# Patient Record
Sex: Male | Born: 1940 | Race: White | Hispanic: No | Marital: Married | Smoking: Former smoker
Health system: Southern US, Community
[De-identification: ages and names within clinical notes are randomized; demographics above are authoritative.]

## PROBLEM LIST (undated history)

## (undated) DIAGNOSIS — I10 Essential (primary) hypertension: Secondary | ICD-10-CM

## (undated) DIAGNOSIS — R112 Nausea with vomiting, unspecified: Secondary | ICD-10-CM

## (undated) DIAGNOSIS — Z9889 Other specified postprocedural states: Secondary | ICD-10-CM

## (undated) DIAGNOSIS — K828 Other specified diseases of gallbladder: Secondary | ICD-10-CM

## (undated) DIAGNOSIS — K219 Gastro-esophageal reflux disease without esophagitis: Secondary | ICD-10-CM

## (undated) DIAGNOSIS — Z87442 Personal history of urinary calculi: Secondary | ICD-10-CM

## (undated) DIAGNOSIS — E785 Hyperlipidemia, unspecified: Secondary | ICD-10-CM

## (undated) DIAGNOSIS — E860 Dehydration: Secondary | ICD-10-CM

## (undated) DIAGNOSIS — R0600 Dyspnea, unspecified: Secondary | ICD-10-CM

## (undated) DIAGNOSIS — R109 Unspecified abdominal pain: Secondary | ICD-10-CM

## (undated) DIAGNOSIS — E0500 Thyrotoxicosis with diffuse goiter without thyrotoxic crisis or storm: Secondary | ICD-10-CM

## (undated) DIAGNOSIS — K635 Polyp of colon: Secondary | ICD-10-CM

## (undated) DIAGNOSIS — J449 Chronic obstructive pulmonary disease, unspecified: Secondary | ICD-10-CM

## (undated) DIAGNOSIS — K5900 Constipation, unspecified: Secondary | ICD-10-CM

## (undated) DIAGNOSIS — D696 Thrombocytopenia, unspecified: Secondary | ICD-10-CM

## (undated) DIAGNOSIS — R42 Dizziness and giddiness: Secondary | ICD-10-CM

## (undated) DIAGNOSIS — M1990 Unspecified osteoarthritis, unspecified site: Secondary | ICD-10-CM

## (undated) DIAGNOSIS — A159 Respiratory tuberculosis unspecified: Secondary | ICD-10-CM

## (undated) DIAGNOSIS — M549 Dorsalgia, unspecified: Secondary | ICD-10-CM

## (undated) DIAGNOSIS — N529 Male erectile dysfunction, unspecified: Secondary | ICD-10-CM

## (undated) HISTORY — DX: Thrombocytopenia, unspecified: D69.6

## (undated) HISTORY — DX: Hyperlipidemia, unspecified: E78.5

## (undated) HISTORY — DX: Respiratory tuberculosis unspecified: A15.9

## (undated) HISTORY — PX: UPPER GASTROINTESTINAL ENDOSCOPY: SHX188

## (undated) HISTORY — DX: Male erectile dysfunction, unspecified: N52.9

## (undated) HISTORY — PX: EYE SURGERY: SHX253

## (undated) HISTORY — PX: CATARACT EXTRACTION: SUR2

## (undated) HISTORY — DX: Polyp of colon: K63.5

## (undated) HISTORY — DX: Essential (primary) hypertension: I10

## (undated) HISTORY — DX: Dorsalgia, unspecified: M54.9

---

## 1997-12-10 ENCOUNTER — Encounter: Admission: RE | Admit: 1997-12-10 | Discharge: 1998-03-10 | Payer: Self-pay | Admitting: Neurosurgery

## 1999-06-07 HISTORY — PX: SPINAL FUSION: SHX223

## 2001-07-10 ENCOUNTER — Ambulatory Visit (HOSPITAL_COMMUNITY): Admission: RE | Admit: 2001-07-10 | Discharge: 2001-07-10 | Payer: Self-pay | Admitting: Neurosurgery

## 2001-08-06 ENCOUNTER — Observation Stay (HOSPITAL_COMMUNITY): Admission: RE | Admit: 2001-08-06 | Discharge: 2001-08-07 | Payer: Self-pay | Admitting: Neurosurgery

## 2009-06-06 DIAGNOSIS — K635 Polyp of colon: Secondary | ICD-10-CM

## 2009-06-06 HISTORY — DX: Polyp of colon: K63.5

## 2009-11-11 ENCOUNTER — Encounter (INDEPENDENT_AMBULATORY_CARE_PROVIDER_SITE_OTHER): Payer: Self-pay | Admitting: *Deleted

## 2010-02-11 ENCOUNTER — Ambulatory Visit: Payer: Self-pay | Admitting: Gastroenterology

## 2010-02-11 DIAGNOSIS — I10 Essential (primary) hypertension: Secondary | ICD-10-CM | POA: Insufficient documentation

## 2010-03-06 HISTORY — PX: COLONOSCOPY: SHX174

## 2010-03-09 ENCOUNTER — Ambulatory Visit: Payer: Self-pay | Admitting: Gastroenterology

## 2010-03-15 ENCOUNTER — Encounter: Payer: Self-pay | Admitting: Gastroenterology

## 2010-07-06 NOTE — Assessment & Plan Note (Signed)
Summary: SCREEN FOR RECALL COLON/YF   History of Present Illness Visit Type: Initial Consult Primary GI MD: Melvia Heaps MD [facility] Primary Provider: Rudi Heap, MD Requesting Provider: Rudi Heap, MD Chief Complaint: Colon screening, family hx of colon cancer History of Present Illness:   [name] is a pleasant 70 year old white male referred at the request of Dr. Christell Constant for colonoscopy.  Family history is pertinent for his sister who developed colon cancer at 65.  Screening colonoscopy in 2004 demonstrated few right colon diverticula.  He has no GI complaints including abdominal pain, change of bowel habits, melena or hematochezia.   GI Review of Systems      Denies abdominal pain, acid reflux, belching, bloating, chest pain, dysphagia with liquids, dysphagia with solids, heartburn, loss of appetite, nausea, vomiting, vomiting blood, weight loss, and  weight gain.        Denies anal fissure, black tarry stools, change in bowel habit, constipation, diarrhea, diverticulosis, fecal incontinence, heme positive stool, hemorrhoids, irritable bowel syndrome, jaundice, light color stool, liver problems, rectal bleeding, and  rectal pain. Preventive Screening-Counseling & Management  Alcohol-Tobacco     Smoking Status: quit      Drug Use:  no.      Current Medications (verified): 1)  Diovan 80 Mg Tabs (Valsartan) .... Once Daily 2)  Crestor 10 Mg Tabs (Rosuvastatin Calcium) .... Once Daily 3)  Aspirin 81 Mg Tbec (Aspirin) .... Once Daily 4)  Vitamin D3 2000 Unit Caps (Cholecalciferol) .... Take 2500 International Units Daily 5)  Coq10 100 Mg Caps (Coenzyme Q10) .... Once Daily 6)  Fish Oil 1000 Mg Caps (Omega-3 Fatty Acids) .... Once Daily  Allergies (verified): 1)  ! Hydrocodone 2)  ! Codeine  Past History:  Past Medical History: Hyperlipidemia Hypertension Kidney Stones Pneumonia  Past Surgical History: Neck fusion x2  Family History: Family History of Colon  Cancer:Sister Family History of Prostate Cancer:Father, Brother  Social History: Occupation: [employer] Patient is a former smoker.  Alcohol Use - no Daily Caffeine Use Illicit Drug Use - no Smoking Status:  quit Drug Use:  no  Review of Systems       The patient complains of hearing problems.  The patient denies allergy/sinus, anemia, anxiety-new, arthritis/joint pain, back pain, blood in urine, breast changes/lumps, change in vision, confusion, cough, coughing up blood, depression-new, fainting, fatigue, fever, headaches-new, heart murmur, heart rhythm changes, itching, menstrual pain, muscle pains/cramps, night sweats, nosebleeds, pregnancy symptoms, shortness of breath, skin rash, sleeping problems, sore throat, swelling of feet/legs, swollen lymph glands, thirst - excessive , urination - excessive , urination changes/pain, urine leakage, vision changes, and voice change.         All other systems were reviewed and were negative   Vital Signs:  Patient profile:   70 year old male Height:      58 inches Weight:      171.50 pounds BMI:     35.97 Pulse rate:   68 / minute Pulse rhythm:   regular BP sitting:   124 / 80  (left arm) Cuff size:   regular  Vitals Entered By: June McMurray CMA Duncan Dull) (February 11, 2010 1:44 PM)  Physical Exam  Additional Exam:  On physical exam he is a well-developed well-nourished male  skin: anicteric HEENT: normocephalic; PEERLA; no nasal or pharyngeal abnormalities neck: supple nodes: no cervical lymphadenopathy chest: clear to ausculatation and percussion heart: no murmurs, gallops, or rubs abd: soft, nontender; BS normoactive; no abdominal masses, tenderness, organomegaly rectal:  deferred ext: no cynanosis, clubbing, edema skeletal: no deformities neuro: oriented x 3; no focal abnormalities    Impression & Recommendations:  Problem # 1:  FM HX MALIGNANT NEOPLASM GASTROINTESTINAL TRACT (ICD-V16.0)  Plan colonoscopy  Risks,  alternatives, and complications of the procedure, including bleeding, perforation, and possible need for surgery, were explained to the patient.  Patient's questions were answered.  Orders: Colonoscopy (Colon)  Problem # 2:  ESSENTIAL HYPERTENSION, BENIGN (ICD-401.1) Assessment: Comment Only  Orders: Colonoscopy (Colon)  Patient Instructions: 1)  Copy sent to : Rudi Heap, MD 2)  Your Colonoscopy is scheduled for 03/09/2010 at 3:30pm 3)  You can pick up your MoviPrep at your pharmacy 4)  Colonoscopy and Flexible Sigmoidoscopy brochure given.  5)  Conscious Sedation brochure given.  6)  The medication list was reviewed and reconciled.  All changed / newly prescribed medications were explained.  A complete medication list was provided to the patient / caregiver. Prescriptions: MOVIPREP 100 GM  SOLR (PEG-KCL-NACL-NASULF-NA ASC-C) As per prep instructions.  #1 x 0   Entered by:   Merri Ray CMA (AAMA)   Authorized by:   Louis Meckel MD   Signed by:   Merri Ray CMA (AAMA) on 02/11/2010   Method used:   Electronically to        [phone]* (retail)       [address]       [place], [place]  [address]       Ph: [phone] or [phone]       Fax: [phone]   RxID:   [id number]

## 2010-07-06 NOTE — Letter (Signed)
Summary: New Patient letter  [facility]  [address] [place], [place] [address]   Phone: [phone]  Fax: [phone]       11/11/2009 MRN: [id number]  Mario Smith [address] [place], [place]  [address]  Dear Mr. KIMES,  Welcome to the Gastroenterology Division at [facility].    You are scheduled to see Dr.  Arlyce Dice on 12-28-09 at 2pm on the 3rd floor at [facility], [address]  We ask that you try to arrive at our office 15 minutes prior to your appointment time to allow for check-in.  We would like you to complete the enclosed self-administered evaluation form prior to your visit and bring it with you on the day of your appointment.  We will review it with you.  Also, please bring a complete list of all your medications or, if you prefer, bring the medication bottles and we will list them.  Please bring your insurance card so that we may make a copy of it.  If your insurance requires a referral to see a specialist, please bring your referral form from your primary care physician.  Co-payments are due at the time of your visit and may be paid by cash, check or credit card.     Your office visit will consist of a consult with your physician (includes a physical exam), any laboratory testing he/she may order, scheduling of any necessary diagnostic testing (e.g. x-ray, ultrasound, CT-scan), and scheduling of a procedure (e.g. Endoscopy, Colonoscopy) if required.  Please allow enough time on your schedule to allow for any/all of these possibilities.    If you cannot keep your appointment, please call [phone] to cancel or reschedule prior to your appointment date.  This allows [place] the opportunity to schedule an appointment for another patient in need of care.  If you do not cancel or reschedule by 5 p.m. the business day prior to your appointment date, you will be charged a $50.00 late cancellation/no-show fee.    Thank you for choosing [facility] for your medical needs.  We appreciate the opportunity to care for you.  Please visit [place] at our website  to learn more about our practice.                     Sincerely,                                                             The Gastroenterology Division

## 2010-07-06 NOTE — Procedures (Signed)
Summary: Colonoscopy  Patient: Abas Menken Note: All result statuses are Final unless otherwise noted.  Tests: (1) Colonoscopy (COL)   COL Colonoscopy           DONE     [facility]     [address]     [place], [place]  [address]           COLONOSCOPY PROCEDURE REPORT           PATIENT:  Mario Smith, Mario Smith  MR#:  [id number]     BIRTHDATE:  12/25/1940, 69 yrs. old  GENDER:  male           ENDOSCOPIST:  Barbette Hair. Arlyce Dice, MD     Referred by:           PROCEDURE DATE:  03/09/2010     PROCEDURE:  Colonoscopy with snare polypectomy     ASA CLASS:  Class I     INDICATIONS:  1) Elevated Risk Screening  2) family history of     colon cancer Sister           MEDICATIONS:   Fentanyl 50 mcg IV, Versed 5 mg IV           DESCRIPTION OF PROCEDURE:   After the risks benefits and     alternatives of the procedure were thoroughly explained, informed     consent was obtained.  Digital rectal exam was performed and     revealed no abnormalities.   The LB CF-[id number] endoscope     was introduced through the anus and advanced to the cecum, which     was identified by the ileocecal valve, without limitations.  The     quality of the prep was excellent, using MiraLax.  The instrument     was then slowly withdrawn as the colon was fully examined.     <<PROCEDUREIMAGES>>           FINDINGS:  A sessile polyp was found in the descending colon. It     was 5 mm in size. Polyp was snared without cautery. Retrieval was     successful (see image11). snare polyp  This was otherwise a normal     examination of the colon (see image1, image2, image3, image4,     image5, image8, image10, image14, and image16).   Retroflexed     views in the rectum revealed no abnormalities.    The time to     cecum =  1.25  minutes. The scope was then withdrawn (time =  7.75     min) from the patient and the procedure completed.           COMPLICATIONS:  None           ENDOSCOPIC IMPRESSION:     1) 5 mm sessile  polyp in the descending colon     2) Otherwise normal examination     RECOMMENDATIONS:     1) Given your significant family history of colon cancer, you     should have a repeat colonoscopy in 5 years           REPEAT EXAM:  In 5 year(s) for Colonoscopy.           ______________________________     Barbette Hair. Arlyce Dice, MD           CC: Rudi Heap, MD           n.     Rosalie Doctor:   Barbette Hair.  Kaplan at 03/09/2010 04:02 PM           Kassem, Alba, [id number]  Note: An exclamation mark (!) indicates a result that was not dispersed into the flowsheet. Document Creation Date: 03/09/2010 4:04 PM _______________________________________________________________________  (1) Order result status: Final Collection or observation date-time: 03/09/2010 15:56 Requested date-time:  Receipt date-time:  Reported date-time:  Referring Physician:   Ordering Physician: Melvia Heaps [phone]) Specimen Source:  Source: Launa Grill Order Number: [id number] Lab site:   Appended Document: Colonoscopy     Procedures Next Due Date:    Colonoscopy: 03/2015

## 2010-07-06 NOTE — Letter (Signed)
Summary: Patient Notice- Polyp Results  [facility]  [address] [place], [place] [address]   Phone: [phone]  Fax: [phone]        March 15, 2010 MRN: [id number]    Mario Smith [address] [place], [place]  [address]    Dear Mr. SOLER,  I am pleased to inform you that the colon polyp(s) removed during your recent colonoscopy was (were) found to be benign (no cancer detected) upon pathologic examination.  I recommend you have a repeat colonoscopy examination in _5 years to look for recurrent polyps, as having colon polyps increases your risk for having recurrent polyps or even colon cancer in the future.  Should you develop new or worsening symptoms of abdominal pain, bowel habit changes or bleeding from the rectum or bowels, please schedule an evaluation with either your primary care physician or with me.  Additional information/recommendations:  __ No further action with gastroenterology is needed at this time. Please      follow-up with your primary care physician for your other healthcare      needs.  __ Please call [phone] to schedule a return visit to review your      situation.  __ Please keep your follow-up visit as already scheduled.  _x_ Continue treatment plan as outlined the day of your exam.  Please call us if you are having persistent problems or have questions about your condition that have not been fully answered at this time.  Sincerely,  Louis Meckel MD  This letter has been electronically signed by your physician.  Appended Document: Patient Notice- Polyp Results letter mailed

## 2010-07-06 NOTE — Letter (Signed)
Summary: [facility] Instructions  [facility]  [address] [place], [place] [address]   Phone: [phone]  Fax: [phone]       Mario Smith    1940-12-16    MRN: [id number]        Procedure Day /Date:TUESDAY 03/09/2010     Arrival Time:2:30PM     Procedure Time:3:30PM     Location of Procedure:                    X   [facility] (4th Floor)                       PREPARATION FOR COLONOSCOPY WITH MOVIPREP   Starting 5 days prior to your procedure 9/30/2011do not eat nuts, seeds, popcorn, corn, beans, peas,  salads, or any raw vegetables.  Do not take any fiber supplements (e.g. Metamucil, Citrucel, and Benefiber).  THE DAY BEFORE YOUR PROCEDURE         DATE:03/08/2010 DAY: MONDAY  1.  Drink clear liquids the entire day-NO SOLID FOOD  2.  Do not drink anything colored red or purple.  Avoid juices with pulp.  No orange juice.  3.  Drink at least 64 oz. (8 glasses) of fluid/clear liquids during the day to prevent dehydration and help the prep work efficiently.  CLEAR LIQUIDS INCLUDE: Water Jello Ice Popsicles Tea (sugar ok, no milk/cream) Powdered fruit flavored drinks Coffee (sugar ok, no milk/cream) Gatorade Juice: apple, white grape, white cranberry  Lemonade Clear bullion, consomm, broth Carbonated beverages (any kind) Strained chicken noodle soup Hard Candy                             4.  In the morning, mix first dose of MoviPrep solution:    Empty 1 Pouch A and 1 Pouch B into the disposable container    Add lukewarm drinking water to the top line of the container. Mix to dissolve    Refrigerate (mixed solution should be used within 24 hrs)  5.  Begin drinking the prep at 5:00 p.m. The MoviPrep container is divided by 4 marks.   Every 15 minutes drink the solution down to the next mark (approximately 8 oz) until the full liter is complete.   6.  Follow completed prep with 16 oz of clear liquid of your choice (Nothing red or  purple).  Continue to drink clear liquids until bedtime.  7.  Before going to bed, mix second dose of MoviPrep solution:    Empty 1 Pouch A and 1 Pouch B into the disposable container    Add lukewarm drinking water to the top line of the container. Mix to dissolve    Refrigerate  THE DAY OF YOUR PROCEDURE      DATE: 03/09/2010 [id number]  Beginning at10:30a.m. (5 hours before procedure):         1. Every 15 minutes, drink the solution down to the next mark (approx 8 oz) until the full liter is complete.  2. Follow completed prep with 16 oz. of clear liquid of your choice.    3. You may drink clear liquids until 1:30PM (2 HOURS BEFORE PROCEDURE).   MEDICATION INSTRUCTIONS  Unless otherwise instructed, you should take regular prescription medications with a small sip of water   as early as possible the morning of your procedure.         OTHER INSTRUCTIONS  You will need a responsible adult at least 71 years of age to accompany you and drive you home.   This person must remain in the waiting room during your procedure.  Wear loose fitting clothing that is easily removed.  Leave jewelry and other valuables at home.  However, you may wish to bring a book to read or  an iPod/MP3 player to listen to music as you wait for your procedure to start.  Remove all body piercing jewelry and leave at home.  Total time from sign-in until discharge is approximately 2-3 hours.  You should go home directly after your procedure and rest.  You can resume normal activities the  day after your procedure.  The day of your procedure you should not:   Drive   Make legal decisions   Operate machinery   Drink alcohol   Return to work  You will receive specific instructions about eating, activities and medications before you leave.    The above instructions have been reviewed and explained to me by   _______________________    I fully understand and can verbalize these  instructions _____________________________ Date _________

## 2010-09-09 ENCOUNTER — Encounter: Payer: Self-pay | Admitting: Family Medicine

## 2010-09-29 ENCOUNTER — Other Ambulatory Visit: Payer: Self-pay | Admitting: Family Medicine

## 2010-09-29 DIAGNOSIS — R591 Generalized enlarged lymph nodes: Secondary | ICD-10-CM

## 2010-09-29 DIAGNOSIS — R599 Enlarged lymph nodes, unspecified: Secondary | ICD-10-CM

## 2010-10-01 ENCOUNTER — Ambulatory Visit
Admission: RE | Admit: 2010-10-01 | Discharge: 2010-10-01 | Disposition: A | Discharge status: Home / Self Care | Payer: Medicare Other | Source: Ambulatory Visit | Attending: Family Medicine | Admitting: Family Medicine

## 2010-10-01 DIAGNOSIS — R599 Enlarged lymph nodes, unspecified: Secondary | ICD-10-CM

## 2010-10-01 DIAGNOSIS — R591 Generalized enlarged lymph nodes: Secondary | ICD-10-CM

## 2010-10-01 MED ORDER — IOHEXOL 300 MG/ML  SOLN
75.0000 mL | Freq: Once | INTRAMUSCULAR | Status: AC | PRN
  Administered 2010-10-01: 75 mL via INTRAVENOUS

## 2012-10-01 ENCOUNTER — Other Ambulatory Visit (INDEPENDENT_AMBULATORY_CARE_PROVIDER_SITE_OTHER): Payer: Medicare Other

## 2012-10-01 DIAGNOSIS — R972 Elevated prostate specific antigen [PSA]: Secondary | ICD-10-CM

## 2012-10-01 LAB — PSA: PSA: 1.96 ng/mL (ref <=4.00)

## 2012-10-01 NOTE — Progress Notes (Signed)
Patient came in for blood work for Dr.Wrenn

## 2012-10-09 ENCOUNTER — Telehealth: Payer: Self-pay | Admitting: Family Medicine

## 2012-10-09 MED ORDER — ROSUVASTATIN CALCIUM 20 MG PO TABS: 10.0000 mg | ORAL_TABLET | Freq: Every day | ORAL | Status: DC

## 2012-10-09 NOTE — Telephone Encounter (Signed)
Samples up front and patient aware 

## 2012-11-30 ENCOUNTER — Telehealth: Payer: Self-pay | Admitting: Family Medicine

## 2012-11-30 MED ORDER — ROSUVASTATIN CALCIUM 20 MG PO TABS: 10.0000 mg | ORAL_TABLET | Freq: Every day | ORAL | Status: DC

## 2012-11-30 NOTE — Telephone Encounter (Signed)
Pt notified samples crestor @front desk 

## 2012-12-12 ENCOUNTER — Other Ambulatory Visit (INDEPENDENT_AMBULATORY_CARE_PROVIDER_SITE_OTHER): Payer: Medicare Other

## 2012-12-12 DIAGNOSIS — Z79899 Other long term (current) drug therapy: Secondary | ICD-10-CM

## 2012-12-12 DIAGNOSIS — R5381 Other malaise: Secondary | ICD-10-CM

## 2012-12-12 DIAGNOSIS — E559 Vitamin D deficiency, unspecified: Secondary | ICD-10-CM

## 2012-12-12 DIAGNOSIS — E785 Hyperlipidemia, unspecified: Secondary | ICD-10-CM

## 2012-12-12 DIAGNOSIS — R5383 Other fatigue: Secondary | ICD-10-CM

## 2012-12-12 LAB — BASIC METABOLIC PANEL WITH GFR
Chloride: 106 mEq/L (ref 96–112)
GFR, Est African American: 81 mL/min
GFR, Est Non African American: 70 mL/min
Potassium: 4.8 mEq/L (ref 3.5–5.3)
Sodium: 139 mEq/L (ref 135–145)

## 2012-12-12 LAB — POCT CBC
Granulocyte percent: 58.5 %G (ref 37–80)
POC Granulocyte: 3.4 (ref 2–6.9)
POC LYMPH PERCENT: 32.1 %L (ref 10–50)
Platelet Count, POC: 274 10*3/uL (ref 142–424)
RBC: 4.7 M/uL (ref 4.69–6.13)
RDW, POC: 13.6 %

## 2012-12-12 LAB — HEPATIC FUNCTION PANEL
Albumin: 4.2 g/dL (ref 3.5–5.2)
Alkaline Phosphatase: 56 U/L (ref 39–117)
Total Protein: 6.4 g/dL (ref 6.0–8.3)

## 2012-12-13 LAB — NMR LIPOPROFILE WITH LIPIDS
HDL Size: 9.1 nm — ABNORMAL LOW (ref >=9.2)
HDL-C: 34 mg/dL — ABNORMAL LOW (ref >=40)
LDL (calc): 68 mg/dL (ref <100)
LDL Particle Number: 903 nmol/L (ref <1000)
LDL Size: 19.9 nm — ABNORMAL LOW (ref >20.5)
LP-IR Score: 52 — ABNORMAL HIGH (ref <=45)
Triglycerides: 73 mg/dL (ref <150)
VLDL Size: 49.2 nm — ABNORMAL HIGH (ref <=46.6)

## 2012-12-13 LAB — VITAMIN D 25 HYDROXY (VIT D DEFICIENCY, FRACTURES): Vit D, 25-Hydroxy: 62 ng/mL (ref 30–89)

## 2012-12-19 ENCOUNTER — Ambulatory Visit (INDEPENDENT_AMBULATORY_CARE_PROVIDER_SITE_OTHER): Payer: Medicare Other | Admitting: Family Medicine

## 2012-12-19 ENCOUNTER — Encounter: Payer: Self-pay | Admitting: Family Medicine

## 2012-12-19 VITALS — BP 152/77 | HR 59 | Temp 98.0°F | Ht 68.0 in | Wt 173.0 lb

## 2012-12-19 DIAGNOSIS — E559 Vitamin D deficiency, unspecified: Secondary | ICD-10-CM

## 2012-12-19 DIAGNOSIS — E785 Hyperlipidemia, unspecified: Secondary | ICD-10-CM

## 2012-12-19 DIAGNOSIS — I10 Essential (primary) hypertension: Secondary | ICD-10-CM

## 2012-12-19 DIAGNOSIS — R5381 Other malaise: Secondary | ICD-10-CM

## 2012-12-19 DIAGNOSIS — R5383 Other fatigue: Secondary | ICD-10-CM

## 2012-12-19 NOTE — Patient Instructions (Addendum)
Fall precautions discussed Continue current meds and therapeutic lifestyle changes 

## 2012-12-19 NOTE — Progress Notes (Signed)
  Subjective:    Patient ID: Mario Smith, male    DOB: 04/29/1941, 72 y.o.   MRN: [id number]  HPI Patient returns to clinic today for followup of chronic medical problems which include hypertension, hyperlipidemia. As of note the patient has had some elevated PSAs which have subsequently returned back to a more normal range. He saw Dr. Annabell Howells, and he has released him. His home blood pressure readings both morning and evening are all good and I will be scanned into the chart. Patient does complain of some edema day he has had for years which is always worse in the summertime. He also complains of an occasional shaky spell in the morning which seems to be relieved by eating.   Review of Systems  Constitutional: Positive for fatigue (some).  HENT: Positive for postnasal drip (occasional due to allergies).   Eyes: Positive for itching (due to allergies).  Respiratory: Positive for cough (slight) and shortness of breath (occasional with exertion).   Cardiovascular: Positive for leg swelling. Negative for chest pain.  Gastrointestinal: Negative.   Endocrine: Negative.   Musculoskeletal: Positive for back pain (LBP) and arthralgias (L shoulder, R hip).  Skin: Negative.   Allergic/Immunologic: Positive for environmental allergies (seasonal).  Neurological: Positive for headaches (occasional). Negative for dizziness.  Psychiatric/Behavioral: Negative.        Objective:   Physical Exam BP 152/77  Pulse 59  Temp(Src) 98 F (36.7 C) (Oral)  Ht 5\' 8"  (1.727 m)  Wt 173 lb (78.472 kg)  BMI 26.31 kg/m2  The patient appeared well nourished and normally developed, alert and oriented to time and place. Speech, behavior and judgement appear normal. Vital signs as documented.  Head exam is unremarkable. No scleral icterus or pallor noted. There is nasal congestion bilaterally. Mouth, throat and ears were within normal limit. Neck is without jugular venous distension, thyromegally, or carotid bruits.  Carotid upstrokes are brisk bilaterally. No cervical adenopathy. Lungs are clear anteriorly and posteriorly to auscultation. Normal respiratory effort. Cardiac exam reveals regular rate and rhythm at 72 per minute. First and second heart sounds normal.  No murmurs, rubs or gallops.  Abdominal exam reveals normal bowl sounds, no masses, no organomegaly and no aortic enlargement. No inguinal adenopathy. Extremities are nonedematous, except slightly above the sock line, and both femoral and pedal pulses are normal. Skin without pallor or jaundice.  Warm and dry, without rash. Neurologic exam reveals normal deep tendon reflexes and normal sensation.          Assessment & Plan:  1. Hyperlipemia  2. Hypertension  3. Fatigue  4. Vitamin D deficiency  We will continue to monitor the edema. We will also continue to watch these shaky spells that you have in the morning  Check weights regularly  Patient Instructions  Fall precautions discussed Continue current meds and therapeutic lifestyle changes   Nyra Capes MD

## 2013-02-18 ENCOUNTER — Telehealth: Payer: Self-pay | Admitting: Family Medicine

## 2013-02-19 NOTE — Telephone Encounter (Signed)
Patient is aware samples up front

## 2013-03-25 ENCOUNTER — Other Ambulatory Visit: Payer: Self-pay | Admitting: Family Medicine

## 2013-03-27 ENCOUNTER — Ambulatory Visit (INDEPENDENT_AMBULATORY_CARE_PROVIDER_SITE_OTHER): Payer: Medicare Other | Admitting: *Deleted

## 2013-03-27 DIAGNOSIS — Z23 Encounter for immunization: Secondary | ICD-10-CM

## 2013-04-19 ENCOUNTER — Telehealth: Payer: Self-pay | Admitting: Family Medicine

## 2013-04-22 MED ORDER — ROSUVASTATIN CALCIUM 20 MG PO TABS: 10.0000 mg | ORAL_TABLET | Freq: Every day | ORAL | Status: DC

## 2013-04-22 NOTE — Telephone Encounter (Signed)
Left message that samples are available up front.

## 2013-05-20 ENCOUNTER — Other Ambulatory Visit (INDEPENDENT_AMBULATORY_CARE_PROVIDER_SITE_OTHER): Payer: Medicare Other

## 2013-05-20 DIAGNOSIS — E559 Vitamin D deficiency, unspecified: Secondary | ICD-10-CM

## 2013-05-20 DIAGNOSIS — R5381 Other malaise: Secondary | ICD-10-CM

## 2013-05-20 DIAGNOSIS — E785 Hyperlipidemia, unspecified: Secondary | ICD-10-CM

## 2013-05-20 DIAGNOSIS — I10 Essential (primary) hypertension: Secondary | ICD-10-CM

## 2013-05-20 LAB — POCT CBC
Hemoglobin: 15 g/dL (ref 14.1–18.1)
Lymph, poc: 2.1 (ref 0.6–3.4)
MCH, POC: 30.5 pg (ref 27–31.2)
MCHC: 32.5 g/dL (ref 31.8–35.4)
MPV: 9.4 fL (ref 0–99.8)
POC LYMPH PERCENT: 40.1 %L (ref 10–50)
RDW, POC: 13.8 %
WBC: 5.3 10*3/uL (ref 4.6–10.2)

## 2013-05-20 NOTE — Progress Notes (Signed)
Patient came in for labs only.

## 2013-05-22 LAB — BMP8+EGFR
Calcium: 9.3 mg/dL (ref 8.6–10.2)
GFR calc Af Amer: 77 mL/min/{1.73_m2} (ref >59)
GFR calc non Af Amer: 67 mL/min/{1.73_m2} (ref >59)
Potassium: 5.2 mmol/L (ref 3.5–5.2)
Sodium: 145 mmol/L — ABNORMAL HIGH (ref 134–144)

## 2013-05-22 LAB — NMR, LIPOPROFILE
HDL Cholesterol by NMR: 43 mg/dL (ref >=40)
LDL Particle Number: 1136 nmol/L — ABNORMAL HIGH (ref <1000)
Triglycerides by NMR: 66 mg/dL (ref <150)

## 2013-05-22 LAB — HEPATITIS PANEL, ACUTE: Hep C Virus Ab: <0.1 s/co ratio (ref 0.0–0.9)

## 2013-05-23 LAB — HFP7+3AC
Albumin: 4.2 g/dL (ref 3.5–4.8)
Alkaline Phosphatase: 77 IU/L (ref 39–117)
Bilirubin, Direct: 0.11 mg/dL (ref 0.00–0.40)
Total Protein: 6.2 g/dL (ref 6.0–8.5)

## 2013-05-27 ENCOUNTER — Encounter: Payer: Self-pay | Admitting: Family Medicine

## 2013-05-27 ENCOUNTER — Ambulatory Visit (INDEPENDENT_AMBULATORY_CARE_PROVIDER_SITE_OTHER): Payer: Medicare Other

## 2013-05-27 ENCOUNTER — Ambulatory Visit (INDEPENDENT_AMBULATORY_CARE_PROVIDER_SITE_OTHER): Payer: Medicare Other | Admitting: Family Medicine

## 2013-05-27 VITALS — BP 147/74 | HR 76 | Temp 98.1°F | Ht 68.0 in | Wt 172.0 lb

## 2013-05-27 DIAGNOSIS — Z23 Encounter for immunization: Secondary | ICD-10-CM

## 2013-05-27 DIAGNOSIS — I10 Essential (primary) hypertension: Secondary | ICD-10-CM

## 2013-05-27 DIAGNOSIS — R972 Elevated prostate specific antigen [PSA]: Secondary | ICD-10-CM

## 2013-05-27 DIAGNOSIS — E559 Vitamin D deficiency, unspecified: Secondary | ICD-10-CM

## 2013-05-27 DIAGNOSIS — E785 Hyperlipidemia, unspecified: Secondary | ICD-10-CM

## 2013-05-27 NOTE — Progress Notes (Signed)
Subjective:    Patient ID: Mario Smith, male    DOB: 10-Jan-1941, 72 y.o.   MRN: [id number]  HPI Pt here for follow up and management of chronic medical problems. His health maintenance parameters are up to date except for getting a Prevnar vaccine. His labs will be reviewed with him today. He will be given FOBT to return in January. He'll also get a chest x-ray today.        Patient Active Problem List   Diagnosis Date Noted  . Hypertension 02/11/2010   Outpatient Encounter Prescriptions as of 05/27/2013  Medication Sig  . aspirin 81 MG EC tablet Take 81 mg by mouth daily.    . Cholecalciferol (VITAMIN D3) 2000 UNITS TABS Take 1 tablet by mouth daily.    . Coenzyme Q10 (CO Q 10 PO) Take 1 capsule by mouth daily.    Marland Kitchen losartan (COZAAR) 100 MG tablet TAKE 1 TABLET BY MOUTH EVERY DAY AS DIRECTED  . Omega-3 Fatty Acids (FISH OIL) 1000 MG CAPS Take 1 capsule by mouth 2 (two) times daily.    . rosuvastatin (CRESTOR) 20 MG tablet Take 0.5 tablets (10 mg total) by mouth daily.    Review of Systems  Constitutional: Negative.   HENT: Negative.   Eyes: Negative.   Respiratory: Negative.   Cardiovascular: Negative.   Gastrointestinal: Negative.   Endocrine: Negative.   Genitourinary: Negative.   Musculoskeletal: Negative.   Skin: Negative.   Allergic/Immunologic: Negative.   Neurological: Negative.   Hematological: Negative.   Psychiatric/Behavioral: Negative.        Objective:   Physical Exam  Nursing note and vitals reviewed. Constitutional: He is oriented to person, place, and time. He appears well-developed and well-nourished. No distress.  HENT:  Head: Normocephalic and atraumatic.  Right Ear: External ear normal.  Left Ear: External ear normal.  Nose: Nose normal.  Mouth/Throat: Oropharynx is clear and moist. No oropharyngeal exudate.  Some nasal congestion bilaterally  Eyes: Conjunctivae and EOM are normal. Pupils are equal, round, and reactive to light. Right eye  exhibits no discharge. Left eye exhibits no discharge. No scleral icterus.  Neck: Normal range of motion. Neck supple. No tracheal deviation present. No thyromegaly present.  Cardiovascular: Normal rate, regular rhythm, normal heart sounds and intact distal pulses.  Exam reveals no gallop and no friction rub.   No murmur heard. At 72 per minute  Pulmonary/Chest: Effort normal and breath sounds normal. No respiratory distress. He has no wheezes. He has no rales. He exhibits no tenderness.  Abdominal: Soft. Bowel sounds are normal. He exhibits no mass. There is no tenderness. There is no rebound and no guarding.  Musculoskeletal: Normal range of motion. He exhibits no edema and no tenderness.  Lymphadenopathy:    He has no cervical adenopathy.  Neurological: He is alert and oriented to person, place, and time. He has normal reflexes. No cranial nerve deficit.  Skin: Skin is warm and dry. No rash noted. No erythema. No pallor.  Psychiatric: He has a normal mood and affect. His behavior is normal. Judgment and thought content normal.   BP 147/74  Pulse 76  Temp(Src) 98.1 F (36.7 C) (Oral)  Ht 5\' 8"  (1.727 m)  Wt 172 lb (78.019 kg)  BMI 26.16 kg/m2  [facility] reading (PRIMARY) by  Dr. Christell Constant; chest x-ray--no active disease  Assessment & Plan:  1. Hypertension - DG Chest 2 View; Future  2. Hyperlipidemia  3. Vitamin D deficiency  4. Elevated PSA - PSA, total and free  Orders Placed This Encounter  Procedures  . DG Chest 2 View    Standing Status: Future     Number of Occurrences: 1     Standing Expiration Date: 07/27/2014    Order Specific Question:  Reason for Exam (SYMPTOM  OR DIAGNOSIS REQUIRED)    Answer:  htn    Order Specific Question:  Preferred imaging location?    Answer:  Internal  . PSA, total and free   No orders of the defined types were placed in this encounter.   Patient Instructions  Continue current medications. Continue  good therapeutic lifestyle changes which include good diet and exercise. Fall precautions discussed with patient. Schedule your flu vaccine if you haven't had it yet If you are over 78 years old - you may need Prevnar 13 or the adult Pneumonia vaccine. We will call you with the results of the PSA once that lab work is available. We will give you the Prevnar vaccine today and that may make her arms sore.   Nyra Capes MD

## 2013-05-27 NOTE — Patient Instructions (Addendum)
Continue current medications. Continue good therapeutic lifestyle changes which include good diet and exercise. Fall precautions discussed with patient. Schedule your flu vaccine if you haven't had it yet If you are over 73 years old - you may need Prevnar 13 or the adult Pneumonia vaccine. We will call you with the results of the PSA once that lab work is available. We will give you the Prevnar vaccine today and that may make her arms sore.

## 2013-05-27 NOTE — Addendum Note (Signed)
Addended by: Bearl Mulberry on: 05/27/2013 05:26 PM   Modules accepted: Orders

## 2013-05-28 LAB — PSA, TOTAL AND FREE
PSA, Free Pct: 18.2 %
PSA, Free: 0.4 ng/mL
PSA: 2.2 ng/mL (ref 0.0–4.0)

## 2013-06-03 ENCOUNTER — Other Ambulatory Visit: Payer: Self-pay | Admitting: Family Medicine

## 2013-06-03 ENCOUNTER — Other Ambulatory Visit: Payer: Medicare Other

## 2013-06-03 ENCOUNTER — Ambulatory Visit (INDEPENDENT_AMBULATORY_CARE_PROVIDER_SITE_OTHER): Payer: Medicare Other

## 2013-06-03 DIAGNOSIS — Z09 Encounter for follow-up examination after completed treatment for conditions other than malignant neoplasm: Secondary | ICD-10-CM

## 2013-06-03 DIAGNOSIS — I10 Essential (primary) hypertension: Secondary | ICD-10-CM

## 2013-06-12 ENCOUNTER — Telehealth: Payer: Self-pay | Admitting: Family Medicine

## 2013-06-12 NOTE — Telephone Encounter (Signed)
No samples 

## 2013-06-19 ENCOUNTER — Telehealth: Payer: Self-pay | Admitting: Family Medicine

## 2013-06-19 NOTE — Telephone Encounter (Signed)
Pt notified samples at front to pickup of crestor.

## 2013-07-02 ENCOUNTER — Other Ambulatory Visit: Payer: Self-pay | Admitting: Family Medicine

## 2013-08-14 ENCOUNTER — Telehealth: Payer: Self-pay | Admitting: Family Medicine

## 2013-08-14 MED ORDER — ROSUVASTATIN CALCIUM 20 MG PO TABS: 10.0000 mg | ORAL_TABLET | Freq: Every day | ORAL | Status: DC

## 2013-08-14 NOTE — Telephone Encounter (Signed)
Patient aware that samples are up front.  

## 2013-09-02 ENCOUNTER — Ambulatory Visit (INDEPENDENT_AMBULATORY_CARE_PROVIDER_SITE_OTHER): Payer: Medicare Other

## 2013-09-02 ENCOUNTER — Encounter: Payer: Self-pay | Admitting: General Practice

## 2013-09-02 ENCOUNTER — Ambulatory Visit (INDEPENDENT_AMBULATORY_CARE_PROVIDER_SITE_OTHER): Payer: Medicare Other | Admitting: General Practice

## 2013-09-02 VITALS — BP 163/80 | HR 80 | Temp 97.3°F | Ht 68.0 in | Wt 174.6 lb

## 2013-09-02 DIAGNOSIS — M545 Low back pain, unspecified: Secondary | ICD-10-CM

## 2013-09-02 DIAGNOSIS — R0781 Pleurodynia: Secondary | ICD-10-CM

## 2013-09-02 DIAGNOSIS — R079 Chest pain, unspecified: Secondary | ICD-10-CM

## 2013-09-02 DIAGNOSIS — IMO0002 Reserved for concepts with insufficient information to code with codable children: Secondary | ICD-10-CM

## 2013-09-02 MED ORDER — METHYLPREDNISOLONE ACETATE 80 MG/ML IJ SUSP
80.0000 mg | Freq: Once | INTRAMUSCULAR | Status: AC
  Administered 2013-09-02: 80 mg via INTRAMUSCULAR

## 2013-09-02 MED ORDER — PREDNISONE (PAK) 10 MG PO TABS: ORAL_TABLET | ORAL | Status: DC

## 2013-09-02 NOTE — Progress Notes (Signed)
   Subjective:    Patient ID: Mario Smith, male    DOB: 08-17-1940, 73 y.o.   MRN: [id number]  Back Pain This is a new problem. Episode onset: onset 1 month ago. The problem has been gradually worsening since onset. The pain is present in the lumbar spine. The quality of the pain is described as aching. The pain does not radiate. The pain is at a severity of 3/10. The symptoms are aggravated by bending. Pertinent negatives include no bladder incontinence, bowel incontinence, leg pain, numbness, tingling or weakness. He has tried NSAIDs for the symptoms.      Review of Systems  Gastrointestinal: Negative for bowel incontinence.  Genitourinary: Negative for bladder incontinence.  Musculoskeletal: Positive for back pain.  Neurological: Negative for tingling, weakness and numbness.       Objective:   Physical Exam  Constitutional: He is oriented to person, place, and time. He appears well-developed and well-nourished.  Cardiovascular: Normal rate, regular rhythm and normal heart sounds.   Pulmonary/Chest: Effort normal and breath sounds normal. No respiratory distress. He exhibits no tenderness.  Abdominal: Soft. Bowel sounds are normal. He exhibits no distension. There is no tenderness.  Musculoskeletal:  Limited range of motion, without discomfort when flexion at waist  Neurological: He is alert and oriented to person, place, and time.  Skin: Skin is warm and dry.  Psychiatric: He has a normal mood and affect.     [facility] reading (PRIMARY) by Erby Pian, FNP-C, degenerative disc changes, lumbar xray. No acute changes noted in chest xray.      Assessment & Plan:  1. Low back pain  - DG Lumbar Spine Complete; Future - methylPREDNISolone acetate (DEPO-MEDROL) injection 80 mg; Inject 1 mL (80 mg total) into the muscle once. - predniSONE (STERAPRED UNI-PAK) 10 MG tablet; Start on 09/03/13  Dispense: 21 tablet; Refill: 0  2. Rib pain on right side  - DG Chest 2 View;  Future  3. Degenerative disc disease -discussed and provided information on back pain -RTO if symptoms worsen or no improvement -Will refer to ortho -Patient verbalized understanding Erby Pian, FNP-C

## 2013-09-02 NOTE — Patient Instructions (Signed)
Back Pain, Adult Low back pain is very common. About 1 in 5 people have back pain.The cause of low back pain is rarely dangerous. The pain often gets better over time.About half of people with a sudden onset of back pain feel better in just 2 weeks. About 8 in 10 people feel better by 6 weeks.  CAUSES Some common causes of back pain include:  Strain of the muscles or ligaments supporting the spine.  Wear and tear (degeneration) of the spinal discs.  Arthritis.  Direct injury to the back. DIAGNOSIS Most of the time, the direct cause of low back pain is not known.However, back pain can be treated effectively even when the exact cause of the pain is unknown.Answering your caregiver's questions about your overall health and symptoms is one of the most accurate ways to make sure the cause of your pain is not dangerous. If your caregiver needs more information, he or she may order lab work or imaging tests (X-rays or MRIs).However, even if imaging tests show changes in your back, this usually does not require surgery. HOME CARE INSTRUCTIONS For many people, back pain returns.Since low back pain is rarely dangerous, it is often a condition that people can learn to manageon their own.   Remain active. It is stressful on the back to sit or stand in one place. Do not sit, drive, or stand in one place for more than 30 minutes at a time. Take short walks on level surfaces as soon as pain allows.Try to increase the length of time you walk each day.  Do not stay in bed.Resting more than 1 or 2 days can delay your recovery.  Do not avoid exercise or work.Your body is made to move.It is not dangerous to be active, even though your back may hurt.Your back will likely heal faster if you return to being active before your pain is gone.  Pay attention to your body when you bend and lift. Many people have less discomfortwhen lifting if they bend their knees, keep the load close to their bodies,and  avoid twisting. Often, the most comfortable positions are those that put less stress on your recovering back.  Find a comfortable position to sleep. Use a firm mattress and lie on your side with your knees slightly bent. If you lie on your back, put a pillow under your knees.  Only take over-the-counter or prescription medicines as directed by your caregiver. Over-the-counter medicines to reduce pain and inflammation are often the most helpful.Your caregiver may prescribe muscle relaxant drugs.These medicines help dull your pain so you can more quickly return to your normal activities and healthy exercise.  Put ice on the injured area.  Put ice in a plastic bag.  Place a towel between your skin and the bag.  Leave the ice on for 15-20 minutes, 03-04 times a day for the first 2 to 3 days. After that, ice and heat may be alternated to reduce pain and spasms.  Ask your caregiver about trying back exercises and gentle massage. This may be of some benefit.  Avoid feeling anxious or stressed.Stress increases muscle tension and can worsen back pain.It is important to recognize when you are anxious or stressed and learn ways to manage it.Exercise is a great option. SEEK MEDICAL CARE IF:  You have pain that is not relieved with rest or medicine.  You have pain that does not improve in 1 week.  You have new symptoms.  You are generally not feeling well. SEEK   IMMEDIATE MEDICAL CARE IF:   You have pain that radiates from your back into your legs.  You develop new bowel or bladder control problems.  You have unusual weakness or numbness in your arms or legs.  You develop nausea or vomiting.  You develop abdominal pain.  You feel faint. Document Released: 05/23/2005 Document Revised: 11/22/2011 Document Reviewed: 10/11/2010 [employer] Patient Information 2014 [employer].  

## 2013-09-18 ENCOUNTER — Other Ambulatory Visit (INDEPENDENT_AMBULATORY_CARE_PROVIDER_SITE_OTHER): Payer: Medicare Other

## 2013-09-18 DIAGNOSIS — E785 Hyperlipidemia, unspecified: Secondary | ICD-10-CM

## 2013-09-18 DIAGNOSIS — I10 Essential (primary) hypertension: Secondary | ICD-10-CM

## 2013-09-18 DIAGNOSIS — E559 Vitamin D deficiency, unspecified: Secondary | ICD-10-CM

## 2013-09-18 LAB — POCT CBC
GRANULOCYTE PERCENT: 65.6 % (ref 37–80)
HEMATOCRIT: 48 % (ref 43.5–53.7)
HEMOGLOBIN: 15.6 g/dL (ref 14.1–18.1)
Lymph, poc: 1.8 (ref 0.6–3.4)
MCH, POC: 30.2 pg (ref 27–31.2)
MCHC: 32.5 g/dL (ref 31.8–35.4)
MCV: 93 fL (ref 80–97)
MPV: 10 fL (ref 0–99.8)
POC GRANULOCYTE: 4.2 (ref 2–6.9)
POC LYMPH %: 27.8 % (ref 10–50)
Platelet Count, POC: 123 10*3/uL — AB (ref 142–424)
RBC: 5.2 M/uL (ref 4.69–6.13)
RDW, POC: 13.4 %
WBC: 6.4 10*3/uL (ref 4.6–10.2)

## 2013-09-18 NOTE — Progress Notes (Signed)
Pt came in for lab

## 2013-09-19 LAB — NMR, LIPOPROFILE
CHOLESTEROL: 136 mg/dL (ref <200)
HDL Cholesterol by NMR: 46 mg/dL (ref >=40)
HDL PARTICLE NUMBER: 30.2 umol/L — AB (ref >=30.5)
LDL PARTICLE NUMBER: 930 nmol/L (ref <1000)
LDL Size: 20.8 nm (ref >20.5)
LDLC SERPL CALC-MCNC: 75 mg/dL (ref <100)
LP-IR Score: 31 (ref <=45)
Small LDL Particle Number: 385 nmol/L (ref <=527)
TRIGLYCERIDES BY NMR: 77 mg/dL (ref <150)

## 2013-09-19 LAB — VITAMIN D 25 HYDROXY (VIT D DEFICIENCY, FRACTURES): Vit D, 25-Hydroxy: 43.1 ng/mL (ref 30.0–100.0)

## 2013-09-19 LAB — BMP8+EGFR
BUN/Creatinine Ratio: 12 (ref 10–22)
BUN: 15 mg/dL (ref 8–27)
CALCIUM: 9.8 mg/dL (ref 8.6–10.2)
CO2: 26 mmol/L (ref 18–29)
Chloride: 101 mmol/L (ref 97–108)
Creatinine, Ser: 1.22 mg/dL (ref 0.76–1.27)
GFR, EST AFRICAN AMERICAN: 68 mL/min/{1.73_m2} (ref >59)
GFR, EST NON AFRICAN AMERICAN: 58 mL/min/{1.73_m2} — AB (ref >59)
GLUCOSE: 96 mg/dL (ref 65–99)
POTASSIUM: 4.2 mmol/L (ref 3.5–5.2)
Sodium: 140 mmol/L (ref 134–144)

## 2013-09-19 LAB — HEPATIC FUNCTION PANEL
ALBUMIN: 4.2 g/dL (ref 3.5–4.8)
ALK PHOS: 64 IU/L (ref 39–117)
ALT: 12 IU/L (ref 0–44)
AST: 15 IU/L (ref 0–40)
BILIRUBIN DIRECT: 0.22 mg/dL (ref 0.00–0.40)
TOTAL PROTEIN: 6.7 g/dL (ref 6.0–8.5)
Total Bilirubin: 1 mg/dL (ref 0.0–1.2)

## 2013-09-23 ENCOUNTER — Telehealth: Payer: Self-pay | Admitting: *Deleted

## 2013-09-23 NOTE — Telephone Encounter (Signed)
Message copied by Shelbie Ammons on Mon Sep 23, 2013  4:10 PM ------      Message from: Chipper Herb      Created: Thu Sep 19, 2013  7:20 AM       Blood sugar renal and electrolytes all within normal limits except the GFR is slightly decreased      His liver function tests are within normal limit      Cholesterol numbers by advanced lipid testing are improved and excellent, continue current treatment      The vitamin D level is 43.1--- increase D3 intake 2000 Monday through Friday and 4000 on Saturday and Sunday       ------

## 2013-09-25 ENCOUNTER — Other Ambulatory Visit: Payer: Self-pay | Admitting: Family Medicine

## 2013-09-25 ENCOUNTER — Encounter: Payer: Self-pay | Admitting: Family Medicine

## 2013-09-25 ENCOUNTER — Ambulatory Visit (INDEPENDENT_AMBULATORY_CARE_PROVIDER_SITE_OTHER): Payer: Medicare Other | Admitting: Family Medicine

## 2013-09-25 VITALS — BP 155/80 | HR 69 | Temp 97.8°F | Ht 68.0 in | Wt 170.0 lb

## 2013-09-25 DIAGNOSIS — I10 Essential (primary) hypertension: Secondary | ICD-10-CM

## 2013-09-25 DIAGNOSIS — J309 Allergic rhinitis, unspecified: Secondary | ICD-10-CM

## 2013-09-25 DIAGNOSIS — E785 Hyperlipidemia, unspecified: Secondary | ICD-10-CM

## 2013-09-25 DIAGNOSIS — E559 Vitamin D deficiency, unspecified: Secondary | ICD-10-CM

## 2013-09-25 MED ORDER — ROSUVASTATIN CALCIUM 20 MG PO TABS: 20.0000 mg | ORAL_TABLET | Freq: Every day | ORAL | Status: DC

## 2013-09-25 MED ORDER — LOSARTAN POTASSIUM 100 MG PO TABS: ORAL_TABLET | ORAL | Status: DC

## 2013-09-25 NOTE — Progress Notes (Signed)
Subjective:    Patient ID: Mario Smith, male    DOB: February 05, 1941, 73 y.o.   MRN: [id number]  HPI Pt here for follow up and management of chronic medical problems. Pt's home BP were reviewed and were normal. Pt c/o some rhinitis. Pt's recent labs reviewed with pt. No questions or concerns from pt.       Patient Active Problem List   Diagnosis Date Noted  . Hyperlipidemia 05/27/2013  . Vitamin D deficiency 05/27/2013  . Hypertension 02/11/2010   Outpatient Encounter Prescriptions as of 09/25/2013  Medication Sig  . aspirin 81 MG EC tablet Take 81 mg by mouth daily.    . Cholecalciferol (VITAMIN D3) 2000 UNITS TABS Take 1 tablet by mouth daily.    . Coenzyme Q10 (CO Q 10 PO) Take 1 capsule by mouth daily.    Marland Kitchen losartan (COZAAR) 100 MG tablet TAKE 1 TABLET BY MOUTH EVERY DAY  . Omega-3 Fatty Acids (FISH OIL) 1000 MG CAPS Take 1 capsule by mouth 2 (two) times daily.    . rosuvastatin (CRESTOR) 20 MG tablet Take 1 tablet (20 mg total) by mouth daily.  . [DISCONTINUED] losartan (COZAAR) 100 MG tablet TAKE 1 TABLET BY MOUTH EVERY DAY AS DIRECTED  . [DISCONTINUED] losartan (COZAAR) 100 MG tablet TAKE 1 TABLET BY MOUTH EVERY DAY  . [DISCONTINUED] rosuvastatin (CRESTOR) 20 MG tablet Take 0.5 tablets (10 mg total) by mouth daily.  . [DISCONTINUED] rosuvastatin (CRESTOR) 20 MG tablet Take 20 mg by mouth daily.  . [DISCONTINUED] predniSONE (STERAPRED UNI-PAK) 10 MG tablet Start on 09/03/13    Review of Systems  Constitutional: Negative.   HENT: Negative.   Eyes: Negative.   Respiratory: Negative.   Cardiovascular: Negative.   Gastrointestinal: Negative.   Endocrine: Negative.   Genitourinary: Negative.   Musculoskeletal: Negative.   Skin: Negative.   Allergic/Immunologic: Negative.   Neurological: Negative.   Hematological: Negative.   Psychiatric/Behavioral: Negative.        Objective:   Physical Exam  Nursing note and vitals reviewed. Constitutional: He is oriented to  person, place, and time. He appears well-developed and well-nourished. No distress.  HENT:  Head: Normocephalic and atraumatic.  Right Ear: External ear normal.  Left Ear: External ear normal.  Mouth/Throat: Oropharynx is clear and moist. No oropharyngeal exudate.  Nasal congestion, with left being more swollen  Eyes: Conjunctivae and EOM are normal. Pupils are equal, round, and reactive to light. Right eye exhibits no discharge. Left eye exhibits no discharge. No scleral icterus.  Neck: Normal range of motion. Neck supple. No tracheal deviation present. No thyromegaly present.  Cardiovascular: Normal rate, regular rhythm, normal heart sounds and intact distal pulses.  Exam reveals no gallop and no friction rub.   No murmur heard. Pulmonary/Chest: Effort normal and breath sounds normal. No respiratory distress. He has no wheezes. He has no rales. He exhibits no tenderness.  Abdominal: Soft. Bowel sounds are normal. He exhibits no mass. There is no tenderness. There is no rebound and no guarding.  Musculoskeletal: Normal range of motion. He exhibits no edema and no tenderness.  Lymphadenopathy:    He has no cervical adenopathy.  Neurological: He is alert and oriented to person, place, and time. He has normal reflexes. No cranial nerve deficit.  Skin: Skin is warm and dry. No rash noted. No erythema. No pallor.  Psychiatric: He has a normal mood and affect. His behavior is normal. Judgment and thought content normal.   BP 155/80  Pulse  69  Temp(Src) 97.8 F (36.6 C) (Oral)  Ht 5\' 8"  (1.727 m)  Wt 170 lb (77.111 kg)  BMI 25.85 kg/m2        Assessment & Plan:  1. Hyperlipidemia   2. Hypertension   3. Vitamin D deficiency   4. Allergic rhinitis  Meds ordered this encounter  Medications  . DISCONTD: losartan (COZAAR) 100 MG tablet    Sig: TAKE 1 TABLET BY MOUTH EVERY DAY  . DISCONTD: rosuvastatin (CRESTOR) 20 MG tablet    Sig: Take 20 mg by mouth daily.  Marland Kitchen losartan  (COZAAR) 100 MG tablet    Sig: TAKE 1 TABLET BY MOUTH EVERY DAY    Dispense:  30 tablet    Refill:  6  . rosuvastatin (CRESTOR) 20 MG tablet    Sig: Take 1 tablet (20 mg total) by mouth daily.    Dispense:  30 tablet    Refill:  6   Patient Instructions                       Medicare Annual Wellness Visit  [facility] and the medical providers at [facility] strive to bring you the best medical care.  In doing so we not only want to address your current medical conditions and concerns but also to detect new conditions early and prevent illness, disease and health-related problems.    Medicare offers a yearly Wellness Visit which allows our clinical staff to assess your need for preventative services including immunizations, lifestyle education, counseling to decrease risk of preventable diseases and screening for fall risk and other medical concerns.    This visit is provided free of charge (no copay) for all Medicare recipients. The clinical pharmacists at [facility] have begun to conduct these Wellness Visits which will also include a thorough review of all your medications.    As you primary medical provider recommend that you make an appointment for your Annual Wellness Visit if you have not done so already this year.  You may set up this appointment before you leave today or you may call back [phone]) and schedule an appointment.  Please make sure when you call that you mention that you are scheduling your Annual Wellness Visit with the clinical pharmacist so that the appointment may be made for the proper length of time.      Continue current medications. Continue good therapeutic lifestyle changes which include good diet and exercise. Fall precautions discussed with patient. If an FOBT was given today- please return it to our front desk. If you are over 70 years old - you may need Prevnar 16 or the adult Pneumonia vaccine.  Flonase  OTC for allergic rhinitis 2 sprays at bed time. NS spray prn   Arrie Senate MD

## 2013-09-25 NOTE — Patient Instructions (Addendum)
Medicare Annual Wellness Visit  [facility] and the medical providers at [facility] strive to bring you the best medical care.  In doing so we not only want to address your current medical conditions and concerns but also to detect new conditions early and prevent illness, disease and health-related problems.    Medicare offers a yearly Wellness Visit which allows our clinical staff to assess your need for preventative services including immunizations, lifestyle education, counseling to decrease risk of preventable diseases and screening for fall risk and other medical concerns.    This visit is provided free of charge (no copay) for all Medicare recipients. The clinical pharmacists at [facility] have begun to conduct these Wellness Visits which will also include a thorough review of all your medications.    As you primary medical provider recommend that you make an appointment for your Annual Wellness Visit if you have not done so already this year.  You may set up this appointment before you leave today or you may call back ([phone]) and schedule an appointment.  Please make sure when you call that you mention that you are scheduling your Annual Wellness Visit with the clinical pharmacist so that the appointment may be made for the proper length of time.      Continue current medications. Continue good therapeutic lifestyle changes which include good diet and exercise. Fall precautions discussed with patient. If an FOBT was given today- please return it to our front desk. If you are over 22 years old - you may need Prevnar 67 or the adult Pneumonia vaccine.  Flonase OTC for allergic rhinitis 2 sprays at bed time. NS spray prn

## 2013-09-26 ENCOUNTER — Telehealth: Payer: Self-pay | Admitting: Family Medicine

## 2013-09-27 NOTE — Telephone Encounter (Signed)
Up front patient aware 

## 2013-10-10 ENCOUNTER — Other Ambulatory Visit: Payer: Medicare Other

## 2013-10-10 DIAGNOSIS — Z1212 Encounter for screening for malignant neoplasm of rectum: Secondary | ICD-10-CM

## 2013-10-12 LAB — FECAL OCCULT BLOOD, IMMUNOCHEMICAL: Fecal Occult Bld: NEGATIVE

## 2013-12-16 ENCOUNTER — Telehealth: Payer: Self-pay | Admitting: Family Medicine

## 2013-12-16 MED ORDER — ROSUVASTATIN CALCIUM 20 MG PO TABS: 20.0000 mg | ORAL_TABLET | Freq: Every day | ORAL | Status: DC

## 2013-12-16 NOTE — Telephone Encounter (Signed)
Pt notified Rx at front to pickup.

## 2014-01-15 ENCOUNTER — Telehealth: Payer: Self-pay | Admitting: Family Medicine

## 2014-01-15 NOTE — Telephone Encounter (Signed)
No samples of crestor available. They will check back at the end of the week.

## 2014-01-23 ENCOUNTER — Other Ambulatory Visit (INDEPENDENT_AMBULATORY_CARE_PROVIDER_SITE_OTHER): Payer: Medicare Other

## 2014-01-23 DIAGNOSIS — I10 Essential (primary) hypertension: Secondary | ICD-10-CM

## 2014-01-23 DIAGNOSIS — E559 Vitamin D deficiency, unspecified: Secondary | ICD-10-CM

## 2014-01-23 DIAGNOSIS — E785 Hyperlipidemia, unspecified: Secondary | ICD-10-CM

## 2014-01-24 LAB — BMP8+EGFR
BUN / CREAT RATIO: 10 (ref 10–22)
BUN: 11 mg/dL (ref 8–27)
CO2: 26 mmol/L (ref 18–29)
Calcium: 9.5 mg/dL (ref 8.6–10.2)
Chloride: 104 mmol/L (ref 97–108)
Creatinine, Ser: 1.09 mg/dL (ref 0.76–1.27)
GFR, EST AFRICAN AMERICAN: 77 mL/min/{1.73_m2} (ref >59)
GFR, EST NON AFRICAN AMERICAN: 67 mL/min/{1.73_m2} (ref >59)
GLUCOSE: 93 mg/dL (ref 65–99)
Potassium: 4.6 mmol/L (ref 3.5–5.2)
Sodium: 142 mmol/L (ref 134–144)

## 2014-01-24 LAB — NMR, LIPOPROFILE
CHOLESTEROL: 116 mg/dL (ref 100–199)
HDL Cholesterol by NMR: 42 mg/dL (ref >39)
HDL PARTICLE NUMBER: 29.3 umol/L — AB (ref >=30.5)
LDL Particle Number: 750 nmol/L (ref <1000)
LDL SIZE: 20.5 nm (ref >20.5)
LDLC SERPL CALC-MCNC: 62 mg/dL (ref 0–99)
LP-IR SCORE: 32 (ref <=45)
SMALL LDL PARTICLE NUMBER: 323 nmol/L (ref <=527)
Triglycerides by NMR: 60 mg/dL (ref 0–149)

## 2014-01-24 LAB — HEPATIC FUNCTION PANEL
ALBUMIN: 4.2 g/dL (ref 3.5–4.8)
ALK PHOS: 64 IU/L (ref 39–117)
ALT: 14 IU/L (ref 0–44)
AST: 24 IU/L (ref 0–40)
BILIRUBIN DIRECT: 0.2 mg/dL (ref 0.00–0.40)
BILIRUBIN TOTAL: 0.8 mg/dL (ref 0.0–1.2)
TOTAL PROTEIN: 6.3 g/dL (ref 6.0–8.5)

## 2014-01-24 LAB — VITAMIN D 25 HYDROXY (VIT D DEFICIENCY, FRACTURES): Vit D, 25-Hydroxy: 52.4 ng/mL (ref 30.0–100.0)

## 2014-01-27 ENCOUNTER — Encounter: Payer: Self-pay | Admitting: Family Medicine

## 2014-01-27 ENCOUNTER — Ambulatory Visit (INDEPENDENT_AMBULATORY_CARE_PROVIDER_SITE_OTHER): Payer: Medicare Other | Admitting: Family Medicine

## 2014-01-27 VITALS — BP 142/85 | HR 88 | Temp 98.2°F | Ht 68.0 in | Wt 172.0 lb

## 2014-01-27 DIAGNOSIS — E559 Vitamin D deficiency, unspecified: Secondary | ICD-10-CM

## 2014-01-27 DIAGNOSIS — I10 Essential (primary) hypertension: Secondary | ICD-10-CM

## 2014-01-27 DIAGNOSIS — D696 Thrombocytopenia, unspecified: Secondary | ICD-10-CM

## 2014-01-27 DIAGNOSIS — E785 Hyperlipidemia, unspecified: Secondary | ICD-10-CM

## 2014-01-27 LAB — POCT CBC
Granulocyte percent: 62.3 %G (ref 37–80)
HEMATOCRIT: 42.8 % — AB (ref 43.5–53.7)
HEMOGLOBIN: 14.1 g/dL (ref 14.1–18.1)
Lymph, poc: 1.7 (ref 0.6–3.4)
MCH: 30.7 pg (ref 27–31.2)
MCHC: 32.9 g/dL (ref 31.8–35.4)
MCV: 93.1 fL (ref 80–97)
MPV: 8.8 fL (ref 0–99.8)
POC Granulocyte: 3.6 (ref 2–6.9)
POC LYMPH %: 29.1 % (ref 10–50)
Platelet Count, POC: 126 10*3/uL — AB (ref 142–424)
RBC: 4.6 M/uL — AB (ref 4.69–6.13)
RDW, POC: 12.9 %
WBC: 5.7 10*3/uL (ref 4.6–10.2)

## 2014-01-27 NOTE — Patient Instructions (Addendum)
Medicare Annual Wellness Visit  [facility] and the medical providers at [facility] strive to bring you the best medical care.  In doing so we not only want to address your current medical conditions and concerns but also to detect new conditions early and prevent illness, disease and health-related problems.    Medicare offers a yearly Wellness Visit which allows our clinical staff to assess your need for preventative services including immunizations, lifestyle education, counseling to decrease risk of preventable diseases and screening for fall risk and other medical concerns.    This visit is provided free of charge (no copay) for all Medicare recipients. The clinical pharmacists at [facility] have begun to conduct these Wellness Visits which will also include a thorough review of all your medications.    As you primary medical provider recommend that you make an appointment for your Annual Wellness Visit if you have not done so already this year.  You may set up this appointment before you leave today or you may call back [phone]) and schedule an appointment.  Please make sure when you call that you mention that you are scheduling your Annual Wellness Visit with the clinical pharmacist so that the appointment may be made for the proper length of time.     Continue current medications. Continue good therapeutic lifestyle changes which include good diet and exercise. Fall precautions discussed with patient. If an FOBT was given today- please return it to our front desk. If you are over 27 years old - you may need Prevnar 72 or the adult Pneumonia vaccine.  Flu Shots will be available at our office starting mid- September. Please call and schedule a FLU CLINIC APPOINTMENT.   Continue current treatment, try to get more exercise

## 2014-01-27 NOTE — Progress Notes (Signed)
Subjective:    Patient ID: Mario Smith, male    DOB: 09/18/1940, 73 y.o.   MRN: [id number]  HPI Pt here for follow up and management of chronic medical problems. The patient's most recent lab work was reviewed with him at the visit today. Everything look good except for the HDL particle number being low. For some reason a CBC was not done and he was concerned about his platelets. We will do this as he is leaving the office. The patient brings with him home blood pressure readings and all of these were much better than the office. He has brought his monitor in to the office for comparison.        Patient Active Problem List   Diagnosis Date Noted  . Hyperlipidemia 05/27/2013  . Vitamin D deficiency 05/27/2013  . Hypertension 02/11/2010   Outpatient Encounter Prescriptions as of 01/27/2014  Medication Sig  . aspirin 81 MG EC tablet Take 81 mg by mouth daily.    . Cholecalciferol (VITAMIN D3) 2000 UNITS TABS Take 1 tablet by mouth daily.    . Coenzyme Q10 (CO Q 10 PO) Take 2 capsules by mouth daily.   Marland Kitchen losartan (COZAAR) 100 MG tablet TAKE 1 TABLET BY MOUTH EVERY DAY  . Omega-3 Fatty Acids (FISH OIL) 1000 MG CAPS Take 1 capsule by mouth 2 (two) times daily.    . rosuvastatin (CRESTOR) 20 MG tablet Take 1 tablet (20 mg total) by mouth daily.    Review of Systems  Constitutional: Negative.   HENT: Negative.   Eyes: Negative.   Respiratory: Negative.   Cardiovascular: Negative.   Gastrointestinal: Negative.   Endocrine: Negative.   Genitourinary: Negative.   Musculoskeletal: Negative.   Skin: Negative.   Allergic/Immunologic: Negative.   Neurological: Negative.   Hematological: Negative.   Psychiatric/Behavioral: Negative.        Objective:   Physical Exam  Nursing note and vitals reviewed. Constitutional: He is oriented to person, place, and time. He appears well-developed and well-nourished. No distress.  The patient is healthy and younger appearing than his stated  age of 17 years  HENT:  Head: Normocephalic and atraumatic.  Right Ear: External ear normal.  Left Ear: External ear normal.  Mouth/Throat: Oropharynx is clear and moist. No oropharyngeal exudate.  There is some nasal congestion bilaterally.  Eyes: Conjunctivae and EOM are normal. Pupils are equal, round, and reactive to light. Right eye exhibits no discharge. Left eye exhibits no discharge. No scleral icterus.  Neck: Normal range of motion. Neck supple. No tracheal deviation present. No thyromegaly present.  Cardiovascular: Normal rate, regular rhythm, normal heart sounds and intact distal pulses.  Exam reveals no gallop and no friction rub.   No murmur heard. At 72 per minute  Pulmonary/Chest: Effort normal and breath sounds normal. No respiratory distress. He has no wheezes. He has no rales. He exhibits no tenderness.  Abdominal: Soft. Bowel sounds are normal. He exhibits no mass. There is no tenderness. There is no rebound and no guarding.  Musculoskeletal: Normal range of motion. He exhibits no edema and no tenderness.  Lymphadenopathy:    He has no cervical adenopathy.  Neurological: He is alert and oriented to person, place, and time. He has normal reflexes. No cranial nerve deficit.  Skin: Skin is warm and dry. No rash noted. No erythema. No pallor.  Psychiatric: He has a normal mood and affect. His behavior is normal. Judgment and thought content normal.   BP 142/85  Pulse  88  Temp(Src) 98.2 F (36.8 C) (Oral)  Ht 5\' 8"  (1.727 m)  Wt 172 lb (78.019 kg)  BMI 26.16 kg/m2  See home blood pressure readings      Assessment & Plan:  1. Hyperlipidemia  2. Hypertension  3. Vitamin D deficiency  4. Decreased platelet count - POCT CBC  Patient Instructions                       Medicare Annual Wellness Visit  [facility] and the medical providers at [facility] strive to bring you the best medical care.  In doing so we not only want to address your  current medical conditions and concerns but also to detect new conditions early and prevent illness, disease and health-related problems.    Medicare offers a yearly Wellness Visit which allows our clinical staff to assess your need for preventative services including immunizations, lifestyle education, counseling to decrease risk of preventable diseases and screening for fall risk and other medical concerns.    This visit is provided free of charge (no copay) for all Medicare recipients. The clinical pharmacists at [facility] have begun to conduct these Wellness Visits which will also include a thorough review of all your medications.    As you primary medical provider recommend that you make an appointment for your Annual Wellness Visit if you have not done so already this year.  You may set up this appointment before you leave today or you may call back [phone]) and schedule an appointment.  Please make sure when you call that you mention that you are scheduling your Annual Wellness Visit with the clinical pharmacist so that the appointment may be made for the proper length of time.     Continue current medications. Continue good therapeutic lifestyle changes which include good diet and exercise. Fall precautions discussed with patient. If an FOBT was given today- please return it to our front desk. If you are over 1 years old - you may need Prevnar 67 or the adult Pneumonia vaccine.  Flu Shots will be available at our office starting mid- September. Please call and schedule a FLU CLINIC APPOINTMENT.   Continue current treatment, try to get more exercise   Arrie Senate MD

## 2014-01-28 ENCOUNTER — Telehealth: Payer: Self-pay | Admitting: *Deleted

## 2014-01-28 NOTE — Telephone Encounter (Signed)
Message copied by Priscille Heidelberg on Tue Jan 28, 2014 11:30 AM ------      Message from: Chipper Herb      Created: Mon Jan 27, 2014  4:07 PM       The white blood cell count was not elevated. The hemoglobin was good at 14.1. Platelet count was 126,000. This is slightly improved from last time we checked it.      I did leave a message on the patient's local phone that he may work on back with and talk with him ------

## 2014-02-04 ENCOUNTER — Telehealth: Payer: Self-pay | Admitting: Family Medicine

## 2014-02-04 ENCOUNTER — Other Ambulatory Visit: Payer: Self-pay | Admitting: *Deleted

## 2014-02-04 MED ORDER — LOSARTAN POTASSIUM 100 MG PO TABS: ORAL_TABLET | ORAL | Status: DC

## 2014-02-04 NOTE — Telephone Encounter (Signed)
Message copied by Cline Crock on Tue Feb 04, 2014  3:18 PM ------      Message from: Chipper Herb      Created: Mon Jan 27, 2014  4:07 PM       The white blood cell count was not elevated. The hemoglobin was good at 14.1. Platelet count was 126,000. This is slightly improved from last time we checked it.      I did leave a message on the patient's local phone that he may work on back with and talk with him ------

## 2014-02-05 ENCOUNTER — Encounter: Payer: Self-pay | Admitting: Family Medicine

## 2014-03-25 ENCOUNTER — Encounter: Payer: Self-pay | Admitting: Family Medicine

## 2014-03-25 ENCOUNTER — Ambulatory Visit (INDEPENDENT_AMBULATORY_CARE_PROVIDER_SITE_OTHER): Payer: Medicare Other | Admitting: Family Medicine

## 2014-03-25 VITALS — BP 135/79 | HR 90 | Temp 99.0°F | Ht 68.0 in | Wt 175.0 lb

## 2014-03-25 DIAGNOSIS — N410 Acute prostatitis: Secondary | ICD-10-CM

## 2014-03-25 DIAGNOSIS — R300 Dysuria: Secondary | ICD-10-CM

## 2014-03-25 DIAGNOSIS — R509 Fever, unspecified: Secondary | ICD-10-CM

## 2014-03-25 LAB — POCT URINALYSIS DIPSTICK
BILIRUBIN UA: NEGATIVE
Glucose, UA: NEGATIVE
Nitrite, UA: NEGATIVE
Spec Grav, UA: 1.015
Urobilinogen, UA: NEGATIVE
pH, UA: 5

## 2014-03-25 LAB — POCT UA - MICROSCOPIC ONLY
Casts, Ur, LPF, POC: NEGATIVE
Crystals, Ur, HPF, POC: NEGATIVE
Mucus, UA: NEGATIVE
Yeast, UA: NEGATIVE

## 2014-03-25 MED ORDER — SULFAMETHOXAZOLE-TMP DS 800-160 MG PO TABS: 1.0000 | ORAL_TABLET | Freq: Two times a day (BID) | ORAL | Status: DC

## 2014-03-25 NOTE — Patient Instructions (Signed)
Drink plenty of fluids Take Tylenol for aches pains and fever Take antibiotic as directed Return to clinic in 4 weeks to recheck another urine specimen

## 2014-03-25 NOTE — Progress Notes (Signed)
Subjective:    Patient ID: Mario Smith, male    DOB: 1940-10-10, 73 y.o.   MRN: [id number]  HPI Patient here for fever, chills, body aches, and feels as if he may have prostate infection. He indicates that it burns sustained when he goes to the bathroom and that his urine has slowed considerably        Patient Active Problem List   Diagnosis Date Noted  . Hyperlipidemia 05/27/2013  . Vitamin D deficiency 05/27/2013  . Hypertension 02/11/2010   Outpatient Encounter Prescriptions as of 03/25/2014  Medication Sig  . aspirin 81 MG EC tablet Take 81 mg by mouth daily.    . Cholecalciferol (VITAMIN D3) 2000 UNITS TABS Take 1 tablet by mouth daily.    . Coenzyme Q10 (CO Q 10 PO) Take 2 capsules by mouth daily.   Marland Kitchen losartan (COZAAR) 100 MG tablet TAKE 1 TABLET BY MOUTH EVERY DAY  . Omega-3 Fatty Acids (FISH OIL) 1000 MG CAPS Take 1 capsule by mouth 2 (two) times daily.    . rosuvastatin (CRESTOR) 20 MG tablet Take 1 tablet (20 mg total) by mouth daily.    Review of Systems  Constitutional: Positive for fever and chills.  Eyes: Negative.   Respiratory: Negative.   Cardiovascular: Negative.   Gastrointestinal: Negative.   Endocrine: Negative.   Genitourinary: Positive for dysuria and frequency.  Musculoskeletal: Negative.   Skin: Negative.   Allergic/Immunologic: Negative.   Neurological: Positive for headaches.  Hematological: Negative.   Psychiatric/Behavioral: Negative.        Objective:   Physical Exam  Nursing note and vitals reviewed. Constitutional: He is oriented to person, place, and time. He appears well-developed and well-nourished. He appears distressed.  HENT:  Head: Normocephalic and atraumatic.  Right Ear: External ear normal.  Left Ear: External ear normal.  Nose: Nose normal.  Mouth/Throat: Oropharynx is clear and moist. No oropharyngeal exudate.  Eyes: Conjunctivae and EOM are normal. Pupils are equal, round, and reactive to light. Right eye  exhibits no discharge. Left eye exhibits no discharge. No scleral icterus.  Neck: Normal range of motion. Neck supple. No tracheal deviation present. No thyromegaly present.  Cardiovascular: Normal rate, regular rhythm and normal heart sounds.   No murmur heard. At 72 per minute  Pulmonary/Chest: Effort normal and breath sounds normal. No respiratory distress. He has no wheezes. He has no rales. He exhibits no tenderness.  Abdominal: Soft. Bowel sounds are normal. He exhibits no mass. There is no tenderness. There is no rebound and no guarding.  No suprapubic tenderness  Genitourinary: Rectum normal.  The prostate was firm and tender and congested. A wet prep was taken and had a yellow discharge.  Musculoskeletal: Normal range of motion. He exhibits no edema.  Lymphadenopathy:    He has no cervical adenopathy.  Neurological: He is alert and oriented to person, place, and time.  Skin: Skin is warm and dry. No rash noted. No erythema. No pallor.  Psychiatric: He has a normal mood and affect. His behavior is normal. Judgment and thought content normal.   BP 135/79  Pulse 90  Temp(Src) 99 F (37.2 C) (Oral)  Ht 5\' 8"  (1.727 m)  Results for orders placed in visit on 03/25/14  POCT URINALYSIS DIPSTICK      Result Value Ref Range   Color, UA gold     Clarity, UA clear     Glucose, UA neg     Bilirubin, UA neg  Ketones, UA large     Spec Grav, UA 1.015     Blood, UA large     pH, UA 5.0     Protein, UA 4+     Urobilinogen, UA negative     Nitrite, UA neg     Leukocytes, UA large (3+)    POCT UA - MICROSCOPIC ONLY      Result Value Ref Range   WBC, Ur, HPF, POC 20-30     RBC, urine, microscopic 10-20     Bacteria, U Microscopic few     Mucus, UA neg     Epithelial cells, urine per micros occ     Crystals, Ur, HPF, POC neg     Casts, Ur, LPF, POC neg     Yeast, UA neg     The wet prep after the prostate exam had too numerous to count WBC      Assessment & Plan:  1.  Dysuria - POCT urinalysis dipstick - POCT UA - Microscopic Only - Urine culture - sulfamethoxazole-trimethoprim (BACTRIM DS) 800-160 MG per tablet; Take 1 tablet by mouth 2 (two) times daily.  Dispense: 60 tablet; Refill: 1  2. Fever, unspecified fever cause - POCT urinalysis dipstick - POCT UA - Microscopic Only - Urine culture - sulfamethoxazole-trimethoprim (BACTRIM DS) 800-160 MG per tablet; Take 1 tablet by mouth 2 (two) times daily.  Dispense: 60 tablet; Refill: 1  3. Acute prostatitis - sulfamethoxazole-trimethoprim (BACTRIM DS) 800-160 MG per tablet; Take 1 tablet by mouth 2 (two) times daily.  Dispense: 60 tablet; Refill: 1  Patient Instructions  Drink plenty of fluids Take Tylenol for aches pains and fever Take antibiotic as directed Return to clinic in 4 weeks to recheck another urine specimen   Arrie Senate MD

## 2014-03-27 LAB — URINE CULTURE

## 2014-04-01 ENCOUNTER — Other Ambulatory Visit (INDEPENDENT_AMBULATORY_CARE_PROVIDER_SITE_OTHER): Payer: Medicare Other

## 2014-04-01 DIAGNOSIS — B3749 Other urogenital candidiasis: Secondary | ICD-10-CM

## 2014-04-01 LAB — POCT URINALYSIS DIPSTICK
BILIRUBIN UA: NEGATIVE
Blood, UA: NEGATIVE
Glucose, UA: NEGATIVE
KETONES UA: NEGATIVE
Leukocytes, UA: NEGATIVE
Nitrite, UA: NEGATIVE
PH UA: 5
PROTEIN UA: NEGATIVE
SPEC GRAV UA: 1.025
Urobilinogen, UA: NEGATIVE

## 2014-04-01 LAB — POCT UA - MICROSCOPIC ONLY
BACTERIA, U MICROSCOPIC: NEGATIVE
CRYSTALS, UR, HPF, POC: NEGATIVE
Casts, Ur, LPF, POC: NEGATIVE
Mucus, UA: NEGATIVE
RBC, URINE, MICROSCOPIC: NEGATIVE
WBC, UR, HPF, POC: NEGATIVE
Yeast, UA: NEGATIVE

## 2014-04-01 NOTE — Progress Notes (Signed)
Lab only 

## 2014-04-10 ENCOUNTER — Telehealth: Payer: Self-pay | Admitting: Family Medicine

## 2014-04-10 DIAGNOSIS — N390 Urinary tract infection, site not specified: Secondary | ICD-10-CM

## 2014-04-10 DIAGNOSIS — B9689 Other specified bacterial agents as the cause of diseases classified elsewhere: Secondary | ICD-10-CM

## 2014-04-10 NOTE — Telephone Encounter (Signed)
Please have patient discontinue the sulfa medication. Have him come by and give [place] a repeat urinalysis at his convenience

## 2014-04-10 NOTE — Telephone Encounter (Signed)
STP advised to dc sulfa med per dr Laurance Flatten and to come by to give [place] a urine sample to do a repeat UA, pt voiced understanding

## 2014-04-10 NOTE — Telephone Encounter (Signed)
Pt states he has been having nausea since he started the Bactrim and increased fatigue.No other symptoms noted. He has been taking since 10/20, would you like him to continue taking the Bactrim or switch him over to something else? Please advise

## 2014-04-11 ENCOUNTER — Other Ambulatory Visit (INDEPENDENT_AMBULATORY_CARE_PROVIDER_SITE_OTHER): Payer: Medicare Other

## 2014-04-11 DIAGNOSIS — N390 Urinary tract infection, site not specified: Secondary | ICD-10-CM

## 2014-04-11 DIAGNOSIS — B9689 Other specified bacterial agents as the cause of diseases classified elsewhere: Secondary | ICD-10-CM

## 2014-04-11 LAB — POCT URINALYSIS DIPSTICK
Bilirubin, UA: NEGATIVE
GLUCOSE UA: NEGATIVE
KETONES UA: NEGATIVE
Nitrite, UA: NEGATIVE
PH UA: 6
Protein, UA: NEGATIVE
RBC UA: NEGATIVE
Spec Grav, UA: 1.01
Urobilinogen, UA: NEGATIVE

## 2014-04-11 LAB — POCT UA - MICROSCOPIC ONLY
Bacteria, U Microscopic: NEGATIVE
CASTS, UR, LPF, POC: NEGATIVE
CRYSTALS, UR, HPF, POC: NEGATIVE
Mucus, UA: NEGATIVE
RBC, urine, microscopic: NEGATIVE
Yeast, UA: NEGATIVE

## 2014-04-11 NOTE — Progress Notes (Signed)
Lab only 

## 2014-04-13 LAB — URINE CULTURE: Organism ID, Bacteria: NO GROWTH

## 2014-04-16 ENCOUNTER — Ambulatory Visit (INDEPENDENT_AMBULATORY_CARE_PROVIDER_SITE_OTHER): Payer: Medicare Other

## 2014-04-16 DIAGNOSIS — Z23 Encounter for immunization: Secondary | ICD-10-CM

## 2014-04-22 ENCOUNTER — Ambulatory Visit (INDEPENDENT_AMBULATORY_CARE_PROVIDER_SITE_OTHER): Payer: Medicare Other

## 2014-04-22 ENCOUNTER — Other Ambulatory Visit: Payer: Self-pay | Admitting: Family Medicine

## 2014-04-22 ENCOUNTER — Encounter (INDEPENDENT_AMBULATORY_CARE_PROVIDER_SITE_OTHER): Payer: Self-pay

## 2014-04-22 ENCOUNTER — Encounter: Payer: Self-pay | Admitting: Family Medicine

## 2014-04-22 ENCOUNTER — Ambulatory Visit (INDEPENDENT_AMBULATORY_CARE_PROVIDER_SITE_OTHER): Payer: Medicare Other | Admitting: Family Medicine

## 2014-04-22 VITALS — BP 154/75 | HR 72 | Temp 98.5°F | Ht 68.0 in | Wt 172.0 lb

## 2014-04-22 DIAGNOSIS — N419 Inflammatory disease of prostate, unspecified: Secondary | ICD-10-CM

## 2014-04-22 DIAGNOSIS — M79641 Pain in right hand: Secondary | ICD-10-CM

## 2014-04-22 DIAGNOSIS — N410 Acute prostatitis: Secondary | ICD-10-CM

## 2014-04-22 DIAGNOSIS — S6991XA Unspecified injury of right wrist, hand and finger(s), initial encounter: Secondary | ICD-10-CM

## 2014-04-22 LAB — POCT URINALYSIS DIPSTICK
Bilirubin, UA: NEGATIVE
Blood, UA: NEGATIVE
Glucose, UA: NEGATIVE
Ketones, UA: NEGATIVE
NITRITE UA: NEGATIVE
PROTEIN UA: NEGATIVE
SPEC GRAV UA: 1.02
UROBILINOGEN UA: NEGATIVE
pH, UA: 5

## 2014-04-22 LAB — POCT UA - MICROSCOPIC ONLY
CASTS, UR, LPF, POC: NEGATIVE
CRYSTALS, UR, HPF, POC: NEGATIVE
RBC, urine, microscopic: NEGATIVE
YEAST UA: NEGATIVE

## 2014-04-22 NOTE — Patient Instructions (Signed)
We will call you with the result of the urine culture and most likely continue treating with a different antibiotic We will call you with the results of the x-rays of the right hand once those results are available Continue to drink plenty of fluids

## 2014-04-22 NOTE — Telephone Encounter (Signed)
No samples 

## 2014-04-22 NOTE — Progress Notes (Signed)
Subjective:    Patient ID: Mario Smith, male    DOB: 1940/09/23, 73 y.o.   MRN: [id number]  HPI Patient here today for 4 week follow up from acute prostatitis, dysuria, and fever. He states he is feeling much better.the urine today had 10-15 pus cells. He had to stop the sulfa medication early because it was bothering his stomach. He only has slight burning nail which she first noticed this morning. We will get another urine culture and sensitivity and then treat the patient based on this reading. He also indicates that he had a fall and injured the ring finger of the right hand and is not able to fully flex the DIP joint.        Patient Active Problem List   Diagnosis Date Noted  . Hyperlipidemia 05/27/2013  . Vitamin D deficiency 05/27/2013  . Hypertension 02/11/2010   Outpatient Encounter Prescriptions as of 04/22/2014  Medication Sig  . aspirin 81 MG EC tablet Take 81 mg by mouth daily.    . Cholecalciferol (VITAMIN D3) 2000 UNITS TABS Take 1 tablet by mouth daily.    . Coenzyme Q10 (CO Q 10 PO) Take 2 capsules by mouth daily.   Marland Kitchen losartan (COZAAR) 100 MG tablet TAKE 1 TABLET BY MOUTH EVERY DAY  . Omega-3 Fatty Acids (FISH OIL) 1000 MG CAPS Take 1 capsule by mouth 2 (two) times daily.    . rosuvastatin (CRESTOR) 20 MG tablet Take 1 tablet (20 mg total) by mouth daily.  . [DISCONTINUED] sulfamethoxazole-trimethoprim (BACTRIM DS) 800-160 MG per tablet Take 1 tablet by mouth 2 (two) times daily.    Review of Systems  Constitutional: Negative.   HENT: Negative.   Eyes: Negative.   Respiratory: Negative.   Cardiovascular: Negative.   Gastrointestinal: Negative.   Endocrine: Negative.   Genitourinary: Negative.   Musculoskeletal: Negative.   Skin: Negative.   Allergic/Immunologic: Negative.   Neurological: Negative.   Hematological: Negative.   Psychiatric/Behavioral: Negative.        Objective:   Physical Exam  Constitutional: He is oriented to person, place, and  time. He appears well-developed and well-nourished. No distress.  HENT:  Head: Normocephalic.  Eyes: Conjunctivae and EOM are normal. Pupils are equal, round, and reactive to light. Right eye exhibits no discharge. Left eye exhibits no discharge.  Neck: Normal range of motion.  Abdominal: Soft. Bowel sounds are normal. He exhibits no distension and no mass. There is no tenderness. There is no rebound.  Musculoskeletal: Normal range of motion. He exhibits tenderness. He exhibits no edema.  The patient has tenderness in the palmar aspect of the ring finger of the right hand and is not able to fully flex the DIP joint-----this is only been this way since a fall that he had about 4 weeks ago.  Neurological: He is alert and oriented to person, place, and time.  Skin: Skin is warm and dry. No rash noted.  Psychiatric: His behavior is normal. Judgment and thought content normal.  Nursing note and vitals reviewed.  BP 154/75 mmHg  Pulse 72  Temp(Src) 98.5 F (36.9 C) (Oral)  Ht 5\' 8"  (1.727 m)  Wt 172 lb (78.019 kg)  BMI 26.16 kg/m2  Results for orders placed or performed in visit on 04/22/14  POCT UA - Microscopic Only  Result Value Ref Range   WBC, Ur, HPF, POC 10-15    RBC, urine, microscopic NEG    Bacteria, U Microscopic OCC    Mucus, UA MOD  Epithelial cells, urine per micros RARE    Crystals, Ur, HPF, POC NEG    Casts, Ur, LPF, POC NEG    Yeast, UA NEG   POCT urinalysis dipstick  Result Value Ref Range   Color, UA YELLOW    Clarity, UA CLEAR    Glucose, UA NEG    Bilirubin, UA NEG    Ketones, UA NEG    Spec Grav, UA 1.020    Blood, UA NEG    pH, UA 5.0    Protein, UA NEG    Urobilinogen, UA negative    Nitrite, UA NEG    Leukocytes, UA Trace     [facility] reading (PRIMARY) by  DMoore-right hand fourth finger  -abnormal at DIP joint                                   Assessment & Plan:  1. Acute prostatitis - POCT UA - Microscopic Only - POCT urinalysis dipstick -  Urine culture  2. Right hand pain - DG Hand Complete Right; Future  3. Injury of fourth finger of right hand, initial encounter  4. Prostatitis, unspecified prostatitis type  Patient Instructions  We will call you with the result of the urine culture and most likely continue treating with a different antibiotic We will call you with the results of the x-rays of the right hand once those results are available Continue to drink plenty of fluids   Arrie Senate MD

## 2014-04-24 ENCOUNTER — Telehealth: Payer: Self-pay

## 2014-04-24 LAB — URINE CULTURE

## 2014-04-24 NOTE — Telephone Encounter (Signed)
Wife aware of finger x-ray results, as per DPR of 01/27/2014, and informed that we would be sending him to see an orthopedic doctor

## 2014-04-24 NOTE — Telephone Encounter (Signed)
-----   Message from Chipper Herb, MD sent at 04/23/2014 11:08 AM EST ----- As per radiology report----please have the patient schedule for an appointment with Dr. Doran Durand to further evaluate the inability to fully flex his finger following this injury----Dr. Hewitt---also please note to the patient the presence of the foreign body

## 2014-04-25 ENCOUNTER — Telehealth: Payer: Self-pay | Admitting: Family Medicine

## 2014-04-25 MED ORDER — DOXYCYCLINE HYCLATE 100 MG PO TABS: 100.0000 mg | ORAL_TABLET | Freq: Two times a day (BID) | ORAL | Status: DC

## 2014-04-25 NOTE — Addendum Note (Signed)
Addended by: Thana Ates on: 04/25/2014 02:25 PM   Modules accepted: Orders

## 2014-04-25 NOTE — Telephone Encounter (Signed)
-----   Message from Chipper Herb, MD sent at 04/24/2014  7:28 AM EST ----- The urine culture had minimal growth of bacteria.------ because of the recent urinalysis and recent infection and a history of some slight burning, take doxycycline 100 mg #28 one twice daily with food for 2 weeks. If the burning continues he should let us know that.

## 2014-04-25 NOTE — Telephone Encounter (Signed)
Pt aware of results and Rx called to pharmacy

## 2014-05-09 ENCOUNTER — Other Ambulatory Visit: Payer: Self-pay

## 2014-05-09 DIAGNOSIS — M79641 Pain in right hand: Secondary | ICD-10-CM

## 2014-05-15 ENCOUNTER — Encounter (HOSPITAL_BASED_OUTPATIENT_CLINIC_OR_DEPARTMENT_OTHER): Payer: Self-pay | Admitting: *Deleted

## 2014-05-15 ENCOUNTER — Other Ambulatory Visit: Payer: Self-pay

## 2014-05-15 ENCOUNTER — Other Ambulatory Visit: Payer: Self-pay | Admitting: Orthopedic Surgery

## 2014-05-15 ENCOUNTER — Encounter (HOSPITAL_COMMUNITY)
Admission: RE | Admit: 2014-05-15 | Discharge: 2014-05-15 | Disposition: A | Discharge status: Home / Self Care | Payer: Medicare Other | Source: Ambulatory Visit | Attending: Orthopedic Surgery | Admitting: Orthopedic Surgery

## 2014-05-15 DIAGNOSIS — M66341 Spontaneous rupture of flexor tendons, right hand: Secondary | ICD-10-CM | POA: Diagnosis not present

## 2014-05-15 DIAGNOSIS — E785 Hyperlipidemia, unspecified: Secondary | ICD-10-CM | POA: Diagnosis not present

## 2014-05-15 DIAGNOSIS — S56115A Strain of flexor muscle, fascia and tendon of right ring finger at forearm level, initial encounter: Secondary | ICD-10-CM | POA: Diagnosis not present

## 2014-05-15 DIAGNOSIS — X58XXXA Exposure to other specified factors, initial encounter: Secondary | ICD-10-CM | POA: Diagnosis not present

## 2014-05-15 DIAGNOSIS — Y929 Unspecified place or not applicable: Secondary | ICD-10-CM | POA: Diagnosis not present

## 2014-05-15 DIAGNOSIS — Z7982 Long term (current) use of aspirin: Secondary | ICD-10-CM | POA: Diagnosis not present

## 2014-05-15 DIAGNOSIS — Z87891 Personal history of nicotine dependence: Secondary | ICD-10-CM | POA: Diagnosis not present

## 2014-05-15 DIAGNOSIS — I10 Essential (primary) hypertension: Secondary | ICD-10-CM | POA: Diagnosis not present

## 2014-05-15 LAB — BASIC METABOLIC PANEL
ANION GAP: 12 (ref 5–15)
BUN: 13 mg/dL (ref 6–23)
CO2: 28 mEq/L (ref 19–32)
Calcium: 9.8 mg/dL (ref 8.4–10.5)
Chloride: 103 mEq/L (ref 96–112)
Creatinine, Ser: 1.2 mg/dL (ref 0.50–1.35)
GFR calc Af Amer: 67 mL/min — ABNORMAL LOW (ref >90)
GFR, EST NON AFRICAN AMERICAN: 58 mL/min — AB (ref >90)
GLUCOSE: 89 mg/dL (ref 70–99)
POTASSIUM: 4.8 meq/L (ref 3.7–5.3)
Sodium: 143 mEq/L (ref 137–147)

## 2014-05-15 NOTE — Progress Notes (Signed)
Pt will fo ro [facility] for ekg-bmet

## 2014-05-16 ENCOUNTER — Ambulatory Visit (HOSPITAL_BASED_OUTPATIENT_CLINIC_OR_DEPARTMENT_OTHER): Payer: Medicare Other | Admitting: Anesthesiology

## 2014-05-16 ENCOUNTER — Encounter (HOSPITAL_BASED_OUTPATIENT_CLINIC_OR_DEPARTMENT_OTHER): Payer: Self-pay | Admitting: *Deleted

## 2014-05-16 ENCOUNTER — Encounter (HOSPITAL_BASED_OUTPATIENT_CLINIC_OR_DEPARTMENT_OTHER)
Admission: RE | Disposition: A | Discharge status: Home / Self Care | Payer: Self-pay | Source: Ambulatory Visit | Attending: Orthopedic Surgery

## 2014-05-16 ENCOUNTER — Ambulatory Visit (HOSPITAL_BASED_OUTPATIENT_CLINIC_OR_DEPARTMENT_OTHER)
Admission: RE | Admit: 2014-05-16 | Discharge: 2014-05-16 | Disposition: A | Discharge status: Home / Self Care | Payer: Medicare Other | Source: Ambulatory Visit | Attending: Orthopedic Surgery | Admitting: Orthopedic Surgery

## 2014-05-16 DIAGNOSIS — E785 Hyperlipidemia, unspecified: Secondary | ICD-10-CM | POA: Insufficient documentation

## 2014-05-16 DIAGNOSIS — Z7982 Long term (current) use of aspirin: Secondary | ICD-10-CM | POA: Insufficient documentation

## 2014-05-16 DIAGNOSIS — X58XXXA Exposure to other specified factors, initial encounter: Secondary | ICD-10-CM | POA: Insufficient documentation

## 2014-05-16 DIAGNOSIS — M66341 Spontaneous rupture of flexor tendons, right hand: Secondary | ICD-10-CM | POA: Diagnosis not present

## 2014-05-16 DIAGNOSIS — I10 Essential (primary) hypertension: Secondary | ICD-10-CM | POA: Diagnosis not present

## 2014-05-16 DIAGNOSIS — Y929 Unspecified place or not applicable: Secondary | ICD-10-CM | POA: Insufficient documentation

## 2014-05-16 DIAGNOSIS — S56115A Strain of flexor muscle, fascia and tendon of right ring finger at forearm level, initial encounter: Secondary | ICD-10-CM | POA: Diagnosis not present

## 2014-05-16 DIAGNOSIS — Z87891 Personal history of nicotine dependence: Secondary | ICD-10-CM | POA: Insufficient documentation

## 2014-05-16 HISTORY — PX: TENDON REPAIR: SHX5111

## 2014-05-16 HISTORY — DX: Other specified postprocedural states: Z98.890

## 2014-05-16 HISTORY — DX: Nausea with vomiting, unspecified: R11.2

## 2014-05-16 LAB — POCT HEMOGLOBIN-HEMACUE: HEMOGLOBIN: 15.5 g/dL (ref 13.0–17.0)

## 2014-05-16 SURGERY — TENDON REPAIR
Anesthesia: General | Site: Hand | Laterality: Right

## 2014-05-16 MED ORDER — ACETAMINOPHEN 500 MG PO TABS
1000.0000 mg | ORAL_TABLET | Freq: Once | ORAL | Status: AC
  Administered 2014-05-16: 1000 mg via ORAL

## 2014-05-16 MED ORDER — CEPHALEXIN 500 MG PO CAPS: 500.0000 mg | ORAL_CAPSULE | Freq: Four times a day (QID) | ORAL | Status: DC

## 2014-05-16 MED ORDER — MIDAZOLAM HCL 2 MG/2ML IJ SOLN
INTRAMUSCULAR | Status: AC
  Filled 2014-05-16: qty 2

## 2014-05-16 MED ORDER — PROPOFOL 10 MG/ML IV BOLUS
INTRAVENOUS | Status: AC
  Filled 2014-05-16: qty 20

## 2014-05-16 MED ORDER — LACTATED RINGERS IV SOLN
INTRAVENOUS | Status: DC
  Administered 2014-05-16 (×2): via INTRAVENOUS

## 2014-05-16 MED ORDER — SCOPOLAMINE 1 MG/3DAYS TD PT72: 1.0000 | MEDICATED_PATCH | TRANSDERMAL | Status: DC

## 2014-05-16 MED ORDER — DEXAMETHASONE SODIUM PHOSPHATE 4 MG/ML IJ SOLN
INTRAMUSCULAR | Status: DC | PRN
  Administered 2014-05-16: 10 mg via INTRAVENOUS

## 2014-05-16 MED ORDER — TAPENTADOL HCL 50 MG PO TABS: 100.0000 mg | ORAL_TABLET | ORAL | Status: DC | PRN

## 2014-05-16 MED ORDER — CEFAZOLIN SODIUM-DEXTROSE 2-3 GM-% IV SOLR
INTRAVENOUS | Status: AC
  Filled 2014-05-16: qty 50

## 2014-05-16 MED ORDER — CHLORHEXIDINE GLUCONATE 4 % EX LIQD: 60.0000 mL | Freq: Once | CUTANEOUS | Status: DC

## 2014-05-16 MED ORDER — PROMETHAZINE HCL 25 MG/ML IJ SOLN: 6.2500 mg | INTRAMUSCULAR | Status: DC | PRN

## 2014-05-16 MED ORDER — ACETAMINOPHEN 500 MG PO TABS
ORAL_TABLET | ORAL | Status: AC
  Filled 2014-05-16: qty 2

## 2014-05-16 MED ORDER — HYDROMORPHONE HCL 1 MG/ML IJ SOLN
INTRAMUSCULAR | Status: AC
  Filled 2014-05-16: qty 1

## 2014-05-16 MED ORDER — FENTANYL CITRATE 0.05 MG/ML IJ SOLN
INTRAMUSCULAR | Status: AC
  Filled 2014-05-16: qty 2

## 2014-05-16 MED ORDER — FENTANYL CITRATE 0.05 MG/ML IJ SOLN
50.0000 ug | INTRAMUSCULAR | Status: DC | PRN
  Administered 2014-05-16: 100 ug via INTRAVENOUS

## 2014-05-16 MED ORDER — HYDROMORPHONE HCL 1 MG/ML IJ SOLN
0.2500 mg | INTRAMUSCULAR | Status: DC | PRN
  Administered 2014-05-16: 0.25 mg via INTRAVENOUS

## 2014-05-16 MED ORDER — CEFAZOLIN SODIUM-DEXTROSE 2-3 GM-% IV SOLR
2.0000 g | INTRAVENOUS | Status: AC
  Administered 2014-05-16: 2 g via INTRAVENOUS

## 2014-05-16 MED ORDER — FENTANYL CITRATE 0.05 MG/ML IJ SOLN
INTRAMUSCULAR | Status: DC | PRN
  Administered 2014-05-16 (×2): 25 ug via INTRAVENOUS

## 2014-05-16 MED ORDER — MIDAZOLAM HCL 2 MG/2ML IJ SOLN
1.0000 mg | INTRAMUSCULAR | Status: DC | PRN
  Administered 2014-05-16: 1 mg via INTRAVENOUS

## 2014-05-16 MED ORDER — BUPIVACAINE-EPINEPHRINE (PF) 0.5% -1:200000 IJ SOLN
INTRAMUSCULAR | Status: DC | PRN
  Administered 2014-05-16: 30 mL via PERINEURAL

## 2014-05-16 MED ORDER — FENTANYL CITRATE 0.05 MG/ML IJ SOLN
INTRAMUSCULAR | Status: AC
  Filled 2014-05-16: qty 4

## 2014-05-16 MED ORDER — PROPOFOL 10 MG/ML IV BOLUS
INTRAVENOUS | Status: DC | PRN
  Administered 2014-05-16: 50 mg via INTRAVENOUS
  Administered 2014-05-16: 200 mg via INTRAVENOUS

## 2014-05-16 SURGICAL SUPPLY — 74 items
BANDAGE COBAN STERILE 2 (GAUZE/BANDAGES/DRESSINGS) ×1 IMPLANT
BANDAGE ELASTIC 3 VELCRO ST LF (GAUZE/BANDAGES/DRESSINGS) ×6 IMPLANT
BLADE CLIPPER SURG (BLADE) IMPLANT
BLADE OSC/SAG .038X5.5 CUT EDG (BLADE) IMPLANT
BLADE SURG 15 STRL LF DISP TIS (BLADE) ×5 IMPLANT
BLADE SURG 15 STRL SS (BLADE) ×6
BNDG CONFORM 2 STRL LF (GAUZE/BANDAGES/DRESSINGS) IMPLANT
BNDG CONFORM 3 STRL LF (GAUZE/BANDAGES/DRESSINGS) ×5 IMPLANT
BNDG GAUZE ELAST 4 BULKY (GAUZE/BANDAGES/DRESSINGS) ×3 IMPLANT
BRUSH SCRUB EZ PLAIN DRY (MISCELLANEOUS) ×3 IMPLANT
CANISTER SUCT 1200ML W/VALVE (MISCELLANEOUS) ×1 IMPLANT
CORDS BIPOLAR (ELECTRODE) ×3 IMPLANT
COVER BACK TABLE 60X90IN (DRAPES) ×3 IMPLANT
COVER MAYO STAND STRL (DRAPES) ×3 IMPLANT
CUFF TOURNIQUET SINGLE 18IN (TOURNIQUET CUFF) ×2 IMPLANT
DECANTER SPIKE VIAL GLASS SM (MISCELLANEOUS) IMPLANT
DRAIN JACKSON RD 7FR 3/32 (WOUND CARE) IMPLANT
DRAPE EXTREMITY T 121X128X90 (DRAPE) ×3 IMPLANT
DRAPE OEC MINIVIEW 54X84 (DRAPES) ×2 IMPLANT
DRAPE SURG 17X23 STRL (DRAPES) ×3 IMPLANT
DRSG EMULSION OIL 3X3 NADH (GAUZE/BANDAGES/DRESSINGS) ×3 IMPLANT
GAUZE SPONGE 4X4 16PLY XRAY LF (GAUZE/BANDAGES/DRESSINGS) IMPLANT
GAUZE XEROFORM 1X8 LF (GAUZE/BANDAGES/DRESSINGS) ×3 IMPLANT
GLOVE BIOGEL M STRL SZ7.5 (GLOVE) IMPLANT
GLOVE BIOGEL PI IND STRL 7.0 (GLOVE) ×3 IMPLANT
GLOVE BIOGEL PI INDICATOR 7.0 (GLOVE) ×3
GLOVE ECLIPSE 6.5 STRL STRAW (GLOVE) ×4 IMPLANT
GLOVE SS BIOGEL STRL SZ 8 (GLOVE) ×2 IMPLANT
GLOVE SUPERSENSE BIOGEL SZ 8 (GLOVE) ×1
GOWN STRL REUS W/ TWL LRG LVL3 (GOWN DISPOSABLE) ×3 IMPLANT
GOWN STRL REUS W/ TWL XL LVL3 (GOWN DISPOSABLE) ×2 IMPLANT
GOWN STRL REUS W/TWL LRG LVL3 (GOWN DISPOSABLE) ×6
GOWN STRL REUS W/TWL XL LVL3 (GOWN DISPOSABLE) ×3
NDL HYPO 25X1 1.5 SAFETY (NEEDLE) ×1 IMPLANT
NDL KEITH (NEEDLE) IMPLANT
NEEDLE HYPO 22GX1.5 SAFETY (NEEDLE) IMPLANT
NEEDLE HYPO 25X1 1.5 SAFETY (NEEDLE) ×3 IMPLANT
NEEDLE KEITH (NEEDLE) ×3 IMPLANT
NS IRRIG 1000ML POUR BTL (IV SOLUTION) ×3 IMPLANT
PACK BASIN DAY SURGERY FS (CUSTOM PROCEDURE TRAY) ×3 IMPLANT
PAD CAST 3X4 CTTN HI CHSV (CAST SUPPLIES) ×4 IMPLANT
PADDING CAST ABS 3INX4YD NS (CAST SUPPLIES)
PADDING CAST ABS 4INX4YD NS (CAST SUPPLIES)
PADDING CAST ABS COTTON 3X4 (CAST SUPPLIES) ×1 IMPLANT
PADDING CAST ABS COTTON 4X4 ST (CAST SUPPLIES) ×1 IMPLANT
PADDING CAST COTTON 3X4 STRL (CAST SUPPLIES) ×6
PASSER SUT SWANSON 36MM LOOP (INSTRUMENTS) IMPLANT
SHEET MEDIUM DRAPE 40X70 STRL (DRAPES) IMPLANT
SLING ARM FOAM STRAP LRG (SOFTGOODS) ×2 IMPLANT
SPLINT FIBERGLASS 3X35 (CAST SUPPLIES) ×2 IMPLANT
SPLINT PLASTER CAST XFAST 3X15 (CAST SUPPLIES) IMPLANT
SPLINT PLASTER XTRA FASTSET 3X (CAST SUPPLIES)
STOCKINETTE 4X48 STRL (DRAPES) ×3 IMPLANT
STOCKINETTE SYNTHETIC 3 UNSTER (CAST SUPPLIES) ×3 IMPLANT
SUCTION FRAZIER TIP 10 FR DISP (SUCTIONS) ×1 IMPLANT
SUT BONE WAX W31G (SUTURE) IMPLANT
SUT FIBERWIRE 3-0 18 TAPR NDL (SUTURE)
SUT FIBERWIRE 4-0 18 TAPR NDL (SUTURE)
SUT PROLENE 2 0 SH DA (SUTURE) ×2 IMPLANT
SUT PROLENE 4 0 PS 2 18 (SUTURE) ×2 IMPLANT
SUT PROLENE 5 0 P 3 (SUTURE) IMPLANT
SUT VIC AB 4-0 P-3 18XBRD (SUTURE) IMPLANT
SUT VIC AB 4-0 P3 18 (SUTURE)
SUTURE FIBERWR 3-0 18 TAPR NDL (SUTURE) IMPLANT
SUTURE FIBERWR 4-0 18 TAPR NDL (SUTURE) IMPLANT
SYR BULB 3OZ (MISCELLANEOUS) ×3 IMPLANT
SYR CONTROL 10ML LL (SYRINGE) ×4 IMPLANT
TAPE SURG TRANSPORE 1 IN (GAUZE/BANDAGES/DRESSINGS) ×2 IMPLANT
TAPE SURGICAL TRANSPORE 1 IN (GAUZE/BANDAGES/DRESSINGS) ×1
TOWEL OR 17X24 6PK STRL BLUE (TOWEL DISPOSABLE) ×6 IMPLANT
TOWEL OR NON WOVEN STRL DISP B (DISPOSABLE) ×3 IMPLANT
TRAY DSU PREP LF (CUSTOM PROCEDURE TRAY) ×3 IMPLANT
TUBE CONNECTING 20X1/4 (TUBING) ×1 IMPLANT
UNDERPAD 30X30 INCONTINENT (UNDERPADS AND DIAPERS) ×3 IMPLANT

## 2014-05-16 NOTE — Transfer of Care (Signed)
Immediate Anesthesia Transfer of Care Note  Patient: Mario Smith  Procedure(s) Performed: Procedure(s): RIGHT RING FINGER FLEXOR TENDON REPAIR WITH PULL OUT BUTTON (Right)  Patient Location: PACU  Anesthesia Type:GA combined with regional for post-op pain  Level of Consciousness: awake, sedated and patient cooperative  Airway & Oxygen Therapy: Patient Spontanous Breathing and Patient connected to face mask oxygen  Post-op Assessment: Report given to PACU RN and Post -op Vital signs reviewed and stable  Post vital signs: Reviewed and stable  Complications: No apparent anesthesia complications

## 2014-05-16 NOTE — Anesthesia Postprocedure Evaluation (Signed)
Anesthesia Post Note  Patient: Mario Smith  Procedure(s) Performed: Procedure(s) (LRB): RIGHT RING FINGER FLEXOR TENDON REPAIR WITH PULL OUT BUTTON (Right)  Anesthesia type: general  Patient location: PACU  Post pain: Pain level controlled  Post assessment: Patient's Cardiovascular Status Stable  Last Vitals:  Filed Vitals:   05/16/14 1336  BP:   Pulse: 93  Temp:   Resp: 13    Post vital signs: Reviewed and stable  Level of consciousness: sedated  Complications: No apparent anesthesia complications

## 2014-05-16 NOTE — Op Note (Signed)
See Dictation #[id number] Amedeo Plenty MD

## 2014-05-16 NOTE — Op Note (Deleted)
NAMEBlanton, Kardell Smith                 ACCOUNT NO.:  [id number]  MEDICAL RECORD NO.:  [id number]  LOCATION:  DOIB                          FACILITY:  [facility]  PHYSICIAN:  Satira Anis. Dearis Danis, M.D.DATE OF BIRTH:  1940/07/17  DATE OF PROCEDURE: DATE OF DISCHARGE:  05/15/2014                              OPERATIVE REPORT   PREOPERATIVE DIAGNOSIS:  Right ring finger [place] finger with bony avulsion entrapped at the PIP level.  POSTOPERATIVE DIAGNOSIS:  Right ring finger [place] finger with bony avulsion entrapped at the PIP level.  PROCEDURE: 1. Zone 1 flexor digitorum profundus repair with pullout button     technique, right ring finger. 2. AP lateral and oblique x-rays performed, examined, and interpreted     by myself, right ring finger. 3. Extensive flexor tenolysis, tenosynovectomy, right ring finger     secondary to delayed [place] finger presentation and entrapment of     the tendon.  SURGEON:  Satira Anis. Amedeo Plenty, M.D.  ASSISTANT:  None.  COMPLICATION:  None.  ANESTHESIA:  General, preoperative block.  TOURNIQUET TIME:  An hour.  INDICATIONS:  Mario Smith 73 year old male presents with the above- mentioned diagnosis, is a late presentation of a [place] finger; however, he does have a bony fleck which I feel has likely entrapped the tendon at the PIP level rendering repair, possible perhaps.  We discussed the relevant pathophysiology merits of surgery and he desires to proceed with attempted reconstruction.  He understands the necessary pullout button technique, etc. in the future.  OPERATION IN DETAIL:  The patient was seen by myself and Anesthesia, taken to the operative arena and underwent smooth induction of general anesthetic.  Preoperatively, a block was placed.  He was prepped and draped in a sterile fashion with Betadine scrub and paint.  Following this, outline marks were made.  Modified Brunner incision was made under 250 mmHg tourniquet control.  At this time, I  performed a skin flap elevation followed by an extensive tenolysis tenosynovectomy of the FDP tendon.  The bony fleck was entrapped around A4, and it completely destroyed A4 pulley for the most part rendering it incompetent.  We released the A4 pulley or what was left of it mobilized the tendon and placed a modified 2-0 Prolene in a crisscross fashion for pull out technique.  I then prepared the distal phalanx, checked this under x-ray to place 2 Keith needles through the distal phalanx and threaded the 2-0 Prolene through the [place] needles.  These were then advanced and following this, a proper tension was placed and a pullout button was tied over the top of the nail.  X-rays, AP, lateral, and oblique were taken and it showed excellent position of the bony fleck which was in excellent position.  I was quite pleased this in the findings.  Hopefully, this will go on to heal without significant difficulty or problems and the patient will have an excellent result.  I would give him a guarded prognosis given the delay in presentation, his age and other mitigating factors; however, I was quite pleased with competency of the repair.  The A4 pulley was of poor quality, thus I would expect a little bit of fullness  over the line in the A4 pulley.  We will plan for passive range of motion and a Duran program for the most part for this gentleman.  I will remove the button at 6 weeks, and make sure that he understands cleansing techniques, etc.  He will be discharged home on Keflex and Nucynta.  He will notify me should any problems occur.  It was a pleasure to see him today and participate in his care.  All questions have been encouraged and answered.     Satira Anis. Amedeo Plenty, M.D.     [facility]  D:  05/16/2014  T:  05/16/2014  Job:  [id number]

## 2014-05-16 NOTE — Op Note (Signed)
[name], Amber                 ACCOUNT NO.:  [id number]  MEDICAL RECORD NO.:  [id number]  LOCATION:                                FACILITY:  [facility]  PHYSICIAN:  Mario Smith, M.D.DATE OF BIRTH:  [birth year]  DATE OF PROCEDURE: DATE OF DISCHARGE:  05/16/2014                              OPERATIVE REPORT   PREOPERATIVE DIAGNOSIS:  Right ring finger Mario Smith finger with bony avulsion entrapped at the PIP level.  POSTOPERATIVE DIAGNOSIS:  Right ring finger Mario Smith finger with bony avulsion entrapped at the PIP level.  PROCEDURE: 1. Zone 1 flexor digitorum profundus repair with pullout button     technique, right ring finger. 2. AP lateral and oblique x-rays performed, examined, and interpreted     by myself, right ring finger. 3. Extensive flexor tenolysis, tenosynovectomy, right ring finger     secondary to delayed Mario Smith finger presentation and entrapment of     the tendon.  SURGEON:  Mario Anis. Amedeo Plenty, M.D.  ASSISTANT:  None.  COMPLICATION:  None.  ANESTHESIA:  General, preoperative block.  TOURNIQUET TIME:  An hour.  INDICATIONS:  Mario Smith 73 year old male presents with the above- mentioned diagnosis, is a late presentation of a Mario Smith finger; however, he does have a bony fleck which I feel has likely entrapped the tendon at the PIP level rendering repair, possible perhaps.  We discussed the relevant pathophysiology merits of surgery and he desires to proceed with attempted reconstruction.  He understands the necessary pullout button technique, etc. in the future.  OPERATION IN DETAIL:  The patient was seen by myself and Anesthesia, taken to the operative arena and underwent smooth induction of general anesthetic.  Preoperatively, a block was placed.  He was prepped and draped in a sterile fashion with Betadine scrub and paint.  Following this, outline marks were made.  Modified Brunner incision was made under 250 mmHg tourniquet control.  At this time, I  performed a skin flap elevation followed by an extensive tenolysis tenosynovectomy of the FDP tendon.  The bony fleck was entrapped around A4, and it completely destroyed A4 pulley for the most part rendering it incompetent.  We released the A4 pulley or what was left of it mobilized the tendon and placed a modified 2-0 Prolene in a crisscross fashion for pull out technique.  I then prepared the distal phalanx, checked this under x-ray to place 2 Keith needles through the distal phalanx and threaded the 2-0 Prolene through the [place] needles.  These were then advanced and following this, a proper tension was placed and a pullout button was tied over the top of the nail.  X-rays, AP, lateral, and oblique were taken and it showed excellent position of the bony fleck which was in excellent position.  I was quite pleased this in the findings.  Hopefully, this will go on to heal without significant difficulty or problems and the patient will have an excellent result.  I would give him a guarded prognosis given the delay in presentation, his age and other mitigating factors; however, I was quite pleased with competency of the repair.  The A4 pulley was of poor quality, thus I would expect a  little bit of fullness over the line in the A4 pulley.  We will plan for passive range of motion and a Duran program for the most part for this gentleman.  I will remove the button at 6 weeks, and make sure that he understands cleansing techniques, etc.  He will be discharged home on Keflex and Nucynta.  He will notify me should any problems occur.  It was a pleasure to see him today and participate in his care.  All questions have been encouraged and answered.     Mario Anis. Amedeo Plenty, M.D.     [facility]  D:  05/16/2014  T:  05/16/2014  Job:  [id number]

## 2014-05-16 NOTE — Anesthesia Procedure Notes (Addendum)
Anesthesia Regional Block:  Supraclavicular block  Pre-Anesthetic Checklist: ,, timeout performed, Correct Patient, Correct Site, Correct Laterality, Correct Procedure, Correct Position, site marked, Risks and benefits discussed,  Surgical consent,  Pre-op evaluation,  At surgeon's request and post-op pain management  Laterality: Right  Prep: chloraprep       Needles:  Injection technique: Single-shot  Needle Type: Echogenic Stimulator Needle     Needle Length: 5cm 5 cm Needle Gauge: 22 and 22 G    Additional Needles:  Procedures: ultrasound guided (picture in chart) and nerve stimulator Supraclavicular block  Nerve Stimulator or Paresthesia:  Response: bicep contraction, 0.45 mA,   Additional Responses:   Narrative:  Start time: 05/16/2014 11:01 AM End time: 05/16/2014 11:11 AM Injection made incrementally with aspirations every 5 mL.  Performed by: Personally  Anesthesiologist: Duane Boston  Additional Notes: Functioning IV was confirmed and monitors applied.  A 26mm 22ga echogenic arrow stimulator was used. Sterile prep and drape,hand hygiene and sterile gloves were used.Ultrasound guidance: relevant anatomy identified, needle position confirmed, local anesthetic spread visualized around nerve(s)., vascular puncture avoided.  Image printed for medical record.  Negative aspiration and negative test dose prior to incremental administration of local anesthetic. The patient tolerated the procedure well.   Procedure Name: LMA Insertion Date/Time: 05/16/2014 11:54 AM Performed by: Lyndee Leo Pre-anesthesia Checklist: Patient identified, Emergency Drugs available, Suction available and Patient being monitored Patient Re-evaluated:Patient Re-evaluated prior to inductionOxygen Delivery Method: Circle System Utilized Preoxygenation: Pre-oxygenation with 100% oxygen Intubation Type: IV induction Ventilation: Mask ventilation without difficulty LMA: LMA inserted LMA  Size: 4.0 Number of attempts: 1 Airway Equipment and Method: bite block Placement Confirmation: positive ETCO2 Tube secured with: Tape Dental Injury: Teeth and Oropharynx as per pre-operative assessment

## 2014-05-16 NOTE — Anesthesia Preprocedure Evaluation (Signed)
Anesthesia Evaluation  Patient identified by MRN, date of birth, ID band Patient awake    Reviewed: Allergy & Precautions, NPO status , Patient's Chart, lab work & pertinent test results  History of Anesthesia Complications (+) PONV and history of anesthetic complications  Airway Mallampati: II  TM Distance: >3 FB Neck ROM: Full    Dental  (+) Teeth Intact, Dental Advisory Given   Pulmonary former smoker,    Pulmonary exam normal       Cardiovascular hypertension, Pt. on medications     Neuro/Psych negative neurological ROS  negative psych ROS   GI/Hepatic negative GI ROS, Neg liver ROS,   Endo/Other  negative endocrine ROS  Renal/GU negative Renal ROS     Musculoskeletal   Abdominal   Peds  Hematology   Anesthesia Other Findings   Reproductive/Obstetrics                             Anesthesia Physical Anesthesia Plan  ASA: III  Anesthesia Plan: General   Post-op Pain Management:    Induction: Intravenous  Airway Management Planned: LMA  Additional Equipment:   Intra-op Plan:   Post-operative Plan: Extubation in OR  Informed Consent: I have reviewed the patients History and Physical, chart, labs and discussed the procedure including the risks, benefits and alternatives for the proposed anesthesia with the patient or authorized representative who has indicated his/her understanding and acceptance.   Dental advisory given  Plan Discussed with: CRNA, Anesthesiologist and Surgeon  Anesthesia Plan Comments:         Anesthesia Quick Evaluation

## 2014-05-16 NOTE — Discharge Instructions (Signed)
We recommend that you to take vitamin C 1000 mg a day to promote healing we also recommend that if you require her pain medicine that he take a stool softener to prevent constipation as most pain medicines will have constipation side effects. We recommend either Peri-Colace or Senokot and recommend that you also consider adding MiraLAX to prevent the constipation affects from pain medicine if you are required to use them. These medicines are over the counter and maybe purchased at a local pharmacy. Keep bandage clean and dry.  Call for any problems.  No smoking.  Criteria for driving a car: you should be off your pain medicine for 7-8 hours, able to drive one handed(confident), thinking clearly and feeling able in your judgement to drive. Continue elevation as it will decrease swelling.  If instructed by MD move your fingers within the confines of the bandage/splint.  Use ice if instructed by your MD. Call immediately for any sudden loss of feeling in your hand/arm or change in functional abilities of the extremity.  Regional Anesthesia Blocks  1. Numbness or the inability to move the "blocked" extremity may last from 3-48 hours after placement. The length of time depends on the medication injected and your individual response to the medication. If the numbness is not going away after 48 hours, call your surgeon.  2. The extremity that is blocked will need to be protected until the numbness is gone and the  Strength has returned. Because you cannot feel it, you will need to take extra care to avoid injury. Because it may be weak, you may have difficulty moving it or using it. You may not know what position it is in without looking at it while the block is in effect.  3. For blocks in the legs and feet, returning to weight bearing and walking needs to be done carefully. You will need to wait until the numbness is entirely gone and the strength has returned. You should be able to move your leg and foot  normally before you try and bear weight or walk. You will need someone to be with you when you first try to ensure you do not fall and possibly risk injury.  4. Bruising and tenderness at the needle site are common side effects and will resolve in a few days.  5. Persistent numbness or new problems with movement should be communicated to the surgeon or the [facility] [phone] [place] [phone]).   Post Anesthesia Home Care Instructions  Activity: Get plenty of rest for the remainder of the day. A responsible adult should stay with you for 24 hours following the procedure.  For the next 24 hours, DO NOT: -Drive a car -Paediatric nurse -Drink alcoholic beverages -Take any medication unless instructed by your physician -Make any legal decisions or sign important papers.  Meals: Start with liquid foods such as gelatin or soup. Progress to regular foods as tolerated. Avoid greasy, spicy, heavy foods. If nausea and/or vomiting occur, drink only clear liquids until the nausea and/or vomiting subsides. Call your physician if vomiting continues.  Special Instructions/Symptoms: Your throat may feel dry or sore from the anesthesia or the breathing tube placed in your throat during surgery. If this causes discomfort, gargle with warm salt water. The discomfort should disappear within 24 hours.

## 2014-05-16 NOTE — H&P (Signed)
Sayeed R Abid is an 73 y.o. male.   Chief Complaint: Patient presents for right ring finger reconstruction HPI: Plan for right ring finger surgical evaluation and possible flexor tendon reconstruction as necessary secondary to avulsion  Past Medical History  Diagnosis Date  . Essential hypertension, benign   . Other and unspecified hyperlipidemia   . ED (erectile dysfunction)   . Colon polyp   . Thrombocytopenia   . Back pain   . PONV (postoperative nausea and vomiting)     Past Surgical History  Procedure Laterality Date  . Cataract extraction    . Spinal fusion  2001    cervical  . Colonoscopy      Family History  Problem Relation Age of Onset  . Heart disease Mother   . COPD Mother   . Heart disease Father   . COPD Father   . Cancer Sister     colon cancer  . Emphysema Sister   . Cancer Brother     prostate  . Emphysema Sister    Social History:  reports that he quit smoking about 21 years ago. His smoking use included Cigarettes. He smoked 0.00 packs per day. He has never used smokeless tobacco. He reports that he does not drink alcohol or use illicit drugs.  Allergies:  Allergies  Allergen Reactions  . Codeine   . Hydrocodone   . Niaspan [Niacin Er] Swelling  . Simvastatin Other (See Comments)    Leg cramps  . Sulfa Antibiotics     Medications Prior to Admission  Medication Sig Dispense Refill  . aspirin 81 MG EC tablet Take 81 mg by mouth daily.      . Cholecalciferol (VITAMIN D3) 2000 UNITS TABS Take 1 tablet by mouth daily.      . Coenzyme Q10 (CO Q 10 PO) Take 2 capsules by mouth daily.     Marland Kitchen losartan (COZAAR) 100 MG tablet TAKE 1 TABLET BY MOUTH EVERY DAY 90 tablet 0  . Omega-3 Fatty Acids (FISH OIL) 1000 MG CAPS Take 1 capsule by mouth 2 (two) times daily.      . rosuvastatin (CRESTOR) 20 MG tablet Take 1 tablet (20 mg total) by mouth daily. 28 tablet 0    Results for orders placed or performed during the hospital encounter of 05/16/14 (from the  past 48 hour(s))  Hemoglobin-hemacue, POC     Status: None   Collection Time: 05/16/14  9:34 AM  Result Value Ref Range   Hemoglobin 15.5 13.0 - 17.0 g/dL   No results found.  Review of Systems  HENT: Negative.   Eyes: Negative.   Gastrointestinal: Negative.   Genitourinary: Negative.   Skin: Negative.   Neurological: Negative.     Blood pressure 139/71, pulse 97, temperature 97.6 F (36.4 C), temperature source Oral, resp. rate 13, height 5\' 8"  (1.727 m), weight 76.885 kg (169 lb 8 oz), SpO2 99 %. Physical Exam  Right ring finger flexor rupture Neurovascularly intact right hand The patient is alert and oriented in no acute distress the patient complains of pain in the affected upper extremity.  The patient is noted to have a normal HEENT exam.  Lung fields show equal chest expansion and no shortness of breath  abdomen exam is nontender without distention.  Lower extremity examination does not show any fracture dislocation or blood clot symptoms.  Pelvis is stable neck and back are stable and nontender Assessment/Plan We are planning surgery for your upper extremity. The risk and benefits of  surgery include risk of bleeding infection anesthesia damage to normal structures and failure of the surgery to accomplish its intended goals of relieving symptoms and restoring function with this in mind we'll going to proceed. I have specifically discussed with the patient the pre-and postoperative regime and the does and don'ts and risk and benefits in great detail. Risk and benefits of surgery also include risk of dystrophy chronic nerve pain failure of the healing process to go onto completion and other inherent risks of surgery The relavent the pathophysiology of the disease/injury process, as well as the alternatives for treatment and postoperative course of action has been discussed in great detail with the patient who desires to proceed.  We will do everything in our power to help you  (the patient) restore function to the upper extremity. Is a pleasure to see this patient today.   Martavious Hartel III,Lorann Tani M 05/16/2014, 11:40 AM

## 2014-05-16 NOTE — Progress Notes (Signed)
Assisted Dr. Singer with right, ultrasound guided, supraclavicular block. Side rails up, monitors on throughout procedure. See vital signs in flow sheet. Tolerated Procedure well. 

## 2014-05-19 ENCOUNTER — Encounter (HOSPITAL_BASED_OUTPATIENT_CLINIC_OR_DEPARTMENT_OTHER): Payer: Self-pay | Admitting: Orthopedic Surgery

## 2014-05-22 ENCOUNTER — Telehealth: Payer: Self-pay | Admitting: Family Medicine

## 2014-05-22 NOTE — Telephone Encounter (Signed)
Samples of Crestor 20, qty 21 ready.

## 2014-06-09 ENCOUNTER — Other Ambulatory Visit (INDEPENDENT_AMBULATORY_CARE_PROVIDER_SITE_OTHER): Payer: Medicare Other

## 2014-06-09 DIAGNOSIS — R972 Elevated prostate specific antigen [PSA]: Secondary | ICD-10-CM

## 2014-06-09 DIAGNOSIS — I10 Essential (primary) hypertension: Secondary | ICD-10-CM

## 2014-06-09 DIAGNOSIS — E785 Hyperlipidemia, unspecified: Secondary | ICD-10-CM

## 2014-06-09 DIAGNOSIS — E559 Vitamin D deficiency, unspecified: Secondary | ICD-10-CM

## 2014-06-09 LAB — POCT CBC
Granulocyte percent: 69.9 %G (ref 37–80)
HEMATOCRIT: 47.4 % (ref 43.5–53.7)
HEMOGLOBIN: 14.8 g/dL (ref 14.1–18.1)
Lymph, poc: 1.8 (ref 0.6–3.4)
MCH: 29.9 pg (ref 27–31.2)
MCHC: 31.2 g/dL — AB (ref 31.8–35.4)
MCV: 96 fL (ref 80–97)
MPV: 9.7 fL (ref 0–99.8)
PLATELET COUNT, POC: 147 10*3/uL (ref 142–424)
POC Granulocyte: 4.9 (ref 2–6.9)
POC LYMPH %: 25.3 % (ref 10–50)
RBC: 4.9 M/uL (ref 4.69–6.13)
RDW, POC: 15 %
WBC: 7 10*3/uL (ref 4.6–10.2)

## 2014-06-10 ENCOUNTER — Encounter: Payer: Self-pay | Admitting: Family Medicine

## 2014-06-10 ENCOUNTER — Ambulatory Visit (INDEPENDENT_AMBULATORY_CARE_PROVIDER_SITE_OTHER): Payer: Medicare Other | Admitting: Family Medicine

## 2014-06-10 VITALS — BP 142/72 | HR 68 | Temp 97.0°F | Ht 68.0 in | Wt 174.0 lb

## 2014-06-10 DIAGNOSIS — Z9889 Other specified postprocedural states: Secondary | ICD-10-CM

## 2014-06-10 DIAGNOSIS — I10 Essential (primary) hypertension: Secondary | ICD-10-CM

## 2014-06-10 DIAGNOSIS — R972 Elevated prostate specific antigen [PSA]: Secondary | ICD-10-CM

## 2014-06-10 DIAGNOSIS — E785 Hyperlipidemia, unspecified: Secondary | ICD-10-CM

## 2014-06-10 DIAGNOSIS — E559 Vitamin D deficiency, unspecified: Secondary | ICD-10-CM

## 2014-06-10 LAB — BMP8+EGFR
BUN/Creatinine Ratio: 11 (ref 10–22)
BUN: 12 mg/dL (ref 8–27)
CALCIUM: 9.4 mg/dL (ref 8.6–10.2)
CO2: 28 mmol/L (ref 18–29)
CREATININE: 1.05 mg/dL (ref 0.76–1.27)
Chloride: 103 mmol/L (ref 97–108)
GFR calc Af Amer: 80 mL/min/{1.73_m2} (ref >59)
GFR calc non Af Amer: 70 mL/min/{1.73_m2} (ref >59)
GLUCOSE: 97 mg/dL (ref 65–99)
POTASSIUM: 4.6 mmol/L (ref 3.5–5.2)
Sodium: 144 mmol/L (ref 134–144)

## 2014-06-10 LAB — HEPATIC FUNCTION PANEL
ALT: 9 IU/L (ref 0–44)
AST: 16 IU/L (ref 0–40)
Albumin: 4.3 g/dL (ref 3.5–4.8)
Alkaline Phosphatase: 70 IU/L (ref 39–117)
Bilirubin, Direct: 0.19 mg/dL (ref 0.00–0.40)
Total Bilirubin: 0.6 mg/dL (ref 0.0–1.2)
Total Protein: 6.5 g/dL (ref 6.0–8.5)

## 2014-06-10 LAB — PSA, TOTAL AND FREE
PSA FREE PCT: 12 %
PSA FREE: 0.24 ng/mL
PSA: 2 ng/mL (ref 0.0–4.0)

## 2014-06-10 LAB — NMR, LIPOPROFILE
CHOLESTEROL: 130 mg/dL (ref 100–199)
HDL CHOLESTEROL BY NMR: 47 mg/dL (ref >39)
HDL Particle Number: 28.7 umol/L — ABNORMAL LOW (ref >=30.5)
LDL Particle Number: 631 nmol/L (ref <1000)
LDL Size: 20.4 nm (ref >20.5)
LDL-C: 69 mg/dL (ref 0–99)
LP-IR Score: 37 (ref <=45)
Small LDL Particle Number: 280 nmol/L (ref <=527)
TRIGLYCERIDES BY NMR: 68 mg/dL (ref 0–149)

## 2014-06-10 LAB — VITAMIN D 25 HYDROXY (VIT D DEFICIENCY, FRACTURES): Vit D, 25-Hydroxy: 48.8 ng/mL (ref 30.0–100.0)

## 2014-06-10 NOTE — Patient Instructions (Addendum)
Medicare Annual Wellness Visit  [facility] and the medical providers at [facility] strive to bring you the best medical care.  In doing so we not only want to address your current medical conditions and concerns but also to detect new conditions early and prevent illness, disease and health-related problems.    Medicare offers a yearly Wellness Visit which allows our clinical staff to assess your need for preventative services including immunizations, lifestyle education, counseling to decrease risk of preventable diseases and screening for fall risk and other medical concerns.    This visit is provided free of charge (no copay) for all Medicare recipients. The clinical pharmacists at [facility] have begun to conduct these Wellness Visits which will also include a thorough review of all your medications.    As you primary medical provider recommend that you make an appointment for your Annual Wellness Visit if you have not done so already this year.  You may set up this appointment before you leave today or you may call back ([phone]) and schedule an appointment.  Please make sure when you call that you mention that you are scheduling your Annual Wellness Visit with the clinical pharmacist so that the appointment may be made for the proper length of time.     Continue current medications. Continue good therapeutic lifestyle changes which include good diet and exercise. Fall precautions discussed with patient. If an FOBT was given today- please return it to our front desk. If you are over 85 years old - you may need Prevnar 58 or the adult Pneumonia vaccine.  Flu Shots will be available at our office starting mid- September. Please call and schedule a FLU CLINIC APPOINTMENT.   Continue to follow up with hand surgeon Get as much exercise as possible, i.e. walking etc. Drink plenty of fluids Continue to monitor blood pressures  periodically at home

## 2014-06-10 NOTE — Progress Notes (Signed)
Subjective:    Patient ID: Mario Smith, male    DOB: 1940-07-19, 74 y.o.   MRN: [id number]  HPI Pt here for follow up and management of chronic medical problems. The patient recently had flexor tendon repair of the right ring finger which was injured from a tree stand fall. He has had lab work recently and this will be reviewed with him today he will get a prostate exam today also. The patient brings in home blood pressures for review and all of these blood pressures are in the 102-133 range over the 60-70 range. The patient has a brace on his right hand from the recent surgical repair of the flexor tendon of the ring finger.        Patient Active Problem List   Diagnosis Date Noted  . Hyperlipidemia 05/27/2013  . Vitamin D deficiency 05/27/2013  . Hypertension 02/11/2010   Outpatient Encounter Prescriptions as of 06/10/2014  Medication Sig  . aspirin 81 MG EC tablet Take 81 mg by mouth daily.    . Cholecalciferol (VITAMIN D3) 2000 UNITS TABS Take 1 tablet by mouth daily.    . Coenzyme Q10 (CO Q 10 PO) Take 2 capsules by mouth daily.   Marland Kitchen losartan (COZAAR) 100 MG tablet TAKE 1 TABLET BY MOUTH EVERY DAY  . Omega-3 Fatty Acids (FISH OIL) 1000 MG CAPS Take 1 capsule by mouth 2 (two) times daily.    . rosuvastatin (CRESTOR) 20 MG tablet Take 1 tablet (20 mg total) by mouth daily.  . [DISCONTINUED] cephALEXin (KEFLEX) 500 MG capsule Take 1 capsule (500 mg total) by mouth 4 (four) times daily.  . [DISCONTINUED] tapentadol (NUCYNTA) 50 MG TABS tablet Take 2 tablets (100 mg total) by mouth every 4 (four) hours as needed.    Review of Systems  Constitutional: Negative.   HENT: Negative.   Eyes: Negative.   Respiratory: Negative.   Cardiovascular: Negative.   Gastrointestinal: Negative.   Endocrine: Negative.   Genitourinary: Negative.   Musculoskeletal: Positive for arthralgias (recent right hand injury and surgery).  Skin: Negative.   Allergic/Immunologic: Negative.     Neurological: Negative.   Hematological: Negative.   Psychiatric/Behavioral: Negative.        Objective:   Physical Exam  Constitutional: He is oriented to person, place, and time. He appears well-developed and well-nourished.  HENT:  Head: Normocephalic and atraumatic.  Right Ear: External ear normal.  Left Ear: External ear normal.  Nose: Nose normal.  Mouth/Throat: Oropharynx is clear and moist. No oropharyngeal exudate.  Minimal nasal congestion bilaterally  Eyes: Conjunctivae and EOM are normal. Pupils are equal, round, and reactive to light. Right eye exhibits no discharge. Left eye exhibits no discharge. No scleral icterus.  Neck: Normal range of motion. Neck supple. No thyromegaly present.  No carotid bruits or anterior cervical adenopathy  Cardiovascular: Normal rate, regular rhythm, normal heart sounds and intact distal pulses.  Exam reveals no gallop and no friction rub.   No murmur heard. The heart has a regular rate and rhythm at 60-72/m there are no murmurs.  Pulmonary/Chest: Effort normal and breath sounds normal. No respiratory distress. He has no wheezes. He has no rales. He exhibits no tenderness.  Abdominal: Soft. Bowel sounds are normal. He exhibits no mass. There is no tenderness. There is no rebound and no guarding.  Genitourinary: Rectum normal, prostate normal and penis normal.  The prostate is minimally enlarged. There are no lumps or masses. There are no rectal masses. There are  no inguinal hernias. External genitalia were within normal limits. There were no inguinal nodes.  Musculoskeletal: Normal range of motion. He exhibits no edema or tenderness.  The patient has a splint on the right hand forearm and fingers.  Lymphadenopathy:    He has no cervical adenopathy.  Neurological: He is alert and oriented to person, place, and time. He has normal reflexes. No cranial nerve deficit.  Skin: Skin is warm and dry. No rash noted. No erythema. No pallor.   Psychiatric: He has a normal mood and affect. His behavior is normal. Judgment and thought content normal.  Nursing note and vitals reviewed.  BP 142/72 mmHg  Pulse 68  Temp(Src) 97 F (36.1 C) (Oral)  Ht 5\' 8"  (1.727 m)  Wt 174 lb (78.926 kg)  BMI 26.46 kg/m2        Assessment & Plan:  1. Hyperlipidemia  2. Hypertension  3. Vitamin D deficiency  4. Elevated PSA -The PSA is now normal on the most recent report  5. H/O hand surgery -Follow-up with orthopedist as planned  Patient Instructions                       Medicare Annual Wellness Visit  [facility] and the medical providers at [facility] strive to bring you the best medical care.  In doing so we not only want to address your current medical conditions and concerns but also to detect new conditions early and prevent illness, disease and health-related problems.    Medicare offers a yearly Wellness Visit which allows our clinical staff to assess your need for preventative services including immunizations, lifestyle education, counseling to decrease risk of preventable diseases and screening for fall risk and other medical concerns.    This visit is provided free of charge (no copay) for all Medicare recipients. The clinical pharmacists at [facility] have begun to conduct these Wellness Visits which will also include a thorough review of all your medications.    As you primary medical provider recommend that you make an appointment for your Annual Wellness Visit if you have not done so already this year.  You may set up this appointment before you leave today or you may call back [phone]) and schedule an appointment.  Please make sure when you call that you mention that you are scheduling your Annual Wellness Visit with the clinical pharmacist so that the appointment may be made for the proper length of time.     Continue current medications. Continue good therapeutic  lifestyle changes which include good diet and exercise. Fall precautions discussed with patient. If an FOBT was given today- please return it to our front desk. If you are over 13 years old - you may need Prevnar 10 or the adult Pneumonia vaccine.  Flu Shots will be available at our office starting mid- September. Please call and schedule a FLU CLINIC APPOINTMENT.   Continue to follow up with hand surgeon Get as much exercise as possible, i.e. walking etc. Drink plenty of fluids Continue to monitor blood pressures periodically at home   Arrie Senate MD

## 2014-06-30 ENCOUNTER — Telehealth: Payer: Self-pay | Admitting: Family Medicine

## 2014-06-30 MED ORDER — ROSUVASTATIN CALCIUM 20 MG PO TABS: 20.0000 mg | ORAL_TABLET | Freq: Every day | ORAL | Status: DC

## 2014-06-30 NOTE — Telephone Encounter (Signed)
Patient aware that we do not have any samples of crestor.

## 2014-06-30 NOTE — Addendum Note (Signed)
Addended by: Thana Ates on: 06/30/2014 03:57 PM   Modules accepted: Orders

## 2014-08-08 ENCOUNTER — Telehealth: Payer: Self-pay | Admitting: Family Medicine

## 2014-08-08 NOTE — Telephone Encounter (Signed)
Patient aware we have no crestor samples

## 2014-08-13 ENCOUNTER — Other Ambulatory Visit: Payer: Self-pay | Admitting: Family Medicine

## 2014-08-13 NOTE — Telephone Encounter (Signed)
Pt notified no samples available 

## 2014-08-14 ENCOUNTER — Telehealth: Payer: Self-pay | Admitting: Family Medicine

## 2014-08-14 NOTE — Telephone Encounter (Signed)
Appointment scheduled for tomorrow with Mario Smith.

## 2014-08-15 ENCOUNTER — Encounter: Payer: Self-pay | Admitting: Family

## 2014-08-15 ENCOUNTER — Ambulatory Visit (INDEPENDENT_AMBULATORY_CARE_PROVIDER_SITE_OTHER): Payer: Medicare Other | Admitting: Family

## 2014-08-15 VITALS — BP 138/66 | HR 86 | Temp 97.2°F | Ht 68.0 in | Wt 175.2 lb

## 2014-08-15 DIAGNOSIS — R05 Cough: Secondary | ICD-10-CM

## 2014-08-15 DIAGNOSIS — J069 Acute upper respiratory infection, unspecified: Secondary | ICD-10-CM | POA: Diagnosis not present

## 2014-08-15 DIAGNOSIS — R059 Cough, unspecified: Secondary | ICD-10-CM

## 2014-08-15 MED ORDER — AZITHROMYCIN 250 MG PO TABS: ORAL_TABLET | ORAL | Status: DC

## 2014-08-15 MED ORDER — ALBUTEROL SULFATE HFA 108 (90 BASE) MCG/ACT IN AERS: 2.0000 | INHALATION_SPRAY | Freq: Four times a day (QID) | RESPIRATORY_TRACT | Status: DC | PRN

## 2014-08-15 MED ORDER — BENZONATATE 200 MG PO CAPS: 200.0000 mg | ORAL_CAPSULE | Freq: Three times a day (TID) | ORAL | Status: DC | PRN

## 2014-08-15 NOTE — Progress Notes (Signed)
Subjective:    Patient ID: KAYHAN BOARDLEY, male    DOB: 09-05-1940, 74 y.o.   MRN: [id number]  Cough This is a new problem. The current episode started in the past 7 days (Sunday). The problem has been waxing and waning. The problem occurs every few minutes. The cough is productive of purulent sputum and productive of brown sputum. Associated symptoms include headaches, myalgias, nasal congestion, a sore throat, shortness of breath and wheezing. Pertinent negatives include no chills, ear congestion, ear pain, fever, postnasal drip or rhinorrhea. The symptoms are aggravated by lying down. He has tried rest and OTC cough suppressant for the symptoms. The treatment provided moderate relief. There is no history of asthma or COPD.      Review of Systems  Constitutional: Negative.  Negative for fever and chills.  HENT: Positive for sore throat. Negative for ear pain, postnasal drip and rhinorrhea.   Respiratory: Positive for cough, shortness of breath and wheezing.   Cardiovascular: Negative.   Gastrointestinal: Negative.   Endocrine: Negative.   Genitourinary: Negative.   Musculoskeletal: Positive for myalgias.  Neurological: Positive for headaches.  Hematological: Negative.   Psychiatric/Behavioral: Negative.   All other systems reviewed and are negative.      Objective:   Physical Exam  Constitutional: He is oriented to person, place, and time. He appears well-developed and well-nourished. No distress.  HENT:  Head: Normocephalic.  Right Ear: External ear normal.  Left Ear: External ear normal.  Nasal passage erythemas with mild swelling  Oropharynx erythemas   Eyes: Pupils are equal, round, and reactive to light. Right eye exhibits no discharge. Left eye exhibits no discharge.  Neck: Normal range of motion. Neck supple. No thyromegaly present.  Cardiovascular: Normal rate, regular rhythm, normal heart sounds and intact distal pulses.   No murmur heard. Pulmonary/Chest: Effort  normal and breath sounds normal. No respiratory distress. He has no wheezes.  Abdominal: Soft. Bowel sounds are normal. He exhibits no distension. There is no tenderness.  Musculoskeletal: Normal range of motion. He exhibits no edema or tenderness.  Neurological: He is alert and oriented to person, place, and time. He has normal reflexes. No cranial nerve deficit.  Skin: Skin is warm and dry. No rash noted. No erythema.  Psychiatric: He has a normal mood and affect. His behavior is normal. Judgment and thought content normal.  Vitals reviewed.   BP 138/66 mmHg  Pulse 86  Temp(Src) 97.2 F (36.2 C) (Oral)  Ht 5\' 8"  (1.727 m)  Wt 175 lb 3.2 oz (79.47 kg)  BMI 26.65 kg/m2       Assessment & Plan:  1. Acute upper respiratory infection -- Take meds as prescribed - Use a cool mist humidifier  -Use saline nose sprays frequently -Saline irrigations of the nose can be very helpful if done frequently.  * 4X daily for 1 week*  * Use of a nettie pot can be helpful with this. Follow directions with this* -Force fluids -For any cough or congestion  Use plain Mucinex- regular strength or max strength is fine   * Children- consult with Pharmacist for dosing -For fever or aces or pains- take tylenol or ibuprofen appropriate for age and weight.  * for fevers greater than 101 orally you may alternate ibuprofen and tylenol every  3 hours. -Throat lozenges if help - azithromycin (ZITHROMAX) 250 MG tablet; Take 500 mg once, then 250 mg for four days  Dispense: 6 tablet; Refill: 0 - benzonatate (TESSALON) 200 MG capsule;  Take 1 capsule (200 mg total) by mouth 3 (three) times daily as needed.  Dispense: 30 capsule; Refill: 1 - albuterol (PROVENTIL HFA;VENTOLIN HFA) 108 (90 BASE) MCG/ACT inhaler; Inhale 2 puffs into the lungs every 6 (six) hours as needed for wheezing or shortness of breath.  Dispense: 1 Inhaler; Refill: 2  2. Cough - azithromycin (ZITHROMAX) 250 MG tablet; Take 500 mg once, then 250  mg for four days  Dispense: 6 tablet; Refill: 0 - benzonatate (TESSALON) 200 MG capsule; Take 1 capsule (200 mg total) by mouth 3 (three) times daily as needed.  Dispense: 30 capsule; Refill: [place], FNP

## 2014-08-15 NOTE — Patient Instructions (Signed)
Upper Respiratory Infection, Adult An upper respiratory infection (URI) is also sometimes known as the common cold. The upper respiratory tract includes the nose, sinuses, throat, trachea, and bronchi. Bronchi are the airways leading to the lungs. Most people improve within 1 week, but symptoms can last up to 2 weeks. A residual cough may last even longer.  CAUSES Many different viruses can infect the tissues lining the upper respiratory tract. The tissues become irritated and inflamed and often become very moist. Mucus production is also common. A cold is contagious. You can easily spread the virus to others by oral contact. This includes kissing, sharing a glass, coughing, or sneezing. Touching your mouth or nose and then touching a surface, which is then touched by another person, can also spread the virus. SYMPTOMS  Symptoms typically develop 1 to 3 days after you come in contact with a cold virus. Symptoms vary from person to person. They may include:  Runny nose.  Sneezing.  Nasal congestion.  Sinus irritation.  Sore throat.  Loss of voice (laryngitis).  Cough.  Fatigue.  Muscle aches.  Loss of appetite.  Headache.  Low-grade fever. DIAGNOSIS  You might diagnose your own cold based on familiar symptoms, since most people get a cold 2 to 3 times a year. Your caregiver can confirm this based on your exam. Most importantly, your caregiver can check that your symptoms are not due to another disease such as strep throat, sinusitis, pneumonia, asthma, or epiglottitis. Blood tests, throat tests, and X-rays are not necessary to diagnose a common cold, but they may sometimes be helpful in excluding other more serious diseases. Your caregiver will decide if any further tests are required. RISKS AND COMPLICATIONS  You may be at risk for a more severe case of the common cold if you smoke cigarettes, have chronic heart disease (such as heart failure) or lung disease (such as asthma), or if  you have a weakened immune system. The very young and very old are also at risk for more serious infections. Bacterial sinusitis, middle ear infections, and bacterial pneumonia can complicate the common cold. The common cold can worsen asthma and chronic obstructive pulmonary disease (COPD). Sometimes, these complications can require emergency medical care and may be life-threatening. PREVENTION  The best way to protect against getting a cold is to practice good hygiene. Avoid oral or hand contact with people with cold symptoms. Wash your hands often if contact occurs. There is no clear evidence that vitamin C, vitamin E, echinacea, or exercise reduces the chance of developing a cold. However, it is always recommended to get plenty of rest and practice good nutrition. TREATMENT  Treatment is directed at relieving symptoms. There is no cure. Antibiotics are not effective, because the infection is caused by a virus, not by bacteria. Treatment may include:  Increased fluid intake. Sports drinks offer valuable electrolytes, sugars, and fluids.  Breathing heated mist or steam (vaporizer or shower).  Eating chicken soup or other clear broths, and maintaining good nutrition.  Getting plenty of rest.  Using gargles or lozenges for comfort.  Controlling fevers with ibuprofen or acetaminophen as directed by your caregiver.  Increasing usage of your inhaler if you have asthma. Zinc gel and zinc lozenges, taken in the first 24 hours of the common cold, can shorten the duration and lessen the severity of symptoms. Pain medicines may help with fever, muscle aches, and throat pain. A variety of non-prescription medicines are available to treat congestion and runny nose. Your caregiver   can make recommendations and may suggest nasal or lung inhalers for other symptoms.  HOME CARE INSTRUCTIONS   Only take over-the-counter or prescription medicines for pain, discomfort, or fever as directed by your  caregiver.  Use a warm mist humidifier or inhale steam from a shower to increase air moisture. This may keep secretions moist and make it easier to breathe.  Drink enough water and fluids to keep your urine clear or pale yellow.  Rest as needed.  Return to work when your temperature has returned to normal or as your caregiver advises. You may need to stay home longer to avoid infecting others. You can also use a face mask and careful hand washing to prevent spread of the virus. SEEK MEDICAL CARE IF:   After the first few days, you feel you are getting worse rather than better.  You need your caregiver's advice about medicines to control symptoms.  You develop chills, worsening shortness of breath, or brown or red sputum. These may be signs of pneumonia.  You develop yellow or brown nasal discharge or pain in the face, especially when you bend forward. These may be signs of sinusitis.  You develop a fever, swollen neck glands, pain with swallowing, or white areas in the back of your throat. These may be signs of strep throat. SEEK IMMEDIATE MEDICAL CARE IF:   You have a fever.  You develop severe or persistent headache, ear pain, sinus pain, or chest pain.  You develop wheezing, a prolonged cough, cough up blood, or have a change in your usual mucus (if you have chronic lung disease).  You develop sore muscles or a stiff neck. Document Released: 11/16/2000 Document Revised: 08/15/2011 Document Reviewed: 08/28/2013 ExitCare Patient Information 2015 [employer]. This information is not intended to replace advice given to you by your health care provider. Make sure you discuss any questions you have with your health care provider.  - Take meds as prescribed - Use a cool mist humidifier  -Use saline nose sprays frequently -Saline irrigations of the nose can be very helpful if done frequently.  * 4X daily for 1 week*  * Use of a nettie pot can be helpful with this. Follow  directions with this* -Force fluids -For any cough or congestion  Use plain Mucinex- regular strength or max strength is fine   * Children- consult with Pharmacist for dosing -For fever or aces or pains- take tylenol or ibuprofen appropriate for age and weight.  * for fevers greater than 101 orally you may alternate ibuprofen and tylenol every  3 hours. -Throat lozenges if help   Margart Zemanek, FNP   

## 2014-08-25 ENCOUNTER — Other Ambulatory Visit: Payer: Self-pay | Admitting: Family Medicine

## 2014-09-19 ENCOUNTER — Telehealth: Payer: Self-pay | Admitting: Family Medicine

## 2014-09-19 NOTE — Telephone Encounter (Signed)
Crestor coupons at front for patient.

## 2014-10-08 ENCOUNTER — Encounter (INDEPENDENT_AMBULATORY_CARE_PROVIDER_SITE_OTHER): Payer: Medicare Other | Admitting: Family Medicine

## 2014-10-08 DIAGNOSIS — R5382 Chronic fatigue, unspecified: Secondary | ICD-10-CM | POA: Diagnosis not present

## 2014-10-08 DIAGNOSIS — I10 Essential (primary) hypertension: Secondary | ICD-10-CM | POA: Diagnosis not present

## 2014-10-08 DIAGNOSIS — E785 Hyperlipidemia, unspecified: Secondary | ICD-10-CM | POA: Diagnosis not present

## 2014-10-08 DIAGNOSIS — E559 Vitamin D deficiency, unspecified: Secondary | ICD-10-CM

## 2014-10-08 LAB — POCT CBC
GRANULOCYTE PERCENT: 55.7 % (ref 37–80)
HEMATOCRIT: 46.7 % (ref 43.5–53.7)
Hemoglobin: 14.7 g/dL (ref 14.1–18.1)
Lymph, poc: 2.1 (ref 0.6–3.4)
MCH, POC: 30.7 pg (ref 27–31.2)
MCHC: 31.4 g/dL — AB (ref 31.8–35.4)
MCV: 97.5 fL — AB (ref 80–97)
MPV: 9.3 fL (ref 0–99.8)
POC Granulocyte: 3.1 (ref 2–6.9)
POC LYMPH PERCENT: 37.5 %L (ref 10–50)
Platelet Count, POC: 140 10*3/uL — AB (ref 142–424)
RBC: 4.78 M/uL (ref 4.69–6.13)
RDW, POC: 14.8 %
WBC: 5.5 10*3/uL (ref 4.6–10.2)

## 2014-10-09 LAB — BMP8+EGFR
BUN/Creatinine Ratio: 12 (ref 10–22)
BUN: 14 mg/dL (ref 8–27)
CHLORIDE: 104 mmol/L (ref 97–108)
CO2: 22 mmol/L (ref 18–29)
Calcium: 9.2 mg/dL (ref 8.6–10.2)
Creatinine, Ser: 1.17 mg/dL (ref 0.76–1.27)
GFR calc Af Amer: 71 mL/min/{1.73_m2} (ref >59)
GFR, EST NON AFRICAN AMERICAN: 61 mL/min/{1.73_m2} (ref >59)
GLUCOSE: 102 mg/dL — AB (ref 65–99)
POTASSIUM: 4.6 mmol/L (ref 3.5–5.2)
Sodium: 143 mmol/L (ref 134–144)

## 2014-10-09 LAB — HEPATIC FUNCTION PANEL
ALBUMIN: 4.6 g/dL (ref 3.5–4.8)
ALT: 11 IU/L (ref 0–44)
AST: 14 IU/L (ref 0–40)
Alkaline Phosphatase: 64 IU/L (ref 39–117)
Bilirubin Total: 0.6 mg/dL (ref 0.0–1.2)
Bilirubin, Direct: 0.15 mg/dL (ref 0.00–0.40)
TOTAL PROTEIN: 6.7 g/dL (ref 6.0–8.5)

## 2014-10-09 LAB — VITAMIN D 25 HYDROXY (VIT D DEFICIENCY, FRACTURES): VIT D 25 HYDROXY: 44.1 ng/mL (ref 30.0–100.0)

## 2014-10-09 LAB — PSA, TOTAL AND FREE
PROSTATE SPECIFIC AG, SERUM: 2 ng/mL (ref 0.0–4.0)
PSA FREE PCT: 16 %
PSA, Free: 0.32 ng/mL

## 2014-10-09 LAB — NMR, LIPOPROFILE
CHOLESTEROL: 129 mg/dL (ref 100–199)
HDL CHOLESTEROL BY NMR: 44 mg/dL (ref >39)
HDL PARTICLE NUMBER: 28.9 umol/L — AB (ref >=30.5)
LDL Particle Number: 757 nmol/L (ref <1000)
LDL Size: 20.4 nm (ref >20.5)
LDL-C: 70 mg/dL (ref 0–99)
LP-IR Score: 45 (ref <=45)
Small LDL Particle Number: 329 nmol/L (ref <=527)
Triglycerides by NMR: 77 mg/dL (ref 0–149)

## 2014-10-14 ENCOUNTER — Encounter: Payer: Self-pay | Admitting: Family Medicine

## 2014-10-14 ENCOUNTER — Ambulatory Visit (INDEPENDENT_AMBULATORY_CARE_PROVIDER_SITE_OTHER): Payer: Medicare Other | Admitting: Family Medicine

## 2014-10-14 VITALS — BP 134/83 | HR 84 | Temp 97.0°F | Ht 68.0 in | Wt 173.0 lb

## 2014-10-14 DIAGNOSIS — E559 Vitamin D deficiency, unspecified: Secondary | ICD-10-CM | POA: Diagnosis not present

## 2014-10-14 DIAGNOSIS — E785 Hyperlipidemia, unspecified: Secondary | ICD-10-CM | POA: Diagnosis not present

## 2014-10-14 DIAGNOSIS — I10 Essential (primary) hypertension: Secondary | ICD-10-CM

## 2014-10-14 DIAGNOSIS — R972 Elevated prostate specific antigen [PSA]: Secondary | ICD-10-CM

## 2014-10-14 NOTE — Patient Instructions (Addendum)
Medicare Annual Wellness Visit  [facility] and the medical providers at [facility] strive to bring you the best medical care.  In doing so we not only want to address your current medical conditions and concerns but also to detect new conditions early and prevent illness, disease and health-related problems.    Medicare offers a yearly Wellness Visit which allows our clinical staff to assess your need for preventative services including immunizations, lifestyle education, counseling to decrease risk of preventable diseases and screening for fall risk and other medical concerns.    This visit is provided free of charge (no copay) for all Medicare recipients. The clinical pharmacists at [facility] have begun to conduct these Wellness Visits which will also include a thorough review of all your medications.    As you primary medical provider recommend that you make an appointment for your Annual Wellness Visit if you have not done so already this year.  You may set up this appointment before you leave today or you may call back ([phone]) and schedule an appointment.  Please make sure when you call that you mention that you are scheduling your Annual Wellness Visit with the clinical pharmacist so that the appointment may be made for the proper length of time.     Continue current medications. Continue good therapeutic lifestyle changes which include good diet and exercise. Fall precautions discussed with patient. If an FOBT was given today- please return it to our front desk. If you are over 110 years old - you may need Prevnar 24 or the adult Pneumonia vaccine.  Flu Shots are still available at our office. If you still haven't had one please call to set up a nurse visit to get one.   After your visit with [place] today you will receive a survey in the mail or online from [employer] regarding your care with [place]. Please take a moment to  fill this out. Your feedback is very important to [place] as you can help [place] better understand your patient needs as well as improve your experience and satisfaction. WE CARE ABOUT YOU!!!   Continue self physical therapy for your hand Continue to walk and exercise as much as possible and watch her diet as closely as possible Drink plenty of water and fluids the summer

## 2014-10-14 NOTE — Progress Notes (Signed)
Subjective:    Patient ID: Mario Smith, male    DOB: 1940-10-03, 74 y.o.   MRN: [id number]  HPI Pt here for follow up and management of chronic medical problems which includes hypertension and hyperlipidemia. He is taking medication regularly. The patient is doing well overall he is still recovering from his right hand surgery back in December. He denies chest pain shortness of breath or GI symptoms. 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      Patient Active Problem List   Diagnosis Date Noted  . Hyperlipidemia 05/27/2013  . Vitamin D deficiency 05/27/2013  . Hypertension 02/11/2010   Outpatient Encounter Prescriptions as of 10/14/2014  Medication Sig  . aspirin 81 MG EC tablet Take 81 mg by mouth daily.    . Cholecalciferol (VITAMIN D3) 2000 UNITS TABS Take 1 tablet by mouth daily.    . Coenzyme Q10 (CO Q 10 PO) Take 2 capsules by mouth daily.   Marland Kitchen losartan (COZAAR) 100 MG tablet TAKE 1 TABLET BY MOUTH EVERY DAY  . Omega-3 Fatty Acids (FISH OIL) 1000 MG CAPS Take 1 capsule by mouth 2 (two) times daily.    . rosuvastatin (CRESTOR) 20 MG tablet Take 1 tablet (20 mg total) by mouth daily.  . [DISCONTINUED] albuterol (PROVENTIL HFA;VENTOLIN HFA) 108 (90 BASE) MCG/ACT inhaler Inhale 2 puffs into the lungs every 6 (six) hours as needed for wheezing or shortness of breath.  . [DISCONTINUED] azithromycin (ZITHROMAX) 250 MG tablet Take 500 mg once, then 250 mg for four days  . [DISCONTINUED] benzonatate (TESSALON) 200 MG capsule Take 1 capsule (200 mg total) by mouth 3 (three) times daily as needed.   No facility-administered encounter medications on file as of 10/14/2014.     Review of Systems  Constitutional: Negative.   HENT: Negative.   Eyes: Negative.   Respiratory: Negative.   Cardiovascular: Negative.   Gastrointestinal: Negative.   Endocrine: Negative.   Genitourinary: Negative.   Musculoskeletal: Negative.   Skin: Negative.   Allergic/Immunologic:  Negative.   Neurological: Negative.   Hematological: Negative.   Psychiatric/Behavioral: Negative.        Objective:   Physical Exam  Constitutional: He is oriented to person, place, and time. He appears well-developed and well-nourished. No distress.  HENT:  Head: Normocephalic and atraumatic.  Right Ear: External ear normal.  Left Ear: External ear normal.  Nose: Nose normal.  Mouth/Throat: Oropharynx is clear and moist. No oropharyngeal exudate.  Eyes: Conjunctivae and EOM are normal. Pupils are equal, round, and reactive to light. Right eye exhibits no discharge. Left eye exhibits no discharge. No scleral icterus.  Neck: Normal range of motion. Neck supple. No thyromegaly present.  No carotid bruits or anterior cervical adenopathy  Cardiovascular: Normal rate, regular rhythm, normal heart sounds and intact distal pulses.   No murmur heard. At 72/m  Pulmonary/Chest: Effort normal and breath sounds normal. No respiratory distress. He has no wheezes. He has no rales. He exhibits no tenderness.  Clear anteriorly and posteriorly  Abdominal: Soft. Bowel sounds are normal. He exhibits no mass. There is no tenderness. There is no rebound and no guarding.  No abdominal tenderness or masses  Musculoskeletal: Normal range of motion. He exhibits no edema or tenderness.  Lymphadenopathy:    He has no cervical adenopathy.  Neurological: He is alert and oriented to person, place, and time. He has normal reflexes. No cranial nerve deficit.  Skin: Skin is warm and dry. No rash noted.  No erythema. No pallor.  Psychiatric: He has a normal mood and affect. His behavior is normal. Judgment and thought content normal.  Nursing note and vitals reviewed.  BP 134/83 mmHg  Pulse 84  Temp(Src) 97 F (36.1 C) (Oral)  Ht 5\' 8"  (1.727 m)  Wt 173 lb (78.472 kg)  BMI 26.31 kg/m2        Assessment & Plan:  1. Hyperlipidemia -Cholesterol numbers were excellent and patient should continue with  current treatment  2. Hypertension -Blood pressure was under good control with normal renal function and patient should continue with current treatment  3. Vitamin D deficiency -The vitamin D level was good and the patient should continue with current treatment  4. Elevated PSA -The PSA is no longer elevated and back within normal limits  Patient Instructions                       Medicare Annual Wellness Visit  [facility] and the medical providers at [facility] strive to bring you the best medical care.  In doing so we not only want to address your current medical conditions and concerns but also to detect new conditions early and prevent illness, disease and health-related problems.    Medicare offers a yearly Wellness Visit which allows our clinical staff to assess your need for preventative services including immunizations, lifestyle education, counseling to decrease risk of preventable diseases and screening for fall risk and other medical concerns.    This visit is provided free of charge (no copay) for all Medicare recipients. The clinical pharmacists at [facility] have begun to conduct these Wellness Visits which will also include a thorough review of all your medications.    As you primary medical provider recommend that you make an appointment for your Annual Wellness Visit if you have not done so already this year.  You may set up this appointment before you leave today or you may call back [phone]) and schedule an appointment.  Please make sure when you call that you mention that you are scheduling your Annual Wellness Visit with the clinical pharmacist so that the appointment may be made for the proper length of time.     Continue current medications. Continue good therapeutic lifestyle changes which include good diet and exercise. Fall precautions discussed with patient. If an FOBT was given today- please return it to our  front desk. If you are over 81 years old - you may need Prevnar 44 or the adult Pneumonia vaccine.  Flu Shots are still available at our office. If you still haven't had one please call to set up a nurse visit to get one.   After your visit with [place] today you will receive a survey in the mail or online from [employer] regarding your care with [place]. Please take a moment to fill this out. Your feedback is very important to [place] as you can help [place] better understand your patient needs as well as improve your experience and satisfaction. WE CARE ABOUT YOU!!!   Continue self physical therapy for your hand Continue to walk and exercise as much as possible and watch her diet as closely as possible Drink plenty of water and fluids the summer   Arrie Senate MD

## 2014-10-16 ENCOUNTER — Other Ambulatory Visit: Payer: Medicare Other | Admitting: *Deleted

## 2014-10-16 DIAGNOSIS — Z1212 Encounter for screening for malignant neoplasm of rectum: Secondary | ICD-10-CM

## 2014-10-16 NOTE — Progress Notes (Signed)
Lab only 

## 2014-10-17 ENCOUNTER — Other Ambulatory Visit: Payer: Self-pay | Admitting: Family Medicine

## 2014-10-18 LAB — FECAL OCCULT BLOOD, IMMUNOCHEMICAL: Fecal Occult Bld: NEGATIVE

## 2014-11-07 NOTE — Progress Notes (Signed)
   Subjective:    Patient ID: Mario Smith, male    DOB: 08-Mar-1941, 74 y.o.   MRN: [id number]  HPI    Review of Systems     Objective:   Physical Exam        Assessment & Plan:

## 2014-12-10 ENCOUNTER — Encounter: Payer: Self-pay | Admitting: Family Medicine

## 2014-12-10 ENCOUNTER — Ambulatory Visit (INDEPENDENT_AMBULATORY_CARE_PROVIDER_SITE_OTHER): Payer: Medicare Other | Admitting: Family Medicine

## 2014-12-10 VITALS — BP 145/87 | HR 91 | Temp 97.2°F | Ht 68.0 in | Wt 169.0 lb

## 2014-12-10 DIAGNOSIS — R3989 Other symptoms and signs involving the genitourinary system: Secondary | ICD-10-CM

## 2014-12-10 LAB — POCT URINALYSIS DIPSTICK
BILIRUBIN UA: NEGATIVE
Glucose, UA: NEGATIVE
KETONES UA: NEGATIVE
Nitrite, UA: NEGATIVE
PH UA: 6
Spec Grav, UA: 1.025
Urobilinogen, UA: NEGATIVE

## 2014-12-10 LAB — POCT WET PREP (WET MOUNT)

## 2014-12-10 LAB — POCT UA - MICROSCOPIC ONLY
Casts, Ur, LPF, POC: NEGATIVE
Crystals, Ur, HPF, POC: NEGATIVE
Yeast, UA: NEGATIVE

## 2014-12-10 MED ORDER — DOXYCYCLINE HYCLATE 100 MG PO TABS: 100.0000 mg | ORAL_TABLET | Freq: Two times a day (BID) | ORAL | Status: DC

## 2014-12-10 NOTE — Patient Instructions (Signed)
Drink plenty of fluids Take antibiotic twice daily with food Recheck a urinalysis and 10-14 days and return to clinic for a revisit in 4 weeks If you get worse come back sooner

## 2014-12-10 NOTE — Addendum Note (Signed)
Addended by: Pollyann Kennedy F on: 12/10/2014 12:27 PM   Modules accepted: Orders

## 2014-12-10 NOTE — Progress Notes (Signed)
Subjective:    Patient ID: Mario Smith, male    DOB: [birth year], 74 y.o.   MRN: [id number]  HPI Patient here today for several reasons. He has recently noticed a knot behind his right ear, he is having dysuria, and fever. The patient has a recent history of about with prostatitis. The patient went fishing 5 days ago and developed this knot behind his right ear and what he thought might of been a bite on his right for head. 3 days ago he felt feverish and started having some problems with passing his water. The knot behind the ear and on the for head seem to be resolving. He denies any chest pain shortness of breath coughing GI symptoms or blood in the stool.     Patient Active Problem List   Diagnosis Date Noted  . Hyperlipidemia 05/27/2013  . Vitamin D deficiency 05/27/2013  . Hypertension 02/11/2010   Outpatient Encounter Prescriptions as of 12/10/2014  Medication Sig  . aspirin 81 MG EC tablet Take 81 mg by mouth daily.    . Cholecalciferol (VITAMIN D3) 2000 UNITS TABS Take 1 tablet by mouth daily.    . Coenzyme Q10 (CO Q 10 PO) Take 2 capsules by mouth daily.   Marland Kitchen losartan (COZAAR) 100 MG tablet TAKE 1 TABLET BY MOUTH EVERY DAY  . Omega-3 Fatty Acids (FISH OIL) 1000 MG CAPS Take 1 capsule by mouth 2 (two) times daily.    . rosuvastatin (CRESTOR) 20 MG tablet Take 1 tablet (20 mg total) by mouth daily.  . [DISCONTINUED] CRESTOR 20 MG tablet TAKE 1 TABLET (20 MG TOTAL) BY MOUTH DAILY.   No facility-administered encounter medications on file as of 12/10/2014.      Review of Systems  Constitutional: Positive for fever and chills.  HENT: Negative.   Eyes: Negative.   Respiratory: Negative.   Cardiovascular: Negative.   Gastrointestinal: Negative.   Endocrine: Negative.   Genitourinary: Positive for dysuria.  Musculoskeletal: Positive for myalgias and neck pain (knot behind right ear).  Skin: Negative.   Allergic/Immunologic: Negative.   Neurological: Negative.   Hematological:  Negative.   Psychiatric/Behavioral: Negative.        Objective:   Physical Exam  Constitutional: He is oriented to person, place, and time. He appears well-developed and well-nourished. No distress.  HENT:  Head: Normocephalic and atraumatic.  Eyes: Conjunctivae and EOM are normal. Pupils are equal, round, and reactive to light. Right eye exhibits no discharge. Left eye exhibits no discharge. No scleral icterus.  Neck: Normal range of motion. Neck supple. No thyromegaly present.  Cardiovascular: Normal rate, regular rhythm and normal heart sounds.   No murmur heard. Pulmonary/Chest: Effort normal and breath sounds normal. He has no wheezes. He has no rales.  Abdominal: Soft. Bowel sounds are normal. He exhibits no mass. There is no tenderness. There is no rebound and no guarding.  Genitourinary: Rectum normal and prostate normal.  The prostate is enlarged and swollen left greater than right and tender to palpation.  Musculoskeletal: Normal range of motion. He exhibits no edema.  Lymphadenopathy:    He has no cervical adenopathy.  Neurological: He is alert and oriented to person, place, and time.  Skin: Skin is warm and dry. No rash noted.  The lump behind the right ear and the lump on the for head appeared to have resolved and there is no residual problem with this.  Psychiatric: He has a normal mood and affect. His behavior is normal. Judgment and  thought content normal.  Nursing note and vitals reviewed.  BP 145/87 mmHg  Pulse 91  Temp(Src) 97.2 F (36.2 C) (Oral)  Ht 5\' 8"  (1.727 m)  Wt 169 lb (76.658 kg)  BMI 25.70 kg/m2  The wet prep following the prostate exam was loaded with WBCs We will send out a urine after the prostate exam for culture and sensitivity  Results for orders placed or performed in visit on 12/10/14  POCT Wet Prep [facility])  Result Value Ref Range   Source Wet Prep POC     WBC, Wet Prep HPF POC tntc    Bacteria Wet Prep HPF POC None (A) Few   Clue  Cells Wet Prep HPF POC None None   Yeast Wet Prep HPF POC None    KOH Wet Prep POC     Trichomonas Wet Prep HPF POC none          Assessment & Plan:  1. Abnormal prostate exam -Wet prep was loaded with WBC and the patient appears to have another prostate infection. - POCT Wet Prep [facility]) - Urine culture  Patient Instructions  Drink plenty of fluids Take antibiotic twice daily with food Recheck a urinalysis and 10-14 days and return to clinic for a revisit in 4 weeks If you get worse come back sooner   Arrie Senate MD

## 2014-12-12 LAB — URINE CULTURE

## 2014-12-24 ENCOUNTER — Other Ambulatory Visit (INDEPENDENT_AMBULATORY_CARE_PROVIDER_SITE_OTHER): Payer: Medicare Other

## 2014-12-24 DIAGNOSIS — R8290 Unspecified abnormal findings in urine: Secondary | ICD-10-CM

## 2014-12-24 LAB — POCT UA - MICROSCOPIC ONLY
BACTERIA, U MICROSCOPIC: NEGATIVE
Casts, Ur, LPF, POC: NEGATIVE
Crystals, Ur, HPF, POC: NEGATIVE
Mucus, UA: NEGATIVE
RBC, URINE, MICROSCOPIC: NEGATIVE
YEAST UA: NEGATIVE

## 2014-12-24 LAB — POCT URINALYSIS DIPSTICK
Bilirubin, UA: NEGATIVE
Glucose, UA: NEGATIVE
Ketones, UA: NEGATIVE
LEUKOCYTES UA: NEGATIVE
NITRITE UA: NEGATIVE
PH UA: 7.5
Protein, UA: NEGATIVE
Spec Grav, UA: 1.015
Urobilinogen, UA: NEGATIVE

## 2014-12-24 NOTE — Progress Notes (Signed)
Lab only 

## 2014-12-24 NOTE — Progress Notes (Signed)
[email or web address]

## 2014-12-25 NOTE — Progress Notes (Signed)
Patient aware.

## 2014-12-29 ENCOUNTER — Encounter: Payer: Self-pay | Admitting: Family Medicine

## 2014-12-29 ENCOUNTER — Encounter: Payer: Medicare Other | Admitting: Family Medicine

## 2014-12-29 DIAGNOSIS — N419 Inflammatory disease of prostate, unspecified: Secondary | ICD-10-CM

## 2014-12-29 LAB — POCT UA - MICROSCOPIC ONLY
Bacteria, U Microscopic: NEGATIVE
CRYSTALS, UR, HPF, POC: NEGATIVE
Casts, Ur, LPF, POC: NEGATIVE
EPITHELIAL CELLS, URINE PER MICROSCOPY: NEGATIVE
RBC, urine, microscopic: NEGATIVE
Yeast, UA: NEGATIVE

## 2014-12-29 LAB — POCT URINALYSIS DIPSTICK
Bilirubin, UA: NEGATIVE
Blood, UA: NEGATIVE
GLUCOSE UA: NEGATIVE
KETONES UA: NEGATIVE
Leukocytes, UA: NEGATIVE
NITRITE UA: NEGATIVE
PH UA: 5
Protein, UA: NEGATIVE
Spec Grav, UA: 1.02
UROBILINOGEN UA: NEGATIVE

## 2014-12-29 NOTE — Progress Notes (Signed)
This encounter was created in error - please disregard.

## 2014-12-29 NOTE — Progress Notes (Deleted)
   Subjective:    Patient ID: Mario Smith, male    DOB: [birth year], 74 y.o.   MRN: [id number]  HPI  Patient here today for medication reactions.  Patient complains of nausea, vomiting, and fatigue. The patient recently had a urine specimen that had only 2-3 WBC and he was asked to continue the antibiotic for the full 4 week course. He did finish the last pill today.    Patient Active Problem List   Diagnosis Date Noted  . Hyperlipidemia 05/27/2013  . Vitamin D deficiency 05/27/2013  . Hypertension 02/11/2010   Outpatient Encounter Prescriptions as of 12/29/2014  Medication Sig  . aspirin 81 MG EC tablet Take 81 mg by mouth daily.    . Cholecalciferol (VITAMIN D3) 2000 UNITS TABS Take 1 tablet by mouth daily.    . Coenzyme Q10 (CO Q 10 PO) Take 2 capsules by mouth daily.   Marland Kitchen losartan (COZAAR) 100 MG tablet TAKE 1 TABLET BY MOUTH EVERY DAY  . Omega-3 Fatty Acids (FISH OIL) 1000 MG CAPS Take 1 capsule by mouth 2 (two) times daily.    . rosuvastatin (CRESTOR) 20 MG tablet Take 1 tablet (20 mg total) by mouth daily.  . [DISCONTINUED] doxycycline (VIBRA-TABS) 100 MG tablet Take 1 tablet (100 mg total) by mouth 2 (two) times daily. (Patient not taking: Reported on 12/29/2014)   No facility-administered encounter medications on file as of 12/29/2014.        Review of Systems  Constitutional: Positive for fatigue.  HENT: Negative.   Eyes: Negative.   Respiratory: Negative.   Cardiovascular: Negative.   Gastrointestinal: Positive for nausea and vomiting.  Endocrine: Negative.   Genitourinary: Negative.   Musculoskeletal: Negative.   Skin: Negative.   Allergic/Immunologic: Negative.   Neurological: Negative.   Hematological: Negative.   Psychiatric/Behavioral: Negative.        Objective:   Physical Exam  BP 119/61 mmHg  Pulse 82  Temp(Src) 98.1 F (36.7 C) (Oral)  Ht 5\' 8"  (1.727 m)  Wt 170 lb (77.111 kg)  BMI 25.85 kg/m2         Assessment & Plan:

## 2014-12-31 LAB — URINE CULTURE: Organism ID, Bacteria: NO GROWTH

## 2015-01-09 ENCOUNTER — Ambulatory Visit: Payer: Medicare Other | Admitting: Family Medicine

## 2015-02-16 ENCOUNTER — Other Ambulatory Visit: Payer: Self-pay | Admitting: Family Medicine

## 2015-02-19 ENCOUNTER — Other Ambulatory Visit (INDEPENDENT_AMBULATORY_CARE_PROVIDER_SITE_OTHER): Payer: Medicare Other

## 2015-02-19 DIAGNOSIS — E785 Hyperlipidemia, unspecified: Secondary | ICD-10-CM

## 2015-02-19 DIAGNOSIS — I10 Essential (primary) hypertension: Secondary | ICD-10-CM | POA: Diagnosis not present

## 2015-02-19 DIAGNOSIS — R972 Elevated prostate specific antigen [PSA]: Secondary | ICD-10-CM

## 2015-02-19 DIAGNOSIS — E559 Vitamin D deficiency, unspecified: Secondary | ICD-10-CM

## 2015-02-19 NOTE — Progress Notes (Signed)
Lab only 

## 2015-02-20 LAB — CBC WITH DIFFERENTIAL/PLATELET
Basophils Absolute: 0 10*3/uL (ref 0.0–0.2)
Basos: 0 %
EOS (ABSOLUTE): 0.1 10*3/uL (ref 0.0–0.4)
EOS: 3 %
HEMOGLOBIN: 14.6 g/dL (ref 12.6–17.7)
Hematocrit: 42.8 % (ref 37.5–51.0)
Immature Grans (Abs): 0 10*3/uL (ref 0.0–0.1)
Immature Granulocytes: 0 %
Lymphocytes Absolute: 1.6 10*3/uL (ref 0.7–3.1)
Lymphs: 29 %
MCH: 33.5 pg — AB (ref 26.6–33.0)
MCHC: 34.1 g/dL (ref 31.5–35.7)
MCV: 98 fL — ABNORMAL HIGH (ref 79–97)
Monocytes Absolute: 0.6 10*3/uL (ref 0.1–0.9)
Monocytes: 11 %
NEUTROS ABS: 3.1 10*3/uL (ref 1.4–7.0)
Neutrophils: 57 %
Platelets: 163 10*3/uL (ref 150–379)
RBC: 4.36 x10E6/uL (ref 4.14–5.80)
RDW: 15.6 % — AB (ref 12.3–15.4)
WBC: 5.5 10*3/uL (ref 3.4–10.8)

## 2015-02-20 LAB — BMP8+EGFR
BUN / CREAT RATIO: 11 (ref 10–22)
BUN: 12 mg/dL (ref 8–27)
CO2: 21 mmol/L (ref 18–29)
CREATININE: 1.07 mg/dL (ref 0.76–1.27)
Calcium: 9.3 mg/dL (ref 8.6–10.2)
Chloride: 105 mmol/L (ref 97–108)
GFR calc non Af Amer: 68 mL/min/{1.73_m2} (ref >59)
GFR, EST AFRICAN AMERICAN: 78 mL/min/{1.73_m2} (ref >59)
Glucose: 99 mg/dL (ref 65–99)
Potassium: 4.3 mmol/L (ref 3.5–5.2)
Sodium: 142 mmol/L (ref 134–144)

## 2015-02-20 LAB — HEPATIC FUNCTION PANEL
ALK PHOS: 68 IU/L (ref 39–117)
ALT: 9 IU/L (ref 0–44)
AST: 14 IU/L (ref 0–40)
Albumin: 4 g/dL (ref 3.5–4.8)
Bilirubin Total: 0.4 mg/dL (ref 0.0–1.2)
Bilirubin, Direct: 0.15 mg/dL (ref 0.00–0.40)
Total Protein: 6.5 g/dL (ref 6.0–8.5)

## 2015-02-20 LAB — LIPID PANEL
CHOL/HDL RATIO: 3.1 ratio (ref 0.0–5.0)
Cholesterol, Total: 102 mg/dL (ref 100–199)
HDL: 33 mg/dL — ABNORMAL LOW (ref >39)
LDL CALC: 51 mg/dL (ref 0–99)
TRIGLYCERIDES: 89 mg/dL (ref 0–149)
VLDL Cholesterol Cal: 18 mg/dL (ref 5–40)

## 2015-02-20 LAB — VITAMIN D 25 HYDROXY (VIT D DEFICIENCY, FRACTURES): Vit D, 25-Hydroxy: 49.2 ng/mL (ref 30.0–100.0)

## 2015-02-20 LAB — PSA, TOTAL AND FREE
PSA, Free Pct: 14 %
PSA, Free: 0.28 ng/mL
Prostate Specific Ag, Serum: 2 ng/mL (ref 0.0–4.0)

## 2015-02-23 ENCOUNTER — Encounter: Payer: Self-pay | Admitting: Family Medicine

## 2015-02-23 ENCOUNTER — Encounter (INDEPENDENT_AMBULATORY_CARE_PROVIDER_SITE_OTHER): Payer: Self-pay

## 2015-02-23 ENCOUNTER — Ambulatory Visit (INDEPENDENT_AMBULATORY_CARE_PROVIDER_SITE_OTHER): Payer: Medicare Other | Admitting: Family Medicine

## 2015-02-23 VITALS — BP 136/72 | HR 79 | Temp 98.1°F | Ht 68.0 in | Wt 169.0 lb

## 2015-02-23 DIAGNOSIS — J301 Allergic rhinitis due to pollen: Secondary | ICD-10-CM

## 2015-02-23 DIAGNOSIS — I10 Essential (primary) hypertension: Secondary | ICD-10-CM | POA: Diagnosis not present

## 2015-02-23 DIAGNOSIS — R972 Elevated prostate specific antigen [PSA]: Secondary | ICD-10-CM

## 2015-02-23 DIAGNOSIS — E559 Vitamin D deficiency, unspecified: Secondary | ICD-10-CM

## 2015-02-23 DIAGNOSIS — E785 Hyperlipidemia, unspecified: Secondary | ICD-10-CM | POA: Diagnosis not present

## 2015-02-23 MED ORDER — FLUTICASONE PROPIONATE 50 MCG/ACT NA SUSP: 2.0000 | Freq: Every day | NASAL | Status: DC

## 2015-02-23 NOTE — Patient Instructions (Addendum)
Medicare Annual Wellness Visit  Mario [facility] and the medical providers at [facility] strive to bring you the best medical care.  In doing so we not only want to address your current medical conditions and concerns but also to detect new conditions early and prevent illness, disease and health-related problems.    Medicare offers a yearly Wellness Visit which allows our clinical staff to assess your need for preventative services including immunizations, lifestyle education, counseling to decrease risk of preventable diseases and screening for fall risk and other medical concerns.    This visit is provided free of charge (no copay) for all Medicare recipients. The clinical pharmacists at [facility] have begun to conduct these Wellness Visits which will also include a thorough review of all your medications.    As you primary medical provider recommend that you make an appointment for your Annual Wellness Visit if you have not done so already this year.  You may set up this appointment before you leave today or you may call back ([phone]) and schedule an appointment.  Please make sure when you call that you mention that you are scheduling your Annual Wellness Visit with the clinical pharmacist so that the appointment may be made for the proper length of time.     Continue current medications. Continue good therapeutic lifestyle changes which include good diet and exercise. Fall precautions discussed with patient. If an FOBT was given today- please return it to our front desk. If you are over 1 years old - you may need Prevnar 22 or the adult Pneumonia vaccine.  **Flu shots will be available soon--- please call and schedule a FLU-CLINIC appointment**  After your visit with [place] today you will receive a survey in the mail or online from [employer] regarding your care with [place]. Please take a moment to fill this out. Your feedback is  very important to [place] as you can help [place] better understand your patient needs as well as improve your experience and satisfaction. WE CARE ABOUT YOU!!!   **Please join [place] SEPT.22, 2016 from 5:00 to 7:00pm for our OPEN HOUSE! Come out and meet our NEW providers** The patient should continue to drink plenty of fluids He should use nasal saline frequently through the day to help the sinus congestion and use the Flonase at nighttime. He should use Mucinex as needed for cough and congestion We'll check the PSA again in one year.

## 2015-02-23 NOTE — Progress Notes (Signed)
Subjective:    Patient ID: Mario Smith, male    DOB: [birth year], 74 y.o.   MRN: [id number]  HPI Pt here for follow up and management of chronic medical problems which includes hyperlipidemia and hypertension. He is taking medications regularly. The patient today comes in complaining of some ears and chest congestion. He also brings in blood pressures for review and all of these are excellent in the 120s over the 70s on the average. He has already had his lab work done and this will be reviewed with him during the visit today.The CBC has a normal white blood cell count. The hemoglobin is good at 14.6 and the platelet count is adequate. The blood sugar is good at 99. The creatinine, the most important kidney function test is within normal limits. The electrolytes including potassium are good. All liver function tests are within normal limits The PSA is stable at 2.0 and consistent with readings over the past year. The vitamin D level is good at 49.2 and the patient should continue with current treatment A traditional lipid panel was done and the cholesterol triglycerides and LDL C were all excellent. Good cholesterol however was low and this can be improved by more exercise and continuing to watch the diet.----The patient should continue with his Crestor and omega-3 fatty acids and aggressive therapeutic lifestyle changes. The patient recently had an intestinal virus and he is feeling better from this. He is also still recovering somewhat from having taken the antibiotic that he reacted to the past. Today he denies chest pain shortness of breath trouble swallowing and heartburn indigestion nausea vomiting diarrhea or blood in the stool. He's passing his water without problems. He says he feels like his ears are stopped up and he has some congestion in his throat at times. He is not running a fever and the color of this is clear.       Patient Active Problem List   Diagnosis Date Noted  .  Hyperlipidemia 05/27/2013  . Vitamin D deficiency 05/27/2013  . Hypertension 02/11/2010   Outpatient Encounter Prescriptions as of 02/23/2015  Medication Sig  . aspirin 81 MG EC tablet Take 81 mg by mouth daily.    . Cholecalciferol (VITAMIN D3) 2000 UNITS TABS Take 1 tablet by mouth daily.    . Coenzyme Q10 (CO Q 10 PO) Take 2 capsules by mouth daily.   Marland Kitchen losartan (COZAAR) 100 MG tablet TAKE 1 TABLET BY MOUTH EVERY DAY  . Omega-3 Fatty Acids (FISH OIL) 1000 MG CAPS Take 1 capsule by mouth 2 (two) times daily.    . rosuvastatin (CRESTOR) 20 MG tablet Take 1 tablet (20 mg total) by mouth daily.  . [DISCONTINUED] CRESTOR 20 MG tablet TAKE 1 TABLET (20 MG TOTAL) BY MOUTH DAILY.   No facility-administered encounter medications on file as of 02/23/2015.     Review of Systems  Constitutional: Negative.   HENT: Sinus pressure: ear and chest congestion.   Eyes: Negative.   Respiratory: Negative.   Cardiovascular: Negative.   Gastrointestinal: Negative.   Endocrine: Negative.   Genitourinary: Negative.   Musculoskeletal: Negative.   Skin: Negative.   Allergic/Immunologic: Negative.   Neurological: Negative.   Hematological: Negative.   Psychiatric/Behavioral: Negative.        Objective:   Physical Exam  Constitutional: He is oriented to person, place, and time. He appears well-developed and well-nourished. No distress.  HENT:  Head: Normocephalic and atraumatic.  Right Ear: External ear normal.  Left Ear:  External ear normal.  Mouth/Throat: Oropharynx is clear and moist. No oropharyngeal exudate.  Nasal congestion and pallor  Eyes: Conjunctivae and EOM are normal. Pupils are equal, round, and reactive to light. Right eye exhibits no discharge. Left eye exhibits no discharge. No scleral icterus.  Neck: Normal range of motion. Neck supple. No thyromegaly present.  Without bruits or thyromegaly  Cardiovascular: Normal rate, regular rhythm, normal heart sounds and intact distal  pulses.   No murmur heard. The heart is regular at 72/m  Pulmonary/Chest: Effort normal and breath sounds normal. No respiratory distress. He has no wheezes. He has no rales. He exhibits no tenderness.  Clear anteriorly and posteriorly  Abdominal: Soft. Bowel sounds are normal. He exhibits no mass. There is no tenderness. There is no rebound and no guarding.  No abdominal tenderness bruits or organ enlargement  Genitourinary: Prostate normal.  Musculoskeletal: Normal range of motion. He exhibits no edema or tenderness.  Lymphadenopathy:    He has no cervical adenopathy.  Neurological: He is alert and oriented to person, place, and time.  Skin: Skin is warm and dry. No rash noted.  Psychiatric: He has a normal mood and affect. His behavior is normal. Judgment and thought content normal.  Nursing note and vitals reviewed.   BP 136/72 mmHg  Pulse 79  Temp(Src) 98.1 F (36.7 C) (Oral)  Ht 5\' 8"  (1.727 m)  Wt 169 lb (76.658 kg)  BMI 25.70 kg/m2       Assessment & Plan:  1. Hyperlipidemia -Cholesterol numbers were excellent and the patient will continue with current treatment  2. Hypertension -The blood pressure is good and his renal function is great and he will continue with current treatment  3. Vitamin D deficiency -He should continue with current treatment  4. Elevated PSA -The PSA is no longer elevated and hopefully this was only due to an infection he had at the time.  5. Allergic rhinitis due to pollen -Use the Flonase regularly and nasal saline through the day - fluticasone (FLONASE) 50 MCG/ACT nasal spray; Place 2 sprays into both nostrils daily.  Dispense: 16 g; Refill: 6  Meds ordered this encounter  Medications  . fluticasone (FLONASE) 50 MCG/ACT nasal spray    Sig: Place 2 sprays into both nostrils daily.    Dispense:  16 g    Refill:  6   Patient Instructions                       Medicare Annual Wellness Visit  [facility] and the medical providers at  [facility] strive to bring you the best medical care.  In doing so we not only want to address your current medical conditions and concerns but also to detect new conditions early and prevent illness, disease and health-related problems.    Medicare offers a yearly Wellness Visit which allows our clinical staff to assess your need for preventative services including immunizations, lifestyle education, counseling to decrease risk of preventable diseases and screening for fall risk and other medical concerns.    This visit is provided free of charge (no copay) for all Medicare recipients. The clinical pharmacists at [facility] have begun to conduct these Wellness Visits which will also include a thorough review of all your medications.    As you primary medical Shazia Mitchener recommend that you make an appointment for your Annual Wellness Visit if you have not done so already this year.  You may set  up this appointment before you leave today or you may call back [phone]) and schedule an appointment.  Please make sure when you call that you mention that you are scheduling your Annual Wellness Visit with the clinical pharmacist so that the appointment may be made for the proper length of time.     Continue current medications. Continue good therapeutic lifestyle changes which include good diet and exercise. Fall precautions discussed with patient. If an FOBT was given today- please return it to our front desk. If you are over 6 years old - you may need Prevnar 41 or the adult Pneumonia vaccine.  **Flu shots will be available soon--- please call and schedule a FLU-CLINIC appointment**  After your visit with [place] today you will receive a survey in the mail or online from [employer] regarding your care with [place]. Please take a moment to fill this out. Your feedback is very important to [place] as you can help [place] better understand your patient needs as well as improve  your experience and satisfaction. WE CARE ABOUT YOU!!!   **Please join [place] SEPT.22, 2016 from 5:00 to 7:00pm for our OPEN HOUSE! Come out and meet our NEW providers** The patient should continue to drink plenty of fluids He should use nasal saline frequently through the day to help the sinus congestion and use the Flonase at nighttime. He should use Mucinex as needed for cough and congestion We'll check the PSA again in one year.   Arrie Senate MD

## 2015-02-25 ENCOUNTER — Encounter: Payer: Self-pay | Admitting: Gastroenterology

## 2015-03-10 ENCOUNTER — Other Ambulatory Visit: Payer: Self-pay | Admitting: Family Medicine

## 2015-04-01 ENCOUNTER — Telehealth: Payer: Self-pay | Admitting: Family Medicine

## 2015-04-01 MED ORDER — ROSUVASTATIN CALCIUM 20 MG PO TABS: 20.0000 mg | ORAL_TABLET | Freq: Every day | ORAL | Status: DC

## 2015-04-01 NOTE — Telephone Encounter (Signed)
done

## 2015-04-02 ENCOUNTER — Ambulatory Visit (INDEPENDENT_AMBULATORY_CARE_PROVIDER_SITE_OTHER): Payer: Medicare Other

## 2015-04-02 DIAGNOSIS — Z23 Encounter for immunization: Secondary | ICD-10-CM | POA: Diagnosis not present

## 2015-04-10 ENCOUNTER — Encounter: Payer: Self-pay | Admitting: Family Medicine

## 2015-04-10 ENCOUNTER — Ambulatory Visit (INDEPENDENT_AMBULATORY_CARE_PROVIDER_SITE_OTHER): Payer: Medicare Other | Admitting: Family Medicine

## 2015-04-10 VITALS — BP 163/68 | HR 69 | Temp 97.3°F | Ht 68.0 in | Wt 166.0 lb

## 2015-04-10 DIAGNOSIS — W57XXXA Bitten or stung by nonvenomous insect and other nonvenomous arthropods, initial encounter: Secondary | ICD-10-CM

## 2015-04-10 DIAGNOSIS — M791 Myalgia, unspecified site: Secondary | ICD-10-CM

## 2015-04-10 DIAGNOSIS — S3991XA Unspecified injury of abdomen, initial encounter: Secondary | ICD-10-CM

## 2015-04-10 DIAGNOSIS — G629 Polyneuropathy, unspecified: Secondary | ICD-10-CM

## 2015-04-10 NOTE — Patient Instructions (Signed)
Continue to take ibuprofen until lab work is returned Drink plenty of fluids try alternating ibuprofen with Tylenol if possible

## 2015-04-10 NOTE — Progress Notes (Signed)
Subjective:    Patient ID: Mario Smith, male    DOB: 08/19/1940, 74 y.o.   MRN: [id number]  HPI Patient here today for aches and pains and possible tick bites. He is having some general joint pains. He is also having some burning in his hands and feet. The last tick that he removed was 1 month ago. He's had symptoms for 3 days. The patient does not give a history of a target lesion or any kind of rash. The tick that was removed was from his abdomen and this was about 4 weeks ago. The burning symptoms that he has had in his hands and feet has been going on for about 3 days. The allergy that he had to doxycycline was nausea and vomiting.     Patient Active Problem List   Diagnosis Date Noted  . Hyperlipidemia 05/27/2013  . Vitamin D deficiency 05/27/2013  . Hypertension 02/11/2010   Outpatient Encounter Prescriptions as of 04/10/2015  Medication Sig  . aspirin 81 MG EC tablet Take 81 mg by mouth daily.    . Cholecalciferol (VITAMIN D3) 2000 UNITS TABS Take 1 tablet by mouth daily.    . Coenzyme Q10 (CO Q 10 PO) Take 2 capsules by mouth daily.   . fluticasone (FLONASE) 50 MCG/ACT nasal spray Place 2 sprays into both nostrils daily.  Marland Kitchen losartan (COZAAR) 100 MG tablet TAKE 1 TABLET BY MOUTH EVERY DAY  . Omega-3 Fatty Acids (FISH OIL) 1000 MG CAPS Take 1 capsule by mouth 2 (two) times daily.    . rosuvastatin (CRESTOR) 20 MG tablet Take 1 tablet (20 mg total) by mouth daily.   No facility-administered encounter medications on file as of 04/10/2015.      Review of Systems  Constitutional: Negative.   HENT: Negative.   Eyes: Negative.   Respiratory: Negative.   Cardiovascular: Negative.   Gastrointestinal: Negative.   Endocrine: Negative.   Genitourinary: Negative.   Musculoskeletal: Positive for myalgias and arthralgias.  Skin: Negative.   Allergic/Immunologic: Negative.   Neurological: Negative.   Hematological: Negative.   Psychiatric/Behavioral: Negative.          Objective:   Physical Exam  Constitutional: He is oriented to person, place, and time. He appears well-developed and well-nourished. No distress.  HENT:  Head: Normocephalic and atraumatic.  Eyes: Conjunctivae and EOM are normal. Pupils are equal, round, and reactive to light. Right eye exhibits no discharge. Left eye exhibits no discharge. No scleral icterus.  Neck: Normal range of motion. Neck supple. No thyromegaly present.  Cardiovascular: Normal rate, regular rhythm and normal heart sounds.   No murmur heard. Pulmonary/Chest: Effort normal and breath sounds normal. No respiratory distress. He has no wheezes. He has no rales. He exhibits no tenderness.  Clear anteriorly and posteriorly  Abdominal: Soft. Bowel sounds are normal. There is no tenderness. There is no rebound.  Musculoskeletal: Normal range of motion. He exhibits no edema or tenderness.  No joint swelling or tenderness no muscle tenderness  Lymphadenopathy:    He has no cervical adenopathy.  Neurological: He is alert and oriented to person, place, and time. He has normal reflexes.  Skin: Skin is warm and dry. No rash noted. No erythema.  Psychiatric: He has a normal mood and affect. His behavior is normal. Judgment and thought content normal.  Nursing note and vitals reviewed.  BP 163/68 mmHg  Pulse 69  Temp(Src) 97.3 F (36.3 C) (Oral)  Ht 5\' 8"  (1.727 m)  Wt 166 lb (  75.297 kg)  BMI 25.25 kg/m2        Assessment & Plan:  1. 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 Myalgia -Take ibuprofen as doing an alternate with Tylenol  Patient Instructions  Continue to take ibuprofen until lab work is returned Drink plenty of fluids try alternating ibuprofen  with Tylenol if possible   Arrie Senate MD

## 2015-04-13 ENCOUNTER — Telehealth: Payer: Self-pay | Admitting: Family Medicine

## 2015-04-13 NOTE — Telephone Encounter (Signed)
Patient aware that results are not back in. Patient states that he is tolerating the doxycycline.

## 2015-04-14 LAB — CBC WITH DIFFERENTIAL/PLATELET
Basophils Absolute: 0 10*3/uL (ref 0.0–0.2)
Basos: 0 %
EOS (ABSOLUTE): 0.1 10*3/uL (ref 0.0–0.4)
Eos: 2 %
Hematocrit: 43.3 % (ref 37.5–51.0)
Hemoglobin: 14.1 g/dL (ref 12.6–17.7)
Immature Grans (Abs): 0 10*3/uL (ref 0.0–0.1)
Immature Granulocytes: 0 %
Lymphocytes Absolute: 2 10*3/uL (ref 0.7–3.1)
Lymphs: 44 %
MCH: 32.6 pg (ref 26.6–33.0)
MCHC: 32.6 g/dL (ref 31.5–35.7)
MCV: 100 fL — ABNORMAL HIGH (ref 79–97)
Monocytes Absolute: 0.4 10*3/uL (ref 0.1–0.9)
Monocytes: 9 %
Neutrophils Absolute: 2.1 10*3/uL (ref 1.4–7.0)
Neutrophils: 45 %
Platelets: 154 10*3/uL (ref 150–379)
RBC: 4.33 x10E6/uL (ref 4.14–5.80)
RDW: 14.2 % (ref 12.3–15.4)
WBC: 4.6 10*3/uL (ref 3.4–10.8)

## 2015-04-14 LAB — RMSF, IGG, IFA

## 2015-04-14 LAB — SEDIMENTATION RATE

## 2015-04-14 LAB — LYME AB/WESTERN BLOT REFLEX
LYME DISEASE AB, QUANT, IGM: <0.8 index (ref 0.00–0.79)
Lyme IgG/IgM Ab: <0.91 {ISR} (ref 0.00–0.90)

## 2015-04-14 LAB — ROCKY MTN SPOTTED FVR ABS PNL(IGG+IGM)
RMSF IgG: POSITIVE — AB
RMSF IgM: 0.27 index (ref 0.00–0.89)

## 2015-04-15 ENCOUNTER — Other Ambulatory Visit: Payer: Medicare Other

## 2015-04-15 ENCOUNTER — Other Ambulatory Visit: Payer: Self-pay

## 2015-04-15 DIAGNOSIS — T07XXXA Unspecified multiple injuries, initial encounter: Secondary | ICD-10-CM

## 2015-04-15 MED ORDER — DOXYCYCLINE HYCLATE 100 MG PO TABS: 100.0000 mg | ORAL_TABLET | Freq: Two times a day (BID) | ORAL | Status: DC

## 2015-04-15 NOTE — Progress Notes (Signed)
Lab only 

## 2015-04-16 LAB — SEDIMENTATION RATE: SED RATE: 2 mm/h (ref 0–30)

## 2015-05-25 ENCOUNTER — Encounter: Payer: Self-pay | Admitting: Family Medicine

## 2015-05-25 ENCOUNTER — Ambulatory Visit (INDEPENDENT_AMBULATORY_CARE_PROVIDER_SITE_OTHER): Payer: Medicare Other | Admitting: Family Medicine

## 2015-05-25 VITALS — BP 155/72 | HR 70 | Temp 97.0°F | Ht 68.0 in | Wt 169.6 lb

## 2015-05-25 DIAGNOSIS — J209 Acute bronchitis, unspecified: Secondary | ICD-10-CM

## 2015-05-25 MED ORDER — AZITHROMYCIN 250 MG PO TABS: ORAL_TABLET | ORAL | Status: DC

## 2015-05-25 MED ORDER — BENZONATATE 100 MG PO CAPS: 100.0000 mg | ORAL_CAPSULE | Freq: Three times a day (TID) | ORAL | Status: DC | PRN

## 2015-05-25 NOTE — Patient Instructions (Signed)
Great to meet you!  Take all of the antibiotics Use tessalon for cough  Increase flonase to twice daily for only 1 week.   Come back if you get worse or do not get better.    Acute Bronchitis Bronchitis is when the airways that extend from the windpipe into the lungs get red, puffy, and painful (inflamed). Bronchitis often causes thick spit (mucus) to develop. This leads to a cough. A cough is the most common symptom of bronchitis. In acute bronchitis, the condition usually begins suddenly and goes away over time (usually in 2 weeks). Smoking, allergies, and asthma can make bronchitis worse. Repeated episodes of bronchitis may cause more lung problems. HOME CARE  Rest.  Drink enough fluids to keep your pee (urine) clear or pale yellow (unless you need to limit fluids as told by your doctor).  Only take over-the-counter or prescription medicines as told by your doctor.  Avoid smoking and secondhand smoke. These can make bronchitis worse. If you are a smoker, think about using nicotine gum or skin patches. Quitting smoking will help your lungs heal faster.  Reduce the chance of getting bronchitis again by:  Washing your hands often.  Avoiding people with cold symptoms.  Trying not to touch your hands to your mouth, nose, or eyes.  Follow up with your doctor as told. GET HELP IF: Your symptoms do not improve after 1 week of treatment. Symptoms include:  Cough.  Fever.  Coughing up thick spit.  Body aches.  Chest congestion.  Chills.  Shortness of breath.  Sore throat. GET HELP RIGHT AWAY IF:   You have an increased fever.  You have chills.  You have severe shortness of breath.  You have bloody thick spit (sputum).  You throw up (vomit) often.  You lose too much body fluid (dehydration).  You have a severe headache.  You faint. MAKE SURE YOU:   Understand these instructions.  Will watch your condition.  Will get help right away if you are not doing  well or get worse.   This information is not intended to replace advice given to you by your health care provider. Make sure you discuss any questions you have with your health care provider.   Document Released: 11/09/2007 Document Revised: 01/23/2013 Document Reviewed: 11/13/2012 Elsevier Interactive Patient Education [employer].

## 2015-05-25 NOTE — Progress Notes (Signed)
   HPI  Patient presents today here to discuss cough.  Patient explains that for the last 2 weeks he's had cough, sore throat, congestion, periods described his cough as persistent productive cough of yellowish-green sputum, worse at night. No dyspnea, chest pain, palpitations. Normal oral intake. No fever He does have mild malaise.  He recently took a course of doxycycline for tick exposure  PMH: Smoking status noted ROS: Per HPI  Objective: BP 155/72 mmHg  Pulse 70  Temp(Src) 97 F (36.1 C)  Ht 5\' 8"  (1.727 m)  Wt 169 lb 9.6 oz (76.93 kg)  BMI 25.79 kg/m2 Gen: NAD, alert, cooperative with exam HEENT: NCAT, nares clear, no facial tenderness to palpation, TMs normal bilaterally CV: RRR, good S1/S2, no murmur Resp: CTABL, no wheezes, non-labored Ext: No edema, warm Neuro: Alert and oriented, No gross deficits  Assessment and plan:  # Acute bronchitis Treat with azithromycin Tessalon Perles for cough Increase Flonase to twice a day 1 week Return to clinic if worsening or does not improve as expected Probiotic or yogurt  daily   Meds ordered this encounter  Medications  . benzonatate (TESSALON) 100 MG capsule    Sig: Take 1 capsule (100 mg total) by mouth 3 (three) times daily as needed for cough.    Dispense:  20 capsule    Refill:  0  . azithromycin (ZITHROMAX) 250 MG tablet    Sig: Take 2 tablets on day 1 and 1 tablet daily after that    Dispense:  6 tablet    Refill:  0    Laroy Apple, MD Tristan Schroeder [facility] 05/25/2015, 12:59 PM

## 2015-06-04 ENCOUNTER — Other Ambulatory Visit: Payer: Medicare Other

## 2015-06-04 DIAGNOSIS — G629 Polyneuropathy, unspecified: Secondary | ICD-10-CM

## 2015-06-04 DIAGNOSIS — E559 Vitamin D deficiency, unspecified: Secondary | ICD-10-CM

## 2015-06-04 DIAGNOSIS — E785 Hyperlipidemia, unspecified: Secondary | ICD-10-CM

## 2015-06-04 DIAGNOSIS — R972 Elevated prostate specific antigen [PSA]: Secondary | ICD-10-CM

## 2015-06-04 DIAGNOSIS — I10 Essential (primary) hypertension: Secondary | ICD-10-CM

## 2015-06-05 LAB — CBC WITH DIFFERENTIAL/PLATELET
BASOS: 1 %
Basophils Absolute: 0 10*3/uL (ref 0.0–0.2)
EOS (ABSOLUTE): 0.1 10*3/uL (ref 0.0–0.4)
EOS: 2 %
HEMATOCRIT: 41.4 % (ref 37.5–51.0)
HEMOGLOBIN: 14.7 g/dL (ref 12.6–17.7)
IMMATURE GRANS (ABS): 0 10*3/uL (ref 0.0–0.1)
Immature Granulocytes: 0 %
LYMPHS: 45 %
Lymphocytes Absolute: 2 10*3/uL (ref 0.7–3.1)
MCH: 33.9 pg — AB (ref 26.6–33.0)
MCHC: 35.5 g/dL (ref 31.5–35.7)
MCV: 95 fL (ref 79–97)
Monocytes Absolute: 0.5 10*3/uL (ref 0.1–0.9)
Monocytes: 11 %
NEUTROS ABS: 1.8 10*3/uL (ref 1.4–7.0)
Neutrophils: 41 %
PLATELETS: 157 10*3/uL (ref 150–379)
RBC: 4.34 x10E6/uL (ref 4.14–5.80)
RDW: 13.9 % (ref 12.3–15.4)
WBC: 4.4 10*3/uL (ref 3.4–10.8)

## 2015-06-05 LAB — BMP8+EGFR
BUN/Creatinine Ratio: 13 (ref 10–22)
BUN: 14 mg/dL (ref 8–27)
CALCIUM: 9.7 mg/dL (ref 8.6–10.2)
CO2: 25 mmol/L (ref 18–29)
CREATININE: 1.04 mg/dL (ref 0.76–1.27)
Chloride: 104 mmol/L (ref 96–106)
GFR, EST AFRICAN AMERICAN: 81 mL/min/{1.73_m2} (ref >59)
GFR, EST NON AFRICAN AMERICAN: 70 mL/min/{1.73_m2} (ref >59)
Glucose: 97 mg/dL (ref 65–99)
POTASSIUM: 4.6 mmol/L (ref 3.5–5.2)
SODIUM: 143 mmol/L (ref 134–144)

## 2015-06-05 LAB — HEPATIC FUNCTION PANEL
ALK PHOS: 68 IU/L (ref 39–117)
ALT: 14 IU/L (ref 0–44)
AST: 18 IU/L (ref 0–40)
Albumin: 4.1 g/dL (ref 3.5–4.8)
BILIRUBIN TOTAL: 0.7 mg/dL (ref 0.0–1.2)
BILIRUBIN, DIRECT: 0.16 mg/dL (ref 0.00–0.40)
Total Protein: 6.3 g/dL (ref 6.0–8.5)

## 2015-06-05 LAB — LIPID PANEL
CHOLESTEROL TOTAL: 123 mg/dL (ref 100–199)
Chol/HDL Ratio: 3.3 ratio units (ref 0.0–5.0)
HDL: 37 mg/dL — ABNORMAL LOW (ref >39)
LDL Calculated: 69 mg/dL (ref 0–99)
Triglycerides: 86 mg/dL (ref 0–149)
VLDL Cholesterol Cal: 17 mg/dL (ref 5–40)

## 2015-06-05 LAB — VITAMIN D 25 HYDROXY (VIT D DEFICIENCY, FRACTURES): VIT D 25 HYDROXY: 53.5 ng/mL (ref 30.0–100.0)

## 2015-06-11 ENCOUNTER — Encounter: Payer: Self-pay | Admitting: Family Medicine

## 2015-06-11 ENCOUNTER — Ambulatory Visit (INDEPENDENT_AMBULATORY_CARE_PROVIDER_SITE_OTHER): Payer: Medicare Other | Admitting: Family Medicine

## 2015-06-11 VITALS — BP 117/72 | HR 75 | Temp 96.8°F | Ht 68.0 in | Wt 165.0 lb

## 2015-06-11 DIAGNOSIS — E785 Hyperlipidemia, unspecified: Secondary | ICD-10-CM | POA: Diagnosis not present

## 2015-06-11 DIAGNOSIS — I10 Essential (primary) hypertension: Secondary | ICD-10-CM | POA: Diagnosis not present

## 2015-06-11 DIAGNOSIS — J301 Allergic rhinitis due to pollen: Secondary | ICD-10-CM | POA: Diagnosis not present

## 2015-06-11 DIAGNOSIS — E559 Vitamin D deficiency, unspecified: Secondary | ICD-10-CM

## 2015-06-11 DIAGNOSIS — G629 Polyneuropathy, unspecified: Secondary | ICD-10-CM

## 2015-06-11 MED ORDER — FLUTICASONE PROPIONATE 50 MCG/ACT NA SUSP: 2.0000 | Freq: Every day | NASAL | Status: DC

## 2015-06-11 NOTE — Patient Instructions (Addendum)
Medicare Annual Wellness Visit  [facility] and the medical providers at [facility] strive to bring you the best medical care.  In doing so we not only want to address your current medical conditions and concerns but also to detect new conditions early and prevent illness, disease and health-related problems.    Medicare offers a yearly Wellness Visit which allows our clinical staff to assess your need for preventative services including immunizations, lifestyle education, counseling to decrease risk of preventable diseases and screening for fall risk and other medical concerns.    This visit is provided free of charge (no copay) for all Medicare recipients. The clinical pharmacists at [facility] have begun to conduct these Wellness Visits which will also include a thorough review of all your medications.    As you primary medical provider recommend that you make an appointment for your Annual Wellness Visit if you have not done so already this year.  You may set up this appointment before you leave today or you may call back [phone] and schedule an appointment.  Please make sure when you call that you mention that you are scheduling your Annual Wellness Visit with the clinical pharmacist so that the appointment may be made for the proper length of time.     Continue current medications. Continue good therapeutic lifestyle changes which include good diet and exercise. Fall precautions discussed with patient. If an FOBT was given today- please return it to our front desk. If you are over 31 years old - you may need Prevnar 15 or the adult Pneumonia vaccine.  **Flu shots are available--- please call and schedule a FLU-CLINIC appointment**  After your visit with [place] today you will receive a survey in the mail or online from [employer] regarding your care with [place]. Please take a moment to fill this out. Your feedback is very  important to [place] as you can help [place] better understand your patient needs as well as improve your experience and satisfaction. WE CARE ABOUT YOU!!!   Always be careful and do not put yourself at risk for falling Continue to protect herself from deer tick bites This winter drink plenty of fluids stay well hydrated and use nasal saline and Flonase and keep the house as cool as possible

## 2015-06-11 NOTE — Progress Notes (Signed)
Subjective:    Patient ID: Mario Smith, male    DOB: 11/15/1940, 75 y.o.   MRN: [id number]  HPI Pt here for follow up and management of chronic medical problems which includes hyperlipidemia, and hypertension. He is taking medications regularly. Patient is doing well today with no specific complaints. He has had his lab work done and we will review this with him today he has lost 5 pounds of weight since his last visit in mid December. The patient denies chest pain shortness of breath trouble swallowing heartburn indigestion nausea vomiting diarrhea or blood in the stool. He is passing his water without problems. He says he's been walking a lot and hunting a lot and he attributes his weight loss to the exercise he's been getting over the past 3 weeks. Lab work results were reviewed with the patient. All lab work was excellent including renal function and liver tests cholesterol and CBC.      Patient Active Problem List   Diagnosis Date Noted  . Hyperlipidemia 05/27/2013  . Vitamin D deficiency 05/27/2013  . Hypertension 02/11/2010   Outpatient Encounter Prescriptions as of 06/11/2015  Medication Sig  . aspirin 81 MG EC tablet Take 81 mg by mouth daily.    . Cholecalciferol (VITAMIN D3) 2000 UNITS TABS Take 1 tablet by mouth daily.    . Coenzyme Q10 (CO Q 10 PO) Take 2 capsules by mouth daily.   . fluticasone (FLONASE) 50 MCG/ACT nasal spray Place 2 sprays into both nostrils daily.  Marland Kitchen losartan (COZAAR) 100 MG tablet TAKE 1 TABLET BY MOUTH EVERY DAY  . Omega-3 Fatty Acids (FISH OIL) 1000 MG CAPS Take 1 capsule by mouth 2 (two) times daily.    . rosuvastatin (CRESTOR) 20 MG tablet Take 1 tablet (20 mg total) by mouth daily.  . [DISCONTINUED] azithromycin (ZITHROMAX) 250 MG tablet Take 2 tablets on day 1 and 1 tablet daily after that  . [DISCONTINUED] benzonatate (TESSALON) 100 MG capsule Take 1 capsule (100 mg total) by mouth 3 (three) times daily as needed for cough.   No  facility-administered encounter medications on file as of 06/11/2015.      Review of Systems  Constitutional: Negative.   HENT: Negative.   Eyes: Negative.   Respiratory: Negative.   Cardiovascular: Negative.   Gastrointestinal: Negative.   Endocrine: Negative.   Genitourinary: Negative.   Musculoskeletal: Negative.   Skin: Negative.   Allergic/Immunologic: Negative.   Neurological: Negative.   Hematological: Negative.   Psychiatric/Behavioral: Negative.        Objective:   Physical Exam  Constitutional: He is oriented to person, place, and time. He appears well-developed and well-nourished.  HENT:  Head: Normocephalic and atraumatic.  Right Ear: External ear normal.  Left Ear: External ear normal.  Mouth/Throat: Oropharynx is clear and moist. No oropharyngeal exudate.  There is definite nasal congestion and turbinate swelling bilaterally  Eyes: Conjunctivae and EOM are normal. Pupils are equal, round, and reactive to light. Right eye exhibits no discharge. Left eye exhibits no discharge. No scleral icterus.  Neck: Normal range of motion. Neck supple. No thyromegaly present.  Cardiovascular: Normal rate, regular rhythm, normal heart sounds and intact distal pulses.   No murmur heard. At 72/m  Pulmonary/Chest: Effort normal and breath sounds normal. No respiratory distress. He has no wheezes. He has no rales. He exhibits no tenderness.  Clear anteriorly and posteriorly  Abdominal: Soft. Bowel sounds are normal. He exhibits no mass. There is no tenderness. There is  no rebound and no guarding.  Musculoskeletal: Normal range of motion. He exhibits no edema.  Lymphadenopathy:    He has no cervical adenopathy.  Neurological: He is alert and oriented to person, place, and time. He has normal reflexes. No cranial nerve deficit.  Skin: Skin is warm and dry. No rash noted.  Psychiatric: He has a normal mood and affect. His behavior is normal. Judgment and thought content normal.    Nursing note and vitals reviewed.  BP 117/72 mmHg  Pulse 75  Temp(Src) 96.8 F (36 C) (Oral)  Ht 5\' 8"  (1.727 m)  Wt 165 lb (74.844 kg)  BMI 25.09 kg/m2       Assessment & Plan:  1. Hyperlipidemia -All cholesterol numbers are excellent and he will continue with current treatment and therapeutic lifestyle changes  2. Vitamin D deficiency -Vitamin D level was excellent and he will continue with current vitamin D replacement  3. Hypertension -The blood pressure is good and he will continue with his current treatment  4. Polyneuropathy ([place]) -He has no complaints with this today.  Patient Instructions                       Medicare Annual Wellness Visit  [facility] and the medical providers at [facility] strive to bring you the best medical care.  In doing so we not only want to address your current medical conditions and concerns but also to detect new conditions early and prevent illness, disease and health-related problems.    Medicare offers a yearly Wellness Visit which allows our clinical staff to assess your need for preventative services including immunizations, lifestyle education, counseling to decrease risk of preventable diseases and screening for fall risk and other medical concerns.    This visit is provided free of charge (no copay) for all Medicare recipients. The clinical pharmacists at [facility] have begun to conduct these Wellness Visits which will also include a thorough review of all your medications.    As you primary medical provider recommend that you make an appointment for your Annual Wellness Visit if you have not done so already this year.  You may set up this appointment before you leave today or you may call back [phone] and schedule an appointment.  Please make sure when you call that you mention that you are scheduling your Annual Wellness Visit with the clinical pharmacist so that the appointment  may be made for the proper length of time.     Continue current medications. Continue good therapeutic lifestyle changes which include good diet and exercise. Fall precautions discussed with patient. If an FOBT was given today- please return it to our front desk. If you are over 6 years old - you may need Prevnar 83 or the adult Pneumonia vaccine.  **Flu shots are available--- please call and schedule a FLU-CLINIC appointment**  After your visit with [place] today you will receive a survey in the mail or online from [employer] regarding your care with [place]. Please take a moment to fill this out. Your feedback is very important to [place] as you can help [place] better understand your patient needs as well as improve your experience and satisfaction. WE CARE ABOUT YOU!!!   Always be careful and do not put yourself at risk for falling Continue to protect herself from deer tick bites This winter drink plenty of fluids stay well hydrated and use nasal saline and Flonase and keep the  house as cool as possible   Arrie Senate MD

## 2015-07-09 ENCOUNTER — Ambulatory Visit (INDEPENDENT_AMBULATORY_CARE_PROVIDER_SITE_OTHER): Payer: Medicare Other | Admitting: Family Medicine

## 2015-07-09 ENCOUNTER — Encounter: Payer: Self-pay | Admitting: Family Medicine

## 2015-07-09 VITALS — BP 134/77 | HR 74 | Temp 98.0°F | Ht 68.0 in | Wt 165.6 lb

## 2015-07-09 DIAGNOSIS — J309 Allergic rhinitis, unspecified: Secondary | ICD-10-CM | POA: Diagnosis not present

## 2015-07-09 MED ORDER — PREDNISONE 20 MG PO TABS: ORAL_TABLET | ORAL | Status: DC

## 2015-07-09 NOTE — Progress Notes (Signed)
BP 134/77 mmHg  Pulse 74  Temp(Src) 98 F (36.7 C) (Oral)  Ht 5\' 8"  (1.727 m)  Wt 165 lb 9.6 oz (75.116 kg)  BMI 25.19 kg/m2   Subjective:    Patient ID: Mario Smith, male    DOB: 07/09/1940, 75 y.o.   MRN: [id number]  HPI: Mario Smith is a 75 y.o. male presenting on 07/09/2015 for Cough and Sinusitis   HPI Sinus congestion and postnasal drainage Patient comes in because he has been having about a week course of sinus congestion and postnasal drainage and cough that is nonproductive and dry. He says the cough is a lot worse at night when he is laying flat and he feels like he gets so congested that he can't breathe at all and has to sit up at night. He denies any fevers or chills or shortness of breath or wheezing.  Relevant past medical, surgical, family and social history reviewed and updated as indicated. Interim medical history since our last visit reviewed. Allergies and medications reviewed and updated.  Review of Systems  Constitutional: Negative for fever and chills.  HENT: Positive for postnasal drip, rhinorrhea, sinus pressure and sore throat. Negative for congestion, ear discharge, ear pain, sneezing and voice change.   Eyes: Negative for pain, discharge, redness and visual disturbance.  Respiratory: Positive for cough. Negative for shortness of breath and wheezing.   Cardiovascular: Negative for chest pain and leg swelling.  Gastrointestinal: Negative for abdominal pain, diarrhea and constipation.  Genitourinary: Negative for difficulty urinating.  Musculoskeletal: Negative for back pain and gait problem.  Skin: Negative for rash.  Neurological: Negative for syncope, light-headedness and headaches.  All other systems reviewed and are negative.   Per HPI unless specifically indicated above     Medication List       This list is accurate as of: 07/09/15  4:09 PM.  Always use your most recent med list.               aspirin 81 MG EC tablet  Take 81 mg by  mouth daily.     CO Q 10 PO  Take 2 capsules by mouth daily.     Fish Oil 1000 MG Caps  Take 1 capsule by mouth 2 (two) times daily.     fluticasone 50 MCG/ACT nasal spray  Commonly known as:  FLONASE  Place 2 sprays into both nostrils daily.     losartan 100 MG tablet  Commonly known as:  COZAAR  TAKE 1 TABLET BY MOUTH EVERY DAY     predniSONE 20 MG tablet  Commonly known as:  DELTASONE  2 po at same time daily for 5 days     rosuvastatin 20 MG tablet  Commonly known as:  CRESTOR  Take 1 tablet (20 mg total) by mouth daily.     Vitamin D3 2000 units Tabs  Take 1 tablet by mouth daily.           Objective:    BP 134/77 mmHg  Pulse 74  Temp(Src) 98 F (36.7 C) (Oral)  Ht 5\' 8"  (1.727 m)  Wt 165 lb 9.6 oz (75.116 kg)  BMI 25.19 kg/m2  Wt Readings from Last 3 Encounters:  07/09/15 165 lb 9.6 oz (75.116 kg)  06/11/15 165 lb (74.844 kg)  05/25/15 169 lb 9.6 oz (76.93 kg)    Physical Exam  Constitutional: He is oriented to person, place, and time. He appears well-developed and well-nourished. No distress.  HENT:  Right Ear: Tympanic membrane, external ear and ear canal normal.  Left Ear: Tympanic membrane, external ear and ear canal normal.  Nose: Mucosal edema and rhinorrhea present. No sinus tenderness. No epistaxis. Right sinus exhibits maxillary sinus tenderness. Right sinus exhibits no frontal sinus tenderness. Left sinus exhibits maxillary sinus tenderness. Left sinus exhibits no frontal sinus tenderness.  Mouth/Throat: Uvula is midline and mucous membranes are normal. Posterior oropharyngeal edema and posterior oropharyngeal erythema present. No oropharyngeal exudate or tonsillar abscesses.  Eyes: Conjunctivae and EOM are normal. Pupils are equal, round, and reactive to light. Right eye exhibits no discharge. No scleral icterus.  Neck: Neck supple. No thyromegaly present.  Cardiovascular: Normal rate, regular rhythm, normal heart sounds and intact distal pulses.    No murmur heard. Pulmonary/Chest: Effort normal and breath sounds normal. No respiratory distress. He has no wheezes.  Abdominal: He exhibits no distension.  Musculoskeletal: Normal range of motion. He exhibits no edema.  Lymphadenopathy:    He has no cervical adenopathy.  Neurological: He is alert and oriented to person, place, and time. Coordination normal.  Skin: Skin is warm and dry. No rash noted. He is not diaphoretic.  Psychiatric: He has a normal mood and affect. His behavior is normal.  Vitals reviewed.     Assessment & Plan:       Problem List Items Addressed This Visit    None    Visit Diagnoses    Allergic rhinitis, unspecified allergic rhinitis type    -  Primary    Already did the Z-Pak a month ago and is on Flonase, will do oral steroids and see if we can clear it up    Relevant Medications    predniSONE (DELTASONE) 20 MG tablet       Follow up plan: Return if symptoms worsen or fail to improve.  Counseling provided for all of the vaccine components No orders of the defined types were placed in this encounter.    Caryl Pina, MD [facility] 07/09/2015, 4:09 PM

## 2015-07-15 ENCOUNTER — Encounter: Payer: Self-pay | Admitting: Family Medicine

## 2015-07-15 ENCOUNTER — Ambulatory Visit (INDEPENDENT_AMBULATORY_CARE_PROVIDER_SITE_OTHER): Payer: Medicare Other | Admitting: Family Medicine

## 2015-07-15 VITALS — BP 146/84 | HR 57 | Temp 97.2°F | Ht 68.0 in | Wt 166.8 lb

## 2015-07-15 DIAGNOSIS — J029 Acute pharyngitis, unspecified: Secondary | ICD-10-CM

## 2015-07-15 MED ORDER — BUDESONIDE-FORMOTEROL FUMARATE 80-4.5 MCG/ACT IN AERO: 2.0000 | INHALATION_SPRAY | Freq: Two times a day (BID) | RESPIRATORY_TRACT | Status: DC

## 2015-07-15 NOTE — Progress Notes (Signed)
   Subjective:    Patient ID: Mario Smith, male    DOB: 1940-07-07, 75 y.o.   MRN: [id number]  HPI 75 year old with sore throat. Symptoms go back several weeks ago. He was treated initially for bronchitis by history and then seen again last week and given oral prednisone for persistent cough. Sore throat began 2 days ago. He denies any fever or headache. There is some drainage but when he coughs it up it's only white or clear. He has been using salt water gargles  Patient Active Problem List   Diagnosis Date Noted  . Hyperlipidemia 05/27/2013  . Vitamin D deficiency 05/27/2013  . Hypertension 02/11/2010   Outpatient Encounter Prescriptions as of 07/15/2015  Medication Sig  . aspirin 81 MG EC tablet Take 81 mg by mouth daily.    . Cholecalciferol (VITAMIN D3) 2000 UNITS TABS Take 1 tablet by mouth daily.    . Coenzyme Q10 (CO Q 10 PO) Take 2 capsules by mouth daily.   . fluticasone (FLONASE) 50 MCG/ACT nasal spray Place 2 sprays into both nostrils daily.  Marland Kitchen losartan (COZAAR) 100 MG tablet TAKE 1 TABLET BY MOUTH EVERY DAY  . Omega-3 Fatty Acids (FISH OIL) 1000 MG CAPS Take 1 capsule by mouth 2 (two) times daily.    . rosuvastatin (CRESTOR) 20 MG tablet Take 1 tablet (20 mg total) by mouth daily.  . [DISCONTINUED] predniSONE (DELTASONE) 20 MG tablet 2 po at same time daily for 5 days   No facility-administered encounter medications on file as of 07/15/2015.      Review of Systems  Constitutional: Negative.   HENT: Positive for postnasal drip and sore throat.   Respiratory: Positive for cough.   Cardiovascular: Negative.        Objective:   Physical Exam  Constitutional: He appears well-developed and well-nourished.  HENT:  Mouth/Throat: Oropharynx is clear and moist.  Throat is 3+ red without exudate or significant adenopathy in his neck There is no tenderness over the thyroid to suggest thyroiditis.  Cardiovascular: Normal rate and regular rhythm.   Pulmonary/Chest: Effort  normal.          Assessment & Plan:  1. Acute pharyngitis, unspecified etiology There is inflammation in his throat. It does not appear to be thrush. Bacterial infections are unlikely. He would seem like this is a continuation of the process of began several weeks ago that has been treated with Zithromax and most recently prednisone. He still has a persistent mostly dry cough. I've given him a sample of Symbicort. I'm hoping that the aerosol will help the inflammation in his throat as well as in his bronchi and help both symptoms. He was reminded to rinse his mouth out with water after use of Symbicort and also reminded to continue using salt water gargles for the throat.  Wardell Honour MD

## 2015-07-17 ENCOUNTER — Telehealth: Payer: Self-pay | Admitting: Family Medicine

## 2015-07-17 NOTE — Telephone Encounter (Signed)
Please call a prescription in for a Z-Pak for this patient and have him gargle with warm salty water and take Tylenol for aches pains and fever

## 2015-07-17 NOTE — Telephone Encounter (Signed)
Called to [employer]. Patients wife notified

## 2015-07-20 ENCOUNTER — Ambulatory Visit (INDEPENDENT_AMBULATORY_CARE_PROVIDER_SITE_OTHER): Payer: Medicare Other | Admitting: Family Medicine

## 2015-07-20 ENCOUNTER — Encounter: Payer: Self-pay | Admitting: Family Medicine

## 2015-07-20 VITALS — BP 135/81 | HR 99 | Temp 97.3°F | Ht 68.0 in | Wt 164.8 lb

## 2015-07-20 DIAGNOSIS — K136 Irritative hyperplasia of oral mucosa: Secondary | ICD-10-CM

## 2015-07-20 DIAGNOSIS — K1379 Other lesions of oral mucosa: Secondary | ICD-10-CM | POA: Diagnosis not present

## 2015-07-20 MED ORDER — NYSTATIN 100000 UNIT/ML MT SUSP: 5.0000 mL | Freq: Four times a day (QID) | OROMUCOSAL | Status: DC

## 2015-07-20 NOTE — Progress Notes (Signed)
   HPI   Patient presents today here for oral irritation.  Patient explains that he started Symbicort last week for persistent cough, he rinse his mouth out regularly after taking it but feels that he's developed thrush. Describes large white plaques that have been able to peel off of his mucous membranes over the last few days along with burning type irritation in his oral mucosa.  He is also being treated with azithromycin currently and feels that he is improving a little bit.  Note that he had original symptoms on December 9 and has been treated treated with azithromycin twice during this time.  He's also been treated with prednisone, now with Symbicort and another course of azithromycin.   He denies fever, chills, sweats, or difficulty breathing currently.  PMH: Smoking status noted ROS: Per HPI  Objective: BP 135/81 mmHg  Pulse 99  Temp(Src) 97.3 F (36.3 C) (Oral)  Ht 5\' 8"  (1.727 m)  Wt 164 lb 12.8 oz (74.753 kg)  BMI 25.06 kg/m2 Gen: NAD, alert, cooperative with exam HEENT: NCAT, scattered erythema, one spot with shallow ulceration consistent with recent irritation bleeding, no white plaques, nares erythematous with swollen turbinates CV: RRR, good S1/S2, no murmur Resp: CTABL, no wheezes, non-labored Ext: No edema, warm Neuro: Alert and oriented, No gross deficits  Assessment and plan:  # Oral irritation Treating his thrush, possibly so considering his history of oral white plaques that have been developing and coming off. Nystatin swish and swallow Follow-up 2-3 weeks if no improvement in cough, consider PFTs and/or pre-post albuterol PFTs I think is possible that he has a mild underlying COPD versus asthma type process that gets uncovered with respiratory illness    Laroy Apple, MD [facility] 07/20/2015, 1:14 PM

## 2015-07-20 NOTE — Patient Instructions (Addendum)
Great to see you!  I like the idea of the symbicort, finish the inhaler, do rinse out your mouth after wards  I have prescribed Nystatin swish and swallow, Swish it retaining it in the mouth for several minutes prior to swallowing, 4 times a day.   Thrush, Adult Ritta Slot, also called oral candidiasis, is a fungal infection that develops in the mouth and throat and on the tongue. It causes white patches to form on the mouth and tongue. Ritta Slot is most common in older adults, but it can occur at any age.  Many cases of thrush are mild, but this infection can also be more serious. Ritta Slot can be a recurring problem for people who have chronic illnesses or who take medicines that limit the body's ability to fight infection. Because these people have difficulty fighting infections, the fungus that causes thrush can spread throughout the body. This can cause life-threatening blood or organ infections. CAUSES  Ritta Slot is usually caused by a yeast called Candida albicans. This fungus is normally present in small amounts in the mouth and on other mucous membranes. It usually causes no harm. However, when conditions are present that allow the fungus to grow uncontrolled, it invades surrounding tissues and becomes an infection. Less often, other Candida species can also lead to thrush.  RISK FACTORS Ritta Slot is more likely to develop in the following people:  People with an impaired ability to fight infection (weakened immune system).   Older adults.   People with HIV.   People with diabetes.   People with dry mouth (xerostomia).   Pregnant women.   People with poor dental care, especially those who have false teeth.   People who use antibiotic medicines.  SIGNS AND SYMPTOMS  Ritta Slot can be a mild infection that causes no symptoms. If symptoms develop, they may include:   A burning feeling in the mouth and throat. This can occur at the start of a thrush infection.   White patches that adhere  to the mouth and tongue. The tissue around the patches may be red, raw, and painful. If rubbed (during tooth brushing, for example), the patches and the tissue of the mouth may bleed easily.   A bad taste in the mouth or difficulty tasting foods.   Cottony feeling in the mouth.   Pain during eating and swallowing. DIAGNOSIS  Your health care provider can usually diagnose thrush by looking in your mouth and asking you questions about your health.  TREATMENT  Medicines that help prevent the growth of fungi (antifungals) are the standard treatment for thrush. These medicines are either applied directly to the affected area (topical) or swallowed (oral). The treatment will depend on the severity of the condition.  Mild Thrush Mild cases of thrush may clear up with the use of an antifungal mouth rinse or lozenges. Treatment usually lasts about 14 days.  Moderate to Severe Thrush  More severe thrush infections that have spread to the esophagus are treated with an oral antifungal medicine. A topical antifungal medicine may also be used.   For some severe infections, a treatment period longer than 14 days may be needed.   Oral antifungal medicines are almost never used during pregnancy because the fetus may be harmed. However, if a pregnant woman has a rare, severe thrush infection that has spread to her blood, oral antifungal medicines may be used. In this case, the risk of harm to the mother and fetus from the severe thrush infection may be greater than the  risk posed by the use of antifungal medicines.  Persistent or Recurrent Thrush For cases of thrush that do not go away or keep coming back, treatment may involve the following:   Treatment may be needed twice as long as the symptoms last.   Treatment will include both oral and topical antifungal medicines.   People with weakened immune systems can take an antifungal medicine on a continuous basis to prevent thrush infections.  It is  important to treat conditions that make you more likely to get thrush, such as diabetes or HIV.  HOME CARE INSTRUCTIONS   Only take over-the-counter or prescription medicine as directed by your health care provider. Talk to your health care provider about an over-the-counter medicine called gentian violet, which kills bacteria and fungi.   Eat plain, unflavored yogurt as directed by your health care provider. Check the label to make sure the yogurt contains live cultures. This yogurt can help healthy bacteria grow in the mouth that can stop the growth of the fungus that causes thrush.   Try these measures to help reduce the discomfort of thrush:   Drink cold liquids such as water or iced tea.   Try flavored ice treats or frozen juices.   Eat foods that are easy to swallow, such as gelatin, ice cream, or custard.   If the patches in your mouth are painful, try drinking from a straw.   Rinse your mouth several times a day with a warm saltwater rinse. You can make the saltwater mixture with 1 tsp (6 g) of salt in 8 fl oz (0.2 L) of warm water.   If you wear dentures, remove the dentures before going to bed, brush them vigorously, and soak them in a cleaning solution as directed by your health care provider.   Women who are breastfeeding should clean their nipples with an antifungal medicine as directed by their health care provider. Dry the nipples after breastfeeding. Applying lanolin-containing body lotion may help relieve nipple soreness.  SEEK MEDICAL CARE IF:  Your symptoms are getting worse or are not improving within 7 days of starting treatment.   You have symptoms of spreading infection, such as white patches on the skin outside of the mouth.   You are nursing and you have redness, burning, or pain in the nipples that is not relieved with treatment.  MAKE SURE YOU:  Understand these instructions.  Will watch your condition.  Will get help right away if you are  not doing well or get worse.   This information is not intended to replace advice given to you by your health care provider. Make sure you discuss any questions you have with your health care provider.   Document Released: 02/16/2004 Document Revised: 06/13/2014 Document Reviewed: 12/24/2012 Elsevier Interactive Patient Education [employer].

## 2015-09-15 ENCOUNTER — Other Ambulatory Visit: Payer: Self-pay | Admitting: Family Medicine

## 2015-09-15 MED ORDER — LOSARTAN POTASSIUM 100 MG PO TABS: 100.0000 mg | ORAL_TABLET | Freq: Every day | ORAL | Status: DC

## 2015-09-15 NOTE — Telephone Encounter (Signed)
rx sent to pharmacy

## 2015-10-07 ENCOUNTER — Other Ambulatory Visit: Payer: Medicare Other

## 2015-10-07 DIAGNOSIS — E559 Vitamin D deficiency, unspecified: Secondary | ICD-10-CM

## 2015-10-07 DIAGNOSIS — E785 Hyperlipidemia, unspecified: Secondary | ICD-10-CM

## 2015-10-07 DIAGNOSIS — I10 Essential (primary) hypertension: Secondary | ICD-10-CM

## 2015-10-08 LAB — HEPATIC FUNCTION PANEL
ALBUMIN: 3.9 g/dL (ref 3.5–4.8)
ALT: 8 IU/L (ref 0–44)
AST: 16 IU/L (ref 0–40)
Alkaline Phosphatase: 69 IU/L (ref 39–117)
Bilirubin Total: 0.7 mg/dL (ref 0.0–1.2)
Bilirubin, Direct: 0.23 mg/dL (ref 0.00–0.40)
TOTAL PROTEIN: 6.2 g/dL (ref 6.0–8.5)

## 2015-10-08 LAB — CBC WITH DIFFERENTIAL/PLATELET
BASOS ABS: 0 10*3/uL (ref 0.0–0.2)
BASOS: 0 %
EOS (ABSOLUTE): 0.1 10*3/uL (ref 0.0–0.4)
Eos: 3 %
HEMATOCRIT: 38.5 % (ref 37.5–51.0)
Hemoglobin: 13.3 g/dL (ref 12.6–17.7)
IMMATURE GRANS (ABS): 0 10*3/uL (ref 0.0–0.1)
IMMATURE GRANULOCYTES: 0 %
LYMPHS: 41 %
Lymphocytes Absolute: 1.8 10*3/uL (ref 0.7–3.1)
MCH: 34.2 pg — AB (ref 26.6–33.0)
MCHC: 34.5 g/dL (ref 31.5–35.7)
MCV: 99 fL — ABNORMAL HIGH (ref 79–97)
Monocytes Absolute: 0.6 10*3/uL (ref 0.1–0.9)
Monocytes: 14 %
NEUTROS PCT: 42 %
Neutrophils Absolute: 1.8 10*3/uL (ref 1.4–7.0)
Platelets: 158 10*3/uL (ref 150–379)
RBC: 3.89 x10E6/uL — AB (ref 4.14–5.80)
RDW: 16.3 % — AB (ref 12.3–15.4)
WBC: 4.3 10*3/uL (ref 3.4–10.8)

## 2015-10-08 LAB — BMP8+EGFR
BUN/Creatinine Ratio: 15 (ref 10–24)
BUN: 15 mg/dL (ref 8–27)
CALCIUM: 9.3 mg/dL (ref 8.6–10.2)
CO2: 25 mmol/L (ref 18–29)
Chloride: 103 mmol/L (ref 96–106)
Creatinine, Ser: 1.01 mg/dL (ref 0.76–1.27)
GFR calc Af Amer: 84 mL/min/{1.73_m2} (ref >59)
GFR, EST NON AFRICAN AMERICAN: 72 mL/min/{1.73_m2} (ref >59)
GLUCOSE: 95 mg/dL (ref 65–99)
POTASSIUM: 4.5 mmol/L (ref 3.5–5.2)
Sodium: 143 mmol/L (ref 134–144)

## 2015-10-08 LAB — NMR, LIPOPROFILE
Cholesterol: 100 mg/dL (ref 100–199)
HDL CHOLESTEROL BY NMR: 35 mg/dL — AB (ref >39)
HDL PARTICLE NUMBER: 21.5 umol/L — AB (ref >=30.5)
LDL PARTICLE NUMBER: 700 nmol/L (ref <1000)
LDL Size: 20.4 nm (ref >20.5)
LDL-C: 53 mg/dL (ref 0–99)
LP-IR Score: 39 (ref <=45)
Small LDL Particle Number: 395 nmol/L (ref <=527)
Triglycerides by NMR: 62 mg/dL (ref 0–149)

## 2015-10-08 LAB — VITAMIN D 25 HYDROXY (VIT D DEFICIENCY, FRACTURES): VIT D 25 HYDROXY: 56.5 ng/mL (ref 30.0–100.0)

## 2015-10-12 ENCOUNTER — Encounter: Payer: Self-pay | Admitting: Family Medicine

## 2015-10-12 ENCOUNTER — Ambulatory Visit (INDEPENDENT_AMBULATORY_CARE_PROVIDER_SITE_OTHER): Payer: Medicare Other | Admitting: Family Medicine

## 2015-10-12 VITALS — BP 152/80 | HR 89 | Temp 97.4°F | Ht 68.0 in | Wt 167.0 lb

## 2015-10-12 DIAGNOSIS — I10 Essential (primary) hypertension: Secondary | ICD-10-CM | POA: Diagnosis not present

## 2015-10-12 DIAGNOSIS — E559 Vitamin D deficiency, unspecified: Secondary | ICD-10-CM

## 2015-10-12 DIAGNOSIS — Z1211 Encounter for screening for malignant neoplasm of colon: Secondary | ICD-10-CM | POA: Diagnosis not present

## 2015-10-12 DIAGNOSIS — H532 Diplopia: Secondary | ICD-10-CM

## 2015-10-12 DIAGNOSIS — E785 Hyperlipidemia, unspecified: Secondary | ICD-10-CM

## 2015-10-12 DIAGNOSIS — G629 Polyneuropathy, unspecified: Secondary | ICD-10-CM

## 2015-10-12 NOTE — Progress Notes (Signed)
Subjective:    Patient ID: [facility], male    DOB: Mar 23, 1941, 75 y.o.   MRN: [id number]  HPI Pt here for follow up and management of chronic medical problems which includes hypertension and hyperlipidemia. He is taking medications regularly.The patient is doing well overall with no specific complaints. His blood pressure on initial check today was elevated at 152. The patient brings in blood pressures for review from the outside and the majority of these are well within the normal range. He has had a recent upper respiratory infection with cough and congestion and finally seemed to get better after taking a Z-Pak. He otherwise denies chest pain or shortness of breath. He has no trouble swallowing with heartburn indigestion nausea vomiting diarrhea or blood in the stool. He is passing his water without problems. His recent lab work was reviewed with him today. All cholesterol numbers kidney function test liver function test were good. His blood pressure as mentioned was a little bit high when he came into the building today. He has had some double vision and they're trying to correct this with his glasses.      Patient Active Problem List   Diagnosis Date Noted  . Hyperlipidemia 05/27/2013  . Vitamin D deficiency 05/27/2013  . Hypertension 02/11/2010   Outpatient Encounter Prescriptions as of 10/12/2015  Medication Sig  . aspirin 81 MG EC tablet Take 81 mg by mouth daily.    . Cholecalciferol (VITAMIN D3) 2000 UNITS TABS Take 1 tablet by mouth daily.    . Coenzyme Q10 (CO Q 10 PO) Take 2 capsules by mouth daily.   . fluticasone (FLONASE) 50 MCG/ACT nasal spray Place 2 sprays into both nostrils daily.  Marland Kitchen losartan (COZAAR) 100 MG tablet Take 1 tablet (100 mg total) by mouth daily.  Marland Kitchen nystatin (MYCOSTATIN) 100000 UNIT/ML suspension Take 5 mLs (500,000 Units total) by mouth 4 (four) times daily.  . Omega-3 Fatty Acids (FISH OIL) 1000 MG CAPS Take 1 capsule by mouth 2 (two) times daily.    .  rosuvastatin (CRESTOR) 20 MG tablet Take 1 tablet (20 mg total) by mouth daily.  . [DISCONTINUED] azithromycin (ZITHROMAX) 250 MG tablet   . [DISCONTINUED] budesonide-formoterol (SYMBICORT) 80-4.5 MCG/ACT inhaler Inhale 2 puffs into the lungs 2 (two) times daily.   No facility-administered encounter medications on file as of 10/12/2015.      Review of Systems  Constitutional: Negative.   HENT: Negative.   Eyes: Negative.   Respiratory: Negative.   Cardiovascular: Negative.   Gastrointestinal: Negative.   Endocrine: Negative.   Genitourinary: Negative.   Musculoskeletal: Negative.   Skin: Negative.   Allergic/Immunologic: Negative.   Neurological: Negative.   Hematological: Negative.   Psychiatric/Behavioral: Negative.        Objective:   Physical Exam  Constitutional: He is oriented to person, place, and time. He appears well-developed and well-nourished. No distress.  HENT:  Head: Normocephalic and atraumatic.  Right Ear: External ear normal.  Left Ear: External ear normal.  Nose: Nose normal.  Mouth/Throat: Oropharynx is clear and moist. No oropharyngeal exudate.  Eyes: Conjunctivae and EOM are normal. Pupils are equal, round, and reactive to light. Right eye exhibits no discharge. Left eye exhibits no discharge. No scleral icterus.  The right eye appears to be more open than the left eye.  Neck: Normal range of motion. Neck supple. No thyromegaly present.  Cardiovascular: Normal rate, regular rhythm, normal heart sounds and intact distal pulses.   No murmur heard. Pulmonary/Chest:  Effort normal and breath sounds normal. No respiratory distress. He has no wheezes. He has no rales. He exhibits no tenderness.  Clear anteriorly and posteriorly and no axillary adenopathy and no chest wall masses  Abdominal: Soft. Bowel sounds are normal. He exhibits no mass. There is no tenderness. There is no rebound and no guarding.  Some abdominal gas otherwise no liver or spleen enlargement  no masses and no tenderness  Musculoskeletal: Normal range of motion. He exhibits tenderness. He exhibits no edema.  Lymphadenopathy:    He has no cervical adenopathy.  Neurological: He is alert and oriented to person, place, and time. He has normal reflexes. No cranial nerve deficit.  Skin: Skin is warm and dry. No rash noted.  Psychiatric: He has a normal mood and affect. His behavior is normal. Judgment and thought content normal.  Nursing note and vitals reviewed.  BP 152/80 mmHg  Pulse 89  Temp(Src) 97.4 F (36.3 C) (Oral)  Ht 5\' 8"  (1.727 m)  Wt 167 lb (75.751 kg)  BMI 25.40 kg/m2   Repeat blood pressure 150/80. This was checked in the left arm with the patient sitting.     Assessment & Plan:  1. Hyperlipidemia -Continue current treatment - DG Chest 2 View; Future  2. Vitamin D deficiency -Continue current treatment  3. Hypertension -Blood pressure is elevated today and patient will bring readings by in 4 weeks and compare his monitor to our monitor that time. He will continue to watch his sodium intake and work on keeping his weight down. - DG Chest 2 View; Future  4. Polyneuropathy ([place]) -No complaints with this today.  5. Special screening for malignant neoplasms, colon - Fecal occult blood, imunochemical; Future  6. Double vision with both eyes open -Follow-up with eye doctor as planned  No orders of the defined types were placed in this encounter.   Patient Instructions                       Medicare Annual Wellness Visit  [facility] and the medical providers at [facility] strive to bring you the best medical care.  In doing so we not only want to address your current medical conditions and concerns but also to detect new conditions early and prevent illness, disease and health-related problems.    Medicare offers a yearly Wellness Visit which allows our clinical staff to assess your need for preventative services including  immunizations, lifestyle education, counseling to decrease risk of preventable diseases and screening for fall risk and other medical concerns.    This visit is provided free of charge (no copay) for all Medicare recipients. The clinical pharmacists at [facility] have begun to conduct these Wellness Visits which will also include a thorough review of all your medications.    As you primary medical provider recommend that you make an appointment for your Annual Wellness Visit if you have not done so already this year.  You may set up this appointment before you leave today or you may call back [phone] and schedule an appointment.  Please make sure when you call that you mention that you are scheduling your Annual Wellness Visit with the clinical pharmacist so that the appointment may be made for the proper length of time.     Continue current medications. Continue good therapeutic lifestyle changes which include good diet and exercise. Fall precautions discussed with patient. If an FOBT was given today- please return it  to our front desk. If you are over 45 years old - you may need Prevnar 66 or the adult Pneumonia vaccine.  **Flu shots are available--- please call and schedule a FLU-CLINIC appointment**  After your visit with [place] today you will receive a survey in the mail or online from [employer] regarding your care with [place]. Please take a moment to fill this out. Your feedback is very important to [place] as you can help [place] better understand your patient needs as well as improve your experience and satisfaction. WE CARE ABOUT YOU!!!   The patient should come by in about 4 weeks and bring his blood pressure monitor and his outside blood pressure readings to our office for comparison----if the systolic remains elevated we will have to add more medication to his current meds. If he continues to have double vision and decides that he wants to go see an ophthalmologist he will  call us and we will arrange a visit with the ophthalmologist. He should refrain from using any overhead fans as the overhead fans have a tendency to dry the eyes out more and the respiratory tract out more. He should drink plenty of fluids We will call with the chest x-ray results as soon as those results become available Please return the FOBT   Arrie Senate MD

## 2015-10-12 NOTE — Patient Instructions (Addendum)
Medicare Annual Wellness Visit  [facility] and the medical providers at [facility] strive to bring you the best medical care.  In doing so we not only want to address your current medical conditions and concerns but also to detect new conditions early and prevent illness, disease and health-related problems.    Medicare offers a yearly Wellness Visit which allows our clinical staff to assess your need for preventative services including immunizations, lifestyle education, counseling to decrease risk of preventable diseases and screening for fall risk and other medical concerns.    This visit is provided free of charge (no copay) for all Medicare recipients. The clinical pharmacists at [facility] have begun to conduct these Wellness Visits which will also include a thorough review of all your medications.    As you primary medical provider recommend that you make an appointment for your Annual Wellness Visit if you have not done so already this year.  You may set up this appointment before you leave today or you may call back [phone] and schedule an appointment.  Please make sure when you call that you mention that you are scheduling your Annual Wellness Visit with the clinical pharmacist so that the appointment may be made for the proper length of time.     Continue current medications. Continue good therapeutic lifestyle changes which include good diet and exercise. Fall precautions discussed with patient. If an FOBT was given today- please return it to our front desk. If you are over 54 years old - you may need Prevnar 21 or the adult Pneumonia vaccine.  **Flu shots are available--- please call and schedule a FLU-CLINIC appointment**  After your visit with [place] today you will receive a survey in the mail or online from [employer] regarding your care with [place]. Please take a moment to fill this out. Your feedback is very  important to [place] as you can help [place] better understand your patient needs as well as improve your experience and satisfaction. WE CARE ABOUT YOU!!!   The patient should come by in about 4 weeks and bring his blood pressure monitor and his outside blood pressure readings to our office for comparison----if the systolic remains elevated we will have to add more medication to his current meds. If he continues to have double vision and decides that he wants to go see an ophthalmologist he will call us and we will arrange a visit with the ophthalmologist. He should refrain from using any overhead fans as the overhead fans have a tendency to dry the eyes out more and the respiratory tract out more. He should drink plenty of fluids We will call with the chest x-ray results as soon as those results become available Please return the FOBT

## 2015-11-03 ENCOUNTER — Telehealth: Payer: Self-pay | Admitting: Family Medicine

## 2015-11-03 NOTE — Telephone Encounter (Signed)
Patients wife advised that Dr. Laurance Flatten does not have any opening tomorrow. Appointment was offered with one of our other providers but patient wanted to wait and see Dr. Laurance Flatten on Thursday. Appointment given for Thursday @12 :00pm with Laurance Flatten.

## 2015-11-04 ENCOUNTER — Encounter: Payer: Self-pay | Admitting: Family Medicine

## 2015-11-04 ENCOUNTER — Ambulatory Visit (INDEPENDENT_AMBULATORY_CARE_PROVIDER_SITE_OTHER): Payer: Medicare Other | Admitting: Family Medicine

## 2015-11-04 VITALS — BP 169/71 | HR 81 | Temp 97.0°F | Ht 68.0 in | Wt 169.0 lb

## 2015-11-04 DIAGNOSIS — Z1211 Encounter for screening for malignant neoplasm of colon: Secondary | ICD-10-CM | POA: Diagnosis not present

## 2015-11-04 DIAGNOSIS — H547 Unspecified visual loss: Secondary | ICD-10-CM

## 2015-11-04 DIAGNOSIS — I10 Essential (primary) hypertension: Secondary | ICD-10-CM

## 2015-11-04 DIAGNOSIS — H542 Low vision, both eyes: Secondary | ICD-10-CM | POA: Diagnosis not present

## 2015-11-04 MED ORDER — HYDROCHLOROTHIAZIDE 12.5 MG PO CAPS: 12.5000 mg | ORAL_CAPSULE | Freq: Every day | ORAL | Status: DC

## 2015-11-04 MED ORDER — ROSUVASTATIN CALCIUM 20 MG PO TABS: 20.0000 mg | ORAL_TABLET | Freq: Every day | ORAL | Status: DC

## 2015-11-04 NOTE — Progress Notes (Signed)
Subjective:    Patient ID: Mario Smith, male    DOB: 1941-05-14, 75 y.o.   MRN: [id number]  HPI Pt here for elevated Blood pressure. The patient does complain of some headaches around his eyes. He is currently using some eyedrops per recommendation of his ophthalmologist. He brings in blood pressures from the outside. Most recently these have been more elevated than they were earlier in the month. These readings will be scanned into the record. He is currently seeing the ophthalmologist in [place] but has been referred to an ophthalmologist at [facility]. There is some concern that he may have problems with his thyroid. He does not recall having a thyroid profile recently and we will do one today. A repeat on his blood pressure by me when he first went in the exam room was 168/90. A repeat of the blood pressure with his machine was 182/88 in the same arm sitting.    Patient Active Problem List   Diagnosis Date Noted  . Hyperlipidemia 05/27/2013  . Vitamin D deficiency 05/27/2013  . Hypertension 02/11/2010   Outpatient Encounter Prescriptions as of 11/04/2015  Medication Sig  . aspirin 81 MG EC tablet Take 81 mg by mouth daily.    . Cholecalciferol (VITAMIN D3) 2000 UNITS TABS Take 1 tablet by mouth daily.    . Coenzyme Q10 (CO Q 10 PO) Take 2 capsules by mouth daily.   . fluticasone (FLONASE) 50 MCG/ACT nasal spray Place 2 sprays into both nostrils daily.  Marland Kitchen losartan (COZAAR) 100 MG tablet Take 1 tablet (100 mg total) by mouth daily.  . Omega-3 Fatty Acids (FISH OIL) 1000 MG CAPS Take 1 capsule by mouth 2 (two) times daily.    . rosuvastatin (CRESTOR) 20 MG tablet Take 1 tablet (20 mg total) by mouth daily.  . [DISCONTINUED] rosuvastatin (CRESTOR) 20 MG tablet Take 1 tablet (20 mg total) by mouth daily.  . [DISCONTINUED] nystatin (MYCOSTATIN) 100000 UNIT/ML suspension Take 5 mLs (500,000 Units total) by mouth 4 (four) times daily.   No facility-administered encounter  medications on file as of 11/04/2015.      Review of Systems  Constitutional: Negative.   Eyes: Negative.        Eye swelling - Dr [facility]  Respiratory: Negative.   Cardiovascular: Negative.        Elevated BP  Gastrointestinal: Negative.   Endocrine: Negative.   Genitourinary: Negative.   Musculoskeletal: Negative.   Skin: Negative.   Allergic/Immunologic: Negative.   Neurological: Positive for headaches (behind eyes).  Hematological: Negative.   Psychiatric/Behavioral: Negative.        Objective:   Physical Exam  Constitutional: He is oriented to person, place, and time. He appears well-developed and well-nourished.  HENT:  Head: Normocephalic and atraumatic.  Eyes: Conjunctivae and EOM are normal. Pupils are equal, round, and reactive to light. Left eye exhibits no discharge. No scleral icterus.  The right eye is more dilated than the left.  Neck: Normal range of motion. Neck supple. No thyromegaly present.  Cardiovascular: Normal rate, regular rhythm and normal heart sounds.   No murmur heard. Heart is regular at 72/m  Pulmonary/Chest: Effort normal and breath sounds normal. No respiratory distress. He has no wheezes. He has no rales. He exhibits no tenderness.  Abdominal: Bowel sounds are normal.  Musculoskeletal: Normal range of motion. He exhibits no edema.  Lymphadenopathy:    He has no cervical adenopathy.  Neurological: He is alert and oriented to person, place, and  time.  Skin: Skin is warm and dry. No rash noted.  Psychiatric: He has a normal mood and affect. His behavior is normal. Judgment and thought content normal.  Nursing note and vitals reviewed.  BP 169/71 mmHg  Pulse 81  Temp(Src) 97 F (36.1 C) (Oral)  Ht '5\' 8"'  (1.727 m)  Wt 169 lb (76.658 kg)  BMI 25.70 kg/m2        Assessment & Plan:  1. Special screening for malignant neoplasms, colon - Fecal occult blood, imunochemical - BMP8+EGFR - Thyroid Panel With TSH  2. Hypertension -Continue  current blood pressure medicine and add HCTZ 12.5 mg 1 daily -Recheck blood pressure in 1- 2 weeks and repeat BMP  3. Visual problems -Follow-up with ophthalmology as planned  Meds ordered this encounter  Medications  . rosuvastatin (CRESTOR) 20 MG tablet    Sig: Take 1 tablet (20 mg total) by mouth daily.    Dispense:  90 tablet    Refill:  3  . hydrochlorothiazide (MICROZIDE) 12.5 MG capsule    Sig: Take 1 capsule (12.5 mg total) by mouth daily.    Dispense:  30 capsule    Refill:  2   Patient Instructions  Patient will continue with his low [place] 50 mg twice daily We will add to this HCTZ 12.5 mg once daily in the morning He will follow-up with the ophthalmologist as planned We will call him with the results of the BMP and a thyroid profile as soon as that becomes available and he should take a copy of this with him when he goes to see his ophthalmologist He should continue to watch his sodium intake   Mario Senate MD

## 2015-11-04 NOTE — Patient Instructions (Signed)
Patient will continue with his low Sartin 50 mg twice daily We will add to this HCTZ 12.5 mg once daily in the morning He will follow-up with the ophthalmologist as planned We will call him with the results of the BMP and a thyroid profile as soon as that becomes available and he should take a copy of this with him when he goes to see his ophthalmologist He should continue to watch his sodium intake

## 2015-11-05 ENCOUNTER — Ambulatory Visit: Payer: Medicare Other | Admitting: Family Medicine

## 2015-11-05 LAB — BMP8+EGFR
BUN / CREAT RATIO: 13 (ref 10–24)
BUN: 13 mg/dL (ref 8–27)
CALCIUM: 9.2 mg/dL (ref 8.6–10.2)
CO2: 24 mmol/L (ref 18–29)
Chloride: 105 mmol/L (ref 96–106)
Creatinine, Ser: 1.04 mg/dL (ref 0.76–1.27)
GFR, EST AFRICAN AMERICAN: 81 mL/min/{1.73_m2} (ref >59)
GFR, EST NON AFRICAN AMERICAN: 70 mL/min/{1.73_m2} (ref >59)
Glucose: 87 mg/dL (ref 65–99)
POTASSIUM: 4.4 mmol/L (ref 3.5–5.2)
Sodium: 143 mmol/L (ref 134–144)

## 2015-11-05 LAB — THYROID PANEL WITH TSH
Free Thyroxine Index: 2.8 (ref 1.2–4.9)
T3 UPTAKE RATIO: 29 % (ref 24–39)
T4, Total: 9.7 ug/dL (ref 4.5–12.0)
TSH: 0.017 u[IU]/mL — ABNORMAL LOW (ref 0.450–4.500)

## 2015-11-05 LAB — FECAL OCCULT BLOOD, IMMUNOCHEMICAL: FECAL OCCULT BLD: NEGATIVE

## 2015-11-09 ENCOUNTER — Telehealth: Payer: Self-pay | Admitting: Family Medicine

## 2015-11-09 NOTE — Telephone Encounter (Signed)
Patient aware of results.

## 2015-11-17 DIAGNOSIS — H05242 Constant exophthalmos, left eye: Secondary | ICD-10-CM | POA: Insufficient documentation

## 2015-11-20 ENCOUNTER — Ambulatory Visit: Payer: Medicare Other | Admitting: *Deleted

## 2015-11-20 VITALS — BP 144/68

## 2015-11-20 DIAGNOSIS — Z0130 Encounter for examination of blood pressure without abnormal findings: Secondary | ICD-10-CM

## 2015-11-20 NOTE — Progress Notes (Signed)
Pt came in today for a BP check per DWM. He brought his BP log with him and the log was given to DWM to review.

## 2015-11-25 ENCOUNTER — Encounter: Payer: Self-pay | Admitting: Family Medicine

## 2015-11-25 ENCOUNTER — Encounter (INDEPENDENT_AMBULATORY_CARE_PROVIDER_SITE_OTHER): Payer: Medicare Other | Admitting: Family Medicine

## 2015-11-25 VITALS — BP 132/84 | HR 82 | Temp 97.3°F | Ht 68.0 in | Wt 169.6 lb

## 2015-11-25 DIAGNOSIS — H532 Diplopia: Secondary | ICD-10-CM

## 2015-11-25 DIAGNOSIS — E0500 Thyrotoxicosis with diffuse goiter without thyrotoxic crisis or storm: Secondary | ICD-10-CM

## 2015-11-25 NOTE — Patient Instructions (Signed)
The endocrinologist will be calling the patient to schedule a visit for him to further evaluate his Graves' disease.

## 2015-11-25 NOTE — Progress Notes (Signed)
   Subjective:    Patient ID: TARYLL SZYMBORSKI, male    DOB: [birth year], 75 y.o.   MRN: [id number]  HPI  Patient is here today for a recheck on his thyroid. He currently is not on any thyroid medication.   Review of Systems  Constitutional: Negative.   HENT: Negative.   Eyes: Negative.   Respiratory: Negative.   Cardiovascular: Negative.   Gastrointestinal: Negative.   Endocrine: Negative.   Genitourinary: Negative.   Musculoskeletal: Negative.   Skin: Negative.   Allergic/Immunologic: Negative.   Neurological: Negative.   Hematological: Negative.   Psychiatric/Behavioral: Negative.         Patient Active Problem List   Diagnosis Date Noted  . Hyperlipidemia 05/27/2013  . Vitamin D deficiency 05/27/2013  . Hypertension 02/11/2010   Outpatient Encounter Prescriptions as of 11/25/2015  Medication Sig  . aspirin 81 MG EC tablet Take 81 mg by mouth daily.    . Carboxymethylcellulose Sodium (REFRESH TEARS OP) Apply to eye.  . Cholecalciferol (VITAMIN D3) 2000 UNITS TABS Take 1 tablet by mouth daily.    . Coenzyme Q10 (CO Q 10 PO) Take 2 capsules by mouth daily.   . fluticasone (FLONASE) 50 MCG/ACT nasal spray Place 2 sprays into both nostrils daily.  . hydrochlorothiazide (MICROZIDE) 12.5 MG capsule Take 1 capsule (12.5 mg total) by mouth daily.  Marland Kitchen losartan (COZAAR) 100 MG tablet Take 1 tablet (100 mg total) by mouth daily.  . Omega-3 Fatty Acids (FISH OIL) 1000 MG CAPS Take 1 capsule by mouth 2 (two) times daily.    . rosuvastatin (CRESTOR) 20 MG tablet Take 1 tablet (20 mg total) by mouth daily.  . Selenium 100 MCG TABS Take by mouth.   No facility-administered encounter medications on file as of 11/25/2015.       Objective:   Physical Exam  BP 132/84 mmHg  Pulse 82  Temp(Src) 97.3 F (36.3 C) (Oral)  Ht 5\' 8"  (1.727 m)  Wt 169 lb 9.6 oz (76.93 kg)  BMI 25.79 kg/m2  This was a consult visit. We discussed the findings from the ophthalmologist at [facility] with the patient and that he has Graves' disease which is affecting his vision and causing double vision and a bulging of his eye. We also discussed with the endocrinologist while the patient was here to get a quicker referral to be evaluated by the endocrinologist. I spoke with the endocrinologist regarding the patient's symptoms and findings and he will be calling the patient to set up an appointment with him as soon as possible     Assessment & Plan:  1. Graves disease -Dr. 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 Double vision -Referral made to endocrinology  Patient Instructions  The endocrinologist will be calling the patient to schedule a visit for him to further evaluate his Graves' disease.   Arrie Senate MD

## 2015-11-26 ENCOUNTER — Telehealth: Payer: Self-pay | Admitting: Family Medicine

## 2016-02-07 ENCOUNTER — Other Ambulatory Visit: Payer: Self-pay | Admitting: Family Medicine

## 2016-02-11 ENCOUNTER — Emergency Department (HOSPITAL_COMMUNITY): Payer: Medicare Other

## 2016-02-11 ENCOUNTER — Encounter (HOSPITAL_COMMUNITY): Payer: Self-pay

## 2016-02-11 ENCOUNTER — Ambulatory Visit (INDEPENDENT_AMBULATORY_CARE_PROVIDER_SITE_OTHER): Payer: Medicare Other | Admitting: Family Medicine

## 2016-02-11 ENCOUNTER — Emergency Department (HOSPITAL_COMMUNITY)
Admission: EM | Admit: 2016-02-11 | Discharge: 2016-02-11 | Disposition: A | Discharge status: Home / Self Care | Payer: Medicare Other | Attending: Emergency Medicine | Admitting: Emergency Medicine

## 2016-02-11 VITALS — BP 154/85 | HR 78 | Temp 97.0°F | Ht 68.0 in | Wt 176.4 lb

## 2016-02-11 DIAGNOSIS — Z79899 Other long term (current) drug therapy: Secondary | ICD-10-CM | POA: Insufficient documentation

## 2016-02-11 DIAGNOSIS — Z87891 Personal history of nicotine dependence: Secondary | ICD-10-CM | POA: Diagnosis not present

## 2016-02-11 DIAGNOSIS — R072 Precordial pain: Secondary | ICD-10-CM | POA: Diagnosis not present

## 2016-02-11 DIAGNOSIS — Z7982 Long term (current) use of aspirin: Secondary | ICD-10-CM | POA: Diagnosis not present

## 2016-02-11 DIAGNOSIS — R0789 Other chest pain: Secondary | ICD-10-CM

## 2016-02-11 DIAGNOSIS — I10 Essential (primary) hypertension: Secondary | ICD-10-CM | POA: Diagnosis not present

## 2016-02-11 DIAGNOSIS — R079 Chest pain, unspecified: Secondary | ICD-10-CM

## 2016-02-11 HISTORY — DX: Thyrotoxicosis with diffuse goiter without thyrotoxic crisis or storm: E05.00

## 2016-02-11 LAB — CBC
HEMATOCRIT: 40.1 % (ref 39.0–52.0)
HEMOGLOBIN: 13.4 g/dL (ref 13.0–17.0)
MCH: 35.8 pg — ABNORMAL HIGH (ref 26.0–34.0)
MCHC: 33.4 g/dL (ref 30.0–36.0)
MCV: 107.2 fL — ABNORMAL HIGH (ref 78.0–100.0)
Platelets: 171 10*3/uL (ref 150–400)
RBC: 3.74 MIL/uL — AB (ref 4.22–5.81)
RDW: 15.1 % (ref 11.5–15.5)
WBC: 13 10*3/uL — AB (ref 4.0–10.5)

## 2016-02-11 LAB — BASIC METABOLIC PANEL
ANION GAP: 8 (ref 5–15)
BUN: 19 mg/dL (ref 6–20)
CALCIUM: 9.2 mg/dL (ref 8.9–10.3)
CO2: 28 mmol/L (ref 22–32)
Chloride: 103 mmol/L (ref 101–111)
Creatinine, Ser: 1.23 mg/dL (ref 0.61–1.24)
GFR, EST NON AFRICAN AMERICAN: 55 mL/min — AB (ref >60)
Glucose, Bld: 81 mg/dL (ref 65–99)
POTASSIUM: 3.3 mmol/L — AB (ref 3.5–5.1)
SODIUM: 139 mmol/L (ref 135–145)

## 2016-02-11 LAB — TROPONIN I

## 2016-02-11 NOTE — ED Triage Notes (Signed)
Pt. Came in with wife and reports heaviness in his chest this morning since 10 am. Pt. Reports that pain radiates down back but denies other symptoms. Pt. Reports that he had steroids IV in arm on Tuesday at [facility] for graves disease.

## 2016-02-11 NOTE — Discharge Instructions (Signed)
Test showed no life-threatening condition.  Follow-up with your primary care doctor. °

## 2016-02-11 NOTE — Progress Notes (Signed)
   HPI  Patient presents today with chest pain.  Patient explains chest pain starting around 10 AM this morning, approximately 3 hours ago. He describes sharp shooting pain from his lower central chest that radiates to his back "with each heartbeat". He denies any exertional symptoms. He states the pain has "eased off for now."  He denies any shortness of breath, palpitations, sweating, or other associated symptoms.  He has history of well-controlled hypertension and hyperlipidemia.  He is not a smoker, he stopped 30 years ago.  He received IV steroids a few days ago for Graves' disease.  PMH: Smoking status noted ROS: Per HPI  Objective: BP (!) 154/85 (BP Location: Left Arm, Patient Position: Sitting, Cuff Size: Normal)   Pulse 78   Temp 97 F (36.1 C) (Oral)   Ht 5\' 8"  (1.727 m)   Wt 176 lb 6.4 oz (80 kg)   SpO2 97%   BMI 26.82 kg/m  Gen: NAD, alert, cooperative with exam HEENT: NCAT CV: RRR, good S1/S2, no murmur Resp: CTABL, no wheezes, non-labored Ext: No edema, warm Neuro: Alert and oriented, No gross deficits  EKG: Normal sinus rhythm  Assessment and plan:  # Chest pain Atypical chest pain, unlikely to be ACS. However given age and positive risk factors of hypertension and hyperlipidemia advised him to go to the emergency room for likely cardiac enzymes to rule out ACS completely. Patient understands and has a ride, they will taken by car right away.   Appreciate Ed's evaluation and treatment.  Orders Placed This Encounter  Procedures  . EKG 12-Lead    Meds ordered this encounter  Medications  . methimazole (TAPAZOLE) 10 MG tablet    Sig: Take 10 mg by mouth daily.    Laroy Apple, MD [facility] 02/11/2016, 1:49 PM

## 2016-02-11 NOTE — ED Provider Notes (Signed)
[facility] DEPT Provider Note   CSN: [id number] Arrival date & time: 02/11/16  1428     History   Chief Complaint Chief Complaint  Patient presents with  . Chest Pain    HPI Mario Smith is a 75 y.o. male.  Lower sternal chest pain radiating to lower back earlier today. Symptoms are now gone. No dyspnea, diaphoresis, nausea. No previous heart history. Patient is treated at [facility] for [facility]' disease with high-dose steroids. Chest x-ray was obtained there today. Severity of symptoms mild to moderate. Nothing makes symptoms better or worse.      Past Medical History:  Diagnosis Date  . Back pain   . Colon polyp   . ED (erectile dysfunction)   . Essential hypertension, benign   . Graves disease   . Other and unspecified hyperlipidemia   . PONV (postoperative nausea and vomiting)   . Thrombocytopenia [facility])     Patient Active Problem List   Diagnosis Date Noted  . Hyperlipidemia 05/27/2013  . Vitamin D deficiency 05/27/2013  . Hypertension 02/11/2010    Past Surgical History:  Procedure Laterality Date  . CATARACT EXTRACTION    . COLONOSCOPY    . SPINAL FUSION  2001   cervical  . TENDON REPAIR Right 05/16/2014   Procedure: RIGHT RING FINGER FLEXOR TENDON REPAIR WITH PULL OUT BUTTON;  Surgeon: Roseanne Kaufman, MD;  Location: [facility];  Service: Orthopedics;  Laterality: Right;       Home Medications    Prior to Admission medications   Medication Sig Start Date End Date Taking? Authorizing Provider  aspirin 81 MG EC tablet Take 81 mg by mouth daily.     Yes Historical Provider, MD  Carboxymethylcellulose Sodium (REFRESH TEARS OP) Apply 1 drop to eye daily as needed (dry eyes).    Yes Historical Provider, MD  Cholecalciferol (VITAMIN D3) 2000 UNITS TABS Take 1 tablet by mouth daily.     Yes Historical Provider, MD  Coenzyme Q10 (CO Q 10 PO) Take 2 capsules by mouth daily.    Yes Historical Provider, MD  fluticasone (FLONASE) 50  MCG/ACT nasal spray Place 2 sprays into both nostrils daily. 06/11/15  Yes Chipper Herb, MD  hydrochlorothiazide (MICROZIDE) 12.5 MG capsule TAKE ONE CAPSULE BY MOUTH ONCE DAILY 02/09/16  Yes Chipper Herb, MD  losartan (COZAAR) 100 MG tablet Take 1 tablet (100 mg total) by mouth daily. 09/15/15  Yes Chipper Herb, MD  methimazole (TAPAZOLE) 10 MG tablet Take 10 mg by mouth daily. 12/03/15  Yes Historical Provider, MD  Omega-3 Fatty Acids (FISH OIL) 1000 MG CAPS Take 1 capsule by mouth 2 (two) times daily.     Yes Historical Provider, MD  rosuvastatin (CRESTOR) 20 MG tablet Take 1 tablet (20 mg total) by mouth daily. 11/04/15  Yes Chipper Herb, MD  Selenium 100 MCG TABS Take 1 tablet by mouth daily.    Yes Historical Provider, MD    Family History Family History  Problem Relation Age of Onset  . Heart disease Mother   . COPD Mother   . Heart disease Father   . COPD Father   . Cancer Sister     colon cancer  . Emphysema Sister   . Cancer Brother     prostate  . Emphysema Sister     Social History Social History  Substance Use Topics  . Smoking status: Former Smoker    Types: Cigarettes    Quit date: 12/19/1992  .  Smokeless tobacco: Never Used  . Alcohol use No     Allergies   Codeine; Doxycycline; Hydrocodone; Niaspan [niacin er]; Simvastatin; and Sulfa antibiotics   Review of Systems Review of Systems  All other systems reviewed and are negative.    Physical Exam Updated Vital Signs BP 137/68   Pulse 72   Temp 97.5 F (36.4 C) (Oral)   Resp 18   Ht 5\' 6"  (1.676 m)   Wt 164 lb (74.4 kg)   SpO2 96%   BMI 26.47 kg/m   Physical Exam  Constitutional: He is oriented to person, place, and time. He appears well-developed and well-nourished.  HENT:  Head: Normocephalic and atraumatic.  Eyes: Conjunctivae are normal.  Neck: Neck supple.  Cardiovascular: Normal rate and regular rhythm.   Pulmonary/Chest: Effort normal and breath sounds normal.  Abdominal: Soft.  Bowel sounds are normal.  Musculoskeletal: Normal range of motion.  Neurological: He is alert and oriented to person, place, and time.  Skin: Skin is warm and dry.  Psychiatric: He has a normal mood and affect. His behavior is normal.  Nursing note and vitals reviewed.    ED Treatments / Results  Labs (all labs ordered are listed, but only abnormal results are displayed) Labs Reviewed  BASIC METABOLIC PANEL - Abnormal; Notable for the following:       Result Value   Potassium 3.3 (*)    GFR calc non Af Amer 55 (*)    All other components within normal limits  CBC - Abnormal; Notable for the following:    WBC 13.0 (*)    RBC 3.74 (*)    MCV 107.2 (*)    MCH 35.8 (*)    All other components within normal limits  TROPONIN I    EKG  EKG Interpretation None       Date: 02/11/2016  Rate: 76  Rhythm: normal sinus rhythm  QRS Axis: normal  Intervals: normal  ST/T Wave abnormalities: normal  Conduction Disutrbances: none  Narrative Interpretation: unremarkable    Radiology No results found.  Procedures Procedures (including critical care time)  Medications Ordered in ED Medications - No data to display   Initial Impression / Assessment and Plan / ED Course  I have reviewed the triage vital signs and the nursing notes.  Pertinent labs & imaging results that were available during my care of the patient were reviewed by me and considered in my medical decision making (see chart for details).  Clinical Course    Patient is in no acute distress. His chest pain has disappeared. Screening labs, troponin, EKG all negative. I personally called [facility] for a formal chest x-ray report which was read as negative.  Final Clinical Impressions(s) / ED Diagnoses   Final diagnoses:  Chest pain, unspecified chest pain type    New Prescriptions New Prescriptions   No medications on file     Nat Christen, MD 02/11/16 1750

## 2016-02-11 NOTE — ED Notes (Signed)
EKG completed

## 2016-02-17 ENCOUNTER — Other Ambulatory Visit: Payer: Medicare Other

## 2016-02-17 DIAGNOSIS — E785 Hyperlipidemia, unspecified: Secondary | ICD-10-CM

## 2016-02-17 DIAGNOSIS — I10 Essential (primary) hypertension: Secondary | ICD-10-CM

## 2016-02-17 DIAGNOSIS — E559 Vitamin D deficiency, unspecified: Secondary | ICD-10-CM

## 2016-02-17 DIAGNOSIS — R972 Elevated prostate specific antigen [PSA]: Secondary | ICD-10-CM

## 2016-02-18 LAB — CBC WITH DIFFERENTIAL/PLATELET
BASOS ABS: 0 10*3/uL (ref 0.0–0.2)
BASOS: 1 %
EOS (ABSOLUTE): 0.2 10*3/uL (ref 0.0–0.4)
Eos: 4 %
HEMOGLOBIN: 15.1 g/dL (ref 12.6–17.7)
Hematocrit: 42.6 % (ref 37.5–51.0)
Immature Grans (Abs): 0 10*3/uL (ref 0.0–0.1)
Immature Granulocytes: 0 %
LYMPHS ABS: 1.8 10*3/uL (ref 0.7–3.1)
Lymphs: 35 %
MCH: 36.8 pg — AB (ref 26.6–33.0)
MCHC: 35.4 g/dL (ref 31.5–35.7)
MCV: 104 fL — AB (ref 79–97)
Monocytes Absolute: 0.7 10*3/uL (ref 0.1–0.9)
Monocytes: 13 %
NEUTROS ABS: 2.3 10*3/uL (ref 1.4–7.0)
Neutrophils: 47 %
PLATELETS: 170 10*3/uL (ref 150–379)
RBC: 4.1 x10E6/uL — ABNORMAL LOW (ref 4.14–5.80)
RDW: 14.7 % (ref 12.3–15.4)
WBC: 5 10*3/uL (ref 3.4–10.8)

## 2016-02-18 LAB — PSA, TOTAL AND FREE
PROSTATE SPECIFIC AG, SERUM: 3.3 ng/mL (ref 0.0–4.0)
PSA FREE PCT: 13.3 %
PSA FREE: 0.44 ng/mL

## 2016-02-18 LAB — BMP8+EGFR
BUN / CREAT RATIO: 9 — AB (ref 10–24)
BUN: 11 mg/dL (ref 8–27)
CHLORIDE: 101 mmol/L (ref 96–106)
CO2: 26 mmol/L (ref 18–29)
Calcium: 9.4 mg/dL (ref 8.6–10.2)
Creatinine, Ser: 1.17 mg/dL (ref 0.76–1.27)
GFR calc non Af Amer: 60 mL/min/{1.73_m2} (ref >59)
GFR, EST AFRICAN AMERICAN: 70 mL/min/{1.73_m2} (ref >59)
Glucose: 98 mg/dL (ref 65–99)
POTASSIUM: 4.5 mmol/L (ref 3.5–5.2)
Sodium: 142 mmol/L (ref 134–144)

## 2016-02-18 LAB — HEPATIC FUNCTION PANEL
ALK PHOS: 92 IU/L (ref 39–117)
ALT: 22 IU/L (ref 0–44)
AST: 23 IU/L (ref 0–40)
Albumin: 4.2 g/dL (ref 3.5–4.8)
Bilirubin Total: 0.6 mg/dL (ref 0.0–1.2)
Bilirubin, Direct: 0.18 mg/dL (ref 0.00–0.40)
TOTAL PROTEIN: 6.9 g/dL (ref 6.0–8.5)

## 2016-02-18 LAB — LIPID PANEL
CHOLESTEROL TOTAL: 140 mg/dL (ref 100–199)
Chol/HDL Ratio: 3 ratio units (ref 0.0–5.0)
HDL: 46 mg/dL (ref >39)
LDL Calculated: 77 mg/dL (ref 0–99)
Triglycerides: 83 mg/dL (ref 0–149)
VLDL CHOLESTEROL CAL: 17 mg/dL (ref 5–40)

## 2016-02-18 LAB — VITAMIN D 25 HYDROXY (VIT D DEFICIENCY, FRACTURES): VIT D 25 HYDROXY: 48.8 ng/mL (ref 30.0–100.0)

## 2016-02-19 DIAGNOSIS — Z227 Latent tuberculosis: Secondary | ICD-10-CM | POA: Insufficient documentation

## 2016-02-19 LAB — THYROID PANEL WITH TSH
Free Thyroxine Index: 1.6 (ref 1.2–4.9)
T3 UPTAKE RATIO: 24 % (ref 24–39)
T4 TOTAL: 6.6 ug/dL (ref 4.5–12.0)
TSH: 18.04 u[IU]/mL — AB (ref 0.450–4.500)

## 2016-02-19 LAB — SPECIMEN STATUS REPORT

## 2016-02-24 ENCOUNTER — Ambulatory Visit (INDEPENDENT_AMBULATORY_CARE_PROVIDER_SITE_OTHER): Payer: Medicare Other

## 2016-02-24 ENCOUNTER — Encounter: Payer: Self-pay | Admitting: Family Medicine

## 2016-02-24 ENCOUNTER — Ambulatory Visit (INDEPENDENT_AMBULATORY_CARE_PROVIDER_SITE_OTHER): Payer: Medicare Other | Admitting: Family Medicine

## 2016-02-24 VITALS — BP 134/85 | HR 96 | Temp 97.4°F | Ht 66.0 in | Wt 170.0 lb

## 2016-02-24 DIAGNOSIS — E0590 Thyrotoxicosis, unspecified without thyrotoxic crisis or storm: Secondary | ICD-10-CM | POA: Insufficient documentation

## 2016-02-24 DIAGNOSIS — I10 Essential (primary) hypertension: Secondary | ICD-10-CM

## 2016-02-24 DIAGNOSIS — R300 Dysuria: Secondary | ICD-10-CM | POA: Diagnosis not present

## 2016-02-24 DIAGNOSIS — R972 Elevated prostate specific antigen [PSA]: Secondary | ICD-10-CM

## 2016-02-24 DIAGNOSIS — E0500 Thyrotoxicosis with diffuse goiter without thyrotoxic crisis or storm: Secondary | ICD-10-CM | POA: Diagnosis not present

## 2016-02-24 DIAGNOSIS — E785 Hyperlipidemia, unspecified: Secondary | ICD-10-CM | POA: Diagnosis not present

## 2016-02-24 DIAGNOSIS — E559 Vitamin D deficiency, unspecified: Secondary | ICD-10-CM

## 2016-02-24 LAB — URINALYSIS, COMPLETE
BILIRUBIN UA: NEGATIVE
GLUCOSE, UA: NEGATIVE
KETONES UA: NEGATIVE
Nitrite, UA: NEGATIVE
Protein, UA: NEGATIVE
SPEC GRAV UA: 1.02 (ref 1.005–1.030)
Urobilinogen, Ur: 0.2 mg/dL (ref 0.2–1.0)
pH, UA: 5.5 (ref 5.0–7.5)

## 2016-02-24 LAB — MICROSCOPIC EXAMINATION
Bacteria, UA: NONE SEEN
Epithelial Cells (non renal): NONE SEEN /hpf (ref 0–10)

## 2016-02-24 NOTE — Progress Notes (Signed)
Subjective:    Patient ID: Mario Smith, male    DOB: [birth year], 75 y.o.   MRN: [id number]  HPI Pt here for follow up and management of chronic medical problems which includes hypertension and hyperlipidemia. He is taking medications regularly.The patient brings in blood pressure readings for review and all of these are reasonable and within normal limits. The most significant finding with this patient is that he has been treated by his endocrinologist for hyperthyroidism. He has had recent lab work and his TSH is now up to 18 and may actually need for thyroid replacement now. We will discuss this with his endocrinologist. Also he had his PSA drawn and the rate of rise is 1.3 and this is greater than 0.75 per year. Previously one year ago the PSA was 2.0 and now is 3.3 we will review this with him today. The blood sugar is good at 98 and renal function is good with a good creatinine. Electrolytes are within normal limits. The CBC has a hemoglobin which is good at 15.1 and a normal white blood cell count and an adequate platelet count. All liver function tests are within normal limits. Vitamin D level is good at 48.8 and cholesterol numbers with traditional Testing are good and within normal limits. The patient has Graves' disease and is being followed by the endocrinologist at [facility]. It is also affected his vision and he seen an ophthalmologist there too. 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 He denies any chest pain but recently did have some and was worked up thoroughly for this and everything was good. He denies any shortness of breath trouble with his GI tract nausea vomiting blood in the stool or black tarry bowel movements. He is passing his water okay except he does have some slight burning with voiding. Explain the rise in the PSA and we will repeat the PSA test again in 4 weeks and will get a urine and urine  culture and sensitivity today to follow-up on the slight burning.    Patient Active Problem List   Diagnosis Date Noted  . Hyperlipidemia 05/27/2013  . Vitamin D deficiency 05/27/2013  . Hypertension 02/11/2010   Outpatient Encounter Prescriptions as of 02/24/2016  Medication Sig  . aspirin 81 MG EC tablet Take 81 mg by mouth daily.    . Carboxymethylcellulose Sodium (REFRESH TEARS OP) Apply 1 drop to eye daily as needed (dry eyes).   . Cholecalciferol (VITAMIN D3) 2000 UNITS TABS Take 1 tablet by mouth daily.    . Coenzyme Q10 (CO Q 10 PO) Take 2 capsules by mouth daily.   . fluticasone (FLONASE) 50 MCG/ACT nasal spray Place 2 sprays into both nostrils daily.  . hydrochlorothiazide (MICROZIDE) 12.5 MG capsule TAKE ONE CAPSULE BY MOUTH ONCE DAILY  . isoniazid (NYDRAZID) 300 MG tablet Take 300 mg by mouth daily.  Marland Kitchen losartan (COZAAR) 100 MG tablet Take 1 tablet (100 mg total) by mouth daily.  . methimazole (TAPAZOLE) 10 MG tablet Take 10 mg by mouth daily.  . Omega-3 Fatty Acids (FISH OIL) 1000 MG CAPS Take 1 capsule by mouth 2 (two) times daily.    . rosuvastatin (CRESTOR) 20 MG tablet Take 1 tablet (20 mg total) by mouth daily.  . Selenium 100 MCG TABS Take 1 tablet by mouth daily.   Marland Kitchen UNABLE TO FIND Med Name: VIT B-6 - 25 mg - one a day  No facility-administered encounter medications on file as of 02/24/2016.       Review of Systems  Constitutional: Negative.   HENT: Negative.   Eyes: Negative.   Respiratory: Negative.   Cardiovascular: Negative.   Gastrointestinal: Negative.   Endocrine: Negative.   Genitourinary: Negative.   Musculoskeletal: Negative.   Skin: Negative.   Allergic/Immunologic: Negative.   Neurological: Negative.   Hematological: Negative.   Psychiatric/Behavioral: Negative.        Objective:   Physical Exam  Constitutional: He is oriented to person, place, and time. He appears well-developed and well-nourished. No distress.  The patient is pleasant  and alert and having problems with his left eye because of swelling.  HENT:  Head: Normocephalic and atraumatic.  Right Ear: External ear normal.  Left Ear: External ear normal.  Nose: Nose normal.  Mouth/Throat: Oropharynx is clear and moist. No oropharyngeal exudate.  Eyes: Conjunctivae and EOM are normal. Pupils are equal, round, and reactive to light. Right eye exhibits no discharge. Left eye exhibits no discharge. No scleral icterus.  There is slight periOrbital swelling of the left eye requiring the patient to blink a lot.  Neck: Normal range of motion. Neck supple. No thyromegaly present.  No adenopathy and no bruits  Cardiovascular: Normal rate, regular rhythm, normal heart sounds and intact distal pulses.   No murmur heard. Heart is regular at 84/m  Pulmonary/Chest: Effort normal and breath sounds normal. No respiratory distress. He has no wheezes. He has no rales. He exhibits no tenderness.  Clear anteriorly and posteriorly  Abdominal: Soft. Bowel sounds are normal. He exhibits no mass. There is no tenderness. There is no rebound and no guarding.  No liver or spleen enlargement no abdominal masses no suprapubic tenderness and no inguinal adenopathy  Genitourinary: Rectum normal and penis normal.  Genitourinary Comments: The prostate is slightly enlarged but smooth in nature. There is no rectal masses. There were no inguinal hernias palpable and the external genitalia were within normal limits.  Musculoskeletal: Normal range of motion. He exhibits no edema.  Lymphadenopathy:    He has no cervical adenopathy.  Neurological: He is alert and oriented to person, place, and time. He has normal reflexes. No cranial nerve deficit.  Skin: Skin is warm and dry. No rash noted.  Psychiatric: He has a normal mood and affect. His behavior is normal. Judgment and thought content normal.  Nursing note and vitals reviewed.   BP 134/85 (BP Location: Left Arm)   Pulse 96   Temp 97.4 F (36.3  C) (Oral)   Ht 5\' 6"  (1.676 m)   Wt 170 lb (77.1 kg)   BMI 27.44 kg/m   [facility] reading (PRIMARY) by  Dr. Vida Rigger x-ray with results pending                                       Assessment & Plan:  1. Hypertension -The blood pressure is good today and will continue with current treatment - DG Chest 2 View; Future  2. Hyperlipidemia -All cholesterol numbers were good and he will continue with current treatment and therapeutic lifestyle changes - DG Chest 2 View; Future  3. Vitamin D deficiency -The vitamin D level was good and he will continue with current treatment  4. Elevated PSA -The PSA is actually within normal limits. The rate of rise is greater than 0.75 and the patient does complain of some  burning with voiding. We will check a urinalysis and do a urine culture and sensitivity and we'll plan to repeat the PSA in 4 weeks - Urinalysis, Complete  5. Hyperthyroidism -Continue to follow-up with endocrinology and ophthalmology  6. Graves disease -Continue follow-up with endocrinology  7. Dysuria -Check urinalysis and do urine culture and sensitivity  Patient Instructions                       Medicare Annual Wellness Visit  [facility] and the medical providers at [facility] strive to bring you the best medical care.  In doing so we not only want to address your current medical conditions and concerns but also to detect new conditions early and prevent illness, disease and health-related problems.    Medicare offers a yearly Wellness Visit which allows our clinical staff to assess your need for preventative services including immunizations, lifestyle education, counseling to decrease risk of preventable diseases and screening for fall risk and other medical concerns.    This visit is provided free of charge (no copay) for all Medicare recipients. The clinical pharmacists at [facility] have begun to conduct these Wellness  Visits which will also include a thorough review of all your medications.    As you primary medical provider recommend that you make an appointment for your Annual Wellness Visit if you have not done so already this year.  You may set up this appointment before you leave today or you may call back [id number] and schedule an appointment.  Please make sure when you call that you mention that you are scheduling your Annual Wellness Visit with the clinical pharmacist so that the appointment may be made for the proper length of time.     Continue current medications. Continue good therapeutic lifestyle changes which include good diet and exercise. Fall precautions discussed with patient. If an FOBT was given today- please return it to our front desk. If you are over 50 years old - you may need Prevnar 56 or the adult Pneumonia vaccine.  **Flu shots are available--- please call and schedule a FLU-CLINIC appointment**  After your visit with [place] today you will receive a survey in the mail or online from [employer] regarding your care with [place]. Please take a moment to fill this out. Your feedback is very important to [place] as you can help [place] better understand your patient needs as well as improve your experience and satisfaction. WE CARE ABOUT YOU!!!   Continue to follow-up with endocrinology, ophthalmology and infectious disease Repeat PSA in 4 weeks We will wait for urinalysis and urine culture and sensitivity to be return before we start any antibiotics.    Arrie Senate MD

## 2016-02-24 NOTE — Patient Instructions (Addendum)
Medicare Annual Wellness Visit  [facility] [place] and the medical providers at [facility] strive to bring you the best medical care.  In doing so we not only want to address your current medical conditions and concerns but also to detect new conditions early and prevent illness, disease and health-related problems.    Medicare offers a yearly Wellness Visit which allows our clinical staff to assess your need for preventative services including immunizations, lifestyle education, counseling to decrease risk of preventable diseases and screening for fall risk and other medical concerns.    This visit is provided free of charge (no copay) for all Medicare recipients. The clinical pharmacists at [facility] have begun to conduct these Wellness Visits which will also include a thorough review of all your medications.    As you primary medical provider recommend that you make an appointment for your Annual Wellness Visit if you have not done so already this year.  You may set up this appointment before you leave today or you may call back [phone] and schedule an appointment.  Please make sure when you call that you mention that you are scheduling your Annual Wellness Visit with the clinical pharmacist so that the appointment may be made for the proper length of time.     Continue current medications. Continue good therapeutic lifestyle changes which include good diet and exercise. Fall precautions discussed with patient. If an FOBT was given today- please return it to our front desk. If you are over 64 years old - you may need Prevnar 61 or the adult Pneumonia vaccine.  **Flu shots are available--- please call and schedule a FLU-CLINIC appointment**  After your visit with [place] today you will receive a survey in the mail or online from [employer] regarding your care with [place]. Please take a moment to fill this out. Your feedback is very  important to [place] as you can help [place] better understand your patient needs as well as improve your experience and satisfaction. WE CARE ABOUT YOU!!!   Continue to follow-up with endocrinology, ophthalmology and infectious disease Repeat PSA in 4 weeks We will wait for urinalysis and urine culture and sensitivity to be return before we start any antibiotics.

## 2016-02-24 NOTE — Addendum Note (Signed)
Addended by: Zannie Cove on: 02/24/2016 03:44 PM   Modules accepted: Orders

## 2016-02-25 LAB — URINE CULTURE: Organism ID, Bacteria: NO GROWTH

## 2016-02-26 ENCOUNTER — Telehealth: Payer: Self-pay | Admitting: Family Medicine

## 2016-02-26 ENCOUNTER — Other Ambulatory Visit: Payer: Self-pay | Admitting: *Deleted

## 2016-02-26 ENCOUNTER — Other Ambulatory Visit: Payer: Self-pay | Admitting: Family Medicine

## 2016-02-26 MED ORDER — DOXYCYCLINE HYCLATE 100 MG PO TABS: 100.0000 mg | ORAL_TABLET | Freq: Two times a day (BID) | ORAL | 0 refills | Status: DC

## 2016-02-26 NOTE — Telephone Encounter (Signed)
Xray re- reviewed

## 2016-02-26 NOTE — Telephone Encounter (Signed)
Patient aware of results and medication sent to pharmacy

## 2016-02-26 NOTE — Telephone Encounter (Signed)
Rx sent to pharmacy in another encounter 

## 2016-04-07 ENCOUNTER — Other Ambulatory Visit (INDEPENDENT_AMBULATORY_CARE_PROVIDER_SITE_OTHER): Payer: Medicare Other

## 2016-04-07 ENCOUNTER — Other Ambulatory Visit: Payer: Self-pay | Admitting: Family Medicine

## 2016-04-07 DIAGNOSIS — R972 Elevated prostate specific antigen [PSA]: Secondary | ICD-10-CM

## 2016-04-08 LAB — PSA, TOTAL AND FREE
PROSTATE SPECIFIC AG, SERUM: 2.4 ng/mL (ref 0.0–4.0)
PSA FREE PCT: 11.3 %
PSA, Free: 0.27 ng/mL

## 2016-04-19 ENCOUNTER — Ambulatory Visit (INDEPENDENT_AMBULATORY_CARE_PROVIDER_SITE_OTHER): Payer: Medicare Other | Admitting: *Deleted

## 2016-04-19 VITALS — BP 139/86 | HR 83 | Ht 66.0 in | Wt 175.0 lb

## 2016-04-19 DIAGNOSIS — Z0000 Encounter for general adult medical examination without abnormal findings: Secondary | ICD-10-CM

## 2016-04-19 DIAGNOSIS — Z23 Encounter for immunization: Secondary | ICD-10-CM

## 2016-04-19 DIAGNOSIS — E0590 Thyrotoxicosis, unspecified without thyrotoxic crisis or storm: Secondary | ICD-10-CM

## 2016-04-19 NOTE — Patient Instructions (Signed)
  Mario Smith ,  Thank you for taking time to come for your Medicare Wellness Visit. I appreciate your ongoing commitment to your health goals. Please review the following plan we discussed and let me know if I can assist you in the future.   These are the goals we discussed: Goals    . Exercise 3x per week (30 min per time)          Walk for 30 minutes 3 times a week as tolerated. May need to build up to 30 minutes slowly.       This is a list of the screening recommended for you and due dates:  Health Maintenance  Topic Date Due  . Flu Shot  01/05/2016  . Tetanus Vaccine  03/06/2021  . Shingles Vaccine  Completed  . Pneumonia vaccines  Completed

## 2016-04-19 NOTE — Progress Notes (Signed)
Subjective:   Mario Smith is a 75 y.o. male who presents for an Initial Medicare Annual Wellness Visit. Mario Smith lives at home with his wife and dog. He is retired from [employer] and enjoys fishing and bow hunting He has one daughter, 3 grandchildren, and great grandchildren as well. He is involved in his church.   Review of Systems   Cardiac Risk Factors include: dyslipidemia;male gender;hypertension;advanced age (>9men, >61 women);sedentary lifestyle  Endocrine: Graves Disease-under treatment by Dr Nicoletta Dress at [facility]: double vision-under treatment by Dr Rosendo Gros with [facility]  Other systems negative  Objective:    Today's Vitals   04/19/16 1441  BP: 139/86  Pulse: 83  Weight: 175 lb (79.4 kg)  Height: 5\' 6"  (1.676 m)   Body mass index is 28.25 kg/m.  Current Medications (verified) Outpatient Encounter Prescriptions as of 04/19/2016  Medication Sig  . aspirin 81 MG EC tablet Take 81 mg by mouth daily.    . Carboxymethylcellulose Sodium (REFRESH TEARS OP) Apply 1 drop to eye daily as needed (dry eyes).   . Cholecalciferol (VITAMIN D3) 2000 UNITS TABS Take 1 tablet by mouth daily.    . Coenzyme Q10 (CO Q 10 PO) Take 2 capsules by mouth daily.   Marland Kitchen doxycycline (VIBRA-TABS) 100 MG tablet Take 1 tablet (100 mg total) by mouth 2 (two) times daily.  . fluticasone (FLONASE) 50 MCG/ACT nasal spray Place 2 sprays into both nostrils daily.  . hydrochlorothiazide (MICROZIDE) 12.5 MG capsule TAKE ONE CAPSULE BY MOUTH ONCE DAILY  . isoniazid (NYDRAZID) 300 MG tablet Take 300 mg by mouth daily.  Marland Kitchen losartan (COZAAR) 100 MG tablet TAKE ONE TABLET BY MOUTH ONCE DAILY  . methimazole (TAPAZOLE) 10 MG tablet Take 10 mg by mouth daily.  . Omega-3 Fatty Acids (FISH OIL) 1000 MG CAPS Take 1 capsule by mouth 2 (two) times daily.    . rosuvastatin (CRESTOR) 20 MG tablet Take 1 tablet (20 mg total) by mouth daily.  . Selenium 100 MCG TABS Take 1 tablet by mouth daily.   Marland Kitchen UNABLE TO FIND Med  Name: VIT B-6 - 25 mg - one a day   No facility-administered encounter medications on file as of 04/19/2016.     Allergies (verified) Codeine; Hydrocodone; Niaspan [niacin er]; Simvastatin; and Sulfa antibiotics   History: Past Medical History:  Diagnosis Date  . Back pain   . Colon polyp   . ED (erectile dysfunction)   . Essential hypertension, benign   . Graves disease   . Other and unspecified hyperlipidemia   . PONV (postoperative nausea and vomiting)   . Thrombocytopenia ([place])    Past Surgical History:  Procedure Laterality Date  . CATARACT EXTRACTION    . COLONOSCOPY    . SPINAL FUSION  2001   cervical  . TENDON REPAIR Right 05/16/2014   Procedure: RIGHT RING FINGER FLEXOR TENDON REPAIR WITH PULL OUT BUTTON;  Surgeon: Roseanne Kaufman, MD;  Location: [facility];  Service: Orthopedics;  Laterality: Right;   Family History  Problem Relation Age of Onset  . Heart disease Mother   . COPD Mother   . Heart disease Father   . COPD Father   . Alcohol abuse Father   . Cancer Sister     colon cancer  . Emphysema Sister   . Cancer Brother     prostate  . Emphysema Sister   . Prostate cancer Brother   . Healthy Daughter    Social  History   Occupational History  . Not on file.   Social History Main Topics  . Smoking status: Former Smoker    Types: Cigarettes    Quit date: 12/19/1992  . Smokeless tobacco: Never Used  . Alcohol use No  . Drug use: No  . Sexual activity: Not Currently   Tobacco Counseling No tobacco use  Activities of Daily Living In your present state of health, do you have any difficulty performing the following activities: 04/19/2016  Hearing? Y  Vision? Y  Difficulty concentrating or making decisions? N  Walking or climbing stairs? N  Dressing or bathing? N  Doing errands, shopping? N  Preparing Food and eating ? N  Using the Toilet? N  In the past six months, have you accidently leaked urine? N  Do you have problems with  loss of bowel control? N  Managing your Medications? N  Managing your Finances? N  Housekeeping or managing your Housekeeping? N  Some recent data might be hidden    Immunizations and Health Maintenance Immunization History  Administered Date(s) Administered  . Influenza Whole 11/04/2009  . Influenza, High Dose Seasonal PF 04/02/2015, 04/19/2016  . Influenza,inj,Quad PF,36+ Mos 03/27/2013, 04/16/2014  . Pneumococcal Conjugate-13 05/27/2013  . Pneumococcal Polysaccharide-23 01/12/2007  . Td 12/08/2002  . Zoster 09/05/2006   Health Maintenance Due  Topic Date Due  . INFLUENZA VACCINE  01/05/2016    Patient Care Team: Chipper Herb, MD as PCP - General (Family Medicine) Inda Castle, MD as Attending Physician (Gastroenterology) K Dorian Heckle, MD as Referring Physician (Endocrinology) Arta Bruce, OD as Referring Physician (Ophthalmology)   Assessment:   This is a routine wellness examination for Mario Smith.   Hearing/Vision screen Some hearing loss noted during visit  Dietary issues and exercise activities discussed: Current Exercise Habits: The patient does not participate in regular exercise at present, Exercise limited by: Other - see comments (Weak from treatment for Graves disease)  Goals    . Exercise 3x per week (30 min per time)          Walk for 30 minutes 3 times a week as tolerated. May need to build up to 30 minutes slowly.      Reports eating 3 meals a day and some snacks. Mainly eats home prepared meals.  Depression Screen PHQ 2/9 Scores 04/19/2016 02/24/2016 02/11/2016 11/25/2015  PHQ - 2 Score 0 0 0 0    Fall Risk Fall Risk  04/19/2016 02/24/2016 02/11/2016 11/25/2015 11/04/2015  Falls in the past year? No No No No No  Number falls in past yr: - - - - -  Injury with Fall? - - - - -  Risk for fall due to : - - - - -    Cognitive Function: MMSE - Mini Mental State Exam 04/19/2016  Orientation to time 5  Orientation to Place 5  Registration 3    Attention/ Calculation 4  Recall 3  Language- name 2 objects 2  Language- repeat 1  Language- follow 3 step command 3  Language- read & follow direction 1  Write a sentence 1  Copy design 1  Total score 29        Screening Tests Health Maintenance  Topic Date Due  . INFLUENZA VACCINE  01/05/2016  . TETANUS/TDAP  03/06/2021  . ZOSTAVAX  Completed  . PNA vac Low Risk Adult  Completed        Plan:  Keep f/u with Dr Laurance Flatten Jan 2018.  During the course of the visit Corrigan was educated and counseled about the following appropriate screening and preventive services:   Vaccines to include Pneumoccal-up to date, Influenza-given today Tdap-up to date, Zostavax-up to date  Electrocardiogram-done 02/2016  Cardiovascular disease screening-lipids routinely checked  Diabetes Screening-with routine labs  Glaucoma screening-regular exams with Dr Rosendo Gros  Nutrition counseling   Patient Instructions (the written plan) were given to the patient.  I have reviewed and agree with the above AWV documentation.  Wardell Honour MD   Chong Sicilian, RN   04/19/2016

## 2016-04-20 LAB — THYROID PANEL WITH TSH
FREE THYROXINE INDEX: 2 (ref 1.2–4.9)
T3 Uptake Ratio: 27 % (ref 24–39)
T4 TOTAL: 7.3 ug/dL (ref 4.5–12.0)
TSH: 3.17 u[IU]/mL (ref 0.450–4.500)

## 2016-04-26 ENCOUNTER — Telehealth: Payer: Self-pay | Admitting: Family Medicine

## 2016-05-26 NOTE — Telephone Encounter (Signed)
Several attempts have been made to contact patient this encounter will be closed.  

## 2016-06-24 ENCOUNTER — Other Ambulatory Visit: Payer: Medicare HMO

## 2016-06-24 DIAGNOSIS — E559 Vitamin D deficiency, unspecified: Secondary | ICD-10-CM

## 2016-06-24 DIAGNOSIS — E0590 Thyrotoxicosis, unspecified without thyrotoxic crisis or storm: Secondary | ICD-10-CM

## 2016-06-24 DIAGNOSIS — E0500 Thyrotoxicosis with diffuse goiter without thyrotoxic crisis or storm: Secondary | ICD-10-CM | POA: Diagnosis not present

## 2016-06-24 DIAGNOSIS — E7800 Pure hypercholesterolemia, unspecified: Secondary | ICD-10-CM | POA: Diagnosis not present

## 2016-06-24 DIAGNOSIS — R972 Elevated prostate specific antigen [PSA]: Secondary | ICD-10-CM

## 2016-06-24 DIAGNOSIS — I10 Essential (primary) hypertension: Secondary | ICD-10-CM | POA: Diagnosis not present

## 2016-06-25 ENCOUNTER — Encounter: Payer: Self-pay | Admitting: Family Medicine

## 2016-06-25 DIAGNOSIS — D696 Thrombocytopenia, unspecified: Secondary | ICD-10-CM | POA: Insufficient documentation

## 2016-06-25 LAB — CBC WITH DIFFERENTIAL/PLATELET
BASOS ABS: 0 10*3/uL (ref 0.0–0.2)
Basos: 1 %
EOS (ABSOLUTE): 0 10*3/uL (ref 0.0–0.4)
Eos: 1 %
HEMOGLOBIN: 12.8 g/dL — AB (ref 13.0–17.7)
Hematocrit: 38.7 % (ref 37.5–51.0)
IMMATURE GRANS (ABS): 0 10*3/uL (ref 0.0–0.1)
Immature Granulocytes: 0 %
LYMPHS: 32 %
Lymphocytes Absolute: 1.3 10*3/uL (ref 0.7–3.1)
MCH: 34.8 pg — AB (ref 26.6–33.0)
MCHC: 33.1 g/dL (ref 31.5–35.7)
MCV: 105 fL — ABNORMAL HIGH (ref 79–97)
MONOCYTES: 12 %
Monocytes Absolute: 0.5 10*3/uL (ref 0.1–0.9)
Neutrophils Absolute: 2.2 10*3/uL (ref 1.4–7.0)
Neutrophils: 54 %
Platelets: 149 10*3/uL — ABNORMAL LOW (ref 150–379)
RBC: 3.68 x10E6/uL — AB (ref 4.14–5.80)
RDW: 16 % — ABNORMAL HIGH (ref 12.3–15.4)
WBC: 4 10*3/uL (ref 3.4–10.8)

## 2016-06-25 LAB — BMP8+EGFR
BUN/Creatinine Ratio: 11 (ref 10–24)
BUN: 12 mg/dL (ref 8–27)
CALCIUM: 9.2 mg/dL (ref 8.6–10.2)
CHLORIDE: 101 mmol/L (ref 96–106)
CO2: 28 mmol/L (ref 18–29)
CREATININE: 1.07 mg/dL (ref 0.76–1.27)
GFR calc non Af Amer: 67 mL/min/{1.73_m2} (ref >59)
GFR, EST AFRICAN AMERICAN: 78 mL/min/{1.73_m2} (ref >59)
Glucose: 96 mg/dL (ref 65–99)
Potassium: 4.5 mmol/L (ref 3.5–5.2)
Sodium: 142 mmol/L (ref 134–144)

## 2016-06-25 LAB — NMR, LIPOPROFILE
CHOLESTEROL: 116 mg/dL (ref 100–199)
HDL Cholesterol by NMR: 38 mg/dL — ABNORMAL LOW (ref >39)
HDL Particle Number: 24.1 umol/L — ABNORMAL LOW (ref >=30.5)
LDL Particle Number: 779 nmol/L (ref <1000)
LDL SIZE: 20.6 nm (ref >20.5)
LDL-C: 61 mg/dL (ref 0–99)
LP-IR Score: 30 (ref <=45)
SMALL LDL PARTICLE NUMBER: 308 nmol/L (ref <=527)
TRIGLYCERIDES BY NMR: 87 mg/dL (ref 0–149)

## 2016-06-25 LAB — THYROID PANEL WITH TSH
Free Thyroxine Index: 1.7 (ref 1.2–4.9)
T3 Uptake Ratio: 26 % (ref 24–39)
T4, Total: 6.4 ug/dL (ref 4.5–12.0)
TSH: 5.16 u[IU]/mL — ABNORMAL HIGH (ref 0.450–4.500)

## 2016-06-25 LAB — HEPATIC FUNCTION PANEL
ALBUMIN: 3.9 g/dL (ref 3.5–4.8)
ALK PHOS: 52 IU/L (ref 39–117)
ALT: 21 IU/L (ref 0–44)
AST: 28 IU/L (ref 0–40)
BILIRUBIN, DIRECT: 0.19 mg/dL (ref 0.00–0.40)
Bilirubin Total: 0.8 mg/dL (ref 0.0–1.2)
TOTAL PROTEIN: 6.6 g/dL (ref 6.0–8.5)

## 2016-06-25 LAB — VITAMIN D 25 HYDROXY (VIT D DEFICIENCY, FRACTURES): Vit D, 25-Hydroxy: 54.8 ng/mL (ref 30.0–100.0)

## 2016-06-28 DIAGNOSIS — E0500 Thyrotoxicosis with diffuse goiter without thyrotoxic crisis or storm: Secondary | ICD-10-CM | POA: Diagnosis not present

## 2016-06-29 ENCOUNTER — Encounter: Payer: Self-pay | Admitting: Family Medicine

## 2016-06-29 ENCOUNTER — Ambulatory Visit (INDEPENDENT_AMBULATORY_CARE_PROVIDER_SITE_OTHER): Payer: Medicare HMO | Admitting: Family Medicine

## 2016-06-29 VITALS — BP 122/67 | HR 81 | Temp 97.5°F | Ht 66.0 in | Wt 177.0 lb

## 2016-06-29 DIAGNOSIS — G629 Polyneuropathy, unspecified: Secondary | ICD-10-CM | POA: Diagnosis not present

## 2016-06-29 DIAGNOSIS — L723 Sebaceous cyst: Secondary | ICD-10-CM

## 2016-06-29 DIAGNOSIS — E559 Vitamin D deficiency, unspecified: Secondary | ICD-10-CM | POA: Diagnosis not present

## 2016-06-29 DIAGNOSIS — E0500 Thyrotoxicosis with diffuse goiter without thyrotoxic crisis or storm: Secondary | ICD-10-CM | POA: Diagnosis not present

## 2016-06-29 DIAGNOSIS — E7800 Pure hypercholesterolemia, unspecified: Secondary | ICD-10-CM | POA: Diagnosis not present

## 2016-06-29 DIAGNOSIS — I10 Essential (primary) hypertension: Secondary | ICD-10-CM

## 2016-06-29 DIAGNOSIS — R972 Elevated prostate specific antigen [PSA]: Secondary | ICD-10-CM | POA: Diagnosis not present

## 2016-06-29 DIAGNOSIS — E0590 Thyrotoxicosis, unspecified without thyrotoxic crisis or storm: Secondary | ICD-10-CM

## 2016-06-29 NOTE — Addendum Note (Signed)
Addended by: Zannie Cove on: 06/29/2016 03:05 PM   Modules accepted: Orders

## 2016-06-29 NOTE — Patient Instructions (Addendum)
Medicare Annual Wellness Visit  [facility] and the medical providers at [facility] strive to bring you the best medical care.  In doing so we not only want to address your current medical conditions and concerns but also to detect new conditions early and prevent illness, disease and health-related problems.    Medicare offers a yearly Wellness Visit which allows our clinical staff to assess your need for preventative services including immunizations, lifestyle education, counseling to decrease risk of preventable diseases and screening for fall risk and other medical concerns.    This visit is provided free of charge (no copay) for all Medicare recipients. The clinical pharmacists at [facility] have begun to conduct these Wellness Visits which will also include a thorough review of all your medications.    As you primary medical provider recommend that you make an appointment for your Annual Wellness Visit if you have not done so already this year.  You may set up this appointment before you leave today or you may call back [phone] and schedule an appointment.  Please make sure when you call that you mention that you are scheduling your Annual Wellness Visit with the clinical pharmacist so that the appointment may be made for the proper length of time.     Continue current medications. Continue good therapeutic lifestyle changes which include good diet and exercise. Fall precautions discussed with patient. If an FOBT was given today- please return it to our front desk. If you are over 104 years old - you may need Prevnar 66 or the adult Pneumonia vaccine.  **Flu shots are available--- please call and schedule a FLU-CLINIC appointment**  After your visit with [place] today you will receive a survey in the mail or online from [employer] regarding your care with [place]. Please take a moment to fill this out. Your feedback is very  important to [place] as you can help [place] better understand your patient needs as well as improve your experience and satisfaction. WE CARE ABOUT YOU!!!   Patient will continue to follow-up with endocrinology and with ophthalmology at [facility]. We will resend the lab results to his endocrinologist. Take probiotic as directed and see if this will help the gas situation Try to get some walking exercise in if possible

## 2016-06-29 NOTE — Progress Notes (Addendum)
Subjective:    Patient ID: Mario Smith, male    DOB: May 02, 1941, 76 y.o.   MRN: [id number]  HPI  Pt here for follow up and management of chronic medical problems which includes hyperthyroid, hypertension and hyperlipidemia. He is taking medication regularly.This patient brings in blood pressures for review and all of these are good and I will be scanned into the record. He's had recent lab work done and a TSH was slightly elevated. A copy of this report will be sent to his endocrinologist for further adjustment of thyroid medication if necessary. The blood sugar was good at 96. The creatinine, the most important kidney function test was within normal limits as well as with the electrolytes being within normal limits. The white blood cell count was not elevated and the hemoglobin was slightly decreased at 12.8 and the platelet count remains slightly decreased at 149,000. All liver function tests were normal. The vitamin D level was good at 54.8. Cholesterol levels with advanced lipid testing have an LDL particle number that was good at 779 and an LDL C is good at 61. Triglycerides are also good at 87. The HDL particle number the good cholesterol was low. This lab results will be reviewed with the patient he will be given a copy of the report to take with him to his other doctors. He also sees the ophthalmologist at [facility] because of the thyroid impact on his eyes. The lab work has been sent to the endocrinologist but as of yesterday he had not seen this. We will send it again to him. The patient has had the flu and seems to be recovering from this. He has no other complaints that are not on the back of his neck and we will check this today. Patient denies any chest pain or shortness of breath. He is concerned he is put on a lot of weight because of having had the flu and not being able to get any exercise. He denies any trouble with nausea vomiting diarrhea blood in the  stool or black tarry bowel movements. He is passing his water without problems.     Patient Active Problem List   Diagnosis Date Noted  . Thrombocytopenia ([place]) 06/25/2016  . Hyperthyroidism 02/24/2016  . Hyperlipidemia 05/27/2013  . Vitamin D deficiency 05/27/2013  . Hypertension 02/11/2010   Outpatient Encounter Prescriptions as of 06/29/2016  Medication Sig  . aspirin 81 MG EC tablet Take 81 mg by mouth daily.    . Carboxymethylcellulose Sodium (REFRESH TEARS OP) Apply 1 drop to eye daily as needed (dry eyes).   . Cholecalciferol (VITAMIN D3) 2000 UNITS TABS Take 1 tablet by mouth daily.    . Coenzyme Q10 (CO Q 10 PO) Take 2 capsules by mouth daily.   . fluticasone (FLONASE) 50 MCG/ACT nasal spray Place 2 sprays into both nostrils daily.  . hydrochlorothiazide (MICROZIDE) 12.5 MG capsule TAKE ONE CAPSULE BY MOUTH ONCE DAILY  . isoniazid (NYDRAZID) 300 MG tablet Take 300 mg by mouth daily.  Marland Kitchen losartan (COZAAR) 100 MG tablet TAKE ONE TABLET BY MOUTH ONCE DAILY  . methimazole (TAPAZOLE) 10 MG tablet Take 10 mg by mouth daily.  . Omega-3 Fatty Acids (FISH OIL) 1000 MG CAPS Take 1 capsule by mouth 2 (two) times daily.    . rosuvastatin (CRESTOR) 20 MG tablet Take 1 tablet (20 mg total) by mouth daily.  . Selenium 100 MCG TABS Take 1 tablet by mouth daily.   Marland Kitchen  UNABLE TO FIND Med Name: VIT B-6 - 25 mg - one a day  . [DISCONTINUED] doxycycline (VIBRA-TABS) 100 MG tablet Take 1 tablet (100 mg total) by mouth 2 (two) times daily.   No facility-administered encounter medications on file as of 06/29/2016.       Review of Systems  Constitutional: Negative.   HENT: Negative.   Eyes: Negative.   Respiratory: Negative.   Cardiovascular: Negative.   Gastrointestinal: Negative.   Endocrine: Negative.   Genitourinary: Negative.   Musculoskeletal: Negative.        Knot on back of neck  Skin: Negative.   Allergic/Immunologic: Negative.   Neurological: Negative.   Hematological: Negative.    Psychiatric/Behavioral: Negative.        Objective:   Physical Exam  Constitutional: He is oriented to person, place, and time. He appears well-developed and well-nourished. No distress.  HENT:  Head: Normocephalic and atraumatic.  Right Ear: External ear normal.  Left Ear: External ear normal.  Nose: Nose normal.  Mouth/Throat: Oropharynx is clear and moist. No oropharyngeal exudate.  Minimal nasal congestion  Eyes: Conjunctivae and EOM are normal. Pupils are equal, round, and reactive to light. Right eye exhibits no discharge. Left eye exhibits no discharge. No scleral icterus.  Neck: Normal range of motion. Neck supple. No thyromegaly present.  Cardiovascular: Normal rate, regular rhythm, normal heart sounds and intact distal pulses.   No murmur heard. Heart was regular at 72/m  Pulmonary/Chest: Effort normal and breath sounds normal. No respiratory distress. He has no wheezes. He has no rales. He exhibits no tenderness.  No axillary adenopathy  Abdominal: Soft. Bowel sounds are normal. He exhibits no mass. There is no tenderness. There is no rebound and no guarding.  Minimal abdominal gas  Musculoskeletal: Normal range of motion. He exhibits no edema.  Lymphadenopathy:    He has no cervical adenopathy.  Neurological: He is alert and oriented to person, place, and time. He has normal reflexes. No cranial nerve deficit.  Skin: Skin is warm and dry. No rash noted. No erythema. No pallor.  Use a knot on the posterior neck which appears to be a sebaceous cyst but it is not inflamed and it is mobile.  Psychiatric: He has a normal mood and affect. His behavior is normal. Judgment and thought content normal.  Nursing note and vitals reviewed.   BP 122/67 (BP Location: Left Arm)   Pulse 81   Temp 97.5 F (36.4 C) (Oral)   Ht 5\' 6"  (1.676 m)   Wt 177 lb (80.3 kg)   BMI 28.57 kg/m        Assessment & Plan:  1. Hyperthyroidism -The patient will continue to follow-up with the  endocrinologist  2. Hypertension -The blood pressure is good today and he will continue with current treatment  3. Pure hypercholesterolemia -All cholesterol numbers were good and he will continue with current treatment  4. Vitamin D deficiency -The vitamin D level was good and he will continue with current treatment  5. Elevated PSA -This was not checked today. The patient is not having any problems with passing his water.  6. Polyneuropathy ([place])  7. Graves disease -The TSH was slightly up today compared to it being normal at the last check. We will check another TSH in 4-5 weeks as requested by the endocrinologist to make sure he gets a copy of this report.  8. Sebaceous cyst -We will continue to monitor this at each visit.  Patient Instructions  Medicare Annual Wellness Visit  [facility] and the medical providers at [facility] strive to bring you the best medical care.  In doing so we not only want to address your current medical conditions and concerns but also to detect new conditions early and prevent illness, disease and health-related problems.    Medicare offers a yearly Wellness Visit which allows our clinical staff to assess your need for preventative services including immunizations, lifestyle education, counseling to decrease risk of preventable diseases and screening for fall risk and other medical concerns.    This visit is provided free of charge (no copay) for all Medicare recipients. The clinical pharmacists at [facility] have begun to conduct these Wellness Visits which will also include a thorough review of all your medications.    As you primary medical provider recommend that you make an appointment for your Annual Wellness Visit if you have not done so already this year.  You may set up this appointment before you leave today or you may call back [phone] and schedule an appointment.  Please  make sure when you call that you mention that you are scheduling your Annual Wellness Visit with the clinical pharmacist so that the appointment may be made for the proper length of time.     Continue current medications. Continue good therapeutic lifestyle changes which include good diet and exercise. Fall precautions discussed with patient. If an FOBT was given today- please return it to our front desk. If you are over 70 years old - you may need Prevnar 40 or the adult Pneumonia vaccine.  **Flu shots are available--- please call and schedule a FLU-CLINIC appointment**  After your visit with [place] today you will receive a survey in the mail or online from [employer] regarding your care with [place]. Please take a moment to fill this out. Your feedback is very important to [place] as you can help [place] better understand your patient needs as well as improve your experience and satisfaction. WE CARE ABOUT YOU!!!   Patient will continue to follow-up with endocrinology and with ophthalmology at [facility]. We will resend the lab results to his endocrinologist. Take probiotic as directed and see if this will help the gas situation Try to get some walking exercise in if possible  Arrie Senate MD

## 2016-07-11 ENCOUNTER — Telehealth: Payer: Self-pay | Admitting: Family Medicine

## 2016-07-11 NOTE — Telephone Encounter (Signed)
Pt aware of all lab results. He has not heard from Dr. Shawn Route office about his thyroid results. I have sent these through Epic as well as printed them and sent them via fax.

## 2016-07-12 ENCOUNTER — Telehealth: Payer: Self-pay | Admitting: Family Medicine

## 2016-07-12 DIAGNOSIS — E0789 Other specified disorders of thyroid: Secondary | ICD-10-CM | POA: Diagnosis not present

## 2016-07-12 DIAGNOSIS — H5021 Vertical strabismus, right eye: Secondary | ICD-10-CM | POA: Diagnosis not present

## 2016-07-12 NOTE — Telephone Encounter (Signed)
I faxed last thyroid labs to [phone] to Attn: Lattie Haw at [facility].

## 2016-07-15 ENCOUNTER — Telehealth: Payer: Self-pay | Admitting: Family Medicine

## 2016-07-15 NOTE — Telephone Encounter (Signed)
Enlarging cyst on neck appt scheduled

## 2016-07-16 ENCOUNTER — Other Ambulatory Visit: Payer: Self-pay | Admitting: Family Medicine

## 2016-07-16 DIAGNOSIS — J301 Allergic rhinitis due to pollen: Secondary | ICD-10-CM

## 2016-07-21 ENCOUNTER — Encounter: Payer: Self-pay | Admitting: Family Medicine

## 2016-07-21 ENCOUNTER — Ambulatory Visit (INDEPENDENT_AMBULATORY_CARE_PROVIDER_SITE_OTHER): Payer: Medicare HMO | Admitting: Family Medicine

## 2016-07-21 VITALS — BP 130/71 | HR 78 | Temp 97.1°F | Ht 66.0 in | Wt 180.4 lb

## 2016-07-21 DIAGNOSIS — L723 Sebaceous cyst: Secondary | ICD-10-CM

## 2016-07-21 MED ORDER — CEPHALEXIN 500 MG PO CAPS: 500.0000 mg | ORAL_CAPSULE | Freq: Three times a day (TID) | ORAL | 0 refills | Status: DC

## 2016-07-21 NOTE — Progress Notes (Addendum)
   HPI  Patient presents today here with painful cyst.  Patient states the cyst is been present for a 3-6 months. He denies any inflammation episodes previously. For the last 5-6 days he's had pain at that site, irritation, and pain with laying down at night.  He requests treatment today.  He denies any fever, chills, sweats, or other concerns.  Overall he is feeling well.  Patient was considering getting it removed by dermatology until this happened.  PMH: Smoking status noted ROS: Per HPI  Objective: BP 130/71   Pulse 78   Temp 97.1 F (36.2 C) (Oral)   Ht 5\' 6"  (1.676 m)   Wt 180 lb 6.4 oz (81.8 kg)   BMI 29.12 kg/m  Gen: NAD, alert, cooperative with exam HEENT: NCAT CV: RRR, good S1/S2, no murmur Resp: CTABL, no wheezes, non-labored Ext: No edema, warm Neuro: Alert and oriented, No gross deficits Skin:  2 cm x 1.5 cm raised area on the posterior neck over the C6 area.   Incision and drainage Area was cleaned without prep pad and anesthetized using 1.5 mL of 2% Xylocaine with epinephrine. After analgesia was complete, the area was cleaned with Betadine 2 and white clear with alcohol. Using an 11 blade a 1.0 cm incision was made over the top of the cyst. With palpation and exploration of the cyst with hemostats a moderate amount of sebaceous material was produced. There is very minimal bleeding. The area was covered with a pressure bandage using gauze and Hypafix.  Assessment and plan:  # Inflamed sebaceous cyst Incised as above today Given Keflex 7 days Dermatology referral for complete removal after healing, requested appointment at least 2 months away. Discussed usual course of healing, follow-up as previously planned      Orders Placed This Encounter  Procedures  . Ambulatory referral to Dermatology    Referral Priority:   Routine    Referral Type:   Consultation    Referral Reason:   Specialty Services Required    Requested Specialty:   Dermatology      Number of Visits Requested:   1    Meds ordered this encounter  Medications  . cephALEXin (KEFLEX) 500 MG capsule    Sig: Take 1 capsule (500 mg total) by mouth 3 (three) times daily.    Dispense:  21 capsule    Refill:  [place], MD [facility] 07/21/2016, 11:42 AM

## 2016-07-21 NOTE — Patient Instructions (Addendum)
Great to see you!  We will work on a dermatology referral, we will ask them to wait 2 months before an appointment  Take all antibiotics  Leave the current bandage on until tomorrow morning, after that a simple bandaid is ok. Do not soak in a tub until it is totally healed.

## 2016-08-08 ENCOUNTER — Other Ambulatory Visit (INDEPENDENT_AMBULATORY_CARE_PROVIDER_SITE_OTHER): Payer: Medicare HMO

## 2016-08-08 DIAGNOSIS — E0590 Thyrotoxicosis, unspecified without thyrotoxic crisis or storm: Secondary | ICD-10-CM

## 2016-08-09 DIAGNOSIS — E0500 Thyrotoxicosis with diffuse goiter without thyrotoxic crisis or storm: Secondary | ICD-10-CM | POA: Diagnosis not present

## 2016-08-09 LAB — THYROID PANEL WITH TSH
Free Thyroxine Index: 1.5 (ref 1.2–4.9)
T3 UPTAKE RATIO: 24 % (ref 24–39)
T4 TOTAL: 6.3 ug/dL (ref 4.5–12.0)
TSH: 8.57 u[IU]/mL — AB (ref 0.450–4.500)

## 2016-09-05 ENCOUNTER — Other Ambulatory Visit: Payer: Self-pay | Admitting: Family Medicine

## 2016-09-19 ENCOUNTER — Other Ambulatory Visit: Payer: Self-pay | Admitting: Family Medicine

## 2016-09-22 DIAGNOSIS — L814 Other melanin hyperpigmentation: Secondary | ICD-10-CM | POA: Diagnosis not present

## 2016-09-22 DIAGNOSIS — L570 Actinic keratosis: Secondary | ICD-10-CM | POA: Diagnosis not present

## 2016-09-22 DIAGNOSIS — L910 Hypertrophic scar: Secondary | ICD-10-CM | POA: Diagnosis not present

## 2016-10-12 DIAGNOSIS — E0500 Thyrotoxicosis with diffuse goiter without thyrotoxic crisis or storm: Secondary | ICD-10-CM | POA: Diagnosis not present

## 2016-10-12 DIAGNOSIS — H5021 Vertical strabismus, right eye: Secondary | ICD-10-CM | POA: Diagnosis not present

## 2016-10-12 DIAGNOSIS — H532 Diplopia: Secondary | ICD-10-CM | POA: Diagnosis not present

## 2016-10-17 ENCOUNTER — Other Ambulatory Visit: Payer: Medicare HMO

## 2016-10-17 DIAGNOSIS — E7800 Pure hypercholesterolemia, unspecified: Secondary | ICD-10-CM

## 2016-10-17 DIAGNOSIS — E0590 Thyrotoxicosis, unspecified without thyrotoxic crisis or storm: Secondary | ICD-10-CM

## 2016-10-17 DIAGNOSIS — E559 Vitamin D deficiency, unspecified: Secondary | ICD-10-CM | POA: Diagnosis not present

## 2016-10-17 DIAGNOSIS — I10 Essential (primary) hypertension: Secondary | ICD-10-CM | POA: Diagnosis not present

## 2016-10-18 ENCOUNTER — Telehealth: Payer: Self-pay | Admitting: *Deleted

## 2016-10-18 LAB — VITAMIN D 25 HYDROXY (VIT D DEFICIENCY, FRACTURES): Vit D, 25-Hydroxy: 50.6 ng/mL (ref 30.0–100.0)

## 2016-10-18 LAB — BMP8+EGFR
BUN / CREAT RATIO: 16 (ref 10–24)
BUN: 17 mg/dL (ref 8–27)
CHLORIDE: 101 mmol/L (ref 96–106)
CO2: 25 mmol/L (ref 18–29)
CREATININE: 1.09 mg/dL (ref 0.76–1.27)
Calcium: 9.2 mg/dL (ref 8.6–10.2)
GFR calc Af Amer: 76 mL/min/{1.73_m2} (ref >59)
GFR calc non Af Amer: 66 mL/min/{1.73_m2} (ref >59)
GLUCOSE: 100 mg/dL — AB (ref 65–99)
Potassium: 4.3 mmol/L (ref 3.5–5.2)
Sodium: 141 mmol/L (ref 134–144)

## 2016-10-18 LAB — HEPATIC FUNCTION PANEL
ALBUMIN: 4.4 g/dL (ref 3.5–4.8)
ALK PHOS: 58 IU/L (ref 39–117)
ALT: 16 IU/L (ref 0–44)
AST: 27 IU/L (ref 0–40)
Bilirubin Total: 0.6 mg/dL (ref 0.0–1.2)
Bilirubin, Direct: 0.16 mg/dL (ref 0.00–0.40)
TOTAL PROTEIN: 6.7 g/dL (ref 6.0–8.5)

## 2016-10-18 LAB — CBC WITH DIFFERENTIAL/PLATELET
Basophils Absolute: 0 10*3/uL (ref 0.0–0.2)
Basos: 1 %
EOS (ABSOLUTE): 0.1 10*3/uL (ref 0.0–0.4)
EOS: 3 %
HEMOGLOBIN: 13.4 g/dL (ref 13.0–17.7)
Hematocrit: 38.2 % (ref 37.5–51.0)
IMMATURE GRANULOCYTES: 0 %
Immature Grans (Abs): 0 10*3/uL (ref 0.0–0.1)
LYMPHS ABS: 1.4 10*3/uL (ref 0.7–3.1)
Lymphs: 34 %
MCH: 38.8 pg — ABNORMAL HIGH (ref 26.6–33.0)
MCHC: 35.1 g/dL (ref 31.5–35.7)
MCV: 111 fL — ABNORMAL HIGH (ref 79–97)
MONOCYTES: 12 %
Monocytes Absolute: 0.5 10*3/uL (ref 0.1–0.9)
NEUTROS PCT: 50 %
Neutrophils Absolute: 2.2 10*3/uL (ref 1.4–7.0)
Platelets: 166 10*3/uL (ref 150–379)
RBC: 3.45 x10E6/uL — AB (ref 4.14–5.80)
RDW: 14.6 % (ref 12.3–15.4)
WBC: 4.3 10*3/uL (ref 3.4–10.8)

## 2016-10-18 LAB — NMR, LIPOPROFILE
Cholesterol: 112 mg/dL (ref 100–199)
HDL Cholesterol by NMR: 35 mg/dL — ABNORMAL LOW (ref >39)
HDL Particle Number: 23.8 umol/L — ABNORMAL LOW (ref >=30.5)
LDL PARTICLE NUMBER: 948 nmol/L (ref <1000)
LDL SIZE: 20.5 nm (ref >20.5)
LDL-C: 64 mg/dL (ref 0–99)
LP-IR Score: 39 (ref <=45)
Small LDL Particle Number: 400 nmol/L (ref <=527)
TRIGLYCERIDES BY NMR: 67 mg/dL (ref 0–149)

## 2016-10-18 NOTE — Telephone Encounter (Signed)
-----   Message from Chipper Herb, MD sent at 10/18/2016  9:58 AM EDT ----- Cholesterol numbers with advanced lipid testing are still pending====== The blood sugar is minimally elevated at 100. We will continue to monitor this. It should be less than 100. The creatinine, the most important kidney function test is within normal limits. All of the electrolytes including potassium are good. The CBC has a normal white blood cell count. The hemoglobin is good this time and within normal limits at 13.4. The platelet count is adequate. All liver function tests are within normal limits The vitamin D level is good at 50.6 and he should continue with current treatment Please add a thyroid profile to this blood work as he is being treated for hyperthyroidism++++++++ When all of the lab work is returned we should send a copy of these lab results to Dr. Dorian Heckle at [facility]++++++

## 2016-10-18 NOTE — Telephone Encounter (Signed)
Pt notified of results Verbalizes understanding Thyroid profile added

## 2016-10-19 ENCOUNTER — Telehealth: Payer: Self-pay | Admitting: *Deleted

## 2016-10-19 LAB — THYROID PANEL WITH TSH
FREE THYROXINE INDEX: 1.8 (ref 1.2–4.9)
T3 Uptake Ratio: 25 % (ref 24–39)
T4, Total: 7.3 ug/dL (ref 4.5–12.0)
TSH: 6.21 u[IU]/mL — ABNORMAL HIGH (ref 0.450–4.500)

## 2016-10-19 LAB — SPECIMEN STATUS REPORT

## 2016-10-19 NOTE — Telephone Encounter (Signed)
Pt notified of results copy sent to Dr Nicoletta Dress

## 2016-10-19 NOTE — Telephone Encounter (Signed)
-----   Message from Zannie Cove, LPN sent at 1/61/0960  8:45 AM EDT -----   ----- Message ----- From: Chipper Herb, MD Sent: 10/18/2016   9:58 AM To: Zannie Cove, LPN, Renaye Rakers Eligio Angert  Cholesterol numbers with advanced lipid testing are still pending====== The blood sugar is minimally elevated at 100. We will continue to monitor this. It should be less than 100. The creatinine, the most important kidney function test is within normal limits. All of the electrolytes including potassium are good. The CBC has a normal white blood cell count. The hemoglobin is good this time and within normal limits at 13.4. The platelet count is adequate. All liver function tests are within normal limits The vitamin D level is good at 50.6 and he should continue with current treatment Please add a thyroid profile to this blood work as he is being treated for hyperthyroidism++++++++ When all of the lab work is returned we should send a copy of these lab results to Dr. Dorian Heckle at [facility]++++++

## 2016-10-20 ENCOUNTER — Telehealth: Payer: Self-pay | Admitting: Family Medicine

## 2016-10-20 NOTE — Telephone Encounter (Signed)
Faxed labs to [phone]

## 2016-10-24 ENCOUNTER — Telehealth: Payer: Self-pay | Admitting: Family Medicine

## 2016-10-24 NOTE — Telephone Encounter (Signed)
Talked to Roselyn Reef and she will take care and fax to Dr Nicoletta Dress

## 2016-10-25 ENCOUNTER — Ambulatory Visit: Payer: Medicare HMO | Admitting: Family Medicine

## 2016-10-27 ENCOUNTER — Ambulatory Visit (INDEPENDENT_AMBULATORY_CARE_PROVIDER_SITE_OTHER): Payer: Medicare HMO | Admitting: Family Medicine

## 2016-10-27 ENCOUNTER — Encounter: Payer: Self-pay | Admitting: Family Medicine

## 2016-10-27 VITALS — BP 115/72 | HR 82 | Temp 97.5°F | Ht 66.0 in | Wt 184.0 lb

## 2016-10-27 DIAGNOSIS — E559 Vitamin D deficiency, unspecified: Secondary | ICD-10-CM | POA: Diagnosis not present

## 2016-10-27 DIAGNOSIS — E0590 Thyrotoxicosis, unspecified without thyrotoxic crisis or storm: Secondary | ICD-10-CM

## 2016-10-27 DIAGNOSIS — E0500 Thyrotoxicosis with diffuse goiter without thyrotoxic crisis or storm: Secondary | ICD-10-CM

## 2016-10-27 DIAGNOSIS — I10 Essential (primary) hypertension: Secondary | ICD-10-CM

## 2016-10-27 DIAGNOSIS — R972 Elevated prostate specific antigen [PSA]: Secondary | ICD-10-CM | POA: Diagnosis not present

## 2016-10-27 NOTE — Patient Instructions (Addendum)
Medicare Annual Wellness Visit  [facility] and the medical providers at [facility] strive to bring you the best medical care.  In doing so we not only want to address your current medical conditions and concerns but also to detect new conditions early and prevent illness, disease and health-related problems.    Medicare offers a yearly Wellness Visit which allows our clinical staff to assess your need for preventative services including immunizations, lifestyle education, counseling to decrease risk of preventable diseases and screening for fall risk and other medical concerns.    This visit is provided free of charge (no copay) for all Medicare recipients. The clinical pharmacists at [facility] have begun to conduct these Wellness Visits which will also include a thorough review of all your medications.    As you primary medical provider recommend that you make an appointment for your Annual Wellness Visit if you have not done so already this year.  You may set up this appointment before you leave today or you may call back ([phone]) and schedule an appointment.  Please make sure when you call that you mention that you are scheduling your Annual Wellness Visit with the clinical pharmacist so that the appointment may be made for the proper length of time.     Continue current medications. Continue good therapeutic lifestyle changes which include good diet and exercise. Fall precautions discussed with patient. If an FOBT was given today- please return it to our front desk. If you are over 65 years old - you may need Prevnar 46 or the adult Pneumonia vaccine.  **Flu shots are available--- please call and schedule a FLU-CLINIC appointment**  After your visit with [place] today you will receive a survey in the mail or online from [employer] regarding your care with [place]. Please take a moment to fill this out. Your feedback is very  important to [place] as you can help [place] better understand your patient needs as well as improve your experience and satisfaction. WE CARE ABOUT YOU!!!  Follow-up with ophthalmology and endocrinology as planned Try to reduce caffeine intake some If the heart irregularity continues with exercise even though you're not having any symptoms with this and let us know and we will do a monitor to see what kind of arrhythmia or you could be having with this.

## 2016-10-27 NOTE — Progress Notes (Signed)
Subjective:    Patient ID: Mario Smith, male    DOB: 1940-08-03, 76 y.o.   MRN: [id number]  HPI Pt here for follow up and management of chronic medical problems which includes hyperlipidemia. 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 The eye condition is very frustrating to him. He has had recent lab work done and a copy of this was faxed to the endocrinologist. I'll his cholesterol numbers with a advanced lipid testing remain good and at goal except the good cholesterol or the HDL particle number remains low as it has been in the past. Triglycerides are good at 67 and the total LDL particle number is good at 948 with a good LDL C is 64. The blood sugar was slightly elevated at 100 but the creatinine and the electrolytes were all within normal limits. The CBC had a normal white blood cell count with a good and slightly improved hemoglobin at 13.4. The platelet count was adequate. All liver function tests were normal. The vitamin D level is good at 50.6. The most recent TSH was 6.20. Because of the persistent exophthalmos his ophthalmologist is thinking about doing surgery on this and may want to send him to do to have this done. The patient denies any chest pain other than some that is relieved with belching or burping. He denies any shortness of breath. He denies any trouble with nausea vomiting or diarrhea but does have some constipation which is been helped with the MiraLAX that he is using. He's passing his water without problems. The patient mentions that his ophthalmologist is going to be retiring soon and that he wanted a copy of his cholesterol numbers and we will fax that specifically to him. He is due to return an FOBT and will be given one today to return.    Patient Active Problem List   Diagnosis Date Noted  .  Thrombocytopenia ([place]) 06/25/2016  . Hyperthyroidism 02/24/2016  . Hyperlipidemia 05/27/2013  . Vitamin D deficiency 05/27/2013  . Hypertension 02/11/2010   Outpatient Encounter Prescriptions as of 10/27/2016  Medication Sig  . aspirin 81 MG EC tablet Take 81 mg by mouth daily.    . Carboxymethylcellulose Sodium (REFRESH TEARS OP) Apply 1 drop to eye daily as needed (dry eyes).   . cephALEXin (KEFLEX) 500 MG capsule Take 1 capsule (500 mg total) by mouth 3 (three) times daily.  . Cholecalciferol (VITAMIN D3) 2000 UNITS TABS Take 1 tablet by mouth daily.    . Coenzyme Q10 (CO Q 10 PO) Take 2 capsules by mouth daily.   . fluticasone (FLONASE) 50 MCG/ACT nasal spray USE TWO SPRAY(S) IN EACH NOSTRIL ONCE DAILY  . hydrochlorothiazide (MICROZIDE) 12.5 MG capsule TAKE ONE CAPSULE BY MOUTH ONCE DAILY  . isoniazid (NYDRAZID) 300 MG tablet Take 300 mg by mouth daily.  Marland Kitchen losartan (COZAAR) 100 MG tablet TAKE ONE TABLET BY MOUTH ONCE DAILY  . methimazole (TAPAZOLE) 10 MG tablet Take 10 mg by mouth daily.  . Omega-3 Fatty Acids (FISH OIL) 1000 MG CAPS Take 1 capsule by mouth 2 (two) times daily.    . rosuvastatin (CRESTOR) 20 MG tablet Take 1 tablet (20 mg total) by mouth daily.  . Selenium 100 MCG TABS Take 1 tablet by mouth daily.   Marland Kitchen UNABLE TO FIND Med Name: VIT B-6 - 25 mg - one a  day   No facility-administered encounter medications on file as of 10/27/2016.       Review of Systems  Constitutional: Negative.   HENT: Negative.   Eyes: Negative.   Respiratory: Negative.   Cardiovascular: Negative.   Gastrointestinal: Negative.   Endocrine: Negative.   Genitourinary: Negative.   Musculoskeletal: Negative.   Skin: Negative.   Allergic/Immunologic: Negative.   Neurological: Negative.   Hematological: Negative.   Psychiatric/Behavioral: Negative.        Objective:   Physical Exam  Constitutional: He is oriented to person, place, and time. He appears well-developed and well-nourished.    The patient is pleasant and alert and seems calmer today than usual.  HENT:  Head: Normocephalic and atraumatic.  Right Ear: External ear normal.  Left Ear: External ear normal.  Nose: Nose normal.  Mouth/Throat: Oropharynx is clear and moist. No oropharyngeal exudate.  The throat was normal in appearance and no  cyst or anything else were seen on the exam today. He was reassured about this.  Eyes: Conjunctivae and EOM are normal. Pupils are equal, round, and reactive to light. Right eye exhibits no discharge. Left eye exhibits no discharge. No scleral icterus.  Neck: Normal range of motion. Neck supple. No thyromegaly present.  No bruits or thyromegaly or anterior cervical adenopathy  Cardiovascular: Normal rate, regular rhythm, normal heart sounds and intact distal pulses.   No murmur heard. The heart is 84/m with a regular rate and rhythm  Pulmonary/Chest: Effort normal and breath sounds normal. No respiratory distress. He has no wheezes. He has no rales. He exhibits no tenderness.  Clear anteriorly and posteriorly no axillary adenopathy  Abdominal: Soft. Bowel sounds are normal. He exhibits no mass. There is no tenderness. There is no rebound and no guarding.  No abdominal tenderness masses bruits or inguinal adenopathy  Musculoskeletal: Normal range of motion. He exhibits no edema.  Lymphadenopathy:    He has no cervical adenopathy.  Neurological: He is alert and oriented to person, place, and time. He has normal reflexes. No cranial nerve deficit.  Skin: Skin is warm and dry. No rash noted.  Psychiatric: He has a normal mood and affect. His behavior is normal. Judgment and thought content normal.  Nursing note and vitals reviewed.  BP 115/72 (BP Location: Left Arm)   Pulse 82   Temp 97.5 F (36.4 C) (Oral)   Ht 5\' 6"  (1.676 m)   Wt 184 lb (83.5 kg)   BMI 29.70 kg/m         Assessment & Plan:  1. Hyperthyroidism -Continue follow-up with endocrinology and ophthalmology  at [facility]  2. Hypertension -The blood pressure is good today and his home readings that he brings in were good and these will be scanned into the record.  3. Vitamin D deficiency -Continue current vitamin D replacement  4. Elevated PSA -Continue follow-up on this problem as needed.  5. Graves disease -Continue to follow-up with endocrinology and ophthalmology  Patient Instructions                       Medicare Annual Wellness Visit  [facility] and the medical providers at [facility] strive to bring you the best medical care.  In doing so we not only want to address your current medical conditions and concerns but also to detect new conditions early and prevent illness, disease and health-related problems.    Medicare offers a yearly Wellness Visit  which allows our clinical staff to assess your need for preventative services including immunizations, lifestyle education, counseling to decrease risk of preventable diseases and screening for fall risk and other medical concerns.    This visit is provided free of charge (no copay) for all Medicare recipients. The clinical pharmacists at [facility] have begun to conduct these Wellness Visits which will also include a thorough review of all your medications.    As you primary medical provider recommend that you make an appointment for your Annual Wellness Visit if you have not done so already this year.  You may set up this appointment before you leave today or you may call back ([phone]) and schedule an appointment.  Please make sure when you call that you mention that you are scheduling your Annual Wellness Visit with the clinical pharmacist so that the appointment may be made for the proper length of time.     Continue current medications. Continue good therapeutic lifestyle changes which include good diet and exercise. Fall precautions discussed with patient. If  an FOBT was given today- please return it to our front desk. If you are over 103 years old - you may need Prevnar 9 or the adult Pneumonia vaccine.  **Flu shots are available--- please call and schedule a FLU-CLINIC appointment**  After your visit with [place] today you will receive a survey in the mail or online from [employer] regarding your care with [place]. Please take a moment to fill this out. Your feedback is very important to [place] as you can help [place] better understand your patient needs as well as improve your experience and satisfaction. WE CARE ABOUT YOU!!!  Follow-up with ophthalmology and endocrinology as planned Try to reduce caffeine intake some If the heart irregularity continues with exercise even though you're not having any symptoms with this and let us know and we will do a monitor to see what kind of arrhythmia or you could be having with this.   Arrie Senate MD

## 2016-11-10 DIAGNOSIS — R7611 Nonspecific reaction to tuberculin skin test without active tuberculosis: Secondary | ICD-10-CM | POA: Diagnosis not present

## 2016-11-10 DIAGNOSIS — H5021 Vertical strabismus, right eye: Secondary | ICD-10-CM | POA: Diagnosis not present

## 2016-11-10 DIAGNOSIS — H05242 Constant exophthalmos, left eye: Secondary | ICD-10-CM | POA: Diagnosis not present

## 2016-11-10 DIAGNOSIS — H532 Diplopia: Secondary | ICD-10-CM | POA: Diagnosis not present

## 2016-11-10 DIAGNOSIS — E0500 Thyrotoxicosis with diffuse goiter without thyrotoxic crisis or storm: Secondary | ICD-10-CM | POA: Diagnosis not present

## 2016-11-21 ENCOUNTER — Other Ambulatory Visit: Payer: Medicare HMO

## 2016-11-21 DIAGNOSIS — Z1211 Encounter for screening for malignant neoplasm of colon: Secondary | ICD-10-CM

## 2016-11-23 LAB — FECAL OCCULT BLOOD, IMMUNOCHEMICAL: FECAL OCCULT BLD: NEGATIVE

## 2016-12-07 ENCOUNTER — Other Ambulatory Visit: Payer: Self-pay | Admitting: Family Medicine

## 2016-12-14 ENCOUNTER — Ambulatory Visit (INDEPENDENT_AMBULATORY_CARE_PROVIDER_SITE_OTHER): Payer: Medicare HMO | Admitting: Family Medicine

## 2016-12-14 ENCOUNTER — Encounter: Payer: Self-pay | Admitting: Family Medicine

## 2016-12-14 VITALS — BP 147/79 | HR 79 | Temp 97.0°F | Ht 66.0 in | Wt 180.4 lb

## 2016-12-14 DIAGNOSIS — E0500 Thyrotoxicosis with diffuse goiter without thyrotoxic crisis or storm: Secondary | ICD-10-CM

## 2016-12-14 DIAGNOSIS — W57XXXA Bitten or stung by nonvenomous insect and other nonvenomous arthropods, initial encounter: Secondary | ICD-10-CM

## 2016-12-14 DIAGNOSIS — J0110 Acute frontal sinusitis, unspecified: Secondary | ICD-10-CM | POA: Diagnosis not present

## 2016-12-14 MED ORDER — METOPROLOL TARTRATE 25 MG PO TABS: 25.0000 mg | ORAL_TABLET | Freq: Two times a day (BID) | ORAL | 3 refills | Status: DC

## 2016-12-14 MED ORDER — DOXYCYCLINE HYCLATE 100 MG PO TABS: 100.0000 mg | ORAL_TABLET | Freq: Two times a day (BID) | ORAL | 0 refills | Status: DC

## 2016-12-14 NOTE — Progress Notes (Signed)
   HPI  Patient presents today here with acute illness.  Patient has had facial pain and pressure for about 2-3 weeks. This is been going on off and on. He believes he has a sinus infection.  Patient found a large circular rash in his left legthat he believes was due to a tick bite. He states that he had [facility] spotted fever a few years ago and would like to be checked. He denies any fever or chills or sweats except for the low symptoms.  Return 3 months he states that he's had episodes that last 30 minutes to 4 hours of flushing, racing heart, nausea. He states that the symptoms start in his neck and go upwards across his face with severe flushing. At times they only last 30 minutes, however most the time the last 3-4 hours. Is a history of Graves' disease.  PMH: Smoking status noted ROS: Per HPI  Objective: BP (!) 147/79   Pulse 79   Temp (!) 97 F (36.1 C) (Oral)   Ht 5\' 6"  (1.676 m)   Wt 180 lb 6.4 oz (81.8 kg)   BMI 29.12 kg/m  Gen: NAD, alert, cooperative with exam HEENT: NCAT, tenderness to palpation of bilateral maxillary sinuses, nares with swelling and erythema. CV: RRR, good S1/S2, no murmur Resp: CTABL, no wheezes, non-labored Ext: No edema, warm Neuro: Alert and oriented, No gross deficits  Assessment and plan:  # acute frontal sinusitis Treat with doxy   # Tick bite Previous Hx of RMSF, No clear symptoms today Checking labs, doxy.   # Graves I believe his symptoms are related to temporal episodes of elelvated thyroid Labs, follow up with endo is scheduled in only a few weeks Start BB.  No change in methimazole, patient is interested in radioactive iodine.     Orders Placed This Encounter  Procedures  . TSH  . Lyme Ab/Western Blot Reflex  . [place] spotted fvr abs pnl(IgG+IgM)  . T4, Free  . T3, Free    Meds ordered this encounter  Medications  . metoprolol tartrate (LOPRESSOR) 25 MG tablet    Sig: Take 1 tablet (25 mg total) by  mouth 2 (two) times daily.    Dispense:  180 tablet    Refill:  3  . doxycycline (VIBRA-TABS) 100 MG tablet    Sig: Take 1 tablet (100 mg total) by mouth 2 (two) times daily. 1 po bid    Dispense:  20 tablet    Refill:  0    Laroy Apple, MD [facility] 12/14/2016, 3:32 PM

## 2016-12-14 NOTE — Patient Instructions (Signed)
Great to see you!  For sinusitis- Take doxycycline   FOr the tick bite, we are checking labs  For your thryroid- we are getting labs, I am also starting you on metoprolol to help control some of the racing heart and periodic sensation you are getting.

## 2016-12-16 LAB — T4, FREE: Free T4: 1.28 ng/dL (ref 0.82–1.77)

## 2016-12-16 LAB — ROCKY MTN SPOTTED FVR ABS PNL(IGG+IGM)
RMSF IgG: POSITIVE — AB
RMSF IgM: 0.18 {index} (ref 0.00–0.89)

## 2016-12-16 LAB — T3, FREE: T3 FREE: 3.3 pg/mL (ref 2.0–4.4)

## 2016-12-16 LAB — TSH: TSH: 5.31 u[IU]/mL — ABNORMAL HIGH (ref 0.450–4.500)

## 2016-12-16 LAB — LYME AB/WESTERN BLOT REFLEX: LYME DISEASE AB, QUANT, IGM: <0.8 index (ref 0.00–0.79)

## 2016-12-16 LAB — RMSF, IGG, IFA

## 2016-12-27 DIAGNOSIS — E0500 Thyrotoxicosis with diffuse goiter without thyrotoxic crisis or storm: Secondary | ICD-10-CM | POA: Diagnosis not present

## 2017-01-23 ENCOUNTER — Other Ambulatory Visit: Payer: Self-pay | Admitting: Family Medicine

## 2017-02-23 ENCOUNTER — Other Ambulatory Visit: Payer: Medicare HMO

## 2017-02-23 DIAGNOSIS — E7800 Pure hypercholesterolemia, unspecified: Secondary | ICD-10-CM | POA: Diagnosis not present

## 2017-02-23 DIAGNOSIS — I10 Essential (primary) hypertension: Secondary | ICD-10-CM | POA: Diagnosis not present

## 2017-02-23 DIAGNOSIS — E0590 Thyrotoxicosis, unspecified without thyrotoxic crisis or storm: Secondary | ICD-10-CM

## 2017-02-23 DIAGNOSIS — E559 Vitamin D deficiency, unspecified: Secondary | ICD-10-CM | POA: Diagnosis not present

## 2017-02-24 LAB — CBC WITH DIFFERENTIAL/PLATELET
BASOS: 0 %
Basophils Absolute: 0 10*3/uL (ref 0.0–0.2)
EOS (ABSOLUTE): 0.2 10*3/uL (ref 0.0–0.4)
EOS: 3 %
HEMATOCRIT: 41 % (ref 37.5–51.0)
Hemoglobin: 14.1 g/dL (ref 13.0–17.7)
Immature Grans (Abs): 0 10*3/uL (ref 0.0–0.1)
Immature Granulocytes: 0 %
LYMPHS ABS: 2.2 10*3/uL (ref 0.7–3.1)
Lymphs: 35 %
MCH: 36.2 pg — AB (ref 26.6–33.0)
MCHC: 34.4 g/dL (ref 31.5–35.7)
MCV: 105 fL — AB (ref 79–97)
MONOS ABS: 0.7 10*3/uL (ref 0.1–0.9)
Monocytes: 11 %
NEUTROS ABS: 3.2 10*3/uL (ref 1.4–7.0)
Neutrophils: 51 %
Platelets: 156 10*3/uL (ref 150–379)
RBC: 3.9 x10E6/uL — ABNORMAL LOW (ref 4.14–5.80)
RDW: 15.1 % (ref 12.3–15.4)
WBC: 6.2 10*3/uL (ref 3.4–10.8)

## 2017-02-24 LAB — LIPID PANEL
CHOL/HDL RATIO: 3.3 ratio (ref 0.0–5.0)
Cholesterol, Total: 120 mg/dL (ref 100–199)
HDL: 36 mg/dL — AB (ref >39)
LDL Calculated: 67 mg/dL (ref 0–99)
Triglycerides: 83 mg/dL (ref 0–149)
VLDL Cholesterol Cal: 17 mg/dL (ref 5–40)

## 2017-02-24 LAB — HEPATIC FUNCTION PANEL
ALT: 10 IU/L (ref 0–44)
AST: 18 IU/L (ref 0–40)
Albumin: 4.4 g/dL (ref 3.5–4.8)
Alkaline Phosphatase: 60 IU/L (ref 39–117)
BILIRUBIN TOTAL: 0.6 mg/dL (ref 0.0–1.2)
Bilirubin, Direct: 0.17 mg/dL (ref 0.00–0.40)
Total Protein: 6.6 g/dL (ref 6.0–8.5)

## 2017-02-24 LAB — VITAMIN D 25 HYDROXY (VIT D DEFICIENCY, FRACTURES): Vit D, 25-Hydroxy: 49.1 ng/mL (ref 30.0–100.0)

## 2017-02-24 LAB — BMP8+EGFR
BUN / CREAT RATIO: 14 (ref 10–24)
BUN: 18 mg/dL (ref 8–27)
CO2: 27 mmol/L (ref 20–29)
CREATININE: 1.25 mg/dL (ref 0.76–1.27)
Calcium: 9.2 mg/dL (ref 8.6–10.2)
Chloride: 104 mmol/L (ref 96–106)
GFR calc Af Amer: 64 mL/min/{1.73_m2} (ref >59)
GFR, EST NON AFRICAN AMERICAN: 55 mL/min/{1.73_m2} — AB (ref >59)
Glucose: 96 mg/dL (ref 65–99)
Potassium: 4.1 mmol/L (ref 3.5–5.2)
SODIUM: 141 mmol/L (ref 134–144)

## 2017-02-24 LAB — THYROID PANEL WITH TSH
FREE THYROXINE INDEX: 2 (ref 1.2–4.9)
T3 UPTAKE RATIO: 27 % (ref 24–39)
T4, Total: 7.4 ug/dL (ref 4.5–12.0)
TSH: 3.78 u[IU]/mL (ref 0.450–4.500)

## 2017-03-01 ENCOUNTER — Ambulatory Visit: Payer: Medicare HMO | Admitting: Family Medicine

## 2017-03-01 ENCOUNTER — Telehealth: Payer: Self-pay | Admitting: Family Medicine

## 2017-03-09 ENCOUNTER — Encounter: Payer: Self-pay | Admitting: Family Medicine

## 2017-03-09 ENCOUNTER — Ambulatory Visit (INDEPENDENT_AMBULATORY_CARE_PROVIDER_SITE_OTHER): Payer: Medicare HMO | Admitting: Family Medicine

## 2017-03-09 VITALS — BP 118/58 | HR 85 | Temp 97.0°F | Ht 66.0 in | Wt 174.0 lb

## 2017-03-09 DIAGNOSIS — I10 Essential (primary) hypertension: Secondary | ICD-10-CM

## 2017-03-09 DIAGNOSIS — R51 Headache: Secondary | ICD-10-CM | POA: Diagnosis not present

## 2017-03-09 DIAGNOSIS — R251 Tremor, unspecified: Secondary | ICD-10-CM | POA: Diagnosis not present

## 2017-03-09 DIAGNOSIS — R0981 Nasal congestion: Secondary | ICD-10-CM | POA: Diagnosis not present

## 2017-03-09 DIAGNOSIS — R531 Weakness: Secondary | ICD-10-CM | POA: Diagnosis not present

## 2017-03-09 DIAGNOSIS — R519 Headache, unspecified: Secondary | ICD-10-CM

## 2017-03-09 DIAGNOSIS — E0500 Thyrotoxicosis with diffuse goiter without thyrotoxic crisis or storm: Secondary | ICD-10-CM | POA: Diagnosis not present

## 2017-03-09 DIAGNOSIS — R42 Dizziness and giddiness: Secondary | ICD-10-CM

## 2017-03-09 DIAGNOSIS — Z23 Encounter for immunization: Secondary | ICD-10-CM

## 2017-03-09 LAB — CBC WITH DIFFERENTIAL/PLATELET
BASOS ABS: 0 10*3/uL (ref 0.0–0.2)
Basos: 1 %
EOS (ABSOLUTE): 0.1 10*3/uL (ref 0.0–0.4)
Eos: 2 %
HEMATOCRIT: 41.6 % (ref 37.5–51.0)
HEMOGLOBIN: 14.6 g/dL (ref 13.0–17.7)
IMMATURE GRANS (ABS): 0 10*3/uL (ref 0.0–0.1)
Immature Granulocytes: 0 %
LYMPHS ABS: 1.6 10*3/uL (ref 0.7–3.1)
Lymphs: 36 %
MCH: 36.6 pg — ABNORMAL HIGH (ref 26.6–33.0)
MCHC: 35.1 g/dL (ref 31.5–35.7)
MCV: 104 fL — ABNORMAL HIGH (ref 79–97)
MONOS ABS: 0.5 10*3/uL (ref 0.1–0.9)
Monocytes: 12 %
Neutrophils Absolute: 2.2 10*3/uL (ref 1.4–7.0)
Neutrophils: 49 %
Platelets: 158 10*3/uL (ref 150–379)
RBC: 3.99 x10E6/uL — ABNORMAL LOW (ref 4.14–5.80)
RDW: 15.2 % (ref 12.3–15.4)
WBC: 4.4 10*3/uL (ref 3.4–10.8)

## 2017-03-09 LAB — BMP8+EGFR
BUN/Creatinine Ratio: 13 (ref 10–24)
BUN: 14 mg/dL (ref 8–27)
CO2: 27 mmol/L (ref 20–29)
CREATININE: 1.05 mg/dL (ref 0.76–1.27)
Calcium: 9.4 mg/dL (ref 8.6–10.2)
Chloride: 102 mmol/L (ref 96–106)
GFR calc Af Amer: 79 mL/min/{1.73_m2} (ref >59)
GFR, EST NON AFRICAN AMERICAN: 68 mL/min/{1.73_m2} (ref >59)
Glucose: 96 mg/dL (ref 65–99)
Potassium: 3.9 mmol/L (ref 3.5–5.2)
SODIUM: 142 mmol/L (ref 134–144)

## 2017-03-09 NOTE — Addendum Note (Signed)
Addended by: Zannie Cove on: 03/09/2017 09:24 AM   Modules accepted: Orders

## 2017-03-09 NOTE — Progress Notes (Signed)
Subjective:    Patient ID: Mario Smith, male    DOB: 11-23-1940, 76 y.o.   MRN: [id number]  HPI Patient here today for weakness, nausea and feels light headed at times. The patient today complains of some weakness nausea lightheadedness tremor and headache. He has an ongoing history of Graves' disease hypertension hyperlipidemia and vitamin D deficiency. He also has a history of an elevated PSA. The family history is positive for heart disease and COPD and prostate cancer. He continues to be followed by the endocrinologist at [facility] for his Berenice Primas' disease. The patient brings in blood pressures from the outside and the majority of these are good with systolics ranging from 858 to 8:50 and diastolics from 27-74. The patient plans to see his endocrinologist an ophthalmologist in mid November. He feels like a lot of his problems are coming from the head congestion and drainage that he is having into his throat and chest. He has not been running a fever. He does have some nausea. His tremor seems to be more apparent today and he seems to be having more trouble hearing today. I am concerned about his change which seems to be more anxious and his nervousness and lack of hearing. There is no family history of Parkinson's.     Patient Active Problem List   Diagnosis Date Noted  . Graves disease 10/27/2016  . Elevated PSA 10/27/2016  . Thrombocytopenia ([place]) 06/25/2016  . Hyperthyroidism 02/24/2016  . Hyperlipidemia 05/27/2013  . Vitamin D deficiency 05/27/2013  . Hypertension 02/11/2010   Outpatient Encounter Prescriptions as of 03/09/2017  Medication Sig  . aspirin 81 MG EC tablet Take 81 mg by mouth daily.    . Carboxymethylcellulose Sodium (REFRESH TEARS OP) Apply 1 drop to eye daily as needed (dry eyes).   . Cholecalciferol (VITAMIN D3) 2000 UNITS TABS Take 1 tablet by mouth daily.    . Coenzyme Q10 (CO Q 10 PO) Take 2 capsules by mouth daily.   .  fluticasone (FLONASE) 50 MCG/ACT nasal spray USE TWO SPRAY(S) IN EACH NOSTRIL ONCE DAILY  . hydrochlorothiazide (MICROZIDE) 12.5 MG capsule TAKE 1 CAPSULE BY MOUTH ONCE DAILY  . losartan (COZAAR) 100 MG tablet TAKE ONE TABLET BY MOUTH ONCE DAILY  . methimazole (TAPAZOLE) 10 MG tablet Take 10 mg by mouth daily.  . metoprolol tartrate (LOPRESSOR) 25 MG tablet Take 1 tablet (25 mg total) by mouth 2 (two) times daily.  . Omega-3 Fatty Acids (FISH OIL) 1000 MG CAPS Take 1 capsule by mouth 2 (two) times daily.    . rosuvastatin (CRESTOR) 20 MG tablet TAKE ONE TABLET BY MOUTH ONCE DAILY  . Selenium 100 MCG TABS Take 1 tablet by mouth daily.   Marland Kitchen UNABLE TO FIND Med Name: VIT B-6 - 25 mg - one a day  . [DISCONTINUED] doxycycline (VIBRA-TABS) 100 MG tablet Take 1 tablet (100 mg total) by mouth 2 (two) times daily. 1 po bid   No facility-administered encounter medications on file as of 03/09/2017.       Review of Systems  Constitutional: Positive for fatigue.  HENT: Negative.   Eyes: Negative.   Respiratory: Negative.   Cardiovascular: Negative.   Gastrointestinal: Positive for nausea (no vomiting).  Endocrine: Negative.   Genitourinary: Negative.   Musculoskeletal: Negative.   Skin: Negative.   Allergic/Immunologic: Negative.   Neurological: Positive for tremors, weakness, light-headedness and headaches.  Hematological: Negative.   Psychiatric/Behavioral: Negative.  Objective:   Physical Exam  Constitutional: He is oriented to person, place, and time. He appears well-developed and well-nourished. No distress.  The patient is pleasant and alert and somewhat anxious about everything that is going on.  HENT:  Head: Normocephalic and atraumatic.  Right Ear: External ear normal.  Left Ear: External ear normal.  Mouth/Throat: Oropharynx is clear and moist. No oropharyngeal exudate.  Nasal congestion bilaterally with no sinus tenderness to pressure or palpation  Eyes: Pupils are equal,  round, and reactive to light. Conjunctivae and EOM are normal. Right eye exhibits no discharge. Left eye exhibits no discharge. No scleral icterus.  Neck: Normal range of motion. Neck supple. No thyromegaly present.  No bruits thyromegaly or anterior cervical adenopathy  Cardiovascular: Normal rate, regular rhythm, normal heart sounds and intact distal pulses.   No murmur heard. The heart is regular at 72/m  Pulmonary/Chest: Effort normal and breath sounds normal. No respiratory distress. He has no wheezes. He has no rales. He exhibits no tenderness.  No axillary adenopathy and chest is clear anteriorly and posteriorly  Abdominal: Soft. Bowel sounds are normal. He exhibits no mass. There is no tenderness. There is no rebound and no guarding.  No abdominal tenderness masses or bruits.  Musculoskeletal: Normal range of motion. He exhibits no edema or tenderness.  Lymphadenopathy:    He has no cervical adenopathy.  Neurological: He is alert and oriented to person, place, and time. He has normal reflexes. No cranial nerve deficit.  Skin: Skin is warm and dry. No rash noted.  Psychiatric: He has a normal mood and affect. His behavior is normal. Judgment and thought content normal.  Nursing note and vitals reviewed.   BP (!) 118/58 (BP Location: Left Arm)   Pulse 85   Temp (!) 97 F (36.1 C) (Oral)   Ht 5\' 6"  (1.676 m)   Wt 174 lb (78.9 kg)   BMI 28.08 kg/m        Assessment & Plan:  1. Tremor of right hand - appointment with neurology  2. Sinus congestion - CT of sinuses  3. Graves disease - follow-up with endocrinology  4. Hypertension - continue current trement  Patient Instructions   We will arrange for you to have an appoinment with the neurologist for further evaluation of your tremor in light of your Graves' disease  we will also schedule you for an evaluation of your sinuses with a possible CT scan to further evaluate this. We will call your blod work rsultsas soon as  these become available Take zanta c one twice daily before breakfast and supper  continue with nasal saline  Arrie Senate MD

## 2017-03-09 NOTE — Patient Instructions (Addendum)
We will arrange for you to have an appoinment with the neurologist for further evaluation of your tremor in light of your Graves' disease  we will also schedule you for an evaluation of your sinuses with a possible CT scan to further evaluate this. We will call your blod work rsultsas soon as these become available Take zanta c one twice daily before breakfast and supper  continue with nasal saline

## 2017-03-10 ENCOUNTER — Encounter: Payer: Self-pay | Admitting: Neurology

## 2017-03-10 ENCOUNTER — Other Ambulatory Visit: Payer: Self-pay | Admitting: Family Medicine

## 2017-03-17 ENCOUNTER — Ambulatory Visit
Admission: RE | Admit: 2017-03-17 | Discharge: 2017-03-17 | Disposition: A | Discharge status: Home / Self Care | Payer: Medicare HMO | Source: Ambulatory Visit | Attending: Family Medicine | Admitting: Family Medicine

## 2017-03-17 DIAGNOSIS — R519 Headache, unspecified: Secondary | ICD-10-CM

## 2017-03-17 DIAGNOSIS — R0981 Nasal congestion: Secondary | ICD-10-CM

## 2017-03-17 DIAGNOSIS — E0500 Thyrotoxicosis with diffuse goiter without thyrotoxic crisis or storm: Secondary | ICD-10-CM

## 2017-03-17 DIAGNOSIS — J329 Chronic sinusitis, unspecified: Secondary | ICD-10-CM | POA: Diagnosis not present

## 2017-03-17 DIAGNOSIS — R51 Headache: Principal | ICD-10-CM

## 2017-03-17 DIAGNOSIS — R531 Weakness: Secondary | ICD-10-CM

## 2017-03-17 DIAGNOSIS — R251 Tremor, unspecified: Secondary | ICD-10-CM

## 2017-03-17 DIAGNOSIS — R42 Dizziness and giddiness: Secondary | ICD-10-CM

## 2017-03-22 ENCOUNTER — Telehealth: Payer: Self-pay

## 2017-03-22 NOTE — Telephone Encounter (Signed)
Gave message to [facility] _ he attempted to call himself

## 2017-03-22 NOTE — Telephone Encounter (Signed)
Wife is wanting to speak to [facility] about CT results that Dr. Laurance Flatten was supposed to receive.

## 2017-04-05 DIAGNOSIS — H02536 Eyelid retraction left eye, unspecified eyelid: Secondary | ICD-10-CM | POA: Diagnosis not present

## 2017-04-05 DIAGNOSIS — H5021 Vertical strabismus, right eye: Secondary | ICD-10-CM | POA: Diagnosis not present

## 2017-04-05 DIAGNOSIS — Z961 Presence of intraocular lens: Secondary | ICD-10-CM | POA: Diagnosis not present

## 2017-04-05 DIAGNOSIS — H532 Diplopia: Secondary | ICD-10-CM | POA: Diagnosis not present

## 2017-04-05 DIAGNOSIS — E0500 Thyrotoxicosis with diffuse goiter without thyrotoxic crisis or storm: Secondary | ICD-10-CM | POA: Diagnosis not present

## 2017-04-05 DIAGNOSIS — H05242 Constant exophthalmos, left eye: Secondary | ICD-10-CM | POA: Diagnosis not present

## 2017-04-05 DIAGNOSIS — H02534 Eyelid retraction left upper eyelid: Secondary | ICD-10-CM | POA: Diagnosis not present

## 2017-04-17 ENCOUNTER — Encounter: Payer: Self-pay | Admitting: Family Medicine

## 2017-04-17 ENCOUNTER — Ambulatory Visit: Payer: Medicare Other | Admitting: Neurology

## 2017-04-17 ENCOUNTER — Ambulatory Visit (INDEPENDENT_AMBULATORY_CARE_PROVIDER_SITE_OTHER): Payer: Medicare HMO | Admitting: Family Medicine

## 2017-04-17 VITALS — BP 123/71 | HR 83 | Temp 97.8°F | Ht 67.5 in | Wt 175.2 lb

## 2017-04-17 DIAGNOSIS — J0101 Acute recurrent maxillary sinusitis: Secondary | ICD-10-CM | POA: Diagnosis not present

## 2017-04-17 MED ORDER — AMOXICILLIN-POT CLAVULANATE 875-125 MG PO TABS: 1.0000 | ORAL_TABLET | Freq: Two times a day (BID) | ORAL | 0 refills | Status: DC

## 2017-04-17 NOTE — Progress Notes (Signed)
   HPI  Patient presents today with cough and sinus pressure.  Patient explains he has had sinus pressure for a few weeks.  He did not seem to improve completely after his previous treatment with doxycycline for sinus infection.  He states that he had a difficult time tolerating doxycycline and had to stop short of the full course.  He complains of cough with upper central chest pain with cough.  He also has mild shortness of breath. He denies fever, chills, sweats, and body aches. He states that his face has felt very hot like he had a fever  He is tolerating food and fluids like usual  PMH: Smoking status noted ROS: Per HPI  Objective: BP 123/71   Pulse 83   Temp 97.8 F (36.6 C) (Oral)   Ht 5' 7.5" (1.715 m)   Wt 175 lb 3.2 oz (79.5 kg)   SpO2 99%   BMI 27.04 kg/m  Gen: NAD, alert, cooperative with exam HEENT: NCAT, tenderness to palpation of bilateral maxillary sinuses, TMs normal bilaterally, oropharynx moist and clear, patient with cloudy glasses on the right lens due to Graves' disease treatment CV: RRR, good S1/S2, no murmur Resp: CTABL, no wheezes, non-labored Ext: No edema, warm Neuro: Alert and oriented, No gross deficits  Assessment and plan:  #Acute sinusitis With developing cough that could be developing pneumonia. Treat with Augmentin. Discussed if not tolerating medication at this time please call.   Meds ordered this encounter  Medications  . amoxicillin-clavulanate (AUGMENTIN) 875-125 MG tablet    Sig: Take 1 tablet 2 (two) times daily by mouth.    Dispense:  20 tablet    Refill:  0    Laroy Apple, MD [facility] 04/17/2017, 11:26 AM

## 2017-04-17 NOTE — Patient Instructions (Signed)
Great to see you!   Sinusitis, Adult Sinusitis is soreness and inflammation of your sinuses. Sinuses are hollow spaces in the bones around your face. They are located:  Around your eyes.  In the middle of your forehead.  Behind your nose.  In your cheekbones.  Your sinuses and nasal passages are lined with a stringy fluid (mucus). Mucus normally drains out of your sinuses. When your nasal tissues get inflamed or swollen, the mucus can get trapped or blocked so air cannot flow through your sinuses. This lets bacteria, viruses, and funguses grow, and that leads to infection. Follow these instructions at home: Medicines  Take, use, or apply over-the-counter and prescription medicines only as told by your doctor. These may include nasal sprays.  If you were prescribed an antibiotic medicine, take it as told by your doctor. Do not stop taking the antibiotic even if you start to feel better. Hydrate and Humidify  Drink enough water to keep your pee (urine) clear or pale yellow.  Use a cool mist humidifier to keep the humidity level in your home above 50%.  Breathe in steam for 10-15 minutes, 3-4 times a day or as told by your doctor. You can do this in the bathroom while a hot shower is running.  Try not to spend time in cool or dry air. Rest  Rest as much as possible.  Sleep with your head raised (elevated).  Make sure to get enough sleep each night. General instructions  Put a warm, moist washcloth on your face 3-4 times a day or as told by your doctor. This will help with discomfort.  Wash your hands often with soap and water. If there is no soap and water, use hand sanitizer.  Do not smoke. Avoid being around people who are smoking (secondhand smoke).  Keep all follow-up visits as told by your doctor. This is important. Contact a doctor if:  You have a fever.  Your symptoms get worse.  Your symptoms do not get better within 10 days. Get help right away if:  You  have a very bad headache.  You cannot stop throwing up (vomiting).  You have pain or swelling around your face or eyes.  You have trouble seeing.  You feel confused.  Your neck is stiff.  You have trouble breathing. This information is not intended to replace advice given to you by your health care provider. Make sure you discuss any questions you have with your health care provider. Document Released: 11/09/2007 Document Revised: 01/17/2016 Document Reviewed: 03/18/2015 Elsevier Interactive Patient Education  2018 Elsevier Inc.  

## 2017-04-25 ENCOUNTER — Telehealth: Payer: Self-pay | Admitting: Family Medicine

## 2017-04-25 ENCOUNTER — Other Ambulatory Visit: Payer: Self-pay | Admitting: Family Medicine

## 2017-04-25 MED ORDER — PREDNISONE 20 MG PO TABS: 40.0000 mg | ORAL_TABLET | Freq: Every day | ORAL | 0 refills | Status: DC

## 2017-04-25 NOTE — Telephone Encounter (Signed)
Wife aware

## 2017-04-25 NOTE — Telephone Encounter (Signed)
I have sent a short course of prednisone to try to help with his cough.   This should be taken earlier in the day as it can interfere with sleep.   Laroy Apple, MD [facility] 04/25/2017, 11:52 AM

## 2017-04-25 NOTE — Telephone Encounter (Signed)
Spoke with wife, she said Mario Smith is still experiencing cough, slightly productive, chest does not appear to be congested.  He is still on antibiotic.  Would like to know if there is anything else he could add that may help with his cough.

## 2017-05-02 ENCOUNTER — Telehealth: Payer: Self-pay | Admitting: Family Medicine

## 2017-05-02 ENCOUNTER — Ambulatory Visit: Payer: Self-pay | Admitting: Nurse Practitioner

## 2017-05-03 ENCOUNTER — Encounter: Payer: Self-pay | Admitting: Family Medicine

## 2017-05-03 ENCOUNTER — Ambulatory Visit (INDEPENDENT_AMBULATORY_CARE_PROVIDER_SITE_OTHER): Payer: Medicare HMO | Admitting: Family Medicine

## 2017-05-03 ENCOUNTER — Ambulatory Visit (INDEPENDENT_AMBULATORY_CARE_PROVIDER_SITE_OTHER): Payer: Medicare HMO

## 2017-05-03 VITALS — BP 134/75 | HR 66 | Temp 97.1°F | Ht 67.5 in | Wt 177.8 lb

## 2017-05-03 DIAGNOSIS — R05 Cough: Secondary | ICD-10-CM

## 2017-05-03 DIAGNOSIS — R059 Cough, unspecified: Secondary | ICD-10-CM

## 2017-05-03 DIAGNOSIS — E0590 Thyrotoxicosis, unspecified without thyrotoxic crisis or storm: Secondary | ICD-10-CM

## 2017-05-03 MED ORDER — BUDESONIDE-FORMOTEROL FUMARATE 80-4.5 MCG/ACT IN AERO: 2.0000 | INHALATION_SPRAY | Freq: Two times a day (BID) | RESPIRATORY_TRACT | 3 refills | Status: DC

## 2017-05-03 MED ORDER — LEVOFLOXACIN 750 MG PO TABS: 750.0000 mg | ORAL_TABLET | Freq: Every day | ORAL | 0 refills | Status: DC

## 2017-05-03 NOTE — Progress Notes (Signed)
   HPI  Patient presents today here for continued cough.  Patient explains he has been ill now about 3 months.  He complains of cough, congestion, headache, and shortness of breath.  He has mild transient improvement with albuterol but states that this causes him to feel very nervous as well.  Patient has Graves' disease, he would like his TSH checked today.   PMH: Smoking status noted ROS: Per HPI  Objective: BP 134/75   Pulse 66   Temp (!) 97.1 F (36.2 C) (Oral)   Ht 5' 7.5" (1.715 m)   Wt 177 lb 12.8 oz (80.6 kg)   SpO2 96%   BMI 27.44 kg/m  Gen: NAD, alert, cooperative with exam HEENT: NCAT, oropharynx moist and clear, nares with what appears to be small deviated septum on the visible in the right side, TMs normal bilaterally CV: RRR Resp: CTABL, no wheezes, non-labored Ext: No edema, warm Neuro: Alert and oriented, No gross deficits  Assessment and plan:  #Cough Unusual cough, patient now feeling ill for about 2 months, he also has malaise which makes me think it could be infectious.  He had transient improvement with Augmentin and then return to his present state. X-ray Levaquin No significant improvement with prednisone He has had some improvement with albuterol nebulizer given him 4 weeks of samples for Symbicort. Follow-up 3-4 weeks  Hyperthyroidism Graves' disease, repeat TSH today    Orders Placed This Encounter  Procedures  . DG Chest 2 View    Standing Status:   Future    Standing Expiration Date:   07/03/2018    Order Specific Question:   Reason for Exam (SYMPTOM  OR DIAGNOSIS REQUIRED)    Answer:   Cough, eval for CAP    Order Specific Question:   Preferred imaging location?    Answer:   Internal    Order Specific Question:   Radiology Contrast Protocol - do NOT remove file path    Answer:   [email or web address]  . BMP8+EGFR  . CBC with Differential/Platelet  . TSH    Meds ordered this encounter    Medications  . levofloxacin (LEVAQUIN) 750 MG tablet    Sig: Take 1 tablet (750 mg total) by mouth daily.    Dispense:  7 tablet    Refill:  0  . budesonide-formoterol (SYMBICORT) 80-4.5 MCG/ACT inhaler    Sig: Inhale 2 puffs into the lungs 2 (two) times daily.    Dispense:  2 Inhaler    Refill:  [place], [facility] 05/03/2017, 9:38 AM

## 2017-05-03 NOTE — Patient Instructions (Signed)
Great to see you!  Symbicort 2 puffs twice daily for 1 month, also take Levaquin 1 pill once daily.  Please plan to come back to see Dr. Laurance Flatten in 3-4 weeks to make sure the you have gotten completely better.  We will call or send your labs within 1 week.

## 2017-05-04 LAB — CBC WITH DIFFERENTIAL/PLATELET
BASOS ABS: 0 10*3/uL (ref 0.0–0.2)
Basos: 0 %
EOS (ABSOLUTE): 0.1 10*3/uL (ref 0.0–0.4)
Eos: 2 %
HEMOGLOBIN: 13.6 g/dL (ref 13.0–17.7)
Hematocrit: 37.7 % (ref 37.5–51.0)
Immature Grans (Abs): 0 10*3/uL (ref 0.0–0.1)
Immature Granulocytes: 0 %
LYMPHS ABS: 2.2 10*3/uL (ref 0.7–3.1)
LYMPHS: 36 %
MCH: 37.6 pg — ABNORMAL HIGH (ref 26.6–33.0)
MCHC: 36.1 g/dL — AB (ref 31.5–35.7)
MCV: 104 fL — ABNORMAL HIGH (ref 79–97)
MONOCYTES: 12 %
Monocytes Absolute: 0.7 10*3/uL (ref 0.1–0.9)
Neutrophils Absolute: 3.1 10*3/uL (ref 1.4–7.0)
Neutrophils: 50 %
PLATELETS: 160 10*3/uL (ref 150–379)
RBC: 3.62 x10E6/uL — AB (ref 4.14–5.80)
RDW: 17.7 % — ABNORMAL HIGH (ref 12.3–15.4)
WBC: 6.1 10*3/uL (ref 3.4–10.8)

## 2017-05-04 LAB — BMP8+EGFR
BUN / CREAT RATIO: 13 (ref 10–24)
BUN: 15 mg/dL (ref 8–27)
CALCIUM: 9.1 mg/dL (ref 8.6–10.2)
CHLORIDE: 98 mmol/L (ref 96–106)
CO2: 31 mmol/L — ABNORMAL HIGH (ref 20–29)
Creatinine, Ser: 1.2 mg/dL (ref 0.76–1.27)
GFR calc Af Amer: 67 mL/min/{1.73_m2} (ref >59)
GFR calc non Af Amer: 58 mL/min/{1.73_m2} — ABNORMAL LOW (ref >59)
GLUCOSE: 91 mg/dL (ref 65–99)
POTASSIUM: 3.8 mmol/L (ref 3.5–5.2)
Sodium: 142 mmol/L (ref 134–144)

## 2017-05-04 LAB — TSH: TSH: 2.99 u[IU]/mL (ref 0.450–4.500)

## 2017-05-05 DIAGNOSIS — H532 Diplopia: Secondary | ICD-10-CM | POA: Diagnosis not present

## 2017-05-05 DIAGNOSIS — E0500 Thyrotoxicosis with diffuse goiter without thyrotoxic crisis or storm: Secondary | ICD-10-CM | POA: Diagnosis not present

## 2017-05-05 DIAGNOSIS — H5021 Vertical strabismus, right eye: Secondary | ICD-10-CM | POA: Diagnosis not present

## 2017-05-05 DIAGNOSIS — H5201 Hypermetropia, right eye: Secondary | ICD-10-CM | POA: Diagnosis not present

## 2017-05-05 DIAGNOSIS — H02536 Eyelid retraction left eye, unspecified eyelid: Secondary | ICD-10-CM | POA: Diagnosis not present

## 2017-05-05 DIAGNOSIS — R7611 Nonspecific reaction to tuberculin skin test without active tuberculosis: Secondary | ICD-10-CM | POA: Diagnosis not present

## 2017-05-05 DIAGNOSIS — H05242 Constant exophthalmos, left eye: Secondary | ICD-10-CM | POA: Diagnosis not present

## 2017-05-25 ENCOUNTER — Telehealth: Payer: Self-pay | Admitting: Family Medicine

## 2017-05-25 NOTE — Telephone Encounter (Signed)
Pt aware we will re-fax just in case - Dr. Nicoletta Dress

## 2017-06-11 ENCOUNTER — Other Ambulatory Visit: Payer: Self-pay | Admitting: Family Medicine

## 2017-06-21 DIAGNOSIS — E0500 Thyrotoxicosis with diffuse goiter without thyrotoxic crisis or storm: Secondary | ICD-10-CM | POA: Diagnosis not present

## 2017-06-21 DIAGNOSIS — H5010 Unspecified exotropia: Secondary | ICD-10-CM | POA: Diagnosis not present

## 2017-06-21 DIAGNOSIS — H532 Diplopia: Secondary | ICD-10-CM | POA: Diagnosis not present

## 2017-06-21 DIAGNOSIS — H5021 Vertical strabismus, right eye: Secondary | ICD-10-CM | POA: Diagnosis not present

## 2017-06-21 DIAGNOSIS — H52222 Regular astigmatism, left eye: Secondary | ICD-10-CM | POA: Insufficient documentation

## 2017-06-21 DIAGNOSIS — H5212 Myopia, left eye: Secondary | ICD-10-CM | POA: Diagnosis not present

## 2017-06-21 DIAGNOSIS — Z961 Presence of intraocular lens: Secondary | ICD-10-CM | POA: Diagnosis not present

## 2017-07-14 ENCOUNTER — Telehealth: Payer: Self-pay | Admitting: Family Medicine

## 2017-07-14 NOTE — Telephone Encounter (Signed)
Dizzy, vertigo and nausea  Woke at 3 am with this.  Pt wife got him some dramamine to try - he will try this as package insert recommends and call in the morning to be seen in the [facility] - in the morning

## 2017-07-25 ENCOUNTER — Other Ambulatory Visit: Payer: Medicare HMO

## 2017-07-25 DIAGNOSIS — E7800 Pure hypercholesterolemia, unspecified: Secondary | ICD-10-CM | POA: Diagnosis not present

## 2017-07-25 DIAGNOSIS — R972 Elevated prostate specific antigen [PSA]: Secondary | ICD-10-CM | POA: Diagnosis not present

## 2017-07-25 DIAGNOSIS — E559 Vitamin D deficiency, unspecified: Secondary | ICD-10-CM

## 2017-07-25 DIAGNOSIS — I10 Essential (primary) hypertension: Secondary | ICD-10-CM | POA: Diagnosis not present

## 2017-07-25 DIAGNOSIS — E0500 Thyrotoxicosis with diffuse goiter without thyrotoxic crisis or storm: Secondary | ICD-10-CM | POA: Diagnosis not present

## 2017-07-25 DIAGNOSIS — E0590 Thyrotoxicosis, unspecified without thyrotoxic crisis or storm: Secondary | ICD-10-CM | POA: Diagnosis not present

## 2017-07-26 LAB — HEPATIC FUNCTION PANEL
ALT: 10 IU/L (ref 0–44)
AST: 18 IU/L (ref 0–40)
Albumin: 4.4 g/dL (ref 3.5–4.8)
Alkaline Phosphatase: 63 IU/L (ref 39–117)
Bilirubin Total: 0.6 mg/dL (ref 0.0–1.2)
Bilirubin, Direct: 0.17 mg/dL (ref 0.00–0.40)
Total Protein: 6.9 g/dL (ref 6.0–8.5)

## 2017-07-26 LAB — PSA, TOTAL AND FREE
PSA, Free Pct: 18.8 %
PSA, Free: 0.32 ng/mL
Prostate Specific Ag, Serum: 1.7 ng/mL (ref 0.0–4.0)

## 2017-07-26 LAB — CBC WITH DIFFERENTIAL/PLATELET
BASOS: 1 %
Basophils Absolute: 0 10*3/uL (ref 0.0–0.2)
EOS (ABSOLUTE): 0.1 10*3/uL (ref 0.0–0.4)
Eos: 3 %
Hematocrit: 39.9 % (ref 37.5–51.0)
Hemoglobin: 13.7 g/dL (ref 13.0–17.7)
IMMATURE GRANS (ABS): 0 10*3/uL (ref 0.0–0.1)
IMMATURE GRANULOCYTES: 0 %
LYMPHS: 41 %
Lymphocytes Absolute: 1.7 10*3/uL (ref 0.7–3.1)
MCH: 35.8 pg — ABNORMAL HIGH (ref 26.6–33.0)
MCHC: 34.3 g/dL (ref 31.5–35.7)
MCV: 104 fL — ABNORMAL HIGH (ref 79–97)
Monocytes Absolute: 0.4 10*3/uL (ref 0.1–0.9)
Monocytes: 10 %
NEUTROS PCT: 45 %
Neutrophils Absolute: 1.8 10*3/uL (ref 1.4–7.0)
PLATELETS: 141 10*3/uL — AB (ref 150–379)
RBC: 3.83 x10E6/uL — AB (ref 4.14–5.80)
RDW: 15.9 % — AB (ref 12.3–15.4)
WBC: 4 10*3/uL (ref 3.4–10.8)

## 2017-07-26 LAB — BMP8+EGFR
BUN/Creatinine Ratio: 13 (ref 10–24)
BUN: 15 mg/dL (ref 8–27)
CALCIUM: 9.5 mg/dL (ref 8.6–10.2)
CO2: 27 mmol/L (ref 20–29)
Chloride: 104 mmol/L (ref 96–106)
Creatinine, Ser: 1.2 mg/dL (ref 0.76–1.27)
GFR calc non Af Amer: 58 mL/min/{1.73_m2} — ABNORMAL LOW (ref >59)
GFR, EST AFRICAN AMERICAN: 67 mL/min/{1.73_m2} (ref >59)
GLUCOSE: 94 mg/dL (ref 65–99)
POTASSIUM: 4.4 mmol/L (ref 3.5–5.2)
Sodium: 143 mmol/L (ref 134–144)

## 2017-07-26 LAB — VITAMIN D 25 HYDROXY (VIT D DEFICIENCY, FRACTURES): Vit D, 25-Hydroxy: 56.4 ng/mL (ref 30.0–100.0)

## 2017-07-26 LAB — LIPID PANEL
CHOLESTEROL TOTAL: 118 mg/dL (ref 100–199)
Chol/HDL Ratio: 3 ratio (ref 0.0–5.0)
HDL: 39 mg/dL — ABNORMAL LOW (ref >39)
LDL Calculated: 66 mg/dL (ref 0–99)
TRIGLYCERIDES: 66 mg/dL (ref 0–149)
VLDL CHOLESTEROL CAL: 13 mg/dL (ref 5–40)

## 2017-07-26 LAB — THYROID PANEL WITH TSH
FREE THYROXINE INDEX: 2.1 (ref 1.2–4.9)
T3 UPTAKE RATIO: 26 % (ref 24–39)
T4, Total: 7.9 ug/dL (ref 4.5–12.0)
TSH: 4.46 u[IU]/mL (ref 0.450–4.500)

## 2017-07-28 ENCOUNTER — Ambulatory Visit (INDEPENDENT_AMBULATORY_CARE_PROVIDER_SITE_OTHER): Payer: Medicare HMO | Admitting: Family Medicine

## 2017-07-28 ENCOUNTER — Encounter: Payer: Self-pay | Admitting: Family Medicine

## 2017-07-28 VITALS — BP 124/70 | HR 105 | Temp 96.8°F | Ht 67.5 in | Wt 173.0 lb

## 2017-07-28 DIAGNOSIS — Z800 Family history of malignant neoplasm of digestive organs: Secondary | ICD-10-CM | POA: Diagnosis not present

## 2017-07-28 DIAGNOSIS — E0500 Thyrotoxicosis with diffuse goiter without thyrotoxic crisis or storm: Secondary | ICD-10-CM

## 2017-07-28 DIAGNOSIS — R972 Elevated prostate specific antigen [PSA]: Secondary | ICD-10-CM

## 2017-07-28 DIAGNOSIS — E7800 Pure hypercholesterolemia, unspecified: Secondary | ICD-10-CM

## 2017-07-28 DIAGNOSIS — E0590 Thyrotoxicosis, unspecified without thyrotoxic crisis or storm: Secondary | ICD-10-CM | POA: Diagnosis not present

## 2017-07-28 DIAGNOSIS — Z1211 Encounter for screening for malignant neoplasm of colon: Secondary | ICD-10-CM

## 2017-07-28 DIAGNOSIS — I10 Essential (primary) hypertension: Secondary | ICD-10-CM

## 2017-07-28 DIAGNOSIS — E559 Vitamin D deficiency, unspecified: Secondary | ICD-10-CM

## 2017-07-28 LAB — URINALYSIS, COMPLETE
Bilirubin, UA: NEGATIVE
Glucose, UA: NEGATIVE
Leukocytes, UA: NEGATIVE
Nitrite, UA: NEGATIVE
PH UA: 5.5 (ref 5.0–7.5)
Urobilinogen, Ur: 1 mg/dL (ref 0.2–1.0)

## 2017-07-28 LAB — MICROSCOPIC EXAMINATION
BACTERIA UA: NONE SEEN
EPITHELIAL CELLS (NON RENAL): NONE SEEN /HPF (ref 0–10)
RENAL EPITHEL UA: NONE SEEN /HPF
WBC, UA: NONE SEEN /hpf (ref 0–?)

## 2017-07-28 MED ORDER — MUPIROCIN 2 % EX OINT: 1.0000 "application " | TOPICAL_OINTMENT | Freq: Two times a day (BID) | CUTANEOUS | 0 refills | Status: DC

## 2017-07-28 NOTE — Patient Instructions (Addendum)
Medicare Annual Wellness Visit  [facility] and the medical providers at [facility] strive to bring you the best medical care.  In doing so we not only want to address your current medical conditions and concerns but also to detect new conditions early and prevent illness, disease and health-related problems.    Medicare offers a yearly Wellness Visit which allows our clinical staff to assess your need for preventative services including immunizations, lifestyle education, counseling to decrease risk of preventable diseases and screening for fall risk and other medical concerns.    This visit is provided free of charge (no copay) for all Medicare recipients. The clinical pharmacists at [facility] have begun to conduct these Wellness Visits which will also include a thorough review of all your medications.    As you primary medical provider recommend that you make an appointment for your Annual Wellness Visit if you have not done so already this year.  You may set up this appointment before you leave today or you may call back ([phone]) and schedule an appointment.  Please make sure when you call that you mention that you are scheduling your Annual Wellness Visit with the clinical pharmacist so that the appointment may be made for the proper length of time.     Continue current medications. Continue good therapeutic lifestyle changes which include good diet and exercise. Fall precautions discussed with patient. If an FOBT was given today- please return it to our front desk. If you are over 20 years old - you may need Prevnar 43 or the adult Pneumonia vaccine.  **Flu shots are available--- please call and schedule a FLU-CLINIC appointment**  After your visit with [place] today you will receive a survey in the mail or online from [employer] regarding your care with [place]. Please take a moment to fill this out. Your feedback is very  important to [place] as you can help [place] better understand your patient needs as well as improve your experience and satisfaction. WE CARE ABOUT YOU!!!  Continue to follow-up with surgeon and eye specialist because of Graves' disease impact on the right eye. Continue to follow-up with endocrinologist as needed and we will make sure he gets a copy of the blood work. Add Claritin 1 daily to Flonase. Use Bactroban antibiotic ointment sparingly in each nostril at bedtime for 7-10 days. Continue with Flonase and nasal saline If the throat problem gets worse get back in touch with [place]

## 2017-07-28 NOTE — Addendum Note (Signed)
Addended by: Zannie Cove on: 07/28/2017 09:26 AM   Modules accepted: Orders

## 2017-07-28 NOTE — Progress Notes (Signed)
Subjective:    Patient ID: Mario Smith, male    DOB: [birth year], 77 y.o.   MRN: [id number]  HPI Pt here for follow up and management of chronic medical problems which includes hyperthyroid, hyperlipidemia, and hypertension. He is taking medication regularly.  The patient's complaint today is that his ears are stopped up.  He is due to have a PSA and prostate check today.  He has had lab work done and we will will review this during the visit today.  This patient has a history of thyrotoxicosis or Graves' disease hyperlipidemia elevated PSA low platelet count vitamin D deficiency and hypertension.  He is followed regularly by the endocrinologist at [facility].  We will make sure that the endocrinologist gets a copy of all the blood work.  We will review this with him during the visit today.  The PSA remains good and stable and lower than it was one year ago and is now 1.7.  The CBC has a normal white blood cell count.  Hemoglobin is good at 13.7 and the platelet count remains slightly decreased at 141,000.  The blood sugar is good at 94 and the creatinine, the most important kidney function test is within normal limits.  All of the electrolytes including potassium are good.  Cholesterol numbers have an LDL C that is good at 66 and triglycerides are good at 66.  Good cholesterol remains low.  Vitamin D level is good at 56.4.  All thyroid function tests are currently within normal limits.  All liver function tests all within normal limits.  Denies any chest pain pressure tightness or palpitations.  He denies any trouble with shortness of breath.  He has had a couple episodes of nausea and does have some constipation but denies any blood in the stool or black tarry bowel movements.  He is not sure if and when he needs another colonoscopy and we will look into this for him.  He is passing his water without problems.  Based on the record, Dr. Deatra Ina did his last colonoscopy and  according to his note there is a family history of colon cancer in a sister of the patient.  The patient did have a polyp.  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 Nicoletta Dress gets a copy of the blood work and the patient does have surgery planned for his right eye in April.    Patient Active Problem List   Diagnosis Date Noted  . Graves disease 10/27/2016  . Elevated PSA 10/27/2016  . Thrombocytopenia ([place]) 06/25/2016  . Hyperthyroidism 02/24/2016  . Hyperlipidemia 05/27/2013  . Vitamin D deficiency 05/27/2013  . Hypertension 02/11/2010   Outpatient Encounter Medications as of 07/28/2017  Medication Sig  . aspirin 81 MG EC tablet Take 81 mg by mouth daily.    . Cholecalciferol (VITAMIN D3) 2000 UNITS TABS Take 1 tablet by mouth daily.    . Coenzyme Q10 (CO Q 10 PO) Take 2 capsules by mouth daily.   . fluticasone (FLONASE) 50 MCG/ACT nasal spray USE TWO SPRAY(S) IN EACH NOSTRIL ONCE DAILY  . hydrochlorothiazide (MICROZIDE) 12.5 MG capsule TAKE 1 CAPSULE BY MOUTH ONCE DAILY  . losartan (COZAAR) 100 MG tablet TAKE 1 TABLET BY MOUTH ONCE DAILY  . methimazole (TAPAZOLE) 10 MG tablet Take 10 mg by mouth daily.  . metoprolol tartrate (LOPRESSOR) 25 MG tablet Take 1 tablet (25 mg total) by mouth 2 (two) times daily.  . Omega-3  Fatty Acids (FISH OIL) 1000 MG CAPS Take 1 capsule by mouth 2 (two) times daily.    . rosuvastatin (CRESTOR) 20 MG tablet TAKE 1 TABLET BY MOUTH ONCE DAILY  . Selenium 100 MCG TABS Take 1 tablet by mouth daily.   Marland Kitchen UNABLE TO FIND Med Name: VIT B-6 - 25 mg - one a day  . budesonide-formoterol (SYMBICORT) 80-4.5 MCG/ACT inhaler Inhale 2 puffs into the lungs 2 (two) times daily.  . [DISCONTINUED] levofloxacin (LEVAQUIN) 750 MG tablet Take 1 tablet (750 mg total) by mouth daily.   No facility-administered encounter medications on file as of 07/28/2017.      Review of Systems  Constitutional: Negative.   HENT: Positive for ear pain ("stopped up").   Eyes:  Negative.   Respiratory: Negative.   Cardiovascular: Negative.   Gastrointestinal: Negative.   Endocrine: Negative.   Genitourinary: Negative.   Musculoskeletal: Negative.   Skin: Negative.   Allergic/Immunologic: Negative.   Neurological: Negative.   Hematological: Negative.   Psychiatric/Behavioral: Negative.        Objective:   Physical Exam  Constitutional: He is oriented to person, place, and time. He appears well-developed and well-nourished. No distress.  Patient is pleasant and doing well dealing with his Graves' disease and he does have a slight tremor on the right side since he has had the Graves' disease.  HENT:  Head: Normocephalic and atraumatic.  Right Ear: External ear normal.  Left Ear: External ear normal.  Mouth/Throat: No oropharyngeal exudate.  Nasal redness and irritation left greater than right both TMs are good with no cerumen obstructing the ear canals.  There is a small cystic area in the right tonsillar fossa that appears to be benign and we will continue to monitor this.  Eyes: Conjunctivae and EOM are normal. Pupils are equal, round, and reactive to light. Right eye exhibits no discharge. Left eye exhibits no discharge. No scleral icterus.  The patient's right eye is deviated outward.  Surgery is being arranged for this as a result of his Graves' disease.  Neck: Normal range of motion. Neck supple. No thyromegaly present.  No bruits thyromegaly or anterior cervical adenopathy  Cardiovascular: Normal rate, regular rhythm, normal heart sounds and intact distal pulses.  No murmur heard. The heart is regular at 84/min  Pulmonary/Chest: Effort normal and breath sounds normal. No respiratory distress. He has no wheezes. He has no rales. He exhibits no tenderness.  No axillary adenopathy or chest wall masses  Abdominal: Soft. Bowel sounds are normal. He exhibits no mass. There is no tenderness. There is no rebound and no guarding.  No liver or spleen  enlargement no masses and no inguinal adenopathy with good inguinal pulses  Genitourinary: Rectum normal and penis normal.  Genitourinary Comments: The prostate is slightly enlarged but smooth.  There were no rectal masses.  There were no inguinal hernias palpable and the external genitalia were within normal limits.  Musculoskeletal: Normal range of motion. He exhibits no edema.  Lymphadenopathy:    He has no cervical adenopathy.  Neurological: He is alert and oriented to person, place, and time. He has normal reflexes. No cranial nerve deficit.  Tremor right hand  Skin: Skin is warm and dry. No rash noted.  Psychiatric: He has a normal mood and affect. His behavior is normal. Judgment and thought content normal.  Nursing note and vitals reviewed.  BP 124/70 (BP Location: Left Arm)   Pulse (!) 105   Temp (!) 96.8 F (36 C) (  Oral)   Ht 5' 7.5" (1.715 m)   Wt 173 lb (78.5 kg)   BMI 26.70 kg/m         Assessment & Plan:  1. Hyperthyroidism -Continue with treatment as recommended by endocrinologist  2. Vitamin D deficiency -The vitamin D level was good and he will continue with current treatment  3. Elevated PSA -The PSA is within normal limits and not elevated and the prostate was slightly enlarged but normal feeling without lumps. - Urinalysis, Complete  4. Pure hypercholesterolemia -Cholesterol numbers were good he will continue with current treatment  5. Graves disease -Continue with treatment as recommended by endocrinology  6. Hypertension -Blood pressure is good today and he will continue with current treatment  Patient Instructions                       Medicare Annual Wellness Visit  [facility] and the medical providers at [facility] strive to bring you the best medical care.  In doing so we not only want to address your current medical conditions and concerns but also to detect new conditions early and prevent illness, disease and  health-related problems.    Medicare offers a yearly Wellness Visit which allows our clinical staff to assess your need for preventative services including immunizations, lifestyle education, counseling to decrease risk of preventable diseases and screening for fall risk and other medical concerns.    This visit is provided free of charge (no copay) for all Medicare recipients. The clinical pharmacists at [facility] have begun to conduct these Wellness Visits which will also include a thorough review of all your medications.    As you primary medical provider recommend that you make an appointment for your Annual Wellness Visit if you have not done so already this year.  You may set up this appointment before you leave today or you may call back ([phone]) and schedule an appointment.  Please make sure when you call that you mention that you are scheduling your Annual Wellness Visit with the clinical pharmacist so that the appointment may be made for the proper length of time.     Continue current medications. Continue good therapeutic lifestyle changes which include good diet and exercise. Fall precautions discussed with patient. If an FOBT was given today- please return it to our front desk. If you are over 52 years old - you may need Prevnar 66 or the adult Pneumonia vaccine.  **Flu shots are available--- please call and schedule a FLU-CLINIC appointment**  After your visit with [place] today you will receive a survey in the mail or online from [employer] regarding your care with [place]. Please take a moment to fill this out. Your feedback is very important to [place] as you can help [place] better understand your patient needs as well as improve your experience and satisfaction. WE CARE ABOUT YOU!!!  Continue to follow-up with surgeon and eye specialist because of Graves' disease impact on the right eye. Continue to follow-up with endocrinologist as needed and we will make sure he gets a  copy of the blood work. Add Claritin 1 daily to Flonase. Use Bactroban antibiotic ointment sparingly in each nostril at bedtime for 7-10 days. Continue with Flonase and nasal saline If the throat problem gets worse get back in touch with [place]   Arrie Senate MD

## 2017-07-31 ENCOUNTER — Telehealth: Payer: Self-pay | Admitting: Internal Medicine

## 2017-07-31 NOTE — Telephone Encounter (Signed)
ok 

## 2017-07-31 NOTE — Telephone Encounter (Signed)
Dr. Pyrtle please see below and advise. 

## 2017-08-01 ENCOUNTER — Telehealth: Payer: Self-pay | Admitting: Family Medicine

## 2017-08-01 MED ORDER — CEFDINIR 300 MG PO CAPS: 300.0000 mg | ORAL_CAPSULE | Freq: Two times a day (BID) | ORAL | 0 refills | Status: DC

## 2017-08-01 NOTE — Telephone Encounter (Signed)
Discontinue the cream, which I think was Bactroban, and put the patient on some antibiotics like Omnicef 300 mg twice daily for 10 days.  Tell him to use some nasal saline spray in each nostril 4 times daily.

## 2017-08-01 NOTE — Telephone Encounter (Signed)
Pt notified of recommendation Verbalizes understanding RX sent into [facility] per pt request Okayed per Dr Laurance Flatten

## 2017-08-02 NOTE — Telephone Encounter (Signed)
Patient has been notified of this and states will be having eye surgery soon and will call back to schedule an appointment.

## 2017-08-09 ENCOUNTER — Other Ambulatory Visit: Payer: Self-pay | Admitting: Family Medicine

## 2017-08-09 DIAGNOSIS — J301 Allergic rhinitis due to pollen: Secondary | ICD-10-CM

## 2017-08-17 ENCOUNTER — Telehealth: Payer: Self-pay | Admitting: Family Medicine

## 2017-08-17 ENCOUNTER — Ambulatory Visit (INDEPENDENT_AMBULATORY_CARE_PROVIDER_SITE_OTHER): Payer: Medicare HMO | Admitting: Family Medicine

## 2017-08-17 ENCOUNTER — Encounter: Payer: Self-pay | Admitting: Family Medicine

## 2017-08-17 VITALS — BP 133/78 | HR 78 | Temp 96.6°F | Ht 67.5 in | Wt 174.0 lb

## 2017-08-17 DIAGNOSIS — R3129 Other microscopic hematuria: Secondary | ICD-10-CM

## 2017-08-17 DIAGNOSIS — H8110 Benign paroxysmal vertigo, unspecified ear: Secondary | ICD-10-CM | POA: Diagnosis not present

## 2017-08-17 DIAGNOSIS — M75102 Unspecified rotator cuff tear or rupture of left shoulder, not specified as traumatic: Secondary | ICD-10-CM | POA: Diagnosis not present

## 2017-08-17 MED ORDER — MECLIZINE HCL 25 MG PO TABS: 25.0000 mg | ORAL_TABLET | Freq: Three times a day (TID) | ORAL | 0 refills | Status: DC | PRN

## 2017-08-17 NOTE — Patient Instructions (Signed)
Great to see you!  Try Meclizine, Try the epleys maneuvers.

## 2017-08-17 NOTE — Progress Notes (Signed)
diz

## 2017-08-17 NOTE — Telephone Encounter (Signed)
Returned call

## 2017-08-17 NOTE — Progress Notes (Signed)
   HPI  Patient presents today here with dizziness  States he has had 2 weeks of off-and-on dizziness that worsens with rapid head movement.  At times it so intense that he states he has to hold the wall to walk and has vomiting. He denies any unilateral weakness numbness or tingling. No problems eating drinking or talking. He is thinking clearly.  He also has left shoulder pain and requests an injection in the shoulder today.  Patient also has history of UTI about 6 weeks ago and would like follow-up urinalysis.  PMH: Smoking status noted ROS: Per HPI  Objective: BP 133/78   Pulse 78   Temp (!) 96.6 F (35.9 C) (Oral)   Ht 5' 7.5" (1.715 m)   Wt 174 lb (78.9 kg)   BMI 26.85 kg/m  Gen: NAD, alert, cooperative with exam HEENT: NCAT, PERRLa, R eye with discoordinate gaze- stable up and to the L CV: RRR, good S1/S2, no murmur Resp: CTABL, no wheezes, non-labored Ext: No edema, warm Neuro: Alert and oriented, CN 2-12 intact except for R eye which is not new, Strength 5/5 and sensation intact BL MSK:  + Neer and Hawkins sign on the left, negative empty can  Assessment and plan:   #BPPV Patient with dizziness likely BPPV Discussed meclizine, discussed Epley's maneuvers and given handout.  #Rotator cuff syndrome left shoulder May return for injection if preferred, exam is consistent with rotator cuff syndrome, good preserved strength, unlikely to be complete tear  #microscopic hematuria follow-up urinalysis   Mario Apple, MD [facility] 08/17/2017, 9:54 AM

## 2017-08-19 ENCOUNTER — Telehealth: Payer: Self-pay | Admitting: Family Medicine

## 2017-08-19 NOTE — Telephone Encounter (Signed)
Please address

## 2017-08-20 NOTE — Telephone Encounter (Signed)
Discontinue medication that was recalled and change to Livalo 2 mg 1 daily.  Call patient.  If Livalo is not covered by insurance, please let me know

## 2017-08-21 MED ORDER — TELMISARTAN 80 MG PO TABS: 80.0000 mg | ORAL_TABLET | Freq: Every day | ORAL | 1 refills | Status: DC

## 2017-08-21 NOTE — Telephone Encounter (Signed)
PT wife has called back wanting to speak to Roselyn Reef to see what Dr Laurance Flatten wants to do about the medication

## 2017-08-21 NOTE — Telephone Encounter (Signed)
First note was error ---- losartan is recalled... Need a New BP med   Been out for 3 days

## 2017-08-21 NOTE — Telephone Encounter (Signed)
Pt aware.

## 2017-08-21 NOTE — Telephone Encounter (Signed)
Cozaar changed to micardis

## 2017-08-23 DIAGNOSIS — E0500 Thyrotoxicosis with diffuse goiter without thyrotoxic crisis or storm: Secondary | ICD-10-CM | POA: Diagnosis not present

## 2017-08-23 DIAGNOSIS — H5021 Vertical strabismus, right eye: Secondary | ICD-10-CM | POA: Diagnosis not present

## 2017-08-23 DIAGNOSIS — H05242 Constant exophthalmos, left eye: Secondary | ICD-10-CM | POA: Diagnosis not present

## 2017-08-23 DIAGNOSIS — H5010 Unspecified exotropia: Secondary | ICD-10-CM | POA: Diagnosis not present

## 2017-08-28 ENCOUNTER — Encounter: Payer: Self-pay | Admitting: Internal Medicine

## 2017-09-08 ENCOUNTER — Encounter: Payer: Self-pay | Admitting: Family Medicine

## 2017-09-08 ENCOUNTER — Ambulatory Visit (INDEPENDENT_AMBULATORY_CARE_PROVIDER_SITE_OTHER): Payer: Medicare HMO | Admitting: Family Medicine

## 2017-09-08 VITALS — BP 120/60 | HR 59 | Temp 96.9°F | Ht 67.5 in | Wt 172.6 lb

## 2017-09-08 DIAGNOSIS — K219 Gastro-esophageal reflux disease without esophagitis: Secondary | ICD-10-CM

## 2017-09-08 DIAGNOSIS — H8110 Benign paroxysmal vertigo, unspecified ear: Secondary | ICD-10-CM | POA: Diagnosis not present

## 2017-09-08 MED ORDER — OMEPRAZOLE 40 MG PO CPDR: 40.0000 mg | DELAYED_RELEASE_CAPSULE | Freq: Every day | ORAL | 3 refills | Status: DC

## 2017-09-08 MED ORDER — MECLIZINE HCL 25 MG PO TABS: 25.0000 mg | ORAL_TABLET | Freq: Three times a day (TID) | ORAL | 0 refills | Status: DC | PRN

## 2017-09-08 NOTE — Progress Notes (Signed)
   HPI  Patient presents today for continued vertigo as well as new onset heartburn.  Patient describes burning type discomfort in the abdomen that lasts a few hours, it started few months ago. It bothers him most days.  Patient also continues to have room spinning type sensation, he states that has become more frequent.  Meclizine seems to help, he requests refill. An episode when he first got out of bed one day ago causing nausea and vomiting multiple episodes. Patient has waxing waning symptoms. He has tried Epley's maneuvers multiple times without improvement.  PMH: Smoking status noted ROS: Per HPI  Objective: BP 120/60   Pulse (!) 59   Temp (!) 96.9 F (36.1 C) (Oral)   Ht 5' 7.5" (1.715 m)   Wt 172 lb 9.6 oz (78.3 kg)   BMI 26.63 kg/m  Gen: NAD, alert, cooperative with exam HEENT: NCAT [place], R eye with doiscoordinate gaze which is stable from previous, L eye EOMI, TMs WNL CV: RRR, good S1/S2, no murmur Resp: CTABL, no wheezes, non-labored Ext: No edema, warm Neuro: Alert and oriented, CN 2-12 intact except for R eye which is stable, Strength 5/5 and sensation grossly intact  Assessment and plan:  # BPPV Continued Improves slightly with Meclizine, refill ENT NO Signs of Central lesion  # Gerd New onset, start PPI   Orders Placed This Encounter  Procedures  . Ambulatory referral to ENT    Referral Priority:   Routine    Referral Type:   Consultation    Referral Reason:   Specialty Services Required    Requested Specialty:   Otolaryngology    Number of Visits Requested:   1    Meds ordered this encounter  Medications  . meclizine (ANTIVERT) 25 MG tablet    Sig: Take 1 tablet (25 mg total) by mouth 3 (three) times daily as needed for dizziness.    Dispense:  30 tablet    Refill:  0  . omeprazole (PRILOSEC) 40 MG capsule    Sig: Take 1 capsule (40 mg total) by mouth daily.    Dispense:  30 capsule    Refill:  [place], MD [facility] 09/08/2017, 9:24 AM

## 2017-09-08 NOTE — Patient Instructions (Signed)
Great to see you!   

## 2017-09-25 DIAGNOSIS — I10 Essential (primary) hypertension: Secondary | ICD-10-CM | POA: Diagnosis not present

## 2017-09-25 DIAGNOSIS — E0500 Thyrotoxicosis with diffuse goiter without thyrotoxic crisis or storm: Secondary | ICD-10-CM | POA: Diagnosis not present

## 2017-09-25 DIAGNOSIS — K219 Gastro-esophageal reflux disease without esophagitis: Secondary | ICD-10-CM | POA: Diagnosis not present

## 2017-09-25 DIAGNOSIS — H5022 Vertical strabismus, left eye: Secondary | ICD-10-CM | POA: Diagnosis not present

## 2017-09-25 DIAGNOSIS — M4802 Spinal stenosis, cervical region: Secondary | ICD-10-CM | POA: Diagnosis not present

## 2017-09-25 DIAGNOSIS — Z981 Arthrodesis status: Secondary | ICD-10-CM | POA: Diagnosis not present

## 2017-09-25 DIAGNOSIS — E785 Hyperlipidemia, unspecified: Secondary | ICD-10-CM | POA: Diagnosis not present

## 2017-09-25 DIAGNOSIS — R42 Dizziness and giddiness: Secondary | ICD-10-CM | POA: Diagnosis not present

## 2017-09-25 DIAGNOSIS — H532 Diplopia: Secondary | ICD-10-CM | POA: Diagnosis not present

## 2017-09-25 DIAGNOSIS — D696 Thrombocytopenia, unspecified: Secondary | ICD-10-CM | POA: Diagnosis not present

## 2017-09-25 DIAGNOSIS — H5021 Vertical strabismus, right eye: Secondary | ICD-10-CM | POA: Diagnosis not present

## 2017-09-25 DIAGNOSIS — H5212 Myopia, left eye: Secondary | ICD-10-CM | POA: Diagnosis not present

## 2017-09-28 ENCOUNTER — Other Ambulatory Visit: Payer: Self-pay | Admitting: Family Medicine

## 2017-09-28 ENCOUNTER — Telehealth: Payer: Self-pay | Admitting: Family Medicine

## 2017-09-28 MED ORDER — ONDANSETRON HCL 4 MG PO TABS: 4.0000 mg | ORAL_TABLET | Freq: Three times a day (TID) | ORAL | 0 refills | Status: DC | PRN

## 2017-09-28 NOTE — Telephone Encounter (Signed)
What symptoms do you have? Sick on stomach and has diarrhea. Don't know if it's coming from vertigo  How long have you been sick? Diarrhea started today  Have you been seen for this problem? NO   If your provider decides to give you a prescription, which pharmacy would you like for it to be sent to? [facility] in [place]   Patient informed that this information will be sent to the clinical staff for review and that they should receive a follow up call.

## 2017-09-28 NOTE — Telephone Encounter (Signed)
Spoke wh patients wife- having nausea- zofran rx sent to pharmacy

## 2017-09-28 NOTE — Telephone Encounter (Signed)
Vertigo would not cause diarrhea- try imodium AD OTC for diarrhea

## 2017-09-28 NOTE — Telephone Encounter (Signed)
Patient seen Dr. [name] 4/5 for Vertigo. Covering PCP- please review and advise

## 2017-09-28 NOTE — Telephone Encounter (Signed)
Patient spoke with Ronnald Collum, FNP.

## 2017-09-29 ENCOUNTER — Telehealth: Payer: Self-pay | Admitting: Family Medicine

## 2017-09-29 NOTE — Telephone Encounter (Signed)
Spoke with pt's wife regarding fluids Wife states pt has only had 1 episode of diarrhea today Pt is staying well hydrated

## 2017-09-30 ENCOUNTER — Telehealth: Payer: Self-pay | Admitting: Family Medicine

## 2017-09-30 NOTE — Telephone Encounter (Signed)
What symptoms do you have? Nauseas  How long have you been sick? Several days  Have you been seen for this problem? NO If your provider decides to give you a prescription, which pharmacy would you like for it to be sent to? [facility] in [place]   Patient informed that this information will be sent to the clinical staff for review and that they should receive a follow up call.

## 2017-09-30 NOTE — Telephone Encounter (Signed)
Patients wife aware to try and give him the zofran 30 minutes before she trys to give him anything to eat and drink. Wife aware that we have a provider on call 24 hours and to call us if they need anything.

## 2017-10-02 ENCOUNTER — Telehealth: Payer: Self-pay | Admitting: Family Medicine

## 2017-10-02 ENCOUNTER — Other Ambulatory Visit: Payer: Self-pay | Admitting: Physician Assistant

## 2017-10-02 MED ORDER — PROMETHAZINE HCL 25 MG PO TABS
25.0000 mg | ORAL_TABLET | Freq: Three times a day (TID) | ORAL | 0 refills | Status: DC | PRN
Start: 2017-10-02 — End: 2017-12-04

## 2017-10-02 NOTE — Telephone Encounter (Signed)
Dr [name] - you seen him last for this and you are [employer] coverage - any thoughts?

## 2017-10-02 NOTE — Telephone Encounter (Signed)
Zofran is not helping his nausea, it is making him worse. He is taking his meclizine. He would like phenergan for his nausea instead. Took his last one at 3 pm.

## 2017-10-02 NOTE — Telephone Encounter (Signed)
Wife aware and verbalizes understanding.  

## 2017-10-02 NOTE — Telephone Encounter (Signed)
Script sent  

## 2017-10-02 NOTE — Telephone Encounter (Signed)
Pt with nausea from meclizine.   I would recommend seeing ENT, Try 1/2 tab with zofran which was sent last week.   Laroy Apple, MD [facility] 10/02/2017, 2:08 PM

## 2017-10-03 DIAGNOSIS — Z888 Allergy status to other drugs, medicaments and biological substances status: Secondary | ICD-10-CM | POA: Diagnosis not present

## 2017-10-03 DIAGNOSIS — Z48810 Encounter for surgical aftercare following surgery on the sense organs: Secondary | ICD-10-CM | POA: Diagnosis not present

## 2017-10-03 DIAGNOSIS — Z881 Allergy status to other antibiotic agents status: Secondary | ICD-10-CM | POA: Diagnosis not present

## 2017-10-03 DIAGNOSIS — H5022 Vertical strabismus, left eye: Secondary | ICD-10-CM | POA: Diagnosis not present

## 2017-10-03 DIAGNOSIS — E0500 Thyrotoxicosis with diffuse goiter without thyrotoxic crisis or storm: Secondary | ICD-10-CM | POA: Diagnosis not present

## 2017-10-03 DIAGNOSIS — Z882 Allergy status to sulfonamides status: Secondary | ICD-10-CM | POA: Diagnosis not present

## 2017-10-03 DIAGNOSIS — H02535 Eyelid retraction left lower eyelid: Secondary | ICD-10-CM | POA: Diagnosis not present

## 2017-10-03 DIAGNOSIS — Z885 Allergy status to narcotic agent status: Secondary | ICD-10-CM | POA: Diagnosis not present

## 2017-10-03 NOTE — Telephone Encounter (Signed)
Patients wife aware. And she wants Mario Smith to see him tomorrow. Advised Pt wife Mario Smith was full and I could get him in with someone else they refused only wants to see [name] or Mario Smith on 10/04/17. Please advise.

## 2017-10-04 ENCOUNTER — Encounter: Payer: Self-pay | Admitting: Family Medicine

## 2017-10-04 ENCOUNTER — Ambulatory Visit (INDEPENDENT_AMBULATORY_CARE_PROVIDER_SITE_OTHER): Payer: Medicare HMO | Admitting: Family Medicine

## 2017-10-04 VITALS — BP 105/68 | HR 70 | Temp 97.1°F | Ht 67.5 in | Wt 166.0 lb

## 2017-10-04 DIAGNOSIS — K828 Other specified diseases of gallbladder: Secondary | ICD-10-CM

## 2017-10-04 DIAGNOSIS — R110 Nausea: Secondary | ICD-10-CM | POA: Diagnosis not present

## 2017-10-04 DIAGNOSIS — R42 Dizziness and giddiness: Secondary | ICD-10-CM | POA: Diagnosis not present

## 2017-10-04 DIAGNOSIS — E0590 Thyrotoxicosis, unspecified without thyrotoxic crisis or storm: Secondary | ICD-10-CM

## 2017-10-04 DIAGNOSIS — K219 Gastro-esophageal reflux disease without esophagitis: Secondary | ICD-10-CM | POA: Diagnosis not present

## 2017-10-04 DIAGNOSIS — Z7689 Persons encountering health services in other specified circumstances: Secondary | ICD-10-CM | POA: Diagnosis not present

## 2017-10-04 DIAGNOSIS — R6889 Other general symptoms and signs: Secondary | ICD-10-CM | POA: Diagnosis not present

## 2017-10-04 HISTORY — DX: Other specified diseases of gallbladder: K82.8

## 2017-10-04 MED ORDER — DEXTROSE 5 % IV BOLUS
500.0000 mL | Freq: Once | INTRAVENOUS | Status: AC
  Administered 2017-10-04: 500 mL via INTRAVENOUS

## 2017-10-04 NOTE — Progress Notes (Signed)
Subjective:    Patient ID: Mario Smith, male    DOB: 1940/07/13, 77 y.o.   MRN: [id number]  HPI Patient here today for vertigo and nausea.  The patient complains of being sick on the stomach.  He had Phenergan called in yesterday but has had no vomiting or diarrhea only nausea.  He has had vertigo off and on for 2 months.  He is on meclizine.  He had eye surgery 1 week ago and has decreased appetite.  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  Today his blood pressure is 105/68.  He comes to the visit today with his wife.  He is being treated and followed for his hyperthyroidism from physicians at [facility].  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  The patient comes to the visit today with his wife.  He has not been taking the myocarditis regularly because his blood pressure has been running low.  The blood pressure today is 105/68.  He has not been taking a lot of his medicines because he has been so nauseated.  He is also not been taking his omeprazole.  Recently he has not had any of the omega-3 fatty acids.  He did have the endocrinologist review his recent blood work and no changes were made as far as treating his hyperthyroid condition.  He has had the nausea off and on and almost persistently for the past couple of months.  He does have an occasional episode of vertigo but the nausea is persistent despite the vertigo.  The most recent eye surgery as mentioned was 1 week ago.  We will give him some IV fluids today and see if this can help him feel better and wait until the blood work is returned along with an FOBT.  We will have the wife to call us back on Monday regarding the regular use of omeprazole to see if this is helped with his persistent nausea symptoms and they will continue to use Phenergan if needed for additional treatment.    Patient Active  Problem List   Diagnosis Date Noted  . Graves disease 10/27/2016  . Elevated PSA 10/27/2016  . Thrombocytopenia ([place]) 06/25/2016  . Hyperthyroidism 02/24/2016  . Hyperlipidemia 05/27/2013  . Vitamin D deficiency 05/27/2013  . Hypertension 02/11/2010   Outpatient Encounter Medications as of 10/04/2017  Medication Sig  . aspirin 81 MG EC tablet Take 81 mg by mouth daily.    . Cholecalciferol (VITAMIN D3) 2000 UNITS TABS Take 1 tablet by mouth daily.    . Coenzyme Q10 (CO Q 10 PO) Take 2 capsules by mouth daily.   . fluticasone (FLONASE) 50 MCG/ACT nasal spray USE 2 SPRAYS IN EACH NOSTRIL ONCE DAILY  . hydrochlorothiazide (MICROZIDE) 12.5 MG capsule TAKE 1 CAPSULE BY MOUTH ONCE DAILY  . methimazole (TAPAZOLE) 10 MG tablet Take 10 mg by mouth daily.  . metoprolol tartrate (LOPRESSOR) 25 MG tablet Take 1 tablet (25 mg total) by mouth 2 (two) times daily.  . mupirocin ointment (BACTROBAN) 2 % Place 1 application into the nose 2 (two) times daily.  . Omega-3 Fatty Acids (FISH OIL) 1000 MG CAPS Take 1 capsule by mouth 2 (two) times daily.    Marland Kitchen omeprazole (PRILOSEC) 40 MG capsule Take 1 capsule (40 mg total) by mouth daily.  . promethazine (PHENERGAN) 25 MG tablet Take 1 tablet (25 mg total)  by mouth every 8 (eight) hours as needed for nausea or vomiting.  . Selenium 100 MCG TABS Take 1 tablet by mouth daily.   Marland Kitchen telmisartan (MICARDIS) 80 MG tablet Take 1 tablet (80 mg total) by mouth daily.  Marland Kitchen UNABLE TO FIND Med Name: VIT B-6 - 25 mg - one a day  . budesonide-formoterol (SYMBICORT) 80-4.5 MCG/ACT inhaler Inhale 2 puffs into the lungs 2 (two) times daily.  . meclizine (ANTIVERT) 25 MG tablet TAKE 1 TABLET (25 MG TOTAL) BY MOUTH 3 (THREE) TIMES DAILY AS NEEDED FOR DIZZINESS. (Patient not taking: Reported on 10/04/2017)  . [DISCONTINUED] ondansetron (ZOFRAN) 4 MG tablet Take 1 tablet (4 mg total) by mouth every 8 (eight) hours as needed for nausea or vomiting.   No facility-administered encounter  medications on file as of 10/04/2017.       Review of Systems  Constitutional: Negative.   HENT: Negative.   Eyes: Negative.   Respiratory: Negative.   Cardiovascular: Negative.   Gastrointestinal: Positive for nausea. Negative for abdominal pain, diarrhea and vomiting.  Endocrine: Negative.   Genitourinary: Negative.   Musculoskeletal: Negative.   Skin: Negative.   Allergic/Immunologic: Negative.   Neurological: Positive for light-headedness (vertigo at times ).  Hematological: Negative.   Psychiatric/Behavioral: Negative.        Objective:   Physical Exam  Constitutional: He is oriented to person, place, and time. He appears well-developed and well-nourished. He appears distressed.  HENT:  Head: Normocephalic and atraumatic.  Right Ear: External ear normal.  Left Ear: External ear normal.  Mouth/Throat: Oropharynx is clear and moist. No oropharyngeal exudate.  Nasal turbinate congestion bilaterally  Eyes: Pupils are equal, round, and reactive to light. Conjunctivae and EOM are normal. Right eye exhibits no discharge. Left eye exhibits no discharge. No scleral icterus.  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  Recently had surgery on both eyes.  Neck: Normal range of motion. Neck supple. No thyromegaly present.  Cardiovascular: Normal rate, regular rhythm and normal heart sounds. Exam reveals no friction rub.  No murmur heard. Heart is regular at 72/min  Pulmonary/Chest: Effort normal and breath sounds normal. No respiratory distress. He has no wheezes. He has no rales.  Clear anteriorly and posteriorly without wheezes or rhonchi.  Abdominal: Soft. Bowel sounds are normal. He exhibits no mass. There is no tenderness. There is no rebound and no guarding.  No abdominal tenderness masses or organ enlargement palpable.  Musculoskeletal: Normal range of motion. He exhibits no edema or tenderness.  Lymphadenopathy:    He has no cervical adenopathy.    Neurological: He is alert and oriented to person, place, and time. He has normal reflexes. No cranial nerve deficit.  Somewhat tremulous with both upper extremities.    Skin: Skin is warm and dry. No rash noted. No erythema. There is pallor.  Psychiatric: His behavior is normal. Judgment and thought content normal.  Somewhat flat in mood.  Nursing note and vitals reviewed.  BP 105/68 (BP Location: Left Arm)   Pulse 70   Temp (!) 97.1 F (36.2 C) (Oral)   Ht 5' 7.5" (1.715 m)   Wt 166 lb (75.3 kg)   BMI 25.62 kg/m         Assessment & Plan:  1. Vertigo -Hold the meclizine for now as he is not having that much dizziness - BMP8+EGFR - CBC with Differential/Platelet  2. Nausea -Continue with the Phenergan as needed -We will discussed the case with the gastroenterologist and  consider scheduling a visit with him sooner rather than later to try to figure out what is causing this patient's persistent nausea. -May need to get abdominal ultrasound and/or CT scan prior to that visit. - BMP8+EGFR - CBC with Differential/Platelet -We will give the patient 500 cc of D5 and W today.  3. Hyperthyroidism -Continue with medicines as prescribed by the endocrinologist  4. Gastroesophageal reflux disease, esophagitis presence not specified -Add back the omeprazole taking one today when the patient gets home and continue with 1 daily before breakfast every morning  No orders of the defined types were placed in this encounter.  Patient Instructions  The patient should for the time being hold his micardis We will get lab work back and discuss the results with the patient and wife when that is returned Continue to hold the omega-3 fatty acids He should start back on the omeprazole 40 mg and begin this again today and take in the next 1 in the morning before breakfast He should continue to try to drink plenty of fluids and stay away from caffeine Continue with the Phenergan as needed for  nausea We will discussed the case with the gastroenterologist that he is going to see this summer for a colonoscopy and we may need to either get an abdominal ultrasound or CT scan before that visit.  Arrie Senate MD

## 2017-10-04 NOTE — Patient Instructions (Signed)
The patient should for the time being hold his micardis We will get lab work back and discuss the results with the patient and wife when that is returned Continue to hold the omega-3 fatty acids He should start back on the omeprazole 40 mg and begin this again today and take in the next 1 in the morning before breakfast He should continue to try to drink plenty of fluids and stay away from caffeine Continue with the Phenergan as needed for nausea We will discussed the case with the gastroenterologist that he is going to see this summer for a colonoscopy and we may need to either get an abdominal ultrasound or CT scan before that visit.

## 2017-10-04 NOTE — Addendum Note (Signed)
Addended by: Zannie Cove on: 10/04/2017 02:32 PM   Modules accepted: Orders

## 2017-10-05 ENCOUNTER — Telehealth: Payer: Self-pay | Admitting: Family Medicine

## 2017-10-05 ENCOUNTER — Telehealth: Payer: Self-pay | Admitting: *Deleted

## 2017-10-05 ENCOUNTER — Other Ambulatory Visit: Payer: Self-pay | Admitting: *Deleted

## 2017-10-05 DIAGNOSIS — R110 Nausea: Secondary | ICD-10-CM

## 2017-10-05 LAB — CBC WITH DIFFERENTIAL/PLATELET
BASOS: 0 %
Basophils Absolute: 0 10*3/uL (ref 0.0–0.2)
EOS (ABSOLUTE): 0.1 10*3/uL (ref 0.0–0.4)
EOS: 2 %
HEMATOCRIT: 31.6 % — AB (ref 37.5–51.0)
Hemoglobin: 11.3 g/dL — ABNORMAL LOW (ref 13.0–17.7)
IMMATURE GRANS (ABS): 0 10*3/uL (ref 0.0–0.1)
IMMATURE GRANULOCYTES: 0 %
LYMPHS: 40 %
Lymphocytes Absolute: 1.4 10*3/uL (ref 0.7–3.1)
MCH: 38.2 pg — ABNORMAL HIGH (ref 26.6–33.0)
MCHC: 35.8 g/dL — ABNORMAL HIGH (ref 31.5–35.7)
MCV: 107 fL — AB (ref 79–97)
MONOS ABS: 0.3 10*3/uL (ref 0.1–0.9)
Monocytes: 10 %
NEUTROS PCT: 48 %
Neutrophils Absolute: 1.6 10*3/uL (ref 1.4–7.0)
PLATELETS: 126 10*3/uL — AB (ref 150–379)
RBC: 2.96 x10E6/uL — ABNORMAL LOW (ref 4.14–5.80)
RDW: 20.4 % — AB (ref 12.3–15.4)
WBC: 3.4 10*3/uL (ref 3.4–10.8)

## 2017-10-05 LAB — BMP8+EGFR
BUN/Creatinine Ratio: 12 (ref 10–24)
BUN: 16 mg/dL (ref 8–27)
CALCIUM: 9.1 mg/dL (ref 8.6–10.2)
CO2: 23 mmol/L (ref 20–29)
CREATININE: 1.33 mg/dL — AB (ref 0.76–1.27)
Chloride: 102 mmol/L (ref 96–106)
GFR calc Af Amer: 59 mL/min/{1.73_m2} — ABNORMAL LOW (ref >59)
GFR, EST NON AFRICAN AMERICAN: 51 mL/min/{1.73_m2} — AB (ref >59)
GLUCOSE: 94 mg/dL (ref 65–99)
Potassium: 4.3 mmol/L (ref 3.5–5.2)
SODIUM: 141 mmol/L (ref 134–144)

## 2017-10-05 NOTE — Telephone Encounter (Signed)
Patient is scheduled to see Nicoletta Ba, PA-C on 10/16/17 at 230 pm (first day there were any availabilities for an APP and Dr Hilarie Fredrickson to be in clinic on same day).Patient and his wife have been advised of this time/date/location and verbalize understanding.

## 2017-10-05 NOTE — Telephone Encounter (Signed)
Spoke with pt wife about GI, [place] and labs

## 2017-10-05 NOTE — Telephone Encounter (Signed)
-----   Message from Jerene Bears, MD sent at 10/05/2017  2:10 PM EDT ----- Please get this man in to see an APP on a day soon  Preferably when I am in the office also [place]  ----- Message ----- From: Zannie Cove, LPN Sent: 08/12/1838   1:55 PM To: Jerene Bears, MD  Hi,  Dr Laurance Flatten saw this pt yesterday afternoon and wonders if you can see him sooner than planned date of 11/22/17. He is having persistent nausea and occasional vomiting. He is feeling very awful.  Please see Dr Tawanna Sat note - we are ordering [place] abd and pel, labs and FOBT.   Thanks for your help! Jamie B. LPN

## 2017-10-06 ENCOUNTER — Encounter: Payer: Self-pay | Admitting: Family Medicine

## 2017-10-06 ENCOUNTER — Other Ambulatory Visit: Payer: Medicare HMO

## 2017-10-06 ENCOUNTER — Ambulatory Visit (INDEPENDENT_AMBULATORY_CARE_PROVIDER_SITE_OTHER): Payer: Medicare HMO | Admitting: *Deleted

## 2017-10-06 DIAGNOSIS — E538 Deficiency of other specified B group vitamins: Secondary | ICD-10-CM | POA: Diagnosis not present

## 2017-10-06 DIAGNOSIS — R110 Nausea: Secondary | ICD-10-CM

## 2017-10-06 LAB — THYROID PANEL WITH TSH
FREE THYROXINE INDEX: 2.7 (ref 1.2–4.9)
T3 Uptake Ratio: 29 % (ref 24–39)
T4 TOTAL: 9.2 ug/dL (ref 4.5–12.0)
TSH: 3.66 u[IU]/mL (ref 0.450–4.500)

## 2017-10-06 LAB — SPECIMEN STATUS REPORT

## 2017-10-06 LAB — VITAMIN B12

## 2017-10-06 MED ORDER — CYANOCOBALAMIN 1000 MCG/ML IJ SOLN
1000.0000 ug | INTRAMUSCULAR | Status: AC
  Administered 2017-10-06 – 2017-10-27 (×4): 1000 ug via INTRAMUSCULAR

## 2017-10-06 NOTE — Progress Notes (Signed)
Pt given Cyanocobalamin inj Tolerated well 

## 2017-10-09 ENCOUNTER — Observation Stay (HOSPITAL_COMMUNITY)
Admission: EM | Admit: 2017-10-09 | Discharge: 2017-10-12 | Disposition: A | Discharge status: Home / Self Care | Payer: Medicare HMO | Attending: Internal Medicine | Admitting: Internal Medicine

## 2017-10-09 ENCOUNTER — Ambulatory Visit (HOSPITAL_COMMUNITY)
Admission: RE | Admit: 2017-10-09 | Discharge: 2017-10-09 | Disposition: A | Discharge status: Home / Self Care | Payer: Medicare HMO | Source: Ambulatory Visit | Attending: Family Medicine | Admitting: Family Medicine

## 2017-10-09 ENCOUNTER — Encounter (HOSPITAL_COMMUNITY): Payer: Self-pay | Admitting: *Deleted

## 2017-10-09 ENCOUNTER — Other Ambulatory Visit: Payer: Self-pay | Admitting: Family Medicine

## 2017-10-09 DIAGNOSIS — Z7982 Long term (current) use of aspirin: Secondary | ICD-10-CM | POA: Diagnosis not present

## 2017-10-09 DIAGNOSIS — E0500 Thyrotoxicosis with diffuse goiter without thyrotoxic crisis or storm: Secondary | ICD-10-CM | POA: Insufficient documentation

## 2017-10-09 DIAGNOSIS — E785 Hyperlipidemia, unspecified: Secondary | ICD-10-CM | POA: Diagnosis present

## 2017-10-09 DIAGNOSIS — Z87891 Personal history of nicotine dependence: Secondary | ICD-10-CM | POA: Diagnosis not present

## 2017-10-09 DIAGNOSIS — R197 Diarrhea, unspecified: Secondary | ICD-10-CM

## 2017-10-09 DIAGNOSIS — N329 Bladder disorder, unspecified: Secondary | ICD-10-CM | POA: Diagnosis not present

## 2017-10-09 DIAGNOSIS — R112 Nausea with vomiting, unspecified: Secondary | ICD-10-CM

## 2017-10-09 DIAGNOSIS — E0590 Thyrotoxicosis, unspecified without thyrotoxic crisis or storm: Secondary | ICD-10-CM | POA: Diagnosis present

## 2017-10-09 DIAGNOSIS — Z79899 Other long term (current) drug therapy: Secondary | ICD-10-CM | POA: Insufficient documentation

## 2017-10-09 DIAGNOSIS — R1013 Epigastric pain: Secondary | ICD-10-CM

## 2017-10-09 DIAGNOSIS — I10 Essential (primary) hypertension: Secondary | ICD-10-CM | POA: Diagnosis not present

## 2017-10-09 DIAGNOSIS — E860 Dehydration: Secondary | ICD-10-CM | POA: Diagnosis present

## 2017-10-09 DIAGNOSIS — B9681 Helicobacter pylori [H. pylori] as the cause of diseases classified elsewhere: Secondary | ICD-10-CM | POA: Insufficient documentation

## 2017-10-09 DIAGNOSIS — R110 Nausea: Secondary | ICD-10-CM

## 2017-10-09 DIAGNOSIS — R109 Unspecified abdominal pain: Secondary | ICD-10-CM | POA: Diagnosis not present

## 2017-10-09 DIAGNOSIS — K2950 Unspecified chronic gastritis without bleeding: Secondary | ICD-10-CM | POA: Diagnosis not present

## 2017-10-09 DIAGNOSIS — N179 Acute kidney failure, unspecified: Secondary | ICD-10-CM | POA: Insufficient documentation

## 2017-10-09 DIAGNOSIS — R5383 Other fatigue: Secondary | ICD-10-CM | POA: Diagnosis not present

## 2017-10-09 DIAGNOSIS — K2970 Gastritis, unspecified, without bleeding: Secondary | ICD-10-CM

## 2017-10-09 DIAGNOSIS — R42 Dizziness and giddiness: Secondary | ICD-10-CM

## 2017-10-09 DIAGNOSIS — D649 Anemia, unspecified: Secondary | ICD-10-CM | POA: Diagnosis present

## 2017-10-09 HISTORY — DX: Dizziness and giddiness: R42

## 2017-10-09 HISTORY — DX: Dehydration: E86.0

## 2017-10-09 HISTORY — DX: Other specified diseases of gallbladder: K82.8

## 2017-10-09 HISTORY — DX: Unspecified abdominal pain: R10.9

## 2017-10-09 HISTORY — DX: Essential (primary) hypertension: I10

## 2017-10-09 LAB — URINALYSIS, ROUTINE W REFLEX MICROSCOPIC
Bacteria, UA: NONE SEEN
GLUCOSE, UA: NEGATIVE mg/dL
Hgb urine dipstick: NEGATIVE
Ketones, ur: 5 mg/dL — AB
LEUKOCYTES UA: NEGATIVE
NITRITE: NEGATIVE
PH: 5 (ref 5.0–8.0)
Protein, ur: 30 mg/dL — AB
SPECIFIC GRAVITY, URINE: 1.029 (ref 1.005–1.030)

## 2017-10-09 LAB — COMPREHENSIVE METABOLIC PANEL
ALK PHOS: 50 U/L (ref 38–126)
ALT: 11 U/L — AB (ref 17–63)
ANION GAP: 7 (ref 5–15)
AST: 16 U/L (ref 15–41)
Albumin: 3.8 g/dL (ref 3.5–5.0)
BILIRUBIN TOTAL: 0.5 mg/dL (ref 0.3–1.2)
BUN: 16 mg/dL (ref 6–20)
CALCIUM: 9.3 mg/dL (ref 8.9–10.3)
CO2: 27 mmol/L (ref 22–32)
CREATININE: 1.41 mg/dL — AB (ref 0.61–1.24)
Chloride: 109 mmol/L (ref 101–111)
GFR, EST AFRICAN AMERICAN: 54 mL/min — AB (ref >60)
GFR, EST NON AFRICAN AMERICAN: 47 mL/min — AB (ref >60)
Glucose, Bld: 94 mg/dL (ref 65–99)
Potassium: 3.6 mmol/L (ref 3.5–5.1)
Sodium: 143 mmol/L (ref 135–145)
TOTAL PROTEIN: 6.5 g/dL (ref 6.5–8.1)

## 2017-10-09 LAB — CBC
HCT: 31.2 % — ABNORMAL LOW (ref 39.0–52.0)
HEMOGLOBIN: 10.9 g/dL — AB (ref 13.0–17.0)
MCH: 38.5 pg — ABNORMAL HIGH (ref 26.0–34.0)
MCHC: 34.9 g/dL (ref 30.0–36.0)
MCV: 110.2 fL — ABNORMAL HIGH (ref 78.0–100.0)
PLATELETS: 155 10*3/uL (ref 150–400)
RBC: 2.83 MIL/uL — ABNORMAL LOW (ref 4.22–5.81)
RDW: 17.7 % — ABNORMAL HIGH (ref 11.5–15.5)
WBC: 4.1 10*3/uL (ref 4.0–10.5)

## 2017-10-09 LAB — LIPASE, BLOOD: Lipase: 65 U/L — ABNORMAL HIGH (ref 11–51)

## 2017-10-09 MED ORDER — SODIUM CHLORIDE 0.9 % IV BOLUS
500.0000 mL | Freq: Once | INTRAVENOUS | Status: AC
  Administered 2017-10-09: 500 mL via INTRAVENOUS

## 2017-10-09 MED ORDER — ONDANSETRON 4 MG PO TBDP: 4.0000 mg | ORAL_TABLET | Freq: Once | ORAL | Status: DC | PRN

## 2017-10-09 NOTE — ED Notes (Signed)
Family reports recently starting cyanacobalomin injections.

## 2017-10-09 NOTE — ED Triage Notes (Addendum)
Pt in c/o diarrhea onset last night with 5-10 loose stools in the last 24 hours, pt c/o nausea, pt c/o abd pain bil upper & lower quadrants, pt A&O x4, pts family reports having CT completed at [facility], pt had fecal sample sent last Friday to PCP office to check for blood per pts family

## 2017-10-09 NOTE — ED Provider Notes (Signed)
[facility] EMERGENCY DEPARTMENT Provider Note   CSN: [id number] Arrival date & time: 10/09/17  1217     History   Chief Complaint Chief Complaint  Patient presents with  . Diarrhea    HPI Mario Smith is a 77 y.o. male.  HPI   Pt Is a 77 year old male with history of grave's disease, HTN, HLD, who presents to the ED today to be evaluated for nausea, abdominal pain, and diarrhea.  Patient's family states he has had nausea and decreased appetite for the last 3 months.  Nausea is worse with eating.  He has had no episodes of vomiting.  He has also had epigastric and suprapubic abdominal pain intermittently for the same time period.  He also reports diarrhea that began last night.  Reports about 5-10 stools total.  Denies any blood in his stool.  Denies any recent antibiotic use.  Denies fevers, chest pain, shortness of breath.  Does note that he has felt generally weak during the last few weeks because he has had decreased p.o. intake.  Of note, patient had contacted his PCP about his symptoms and FOBT and abdominal ultrasound were ordered.  FOBT has not resulted on review of records however abdominal ultrasound completed earlier today showed borderline gallbladder wall thickening which could be suggestive of acute cholecystitis.  Ultrasound of pelvis was also completed today and showed no gross abnormalities.  Past Medical History:  Diagnosis Date  . Back pain   . Colon polyp   . ED (erectile dysfunction)   . Essential hypertension, benign   . Graves disease   . Other and unspecified hyperlipidemia   . PONV (postoperative nausea and vomiting)   . Thrombocytopenia [facility])     Patient Active Problem List   Diagnosis Date Noted  . B12 deficiency 10/06/2017  . Graves disease 10/27/2016  . Elevated PSA 10/27/2016  . Thrombocytopenia ([place]) 06/25/2016  . Hyperthyroidism 02/24/2016  . Hyperlipidemia 05/27/2013  . Vitamin D deficiency 05/27/2013  . Hypertension  02/11/2010    Past Surgical History:  Procedure Laterality Date  . CATARACT EXTRACTION    . COLONOSCOPY    . SPINAL FUSION  2001   cervical  . TENDON REPAIR Right 05/16/2014   Procedure: RIGHT RING FINGER FLEXOR TENDON REPAIR WITH PULL OUT BUTTON;  Surgeon: Roseanne Kaufman, MD;  Location: [facility];  Service: Orthopedics;  Laterality: Right;       Home Medications    Prior to Admission medications   Medication Sig Start Date End Date Taking? Authorizing Provider  aspirin 81 MG EC tablet Take 81 mg by mouth daily.     Yes [provider]  Cal Carb-Mag Hydrox-Simeth (ROLAIDS ADVANCED PO) Take 2-4 tablets by mouth as needed (indigestion).   Yes [provider]  carboxymethylcellulose (REFRESH PLUS) 0.5 % SOLN Place 1 drop into both eyes as needed (dry eyes).   Yes [provider]  Cholecalciferol (VITAMIN D3) 2000 UNITS TABS Take 1 tablet by mouth daily.     Yes [provider]  Coenzyme Q10 (CO Q 10 PO) Take 2 capsules by mouth daily.    Yes [provider]  fluticasone (FLONASE) 50 MCG/ACT nasal spray USE 2 SPRAYS IN EACH NOSTRIL ONCE DAILY 08/09/17  Yes Chipper Herb, MD  hydrochlorothiazide (MICROZIDE) 12.5 MG capsule TAKE 1 CAPSULE BY MOUTH ONCE DAILY 06/12/17  Yes Chipper Herb, MD  methimazole (TAPAZOLE) 5 MG tablet Take 10 mg by mouth 3 (three) times a  week.  12/03/15  Yes [provider]  metoprolol tartrate (LOPRESSOR) 25 MG tablet Take 1 tablet (25 mg total) by mouth 2 (two) times daily. 12/14/16  Yes Timmothy Euler, MD  Omega-3 Fatty Acids (FISH OIL) 1000 MG CAPS Take 1 capsule by mouth 2 (two) times daily.     Yes [provider]  omeprazole (PRILOSEC) 40 MG capsule Take 1 capsule (40 mg total) by mouth daily. 09/08/17  Yes Timmothy Euler, MD  Selenium 200 MCG TABS Take 1 tablet by mouth daily.    Yes [provider]  budesonide-formoterol (SYMBICORT) 80-4.5 MCG/ACT inhaler Inhale 2  puffs into the lungs 2 (two) times daily. 05/03/17 06/02/17  Timmothy Euler, MD  meclizine (ANTIVERT) 25 MG tablet TAKE 1 TABLET (25 MG TOTAL) BY MOUTH 3 (THREE) TIMES DAILY AS NEEDED FOR DIZZINESS. Patient not taking: Reported on 10/04/2017 09/28/17   Chipper Herb, MD  mupirocin ointment (BACTROBAN) 2 % Place 1 application into the nose 2 (two) times daily. Patient not taking: Reported on 10/09/2017 07/28/17   Chipper Herb, MD  promethazine (PHENERGAN) 25 MG tablet Take 1 tablet (25 mg total) by mouth every 8 (eight) hours as needed for nausea or vomiting. Patient not taking: Reported on 10/09/2017 10/02/17   Terald Sleeper, PA-C  telmisartan (MICARDIS) 80 MG tablet Take 1 tablet (80 mg total) by mouth daily. 08/21/17   Chevis Pretty, FNP    Family History Family History  Problem Relation Age of Onset  . Heart disease Mother   . COPD Mother   . Heart disease Father   . COPD Father   . Alcohol abuse Father   . Cancer Sister        colon cancer  . Emphysema Sister   . Cancer Brother        prostate  . Emphysema Sister   . Prostate cancer Brother   . Healthy Daughter     Social History Social History   Tobacco Use  . Smoking status: Former Smoker    Types: Cigarettes    Last attempt to quit: 12/19/1992    Years since quitting: 24.8  . Smokeless tobacco: Never Used  Substance Use Topics  . Alcohol use: No  . Drug use: No     Allergies   Codeine; Hydrocodone; Meclizine; Niacin and related; Niaspan [niacin er]; Ondansetron; Phenergan [promethazine hcl]; Simvastatin; and Sulfa antibiotics   Review of Systems Review of Systems  Constitutional: Positive for appetite change and fatigue. Negative for fever.  HENT: Negative for sore throat.   Eyes: Negative for visual disturbance.  Respiratory: Negative for cough and shortness of breath.   Cardiovascular: Negative for chest pain.  Gastrointestinal: Positive for abdominal pain, diarrhea and nausea. Negative for blood  in stool, constipation and vomiting.  Genitourinary: Negative for dysuria, flank pain, frequency, hematuria and urgency.  Musculoskeletal: Negative for myalgias.  Skin: Negative for wound.  Neurological: Negative for dizziness, weakness, light-headedness, numbness and headaches.   Physical Exam Updated Vital Signs BP (!) 108/56   Pulse 84   Temp 98 F (36.7 C) (Oral)   Resp 19   SpO2 100%   Physical Exam  Constitutional: He appears well-developed and well-nourished. No distress.  HENT:  Head: Normocephalic and atraumatic.  Mouth/Throat: Oropharynx is clear and moist.  Eyes: Pupils are equal, round, and reactive to light. Conjunctivae and EOM are normal. No scleral icterus.  Neck: Neck supple.  Cardiovascular: Normal rate, regular rhythm and normal heart sounds.  No murmur heard. Pulmonary/Chest: Effort normal and breath sounds normal. No respiratory distress. He has no wheezes.  Abdominal: Soft. Bowel sounds are normal. He exhibits no distension. There is no rebound and no guarding.  Mild diffuse ttp  Musculoskeletal: He exhibits no edema.  Neurological: He is alert.  Skin: Skin is warm and dry. Capillary refill takes less than 2 seconds.  Psychiatric: He has a normal mood and affect.  Nursing note and vitals reviewed.  ED Treatments / Results  Labs (all labs ordered are listed, but only abnormal results are displayed) Labs Reviewed  LIPASE, BLOOD - Abnormal; Notable for the following components:      Result Value   Lipase 65 (*)    All other components within normal limits  COMPREHENSIVE METABOLIC PANEL - Abnormal; Notable for the following components:   Creatinine, Ser 1.41 (*)    ALT 11 (*)    GFR calc non Af Amer 47 (*)    GFR calc Af Amer 54 (*)    All other components within normal limits  CBC - Abnormal; Notable for the following components:   RBC 2.83 (*)    Hemoglobin 10.9 (*)    HCT 31.2 (*)    MCV 110.2 (*)    MCH 38.5 (*)    RDW 17.7 (*)    All other  components within normal limits  URINALYSIS, ROUTINE W REFLEX MICROSCOPIC - Abnormal; Notable for the following components:   APPearance HAZY (*)    Bilirubin Urine SMALL (*)    Ketones, ur 5 (*)    Protein, ur 30 (*)    All other components within normal limits    EKG None  Radiology US Abdomen Complete  Result Date: 10/09/2017 CLINICAL DATA:  Nausea and diarrhea for 3 months.  Weight loss. EXAM: ABDOMEN ULTRASOUND COMPLETE COMPARISON:  None. FINDINGS: Gallbladder: Borderline gallbladder wall thickening with maximum transverse thickness of 3.8 mm. No pericholecystic fluid. Moderate amount of gallbladder sludge. No sonographic Murphy sign noted by sonographer. Common bile duct: Diameter: 6.3 mm Liver: No focal lesion identified. Within normal limits in parenchymal echogenicity. Two calcified granulomata noted. Portal vein is patent on color Doppler imaging with normal direction of blood flow towards the liver. IVC: No abnormality visualized. Pancreas: Visualized portion unremarkable. Spleen: Size and appearance within normal limits. Right Kidney: Length: 9.9 cm. Echogenicity within normal limits. No mass or hydronephrosis visualized. Left Kidney: Length: 10.5 cm. Echogenicity within normal limits. No mass or hydronephrosis visualized. Abdominal aorta: No aneurysm visualized. Other findings: None. IMPRESSION: Gallbladder sludge with borderline gallbladder wall thickening, which in the appropriate clinical setting is suggestive of acute cholecystitis. Otherwise no acute abnormalities within the abdomen. These results will be called to the ordering clinician or representative by the Radiologist Assistant, and communication documented in the PACS or zVision Dashboard. Electronically Signed   By: Fidela Salisbury M.D.   On: 10/09/2017 11:02   US Pelvis Limited (transabdominal Only)  Result Date: 10/09/2017 CLINICAL DATA:  Evaluation of urinary bladder. EXAM: LIMITED ULTRASOUND OF PELVIS TECHNIQUE:  Limited transabdominal ultrasound examination of the pelvis was performed. COMPARISON:  None. FINDINGS: The urinary bladder is nearly collapsed and therefore poorly evaluated. IMPRESSION: Suboptimal visualization of the urinary bladder due to its collapsed state. No gross abnormalities. Electronically Signed   By: Fidela Salisbury M.D.   On: 10/09/2017 11:04   Ct Abdomen Pelvis W Contrast  Result Date: 10/10/2017 CLINICAL DATA:  The patient states he has not been feeling well for over a  month. Abdominal pain with loss of appetite. EXAM: CT ABDOMEN AND PELVIS WITH CONTRAST TECHNIQUE: Multidetector CT imaging of the abdomen and pelvis was performed using the standard protocol following bolus administration of intravenous contrast. CONTRAST:  169mL OMNIPAQUE IOHEXOL 300 MG/ML  SOLN COMPARISON:  Abdominal ultrasound from 10/09/2017 FINDINGS: Lower chest: The included heart is normal in size without pericardial effusion. No active pulmonary disease. Hepatobiliary: No focal liver abnormality is seen. No gallstones, gallbladder wall thickening, or biliary dilatation. Probable minimal biliary sludge within. The gallbladder is nondistended in appearance. Pancreas: Unremarkable. No pancreatic ductal dilatation or surrounding inflammatory changes. Spleen: Normal in size without focal abnormality. Adrenals/Urinary Tract: Adrenal glands are unremarkable. Kidneys are normal, without renal calculi, focal lesion, or hydronephrosis. Bladder is unremarkable. Stomach/Bowel: Stomach is within normal limits. Appendix appears normal. No evidence of bowel wall thickening, distention, or inflammatory changes. Vascular/Lymphatic: Moderate aortoiliac atherosclerosis. Patent branch vessels. No aneurysm. No lymphadenopathy. Reproductive: Normal size prostate with central zone calcifications. Other: No ascites or free air. Musculoskeletal: Marked degenerative disc flattening and vacuum disc phenomena from L4 through S1. No pars defects or  listhesis. Lower lumbar degenerative facet arthropathy is identified. Bones are demineralized in appearance. Right SI joint osteoarthritis with sclerosis. 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 No acute solid nor hollow visceral organ abnormality. Electronically Signed   By: Ashley Royalty M.D.   On: 10/10/2017 01:43   Procedures Procedures (including critical care time)  Medications Ordered in ED Medications  sodium chloride 0.9 % bolus 500 mL (0 mLs Intravenous Stopped 10/10/17 0040)  iohexol (OMNIPAQUE) 300 MG/ML solution 100 mL (100 mLs Intravenous Contrast Given 10/10/17 0021)     Initial Impression / Assessment and Plan / ED Course  I have reviewed the triage vital signs and the nursing notes.  Pertinent labs & imaging results that were available during my care of the patient were reviewed by me and considered in my medical decision making (see chart for details).    Discussed pt presentation and exam findings with Dr. Gilford Raid, who personally evaluated the pt and advises to obtain ct abd/pelvis. If negative for additional intraabdominal pathology will plan to consult gen surgery to evaluate pt for cholecystitis.   2:23 PM CONSULT with Dr. Theda Sers via nurse as she is in surgery. Dicussed pt presentation, lab findings, imaging studies. Dr Theda Sers will see the patient after she finishes surgery.   Final Clinical Impressions(s) / ED Diagnoses   Final diagnoses:  None   77 year old male presenting with colicky abdominal pain for the last 3 months.  Associated with nausea as well as diarrhea.  Has history of GERD.  Vital signs are stable here and he is afebrile.  Abdominal exam has some tenderness, but no rigidity or guarding.  Lab work shows no leukocytosis.  Mild anemia that is stable.  Liver function is normal.  Creatinine is slightly elevated however is not severely elevated above baseline.  Lipase is slightly elevated.  UA was without nitrites or leukocytes.  Does  have some ketonuria and proteinuria.  Small amount of bilirubin noted.  Ultrasound that was ordered by patient's PCP earlier today showed evidence of possible cholecystitis.  Ultrasound of pelvis negative.  CT abdomen pelvis negative for acute intra-abdominal process.  Given patient's ultrasound read general surgery was consulted who will see the patient.  Prior to general surgery consult, patient care was signed out to Benedetto Goad, PA-C who will follow up with general surgery's recommendations.  If patient is cleared by general  surgery plan is to send him home with Carafate and have him follow-up closely with his pcp.    ED Discharge Orders    None       Rodney Booze, PA-C 10/10/17 0300    Isla Pence, MD 10/10/17 1651

## 2017-10-10 ENCOUNTER — Encounter (HOSPITAL_COMMUNITY): Payer: Self-pay | Admitting: Nurse Practitioner

## 2017-10-10 ENCOUNTER — Emergency Department (HOSPITAL_COMMUNITY): Payer: Medicare HMO

## 2017-10-10 ENCOUNTER — Observation Stay (HOSPITAL_COMMUNITY): Payer: Medicare HMO

## 2017-10-10 ENCOUNTER — Other Ambulatory Visit: Payer: Self-pay

## 2017-10-10 DIAGNOSIS — E0590 Thyrotoxicosis, unspecified without thyrotoxic crisis or storm: Secondary | ICD-10-CM | POA: Diagnosis present

## 2017-10-10 DIAGNOSIS — I10 Essential (primary) hypertension: Secondary | ICD-10-CM | POA: Diagnosis present

## 2017-10-10 DIAGNOSIS — D696 Thrombocytopenia, unspecified: Secondary | ICD-10-CM | POA: Diagnosis not present

## 2017-10-10 DIAGNOSIS — E860 Dehydration: Secondary | ICD-10-CM

## 2017-10-10 DIAGNOSIS — D539 Nutritional anemia, unspecified: Secondary | ICD-10-CM | POA: Diagnosis not present

## 2017-10-10 DIAGNOSIS — R109 Unspecified abdominal pain: Secondary | ICD-10-CM | POA: Diagnosis not present

## 2017-10-10 DIAGNOSIS — R1111 Vomiting without nausea: Secondary | ICD-10-CM | POA: Diagnosis not present

## 2017-10-10 DIAGNOSIS — R42 Dizziness and giddiness: Secondary | ICD-10-CM

## 2017-10-10 DIAGNOSIS — R110 Nausea: Secondary | ICD-10-CM | POA: Diagnosis not present

## 2017-10-10 DIAGNOSIS — R933 Abnormal findings on diagnostic imaging of other parts of digestive tract: Secondary | ICD-10-CM | POA: Diagnosis not present

## 2017-10-10 DIAGNOSIS — E785 Hyperlipidemia, unspecified: Secondary | ICD-10-CM | POA: Diagnosis present

## 2017-10-10 DIAGNOSIS — K2900 Acute gastritis without bleeding: Secondary | ICD-10-CM | POA: Diagnosis not present

## 2017-10-10 DIAGNOSIS — R1013 Epigastric pain: Secondary | ICD-10-CM

## 2017-10-10 DIAGNOSIS — D649 Anemia, unspecified: Secondary | ICD-10-CM | POA: Diagnosis present

## 2017-10-10 DIAGNOSIS — K3189 Other diseases of stomach and duodenum: Secondary | ICD-10-CM | POA: Diagnosis not present

## 2017-10-10 HISTORY — DX: Unspecified abdominal pain: R10.9

## 2017-10-10 HISTORY — DX: Dehydration: E86.0

## 2017-10-10 LAB — PHOSPHORUS: Phosphorus: 2.8 mg/dL (ref 2.5–4.6)

## 2017-10-10 LAB — MAGNESIUM: MAGNESIUM: 2 mg/dL (ref 1.7–2.4)

## 2017-10-10 LAB — FECAL OCCULT BLOOD, IMMUNOCHEMICAL: Fecal Occult Bld: NEGATIVE

## 2017-10-10 MED ORDER — SODIUM CHLORIDE 0.9 % IV SOLN
INTRAVENOUS | Status: DC
  Administered 2017-10-10 – 2017-10-12 (×3): via INTRAVENOUS

## 2017-10-10 MED ORDER — THIAMINE HCL 100 MG/ML IJ SOLN
100.0000 mg | Freq: Every day | INTRAMUSCULAR | Status: DC
  Administered 2017-10-10 – 2017-10-12 (×3): 100 mg via INTRAVENOUS
  Filled 2017-10-10 (×3): qty 2

## 2017-10-10 MED ORDER — FOLIC ACID 5 MG/ML IJ SOLN
1.0000 mg | Freq: Every day | INTRAMUSCULAR | Status: DC
  Administered 2017-10-10 – 2017-10-12 (×3): 1 mg via INTRAVENOUS
  Filled 2017-10-10 (×3): qty 0.2

## 2017-10-10 MED ORDER — SODIUM CHLORIDE 0.9% FLUSH
3.0000 mL | Freq: Two times a day (BID) | INTRAVENOUS | Status: DC
  Administered 2017-10-10: 3 mL via INTRAVENOUS

## 2017-10-10 MED ORDER — POLYVINYL ALCOHOL 1.4 % OP SOLN
1.0000 [drp] | OPHTHALMIC | Status: DC | PRN
  Filled 2017-10-10: qty 15

## 2017-10-10 MED ORDER — ACETAMINOPHEN 325 MG PO TABS: 650.0000 mg | ORAL_TABLET | Freq: Four times a day (QID) | ORAL | Status: DC | PRN

## 2017-10-10 MED ORDER — SODIUM CHLORIDE 0.9 % IV BOLUS
500.0000 mL | Freq: Once | INTRAVENOUS | Status: AC
  Administered 2017-10-10: 500 mL via INTRAVENOUS

## 2017-10-10 MED ORDER — HYPROMELLOSE (GONIOSCOPIC) 2.5 % OP SOLN
1.0000 [drp] | OPHTHALMIC | Status: DC | PRN
  Filled 2017-10-10: qty 15

## 2017-10-10 MED ORDER — METHIMAZOLE 10 MG PO TABS
10.0000 mg | ORAL_TABLET | ORAL | Status: DC
  Administered 2017-10-11: 10 mg via ORAL
  Filled 2017-10-10: qty 1

## 2017-10-10 MED ORDER — MORPHINE SULFATE (PF) 4 MG/ML IV SOLN: 1.0000 mg | INTRAVENOUS | Status: DC | PRN

## 2017-10-10 MED ORDER — FLUTICASONE PROPIONATE 50 MCG/ACT NA SUSP
2.0000 | Freq: Every day | NASAL | Status: DC
  Administered 2017-10-11 – 2017-10-12 (×2): 2 via NASAL
  Filled 2017-10-10: qty 16

## 2017-10-10 MED ORDER — IOHEXOL 300 MG/ML  SOLN
100.0000 mL | Freq: Once | INTRAMUSCULAR | Status: AC | PRN
  Administered 2017-10-10: 100 mL via INTRAVENOUS

## 2017-10-10 MED ORDER — ENOXAPARIN SODIUM 40 MG/0.4ML ~~LOC~~ SOLN
40.0000 mg | SUBCUTANEOUS | Status: DC
  Administered 2017-10-10 – 2017-10-12 (×3): 40 mg via SUBCUTANEOUS
  Filled 2017-10-10 (×3): qty 0.4

## 2017-10-10 MED ORDER — CARBOXYMETHYLCELLULOSE SODIUM 0.5 % OP SOLN: 1.0000 [drp] | OPHTHALMIC | Status: DC | PRN

## 2017-10-10 MED ORDER — ONDANSETRON HCL 4 MG/2ML IJ SOLN: 4.0000 mg | Freq: Four times a day (QID) | INTRAMUSCULAR | Status: DC | PRN

## 2017-10-10 MED ORDER — FAMOTIDINE IN NACL 20-0.9 MG/50ML-% IV SOLN
20.0000 mg | Freq: Two times a day (BID) | INTRAVENOUS | Status: DC
  Administered 2017-10-10 – 2017-10-12 (×5): 20 mg via INTRAVENOUS
  Filled 2017-10-10 (×4): qty 50

## 2017-10-10 MED ORDER — BOOST / RESOURCE BREEZE PO LIQD CUSTOM
1.0000 | Freq: Three times a day (TID) | ORAL | Status: DC
  Administered 2017-10-11 – 2017-10-12 (×2): 1 via ORAL
  Filled 2017-10-10 (×9): qty 1

## 2017-10-10 MED ORDER — PROMETHAZINE HCL 25 MG/ML IJ SOLN: 6.2500 mg | Freq: Four times a day (QID) | INTRAMUSCULAR | Status: DC | PRN

## 2017-10-10 MED ORDER — MOMETASONE FURO-FORMOTEROL FUM 100-5 MCG/ACT IN AERO
2.0000 | INHALATION_SPRAY | Freq: Two times a day (BID) | RESPIRATORY_TRACT | Status: DC
  Filled 2017-10-10: qty 8.8

## 2017-10-10 MED ORDER — TECHNETIUM TC 99M MEBROFENIN IV KIT
5.0000 | PACK | Freq: Once | INTRAVENOUS | Status: AC | PRN
  Administered 2017-10-10: 5 via INTRAVENOUS

## 2017-10-10 MED ORDER — ACETAMINOPHEN 650 MG RE SUPP: 650.0000 mg | Freq: Four times a day (QID) | RECTAL | Status: DC | PRN

## 2017-10-10 NOTE — ED Notes (Signed)
Patient resting in bed, awaiting evaluation from general surgery.

## 2017-10-10 NOTE — H&P (View-Only) (Signed)
[facility] Consult: 11:12 AM 10/10/2017  LOS: 0 days    Referring Provider: Dr Evangeline Gula  Primary Care Physician:  Chipper Herb, MD Primary Gastroenterologist:  Dr Deatra Ina >> [place].       Reason for Consultation: Nausea.   HPI: Mario Smith is a 77 y.o. male.  PMH Graves dz, followed at [facility].  Htn.  Deg spine dz, s/p cervical fusion.  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 S/p 09/25/17 bil ophthalmology surgery to "fix the muscles behind his eye" (right superior rectus recession, left inferior rectus recession).  Multiple medication allergies.   Macrocytosis, without anemia, dates to 01/2016.   03/2010 Colonscopy.  Higher risk screening study secondary to sister's hx of colon CA.   5 mm sessile polyp (TA without HGD) descending colon.   Has not undergone repeat surveillance colonoscopy but he has an upcoming appointment on 11/22/17 for colonoscopy with Dr Hilarie Fredrickson.  Appointment was set a few months ago, before onset of current troubles.  Starting about 2 months ago, patient developed intermittent, but frequent episodes, of vertigo.  These were associated with nausea.  During severe bouts of vertigo he vomited but not often.  Never coffee-ground or bloody emesis.   During this timeframe he also had intermittent spells of liquid, brown stools interspersed around formed stools.  No abdominal pain. Chronically compliant with  omeprazole 40 mg a day.  Meclizine helped the dizziness, but eventually, his wife tells me, this began to cause jerkiness/tremors..  Ondansetron and promethazine both caused jittery, jerking of his hands and tremors so he was not able to take these. Patient previously used ibuprofen at most a few times a month but none in several weeks.  No aspirin.  No dysphagia.  6 #  weight loss in the last 10 days.  Feels very weak and lacking in energy but no gait or balance disturbance.  Patient has only recently been evaluated for this by PMD.  He has not spoken with his endocrinologist about these issues. Within the past 10 - 14 days the vertigo has abated but for the last 3 weeks the nausea has been nonstop.  He does not vomit much.  His diet mostly consists of Gatorade and crackers. Seen by Dr. Laurance Flatten last week.  Office visit with GI Pa-C was arranged for 5/13.  On Friday he received the first injection of vitamin B12 because labs showed B12 deficiency.  Plan is for him to have weekly B12 injections for 4 weeks and monthly thereafter.  However he is gotten so weak some nauseous that his family brought him to the emergency room yesterday.  CT head 03/2017.  For evaluation of headache and sinusitis.  No acute intracranial abnormality.  Atrophy, chronic microvascular disease. Abd ultrasound 10/09/17: GB sludge, borderline GB thickening.  6.3 mm CBD.  CT abdomen/pelvis with contrast 10/10/17.  Minor GB sludge.   Moderate aortoiliac atherosclerosis with patent branch vessels. No acute solid nor hollow visceral organ abnormality.  Lumbar DDD/DJD.   HIDA scan ordered by Dr Kae Heller from gen surg.  Lipase slightly elevated at 65 (RR 11 - 51).  LFTS at or below normal.   Hgb 10.9 with MCV 110.  Platelets 126 - 155  No family history of gallbladder disease.  Patient does not drink alcohol.  He quit smoking 30 to 40 years ago.  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   Past Medical History:  Diagnosis Date  . Back pain   . Colon polyp   . ED (erectile dysfunction)   . Essential hypertension, benign   . Gallbladder sludge 10/2017  . Graves disease   . HTN (hypertension)   . Other and unspecified hyperlipidemia   . PONV (postoperative nausea and vomiting)   . Thrombocytopenia ([place])   . Vertigo     Past  Surgical History:  Procedure Laterality Date  . CATARACT EXTRACTION    . COLONOSCOPY  03/2010    Dr Thomasenia Sales, [facility]. Higher risk screening study, sister's hx of colon CA.    Marland Kitchen SPINAL FUSION  2001   cervical  . TENDON REPAIR Right 05/16/2014   Procedure: RIGHT RING FINGER FLEXOR TENDON REPAIR WITH PULL OUT BUTTON;  Surgeon: Roseanne Kaufman, MD;  Location: [facility];  Service: Orthopedics;  Laterality: Right;    Prior to Admission medications   Medication Sig Start Date End Date Taking? Authorizing Provider  aspirin 81 MG EC tablet Take 81 mg by mouth daily.     Yes [provider]  Cal Carb-Mag Hydrox-Simeth (ROLAIDS ADVANCED PO) Take 2-4 tablets by mouth as needed (indigestion).   Yes [provider]  carboxymethylcellulose (REFRESH PLUS) 0.5 % SOLN Place 1 drop into both eyes as needed (dry eyes).   Yes [provider]  Cholecalciferol (VITAMIN D3) 2000 UNITS TABS Take 1 tablet by mouth daily.     Yes [provider]  Coenzyme Q10 (CO Q 10 PO) Take 2 capsules by mouth daily.    Yes [provider]  fluticasone (FLONASE) 50 MCG/ACT nasal spray USE 2 SPRAYS IN EACH NOSTRIL ONCE DAILY 08/09/17  Yes Chipper Herb, MD  hydrochlorothiazide (MICROZIDE) 12.5 MG capsule TAKE 1 CAPSULE BY MOUTH ONCE DAILY 06/12/17  Yes Chipper Herb, MD  methimazole (TAPAZOLE) 5 MG tablet Take 10 mg by mouth 3 (three) times a week.  12/03/15  Yes [provider]  metoprolol tartrate (LOPRESSOR) 25 MG tablet Take 1 tablet (25 mg total) by mouth 2 (two) times daily. 12/14/16  Yes Timmothy Euler, MD  Omega-3 Fatty Acids (FISH OIL) 1000 MG CAPS Take 1 capsule by mouth 2 (two) times daily.     Yes [provider]  omeprazole (PRILOSEC) 40 MG capsule Take 1 capsule (40 mg total) by mouth daily. 09/08/17  Yes Timmothy Euler, MD  Selenium 200 MCG TABS Take 1 tablet by mouth daily.    Yes [provider]  budesonide-formoterol  (SYMBICORT) 80-4.5 MCG/ACT inhaler Inhale 2 puffs into the lungs 2 (two) times daily. 05/03/17 06/02/17  Timmothy Euler, MD  meclizine (ANTIVERT) 25 MG tablet TAKE 1 TABLET (25 MG TOTAL) BY MOUTH 3 (THREE) TIMES DAILY AS NEEDED FOR DIZZINESS. Patient not taking: Reported on 10/04/2017 09/28/17   Chipper Herb, MD  mupirocin ointment (BACTROBAN) 2 % Place 1 application into the nose 2 (two) times daily. Patient not taking: Reported on 10/09/2017 07/28/17   Chipper Herb, MD  promethazine (PHENERGAN) 25 MG tablet Take 1  tablet (25 mg total) by mouth every 8 (eight) hours as needed for nausea or vomiting. Patient not taking: Reported on 10/09/2017 10/02/17   Terald Sleeper, PA-C  telmisartan (MICARDIS) 80 MG tablet Take 1 tablet (80 mg total) by mouth daily. 08/21/17   Hassell Done Mary-Margaret, FNP    Scheduled Meds: . enoxaparin (LOVENOX) injection  40 mg Subcutaneous Q24H  . fluticasone  2 spray Each Nare Daily  . folic acid  1 mg Intravenous Daily  . [START ON 10/11/2017] methimazole  10 mg Oral Once per day on Mon Wed Fri  . mometasone-formoterol  2 puff Inhalation BID  . sodium chloride flush  3 mL Intravenous Q12H  . thiamine injection  100 mg Intravenous Daily   Infusions: . sodium chloride 100 mL/hr at 10/10/17 1012  . famotidine (PEPCID) IV 20 mg (10/10/17 1007)   PRN Meds: acetaminophen **OR** acetaminophen, morphine injection, ondansetron (ZOFRAN) IV **OR** promethazine, polyvinyl alcohol   Allergies as of 10/09/2017 - Review Complete 10/09/2017  Allergen Reaction Noted  . Codeine    . Hydrocodone    . Meclizine Nausea Only 10/09/2017  . Niacin and related Other (See Comments) 10/09/2017  . Niaspan [niacin er] Swelling 09/09/2010  . Ondansetron Other (See Comments) 10/09/2017  . Phenergan [promethazine hcl] Other (See Comments) 10/09/2017  . Simvastatin Other (See Comments) 09/09/2010  . Sulfa antibiotics  04/22/2014    Family History  Problem Relation Age of Onset  . Heart  disease Mother   . COPD Mother   . Heart disease Father   . COPD Father   . Alcohol abuse Father   . Cancer Sister        colon cancer  . Emphysema Sister   . Cancer Brother        prostate  . Emphysema Sister   . Prostate cancer Brother   . Healthy Daughter     Social History   Socioeconomic History  . Marital status: Married    Spouse name: Not on file  . Number of children: Not on file  . Years of education: Not on file  . Highest education level: Not on file  Occupational History  . Not on file  Social Needs  . Financial resource strain: Not on file  . Food insecurity:    Worry: Not on file    Inability: Not on file  . Transportation needs:    Medical: Not on file    Non-medical: Not on file  Tobacco Use  . Smoking status: Former Smoker    Types: Cigarettes    Last attempt to quit: 12/19/1992    Years since quitting: 24.8  . Smokeless tobacco: Never Used  Substance and Sexual Activity  . Alcohol use: No  . Drug use: No  . Sexual activity: Not Currently  Lifestyle  . Physical activity:    Days per week: Not on file    Minutes per session: Not on file  . Stress: Not on file  Relationships  . Social connections:    Talks on phone: Not on file    Gets together: Not on file    Attends religious service: Not on file    Active member of club or organization: Not on file    Attends meetings of clubs or organizations: Not on file    Relationship status: Not on file  . Intimate partner violence:    Fear of current or ex partner: Not on file    Emotionally abused: Not on file  Physically abused: Not on file    Forced sexual activity: Not on file  Other Topics Concern  . Not on file  Social History Narrative  . Not on file    REVIEW OF SYSTEMS: Constitutional: Tired and weak. ENT:  No nose bleeds Pulm: No difficulty breathing.  No cough. CV:  No palpitations, no LE edema.  GU:  No hematuria, no frequency GI:  Per HPI Heme: No excessive or unusual  bleeding or bruising.  Previous issues with anemia.  Recently started on B12 shots as per HPI.   Transfusions: No previous transfusions. Neuro:  No headaches, no peripheral tingling or numbness Derm:  No itching, no rash or sores.  Endocrine:  No sweats or chills.  No polyuria or dysuria Immunization: Reviewed.  He is up-to-date on multiple vaccinations including but not limited to flu and Pneumovax. Travel:  None beyond local counties in last few months.    PHYSICAL EXAM: Vital signs in last 24 hours: Vitals:   10/10/17 1000 10/10/17 1100  BP: (!) 124/58 (!) 118/91  Pulse: 69 86  Resp: 17 16  Temp:    SpO2: (!) 86% 90%   Wt Readings from Last 3 Encounters:  10/04/17 166 lb (75.3 kg)  09/08/17 172 lb 9.6 oz (78.3 kg)  08/17/17 174 lb (78.9 kg)    General: Patient looks mildly ill but comfortable.  Wearing glasses.  The right blast lens is frosted.  Left exophthalmos. Head: No facial asymmetry or swelling.  No signs of head trauma. Eyes: Icterus.  No conjunctival pallor.  EOMI. Ears:  HOH  Nose: No congestion or discharge. Mouth: Moist, pink, clear oral MM.  Midline.  Good dentition. Neck: No JVD, no thyromegaly, no masses. Lungs: Clear bilaterally.  No cough.  No labored breathing. Heart: RRR.  No MRG.  S1, S2 present. Abdomen: Soft.  Not tender or distended.  Active bowel sounds.  No masses, bruits, hernias, organomegaly..   Rectal: Deferred Musc/Skeltl: No joint redness or swelling. Extremities: No CCE. Neurologic:  HOH.  Resting tremor of the right hand which worsens with intention.  Tremor in the left hand.  No limb weakness.  Moves all 4 limbs.  Lower extremity tremor.  Oriented x3. Skin: No rashes, no sores, no bronzing, no telangiectasia. Tattoos: None observed. Nodes: No cervical adenopathy. Psych: Cooperative, calm, pleasant.  Intake/Output from previous day: 05/06 0701 - 05/07 0700 In: 500 [IV Piggyback:500] Out: -  Intake/Output this shift: No intake/output  data recorded.  LAB RESULTS: Recent Labs    10/09/17 1254  WBC 4.1  HGB 10.9*  HCT 31.2*  PLT 155   BMET Lab Results  Component Value Date   NA 143 10/09/2017   NA 141 10/04/2017   NA 143 07/25/2017   K 3.6 10/09/2017   K 4.3 10/04/2017   K 4.4 07/25/2017   CL 109 10/09/2017   CL 102 10/04/2017   CL 104 07/25/2017   CO2 27 10/09/2017   CO2 23 10/04/2017   CO2 27 07/25/2017   GLUCOSE 94 10/09/2017   GLUCOSE 94 10/04/2017   GLUCOSE 94 07/25/2017   BUN 16 10/09/2017   BUN 16 10/04/2017   BUN 15 07/25/2017   CREATININE 1.41 (H) 10/09/2017   CREATININE 1.33 (H) 10/04/2017   CREATININE 1.20 07/25/2017   CALCIUM 9.3 10/09/2017   CALCIUM 9.1 10/04/2017   CALCIUM 9.5 07/25/2017   LFT Recent Labs    10/09/17 1254  PROT 6.5  ALBUMIN 3.8  AST 16  ALT  11*  ALKPHOS 50  BILITOT 0.5   PT/INR No results found for: INR, PROTIME Hepatitis Panel No results for input(s): HEPBSAG, HCVAB, HEPAIGM, HEPBIGM in the last 72 hours. C-Diff No components found for: CDIFF Lipase     Component Value Date/Time   LIPASE 65 (H) 10/09/2017 1254    Drugs of Abuse  No results found for: LABOPIA, COCAINSCRNUR, LABBENZ, AMPHETMU, THCU, LABBARB   RADIOLOGY STUDIES: US Abdomen Complete  Result Date: 10/09/2017 CLINICAL DATA:  Nausea and diarrhea for 3 months.  Weight loss. EXAM: ABDOMEN ULTRASOUND COMPLETE COMPARISON:  None. FINDINGS: Gallbladder: Borderline gallbladder wall thickening with maximum transverse thickness of 3.8 mm. No pericholecystic fluid. Moderate amount of gallbladder sludge. No sonographic Murphy sign noted by sonographer. Common bile duct: Diameter: 6.3 mm Liver: No focal lesion identified. Within normal limits in parenchymal echogenicity. Two calcified granulomata noted. Portal vein is patent on color Doppler imaging with normal direction of blood flow towards the liver. IVC: No abnormality visualized. Pancreas: Visualized portion unremarkable. Spleen: Size and  appearance within normal limits. Right Kidney: Length: 9.9 cm. Echogenicity within normal limits. No mass or hydronephrosis visualized. Left Kidney: Length: 10.5 cm. Echogenicity within normal limits. No mass or hydronephrosis visualized. Abdominal aorta: No aneurysm visualized. Other findings: None. IMPRESSION: Gallbladder sludge with borderline gallbladder wall thickening, which in the appropriate clinical setting is suggestive of acute cholecystitis. Otherwise no acute abnormalities within the abdomen. These results will be called to the ordering clinician or representative by the Radiologist Assistant, and communication documented in the PACS or zVision Dashboard. Electronically Signed   By: Fidela Salisbury M.D.   On: 10/09/2017 11:02   US Pelvis Limited (transabdominal Only)  Result Date: 10/09/2017 CLINICAL DATA:  Evaluation of urinary bladder. EXAM: LIMITED ULTRASOUND OF PELVIS TECHNIQUE: Limited transabdominal ultrasound examination of the pelvis was performed. COMPARISON:  None. FINDINGS: The urinary bladder is nearly collapsed and therefore poorly evaluated. IMPRESSION: Suboptimal visualization of the urinary bladder due to its collapsed state. No gross abnormalities. Electronically Signed   By: Fidela Salisbury M.D.   On: 10/09/2017 11:04   Ct Abdomen Pelvis W Contrast  Result Date: 10/10/2017 CLINICAL DATA:  The patient states he has not been feeling well for over a month. Abdominal pain with loss of appetite. EXAM: CT ABDOMEN AND PELVIS WITH CONTRAST TECHNIQUE: Multidetector CT imaging of the abdomen and pelvis was performed using the standard protocol following bolus administration of intravenous contrast. CONTRAST:  145mL OMNIPAQUE IOHEXOL 300 MG/ML  SOLN COMPARISON:  Abdominal ultrasound from 10/09/2017 FINDINGS: Lower chest: The included heart is normal in size without pericardial effusion. No active pulmonary disease. Hepatobiliary: No focal liver abnormality is seen. No gallstones,  gallbladder wall thickening, or biliary dilatation. Probable minimal biliary sludge within. The gallbladder is nondistended in appearance. Pancreas: Unremarkable. No pancreatic ductal dilatation or surrounding inflammatory changes. Spleen: Normal in size without focal abnormality. Adrenals/Urinary Tract: Adrenal glands are unremarkable. Kidneys are normal, without renal calculi, focal lesion, or hydronephrosis. Bladder is unremarkable. Stomach/Bowel: Stomach is within normal limits. Appendix appears normal. No evidence of bowel wall thickening, distention, or inflammatory changes. Vascular/Lymphatic: Moderate aortoiliac atherosclerosis. Patent branch vessels. No aneurysm. No lymphadenopathy. Reproductive: Normal size prostate with central zone calcifications. Other: No ascites or free air. Musculoskeletal: Marked degenerative disc flattening and vacuum disc phenomena from L4 through S1. No pars defects or listhesis. Lower lumbar degenerative facet arthropathy is identified. Bones are demineralized in appearance. Right SI joint osteoarthritis with sclerosis. IMPRESSION: Lower lumbar degenerative disc and facet  arthropathy. No acute solid nor hollow visceral organ abnormality. Electronically Signed   By: Ashley Royalty M.D.   On: 10/10/2017 01:43     IMPRESSION:   *   Persistent nausea without abdominal pain.  Started at least 2 months ago intermittent issues associated with the spells of vertigo.  However nausea has been persistent for 3 weeks during which time the vertigo has resolved. Gallbladder sludge on ultrasound and CT.  Borderline thickening GB wall on ultrasound. HIDA scan planned today. Lipase minimally elevated, no pancreatitis or ductal abnormality per CT or ultrasound.  LFTs not elevated.    *   Macrocytic anemia.  Macrocytosis without anemia per labs in 2017.  B12 deficiency diagnosed last week and now started on IM B12 replacement.    *  Thrombocytopenia, non-critical.  No splenomegaly on  imaging.    *  2011 adenomatous colon polyp.  Has (overdue) colonoscopy set for 11/2017 with Dr Hilarie Fredrickson    PLAN:     *  Await HIDA.  If positive: lap chole, if negative: EGD tomorrow.   If EGD negative will need thorough endocrine and CNS workup.     Azucena Freed  10/10/2017, 11:12 AM Phone [phone]   Attending physician's note   I have taken a history, examined the patient and reviewed the chart. I agree with the Advanced Practitioner's note, impression and recommendations. 33 yr M with h/o Grave's disease s/p cervical fusion, vertigo with c/o nausea and dyspepsia.  Abdominal ultrasound with borderline gallbladder wall thickening. Undergoing HIDA scan to exclude cholecystitis.  If HIDA negative, will proceed with EGD to further evaluate The risks and benefits as well as alternatives of endoscopic procedure(s) have been discussed and reviewed. All questions answered. The patient agrees to proceed.  Damaris Hippo , MD [phone]

## 2017-10-10 NOTE — H&P (Signed)
History and Physical    Mario Smith QBH:[id number] DOB: 11-Feb-1941 DOA: 10/09/2017  **Will place patient on observation status based on the expectation that the patient will need hospitalization/ hospital care for less than or equal to 24 hours  PCP: Chipper Herb, MD   Attending physician: Evangeline Gula  Patient coming from/Resides with: Private residence  Chief Complaint: Abdominal pain with nausea and vomiting  HPI: Mario Smith is a 77 y.o. 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  Patient was diagnosed with vertigo in March and initially responded to meclizine but since that time has had intolerance to this medication.  Over the past 2 to 3 weeks patient has had persistent dizziness, nausea with diffuse nonfocal abdominal pain with nausea occurring as soon as the patient places food in his mouth.  He has not had any fevers or chills.  He has not had any blood in his stools.  At times his stools are yellow in appearance but nonmucoid.  Over the past 24 hours he has had multiple frequent loose stools that are not watery and nonbloody.  The ER he was afebrile, hemodynamically stable and not hypoxemic.  This revealed mild renal insufficiency with mildly elevated lipase of 65.  LFTs were within normal limits.  He had no leukocytosis.  CT of the abdomen and pelvis as well as abdominal ultrasound was completed that revealed no explanation for the patient's symptomatology.  The ultrasound did reveal borderline gallbladder wall thickening with a common bile duct measuring 6.3 mm.  General surgery was consulted and recommended hepatobiliary scan with ejection fraction to determine if patient's symptoms were related to chronic biliary dyskinesia.  In review of outpatient records patient has been found to have an extremely low B12 level of less than 150 and has been started on  weekly B12 injections.  He also has an outpatient appointment with GI/Dr. Hilarie Fredrickson scheduled for 5/13.  Weight has decreased from 174 lbs in March to 166 lbs as of May 1.  ED Course:  Vital Signs: BP 135/84 (BP Location: Left Arm)   Pulse 88   Temp 98.4 F (36.9 C) (Oral)   Resp 18   SpO2 100%  Abdominal ultrasound: Borderline gallbladder wall thickening with maximum transverse thickness 3.8 mm.  No pericholecystic fluid, moderate gallbladder sludge, no sonographic Murphy sign, common bile duct measures 6.3 mm. CT abdomen and pelvis: No abdominal findings to explain patient's current symptoms Lab data: Sodium 143, potassium 3.6, chloride 109, CO2 27, glucose 94, BUN 16, creatinine 1.41, lipase 65, LFTs normal, white count 4100 differential not obtained, hemoglobin 10.9, MCV 110, platelets 155,000, urinalysis abnormal with hazy appearance, small amount of bilirubin, ketones 5, protein 30, no bacteria, WBC 0-5 Medications and treatments: NS bolus x1 L  Review of Systems:  In addition to the HPI above,  No Fever-chills, myalgias or other constitutional symptoms No Headache, changes with Vision or hearing, new weakness, tingling, numbness in any extremity, dysarthria or word finding difficulty, gait disturbance or imbalance, tremors or seizure activity No problems swallowing food or Liquids, indigestion/reflux, choking or coughing while eating No Chest pain, Cough or Shortness of Breath, palpitations, orthopnea or DOE No melena,hematochezia, dark tarry stools, constipation No dysuria, malodorous urine, hematuria or flank pain No new skin rashes, lesions, masses or bruises, No new joint pains, aches, swelling or redness No recent unintentional weight gain or loss No polyuria, polydypsia or polyphagia  Past Medical History:  Diagnosis Date  . Back pain   . Colon polyp   . ED (erectile dysfunction)   . Essential hypertension, benign   . Graves disease   . HTN (hypertension)   . Other and  unspecified hyperlipidemia   . PONV (postoperative nausea and vomiting)   . Thrombocytopenia ([place])   . Vertigo     Past Surgical History:  Procedure Laterality Date  . CATARACT EXTRACTION    . COLONOSCOPY    . SPINAL FUSION  2001   cervical  . TENDON REPAIR Right 05/16/2014   Procedure: RIGHT RING FINGER FLEXOR TENDON REPAIR WITH PULL OUT BUTTON;  Surgeon: Roseanne Kaufman, MD;  Location: [facility];  Service: Orthopedics;  Laterality: Right;    Social History   Socioeconomic History  . Marital status: Married    Spouse name: Not on file  . Number of children: Not on file  . Years of education: Not on file  . Highest education level: Not on file  Occupational History  . Not on file  Social Needs  . Financial resource strain: Not on file  . Food insecurity:    Worry: Not on file    Inability: Not on file  . Transportation needs:    Medical: Not on file    Non-medical: Not on file  Tobacco Use  . Smoking status: Former Smoker    Types: Cigarettes    Last attempt to quit: 12/19/1992    Years since quitting: 24.8  . Smokeless tobacco: Never Used  Substance and Sexual Activity  . Alcohol use: No  . Drug use: No  . Sexual activity: Not Currently  Lifestyle  . Physical activity:    Days per week: Not on file    Minutes per session: Not on file  . Stress: Not on file  Relationships  . Social connections:    Talks on phone: Not on file    Gets together: Not on file    Attends religious service: Not on file    Active member of club or organization: Not on file    Attends meetings of clubs or organizations: Not on file    Relationship status: Not on file  . Intimate partner violence:    Fear of current or ex partner: Not on file    Emotionally abused: Not on file    Physically abused: Not on file    Forced sexual activity: Not on file  Other Topics Concern  . Not on file  Social History Narrative  . Not on file    Mobility: Independent prior to  onset of vertigo symptoms Work history: Not obtained   Allergies  Allergen Reactions  . Codeine   . Hydrocodone   . Meclizine Nausea Only  . Niacin And Related Other (See Comments)    unknown  . Niaspan [Niacin Er] Swelling  . Ondansetron Other (See Comments)    Tremors   . Phenergan [Promethazine Hcl] Other (See Comments)    Tremors   . Simvastatin Other (See Comments)    Leg cramps  . Sulfa Antibiotics     Family History  Problem Relation Age of Onset  . Heart disease Mother   . COPD Mother   . Heart disease Father   . COPD Father   . Alcohol abuse Father   . Cancer Sister        colon cancer  . Emphysema Sister   . Cancer Brother  prostate  . Emphysema Sister   . Prostate cancer Brother   . Healthy Daughter      Prior to Admission medications   Medication Sig Start Date End Date Taking? Authorizing Provider  aspirin 81 MG EC tablet Take 81 mg by mouth daily.     Yes [provider]  Cal Carb-Mag Hydrox-Simeth (ROLAIDS ADVANCED PO) Take 2-4 tablets by mouth as needed (indigestion).   Yes [provider]  carboxymethylcellulose (REFRESH PLUS) 0.5 % SOLN Place 1 drop into both eyes as needed (dry eyes).   Yes [provider]  Cholecalciferol (VITAMIN D3) 2000 UNITS TABS Take 1 tablet by mouth daily.     Yes [provider]  Coenzyme Q10 (CO Q 10 PO) Take 2 capsules by mouth daily.    Yes [provider]  fluticasone (FLONASE) 50 MCG/ACT nasal spray USE 2 SPRAYS IN EACH NOSTRIL ONCE DAILY 08/09/17  Yes Chipper Herb, MD  hydrochlorothiazide (MICROZIDE) 12.5 MG capsule TAKE 1 CAPSULE BY MOUTH ONCE DAILY 06/12/17  Yes Chipper Herb, MD  methimazole (TAPAZOLE) 5 MG tablet Take 10 mg by mouth 3 (three) times a week.  12/03/15  Yes [provider]  metoprolol tartrate (LOPRESSOR) 25 MG tablet Take 1 tablet (25 mg total) by mouth 2 (two) times daily. 12/14/16  Yes Timmothy Euler, MD  Omega-3 Fatty Acids (FISH  OIL) 1000 MG CAPS Take 1 capsule by mouth 2 (two) times daily.     Yes [provider]  omeprazole (PRILOSEC) 40 MG capsule Take 1 capsule (40 mg total) by mouth daily. 09/08/17  Yes Timmothy Euler, MD  Selenium 200 MCG TABS Take 1 tablet by mouth daily.    Yes [provider]  budesonide-formoterol (SYMBICORT) 80-4.5 MCG/ACT inhaler Inhale 2 puffs into the lungs 2 (two) times daily. 05/03/17 06/02/17  Timmothy Euler, MD  meclizine (ANTIVERT) 25 MG tablet TAKE 1 TABLET (25 MG TOTAL) BY MOUTH 3 (THREE) TIMES DAILY AS NEEDED FOR DIZZINESS. Patient not taking: Reported on 10/04/2017 09/28/17   Chipper Herb, MD  mupirocin ointment (BACTROBAN) 2 % Place 1 application into the nose 2 (two) times daily. Patient not taking: Reported on 10/09/2017 07/28/17   Chipper Herb, MD  promethazine (PHENERGAN) 25 MG tablet Take 1 tablet (25 mg total) by mouth every 8 (eight) hours as needed for nausea or vomiting. Patient not taking: Reported on 10/09/2017 10/02/17   Terald Sleeper, PA-C  telmisartan (MICARDIS) 80 MG tablet Take 1 tablet (80 mg total) by mouth daily. 08/21/17   Chevis Pretty, FNP    Physical Exam: Vitals:   10/10/17 0500 10/10/17 0600 10/10/17 0700 10/10/17 0817  BP: 104/65 133/67 124/72 135/84  Pulse: 76 73 73 88  Resp: 14 14 12 18   Temp:    98.4 F (36.9 C)  TempSrc:    Oral  SpO2: 97% 100% 99% 100%      Constitutional: NAD, calm, comfortable Eyes: PERRL, lids and conjunctivae normal ENMT: Mucous membranes are dry. Posterior pharynx clear of any exudate or lesions.Normal dentition.  Neck: normal, supple, no masses, no thyromegaly Respiratory: clear to auscultation bilaterally, no wheezing, no crackles. Normal respiratory effort. No accessory muscle use.  Cardiovascular: Regular rate and rhythm, no murmurs / rubs / gallops. No extremity edema. 2+ pedal pulses. No carotid bruits.  Abdomen: no tenderness, no masses palpated. No hepatosplenomegaly. Bowel  sounds positive.  Musculoskeletal: no clubbing / cyanosis. No joint deformity upper and lower extremities. Good  ROM, no contractures. Normal muscle tone.  Skin: no rashes, lesions, ulcers. No induration Neurologic: CN 2-12 grossly intact. Sensation intact, DTR normal. Strength 5/5 x all 4 extremities.  Psychiatric: Normal judgment and insight. Alert and oriented x 3. Normal mood.    Labs on Admission: I have personally reviewed following labs and imaging studies  CBC: Recent Labs  Lab 10/04/17 1217 10/09/17 1254  WBC 3.4 4.1  NEUTROABS 1.6  --   HGB 11.3* 10.9*  HCT 31.6* 31.2*  MCV 107* 110.2*  PLT 126* 258   Basic Metabolic Panel: Recent Labs  Lab 10/04/17 1217 10/09/17 1254  NA 141 143  K 4.3 3.6  CL 102 109  CO2 23 27  GLUCOSE 94 94  BUN 16 16  CREATININE 1.33* 1.41*  CALCIUM 9.1 9.3   GFR: Estimated Creatinine Clearance: 41.8 mL/min (A) (by C-G formula based on SCr of 1.41 mg/dL (H)). Liver Function Tests: Recent Labs  Lab 10/09/17 1254  AST 16  ALT 11*  ALKPHOS 50  BILITOT 0.5  PROT 6.5  ALBUMIN 3.8   Recent Labs  Lab 10/09/17 1254  LIPASE 65*   No results for input(s): AMMONIA in the last 168 hours. Coagulation Profile: No results for input(s): INR, PROTIME in the last 168 hours. Cardiac Enzymes: No results for input(s): CKTOTAL, CKMB, CKMBINDEX, TROPONINI in the last 168 hours. BNP (last 3 results) No results for input(s): PROBNP in the last 8760 hours. HbA1C: No results for input(s): HGBA1C in the last 72 hours. CBG: No results for input(s): GLUCAP in the last 168 hours. Lipid Profile: No results for input(s): CHOL, HDL, LDLCALC, TRIG, CHOLHDL, LDLDIRECT in the last 72 hours. Thyroid Function Tests: No results for input(s): TSH, T4TOTAL, FREET4, T3FREE, THYROIDAB in the last 72 hours. Anemia Panel: No results for input(s): VITAMINB12, FOLATE, FERRITIN, TIBC, IRON, RETICCTPCT in the last 72 hours. Urine analysis:    Component Value  Date/Time   COLORURINE YELLOW 10/09/2017 1922   APPEARANCEUR HAZY (A) 10/09/2017 1922   APPEARANCEUR Clear 07/28/2017 0940   LABSPEC 1.029 10/09/2017 1922   PHURINE 5.0 10/09/2017 [place] 10/09/2017 1922   HGBUR NEGATIVE 10/09/2017 1922   BILIRUBINUR SMALL (A) 10/09/2017 1922   BILIRUBINUR Negative 07/28/2017 0940   KETONESUR 5 (A) 10/09/2017 1922   PROTEINUR 30 (A) 10/09/2017 1922   UROBILINOGEN negative 12/29/2014 1642   NITRITE NEGATIVE 10/09/2017 1922   LEUKOCYTESUR NEGATIVE 10/09/2017 1922   LEUKOCYTESUR Negative 07/28/2017 0940   Sepsis Labs: @LABRCNTIP (procalcitonin:4,lacticidven:4) )No results found for this or any previous visit (from the past 240 hour(s)).   Radiological Exams on Admission: US Abdomen Complete  Result Date: 10/09/2017 CLINICAL DATA:  Nausea and diarrhea for 3 months.  Weight loss. EXAM: ABDOMEN ULTRASOUND COMPLETE COMPARISON:  None. FINDINGS: Gallbladder: Borderline gallbladder wall thickening with maximum transverse thickness of 3.8 mm. No pericholecystic fluid. Moderate amount of gallbladder sludge. No sonographic Murphy sign noted by sonographer. Common bile duct: Diameter: 6.3 mm Liver: No focal lesion identified. Within normal limits in parenchymal echogenicity. Two calcified granulomata noted. Portal vein is patent on color Doppler imaging with normal direction of blood flow towards the liver. IVC: No abnormality visualized. Pancreas: Visualized portion unremarkable. Spleen: Size and appearance within normal limits. Right Kidney: Length: 9.9 cm. Echogenicity within normal limits. No mass or hydronephrosis visualized. Left Kidney: Length: 10.5 cm. Echogenicity within normal limits. No mass or hydronephrosis visualized. Abdominal aorta: No aneurysm visualized. Other findings: None. IMPRESSION: Gallbladder sludge with borderline gallbladder wall  thickening, which in the appropriate clinical setting is suggestive of acute cholecystitis. Otherwise  no acute abnormalities within the abdomen. These results will be called to the ordering clinician or representative by the Radiologist Assistant, and communication documented in the PACS or zVision Dashboard. Electronically Signed   By: Fidela Salisbury M.D.   On: 10/09/2017 11:02   US Pelvis Limited (transabdominal Only)  Result Date: 10/09/2017 CLINICAL DATA:  Evaluation of urinary bladder. EXAM: LIMITED ULTRASOUND OF PELVIS TECHNIQUE: Limited transabdominal ultrasound examination of the pelvis was performed. COMPARISON:  None. FINDINGS: The urinary bladder is nearly collapsed and therefore poorly evaluated. IMPRESSION: Suboptimal visualization of the urinary bladder due to its collapsed state. No gross abnormalities. Electronically Signed   By: Fidela Salisbury M.D.   On: 10/09/2017 11:04   Ct Abdomen Pelvis W Contrast  Result Date: 10/10/2017 CLINICAL DATA:  The patient states he has not been feeling well for over a month. Abdominal pain with loss of appetite. EXAM: CT ABDOMEN AND PELVIS WITH CONTRAST TECHNIQUE: Multidetector CT imaging of the abdomen and pelvis was performed using the standard protocol following bolus administration of intravenous contrast. CONTRAST:  144mL OMNIPAQUE IOHEXOL 300 MG/ML  SOLN COMPARISON:  Abdominal ultrasound from 10/09/2017 FINDINGS: Lower chest: The included heart is normal in size without pericardial effusion. No active pulmonary disease. Hepatobiliary: No focal liver abnormality is seen. No gallstones, gallbladder wall thickening, or biliary dilatation. Probable minimal biliary sludge within. The gallbladder is nondistended in appearance. Pancreas: Unremarkable. No pancreatic ductal dilatation or surrounding inflammatory changes. Spleen: Normal in size without focal abnormality. Adrenals/Urinary Tract: Adrenal glands are unremarkable. Kidneys are normal, without renal calculi, focal lesion, or hydronephrosis. Bladder is unremarkable. Stomach/Bowel: Stomach is  within normal limits. Appendix appears normal. No evidence of bowel wall thickening, distention, or inflammatory changes. Vascular/Lymphatic: Moderate aortoiliac atherosclerosis. Patent branch vessels. No aneurysm. No lymphadenopathy. Reproductive: Normal size prostate with central zone calcifications. Other: No ascites or free air. Musculoskeletal: Marked degenerative disc flattening and vacuum disc phenomena from L4 through S1. No pars defects or listhesis. Lower lumbar degenerative facet arthropathy is identified. Bones are demineralized in appearance. Right SI joint osteoarthritis with sclerosis. 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 No acute solid nor hollow visceral organ abnormality. Electronically Signed   By: Ashley Royalty M.D.   On: 10/10/2017 01:43    Assessment/Plan Principal Problem:   Dehydration 2/2 recurrent nausea, recent onset diarrhea with unexplained Abdominal pain  -Patient has had progressive decline after development of vertigo symptoms in March with subsequent development of chronic nausea and diffuse nonfocal abdominal pain with recent development of diarrhea without fever with concerns postulated over possible biliary dyskinesia -No recent antibiotics so doubt C. difficile colitis -Patient reports intermittent episodes of yellow nonmucoid stools -Hepatobiliary scan with ejection fraction pending per surgery recommendation -Possibly related to undetected ulcer disease therefore check H. pylori serologies -Discussed with GI who will see the patient in consultation postulated possibly related to intermittent mesenteric ischemia-patient has mild AKI with underlying stage III chronic kidney disease so unclear if can pursue contrast related imaging-serum lactate normal -IV fluid hydration and NPO until hepatobiliary scan completed -Unable to use narcotic pain medications until hepatobiliary scan completed and given CKD with mild AKI unable to utilize IV  NSAIDs -IV Pepcid every 12 hours -Obtain magnesium and phosphorus  Active Problems:   HTN (hypertension) -Blood pressure currently controlled  -Mild renal insufficiency we will hold preadmission telmisartan -Dehydration so hold thiazide diuretic -Once diet resumed plan to  restart metoprolol    HLD (hyperlipidemia) -Once diet advanced resume omega-3 fatty acids    CKD 3 -Baseline renal function 15/1.20 -Current renal function 16/1.41    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 -Once hepatobiliary scan completed resume Tapazole -Recent TSH normal at 3.66, T4 normal 9.2, 3 thyroxine index normal at 2.7, and T3 uptake ratio 29    Vertigo -Initial symptoms began in March and responded to meclizine but family states currently not responsive -Continues with intermittent symptoms so will require assistance with mobility -Consider PT evaluation for BPV  maneuvers    B12 deficiency anemia -Recent B12 level very low (<150) PCP has initiated weekly B12 injections -Given recent GI symptoms consider malabsorption disorder    **Additional lab, imaging and/or diagnostic evaluation at discretion of supervising physician  DVT prophylaxis: Lovenox Code Status: DO NOT RESUSCITATE Family Communication: Family at bedside Disposition Plan: Home Consults called: Surgery/[facility]; GI/[facility] (established patient)    Samella Parr ANP-BC [facility] Pager [phone]   If 7PM-7AM, please contact night-coverage [email or web address] Password [facility]  10/10/2017, 9:41 AM

## 2017-10-10 NOTE — ED Notes (Signed)
General Surgery MD at bedside.

## 2017-10-10 NOTE — Consult Note (Addendum)
[facility] Consult: 11:12 AM 10/10/2017  LOS: 0 days    Referring Provider: Dr Evangeline Gula  Primary Care Physician:  Chipper Herb, MD Primary Gastroenterologist:  Dr Deatra Ina >> [place].       Reason for Consultation: Nausea.   HPI: Mario Smith is a 77 y.o. male.  PMH Graves dz, followed at [facility].  Htn.  Deg spine dz, s/p cervical fusion.  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 S/p 09/25/17 bil ophthalmology surgery to "fix the muscles behind his eye" (right superior rectus recession, left inferior rectus recession).  Multiple medication allergies.   Macrocytosis, without anemia, dates to 01/2016.   03/2010 Colonscopy.  Higher risk screening study secondary to sister's hx of colon CA.   5 mm sessile polyp (TA without HGD) descending colon.   Has not undergone repeat surveillance colonoscopy but he has an upcoming appointment on 11/22/17 for colonoscopy with Dr Hilarie Fredrickson.  Appointment was set a few months ago, before onset of current troubles.  Starting about 2 months ago, patient developed intermittent, but frequent episodes, of vertigo.  These were associated with nausea.  During severe bouts of vertigo he vomited but not often.  Never coffee-ground or bloody emesis.   During this timeframe he also had intermittent spells of liquid, brown stools interspersed around formed stools.  No abdominal pain. Chronically compliant with  omeprazole 40 mg a day.  Meclizine helped the dizziness, but eventually, his wife tells me, this began to cause jerkiness/tremors..  Ondansetron and promethazine both caused jittery, jerking of his hands and tremors so he was not able to take these. Patient previously used ibuprofen at most a few times a month but none in several weeks.  No aspirin.  No dysphagia.  6 #  weight loss in the last 10 days.  Feels very weak and lacking in energy but no gait or balance disturbance.  Patient has only recently been evaluated for this by PMD.  He has not spoken with his endocrinologist about these issues. Within the past 10 - 14 days the vertigo has abated but for the last 3 weeks the nausea has been nonstop.  He does not vomit much.  His diet mostly consists of Gatorade and crackers. Seen by Dr. Laurance Flatten last week.  Office visit with GI Pa-C was arranged for 5/13.  On Friday he received the first injection of vitamin B12 because labs showed B12 deficiency.  Plan is for him to have weekly B12 injections for 4 weeks and monthly thereafter.  However he is gotten so weak some nauseous that his family brought him to the emergency room yesterday.  CT head 03/2017.  For evaluation of headache and sinusitis.  No acute intracranial abnormality.  Atrophy, chronic microvascular disease. Abd ultrasound 10/09/17: GB sludge, borderline GB thickening.  6.3 mm CBD.  CT abdomen/pelvis with contrast 10/10/17.  Minor GB sludge.   Moderate aortoiliac atherosclerosis with patent branch vessels. No acute solid nor hollow visceral organ abnormality.  Lumbar DDD/DJD.   HIDA scan ordered by Dr Kae Heller from gen surg.  Lipase slightly elevated at 65 (RR 11 - 51).  LFTS at or below normal.   Hgb 10.9 with MCV 110.  Platelets 126 - 155  No family history of gallbladder disease.  Patient does not drink alcohol.  He quit smoking 30 to 40 years ago.  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   Past Medical History:  Diagnosis Date  . Back pain   . Colon polyp   . ED (erectile dysfunction)   . Essential hypertension, benign   . Gallbladder sludge 10/2017  . Graves disease   . HTN (hypertension)   . Other and unspecified hyperlipidemia   . PONV (postoperative nausea and vomiting)   . Thrombocytopenia ([place])   . Vertigo     Past  Surgical History:  Procedure Laterality Date  . CATARACT EXTRACTION    . COLONOSCOPY  03/2010    Dr Thomasenia Sales, [facility]. Higher risk screening study, sister's hx of colon CA.    Marland Kitchen SPINAL FUSION  2001   cervical  . TENDON REPAIR Right 05/16/2014   Procedure: RIGHT RING FINGER FLEXOR TENDON REPAIR WITH PULL OUT BUTTON;  Surgeon: Roseanne Kaufman, MD;  Location: [facility];  Service: Orthopedics;  Laterality: Right;    Prior to Admission medications   Medication Sig Start Date End Date Taking? Authorizing Provider  aspirin 81 MG EC tablet Take 81 mg by mouth daily.     Yes [provider]  Cal Carb-Mag Hydrox-Simeth (ROLAIDS ADVANCED PO) Take 2-4 tablets by mouth as needed (indigestion).   Yes [provider]  carboxymethylcellulose (REFRESH PLUS) 0.5 % SOLN Place 1 drop into both eyes as needed (dry eyes).   Yes [provider]  Cholecalciferol (VITAMIN D3) 2000 UNITS TABS Take 1 tablet by mouth daily.     Yes [provider]  Coenzyme Q10 (CO Q 10 PO) Take 2 capsules by mouth daily.    Yes [provider]  fluticasone (FLONASE) 50 MCG/ACT nasal spray USE 2 SPRAYS IN EACH NOSTRIL ONCE DAILY 08/09/17  Yes Chipper Herb, MD  hydrochlorothiazide (MICROZIDE) 12.5 MG capsule TAKE 1 CAPSULE BY MOUTH ONCE DAILY 06/12/17  Yes Chipper Herb, MD  methimazole (TAPAZOLE) 5 MG tablet Take 10 mg by mouth 3 (three) times a week.  12/03/15  Yes [provider]  metoprolol tartrate (LOPRESSOR) 25 MG tablet Take 1 tablet (25 mg total) by mouth 2 (two) times daily. 12/14/16  Yes Timmothy Euler, MD  Omega-3 Fatty Acids (FISH OIL) 1000 MG CAPS Take 1 capsule by mouth 2 (two) times daily.     Yes [provider]  omeprazole (PRILOSEC) 40 MG capsule Take 1 capsule (40 mg total) by mouth daily. 09/08/17  Yes Timmothy Euler, MD  Selenium 200 MCG TABS Take 1 tablet by mouth daily.    Yes [provider]  budesonide-formoterol  (SYMBICORT) 80-4.5 MCG/ACT inhaler Inhale 2 puffs into the lungs 2 (two) times daily. 05/03/17 06/02/17  Timmothy Euler, MD  meclizine (ANTIVERT) 25 MG tablet TAKE 1 TABLET (25 MG TOTAL) BY MOUTH 3 (THREE) TIMES DAILY AS NEEDED FOR DIZZINESS. Patient not taking: Reported on 10/04/2017 09/28/17   Chipper Herb, MD  mupirocin ointment (BACTROBAN) 2 % Place 1 application into the nose 2 (two) times daily. Patient not taking: Reported on 10/09/2017 07/28/17   Chipper Herb, MD  promethazine (PHENERGAN) 25 MG tablet Take 1  tablet (25 mg total) by mouth every 8 (eight) hours as needed for nausea or vomiting. Patient not taking: Reported on 10/09/2017 10/02/17   Terald Sleeper, PA-C  telmisartan (MICARDIS) 80 MG tablet Take 1 tablet (80 mg total) by mouth daily. 08/21/17   Hassell Done Mary-Margaret, FNP    Scheduled Meds: . enoxaparin (LOVENOX) injection  40 mg Subcutaneous Q24H  . fluticasone  2 spray Each Nare Daily  . folic acid  1 mg Intravenous Daily  . [START ON 10/11/2017] methimazole  10 mg Oral Once per day on Mon Wed Fri  . mometasone-formoterol  2 puff Inhalation BID  . sodium chloride flush  3 mL Intravenous Q12H  . thiamine injection  100 mg Intravenous Daily   Infusions: . sodium chloride 100 mL/hr at 10/10/17 1012  . famotidine (PEPCID) IV 20 mg (10/10/17 1007)   PRN Meds: acetaminophen **OR** acetaminophen, morphine injection, ondansetron (ZOFRAN) IV **OR** promethazine, polyvinyl alcohol   Allergies as of 10/09/2017 - Review Complete 10/09/2017  Allergen Reaction Noted  . Codeine    . Hydrocodone    . Meclizine Nausea Only 10/09/2017  . Niacin and related Other (See Comments) 10/09/2017  . Niaspan [niacin er] Swelling 09/09/2010  . Ondansetron Other (See Comments) 10/09/2017  . Phenergan [promethazine hcl] Other (See Comments) 10/09/2017  . Simvastatin Other (See Comments) 09/09/2010  . Sulfa antibiotics  04/22/2014    Family History  Problem Relation Age of Onset  . Heart  disease Mother   . COPD Mother   . Heart disease Father   . COPD Father   . Alcohol abuse Father   . Cancer Sister        colon cancer  . Emphysema Sister   . Cancer Brother        prostate  . Emphysema Sister   . Prostate cancer Brother   . Healthy Daughter     Social History   Socioeconomic History  . Marital status: Married    Spouse name: Not on file  . Number of children: Not on file  . Years of education: Not on file  . Highest education level: Not on file  Occupational History  . Not on file  Social Needs  . Financial resource strain: Not on file  . Food insecurity:    Worry: Not on file    Inability: Not on file  . Transportation needs:    Medical: Not on file    Non-medical: Not on file  Tobacco Use  . Smoking status: Former Smoker    Types: Cigarettes    Last attempt to quit: 12/19/1992    Years since quitting: 24.8  . Smokeless tobacco: Never Used  Substance and Sexual Activity  . Alcohol use: No  . Drug use: No  . Sexual activity: Not Currently  Lifestyle  . Physical activity:    Days per week: Not on file    Minutes per session: Not on file  . Stress: Not on file  Relationships  . Social connections:    Talks on phone: Not on file    Gets together: Not on file    Attends religious service: Not on file    Active member of club or organization: Not on file    Attends meetings of clubs or organizations: Not on file    Relationship status: Not on file  . Intimate partner violence:    Fear of current or ex partner: Not on file    Emotionally abused: Not on file  Physically abused: Not on file    Forced sexual activity: Not on file  Other Topics Concern  . Not on file  Social History Narrative  . Not on file    REVIEW OF SYSTEMS: Constitutional: Tired and weak. ENT:  No nose bleeds Pulm: No difficulty breathing.  No cough. CV:  No palpitations, no LE edema.  GU:  No hematuria, no frequency GI:  Per HPI Heme: No excessive or unusual  bleeding or bruising.  Previous issues with anemia.  Recently started on B12 shots as per HPI.   Transfusions: No previous transfusions. Neuro:  No headaches, no peripheral tingling or numbness Derm:  No itching, no rash or sores.  Endocrine:  No sweats or chills.  No polyuria or dysuria Immunization: Reviewed.  He is up-to-date on multiple vaccinations including but not limited to flu and Pneumovax. Travel:  None beyond local counties in last few months.    PHYSICAL EXAM: Vital signs in last 24 hours: Vitals:   10/10/17 1000 10/10/17 1100  BP: (!) 124/58 (!) 118/91  Pulse: 69 86  Resp: 17 16  Temp:    SpO2: (!) 86% 90%   Wt Readings from Last 3 Encounters:  10/04/17 166 lb (75.3 kg)  09/08/17 172 lb 9.6 oz (78.3 kg)  08/17/17 174 lb (78.9 kg)    General: Patient looks mildly ill but comfortable.  Wearing glasses.  The right blast lens is frosted.  Left exophthalmos. Head: No facial asymmetry or swelling.  No signs of head trauma. Eyes: Icterus.  No conjunctival pallor.  EOMI. Ears:  HOH  Nose: No congestion or discharge. Mouth: Moist, pink, clear oral MM.  Midline.  Good dentition. Neck: No JVD, no thyromegaly, no masses. Lungs: Clear bilaterally.  No cough.  No labored breathing. Heart: RRR.  No MRG.  S1, S2 present. Abdomen: Soft.  Not tender or distended.  Active bowel sounds.  No masses, bruits, hernias, organomegaly..   Rectal: Deferred Musc/Skeltl: No joint redness or swelling. Extremities: No CCE. Neurologic:  HOH.  Resting tremor of the right hand which worsens with intention.  Tremor in the left hand.  No limb weakness.  Moves all 4 limbs.  Lower extremity tremor.  Oriented x3. Skin: No rashes, no sores, no bronzing, no telangiectasia. Tattoos: None observed. Nodes: No cervical adenopathy. Psych: Cooperative, calm, pleasant.  Intake/Output from previous day: 05/06 0701 - 05/07 0700 In: 500 [IV Piggyback:500] Out: -  Intake/Output this shift: No intake/output  data recorded.  LAB RESULTS: Recent Labs    10/09/17 1254  WBC 4.1  HGB 10.9*  HCT 31.2*  PLT 155   BMET Lab Results  Component Value Date   NA 143 10/09/2017   NA 141 10/04/2017   NA 143 07/25/2017   K 3.6 10/09/2017   K 4.3 10/04/2017   K 4.4 07/25/2017   CL 109 10/09/2017   CL 102 10/04/2017   CL 104 07/25/2017   CO2 27 10/09/2017   CO2 23 10/04/2017   CO2 27 07/25/2017   GLUCOSE 94 10/09/2017   GLUCOSE 94 10/04/2017   GLUCOSE 94 07/25/2017   BUN 16 10/09/2017   BUN 16 10/04/2017   BUN 15 07/25/2017   CREATININE 1.41 (H) 10/09/2017   CREATININE 1.33 (H) 10/04/2017   CREATININE 1.20 07/25/2017   CALCIUM 9.3 10/09/2017   CALCIUM 9.1 10/04/2017   CALCIUM 9.5 07/25/2017   LFT Recent Labs    10/09/17 1254  PROT 6.5  ALBUMIN 3.8  AST 16  ALT  11*  ALKPHOS 50  BILITOT 0.5   PT/INR No results found for: INR, PROTIME Hepatitis Panel No results for input(s): HEPBSAG, HCVAB, HEPAIGM, HEPBIGM in the last 72 hours. C-Diff No components found for: CDIFF Lipase     Component Value Date/Time   LIPASE 65 (H) 10/09/2017 1254    Drugs of Abuse  No results found for: LABOPIA, COCAINSCRNUR, LABBENZ, AMPHETMU, THCU, LABBARB   RADIOLOGY STUDIES: US Abdomen Complete  Result Date: 10/09/2017 CLINICAL DATA:  Nausea and diarrhea for 3 months.  Weight loss. EXAM: ABDOMEN ULTRASOUND COMPLETE COMPARISON:  None. FINDINGS: Gallbladder: Borderline gallbladder wall thickening with maximum transverse thickness of 3.8 mm. No pericholecystic fluid. Moderate amount of gallbladder sludge. No sonographic Murphy sign noted by sonographer. Common bile duct: Diameter: 6.3 mm Liver: No focal lesion identified. Within normal limits in parenchymal echogenicity. Two calcified granulomata noted. Portal vein is patent on color Doppler imaging with normal direction of blood flow towards the liver. IVC: No abnormality visualized. Pancreas: Visualized portion unremarkable. Spleen: Size and  appearance within normal limits. Right Kidney: Length: 9.9 cm. Echogenicity within normal limits. No mass or hydronephrosis visualized. Left Kidney: Length: 10.5 cm. Echogenicity within normal limits. No mass or hydronephrosis visualized. Abdominal aorta: No aneurysm visualized. Other findings: None. IMPRESSION: Gallbladder sludge with borderline gallbladder wall thickening, which in the appropriate clinical setting is suggestive of acute cholecystitis. Otherwise no acute abnormalities within the abdomen. These results will be called to the ordering clinician or representative by the Radiologist Assistant, and communication documented in the PACS or zVision Dashboard. Electronically Signed   By: Fidela Salisbury M.D.   On: 10/09/2017 11:02   US Pelvis Limited (transabdominal Only)  Result Date: 10/09/2017 CLINICAL DATA:  Evaluation of urinary bladder. EXAM: LIMITED ULTRASOUND OF PELVIS TECHNIQUE: Limited transabdominal ultrasound examination of the pelvis was performed. COMPARISON:  None. FINDINGS: The urinary bladder is nearly collapsed and therefore poorly evaluated. IMPRESSION: Suboptimal visualization of the urinary bladder due to its collapsed state. No gross abnormalities. Electronically Signed   By: Fidela Salisbury M.D.   On: 10/09/2017 11:04   Ct Abdomen Pelvis W Contrast  Result Date: 10/10/2017 CLINICAL DATA:  The patient states he has not been feeling well for over a month. Abdominal pain with loss of appetite. EXAM: CT ABDOMEN AND PELVIS WITH CONTRAST TECHNIQUE: Multidetector CT imaging of the abdomen and pelvis was performed using the standard protocol following bolus administration of intravenous contrast. CONTRAST:  155mL OMNIPAQUE IOHEXOL 300 MG/ML  SOLN COMPARISON:  Abdominal ultrasound from 10/09/2017 FINDINGS: Lower chest: The included heart is normal in size without pericardial effusion. No active pulmonary disease. Hepatobiliary: No focal liver abnormality is seen. No gallstones,  gallbladder wall thickening, or biliary dilatation. Probable minimal biliary sludge within. The gallbladder is nondistended in appearance. Pancreas: Unremarkable. No pancreatic ductal dilatation or surrounding inflammatory changes. Spleen: Normal in size without focal abnormality. Adrenals/Urinary Tract: Adrenal glands are unremarkable. Kidneys are normal, without renal calculi, focal lesion, or hydronephrosis. Bladder is unremarkable. Stomach/Bowel: Stomach is within normal limits. Appendix appears normal. No evidence of bowel wall thickening, distention, or inflammatory changes. Vascular/Lymphatic: Moderate aortoiliac atherosclerosis. Patent branch vessels. No aneurysm. No lymphadenopathy. Reproductive: Normal size prostate with central zone calcifications. Other: No ascites or free air. Musculoskeletal: Marked degenerative disc flattening and vacuum disc phenomena from L4 through S1. No pars defects or listhesis. Lower lumbar degenerative facet arthropathy is identified. Bones are demineralized in appearance. Right SI joint osteoarthritis with sclerosis. IMPRESSION: Lower lumbar degenerative disc and facet  arthropathy. No acute solid nor hollow visceral organ abnormality. Electronically Signed   By: Ashley Royalty M.D.   On: 10/10/2017 01:43     IMPRESSION:   *   Persistent nausea without abdominal pain.  Started at least 2 months ago intermittent issues associated with the spells of vertigo.  However nausea has been persistent for 3 weeks during which time the vertigo has resolved. Gallbladder sludge on ultrasound and CT.  Borderline thickening GB wall on ultrasound. HIDA scan planned today. Lipase minimally elevated, no pancreatitis or ductal abnormality per CT or ultrasound.  LFTs not elevated.    *   Macrocytic anemia.  Macrocytosis without anemia per labs in 2017.  B12 deficiency diagnosed last week and now started on IM B12 replacement.    *  Thrombocytopenia, non-critical.  No splenomegaly on  imaging.    *  2011 adenomatous colon polyp.  Has (overdue) colonoscopy set for 11/2017 with Dr Hilarie Fredrickson    PLAN:     *  Await HIDA.  If positive: lap chole, if negative: EGD tomorrow.   If EGD negative will need thorough endocrine and CNS workup.     Azucena Freed  10/10/2017, 11:12 AM Phone [phone]   Attending physician's note   I have taken a history, examined the patient and reviewed the chart. I agree with the Advanced Practitioner's note, impression and recommendations. 55 yr M with h/o Grave's disease s/p cervical fusion, vertigo with c/o nausea and dyspepsia.  Abdominal ultrasound with borderline gallbladder wall thickening. Undergoing HIDA scan to exclude cholecystitis.  If HIDA negative, will proceed with EGD to further evaluate The risks and benefits as well as alternatives of endoscopic procedure(s) have been discussed and reviewed. All questions answered. The patient agrees to proceed.  Damaris Hippo , MD [phone]

## 2017-10-10 NOTE — Progress Notes (Signed)
Patient ID: Mario Smith, male   DOB: 05-21-1941, 77 y.o.   MRN: [id number]   Acute Care Surgery Service Progress Note:    Chief Complaint/Subjective: History per Dr. Marjory Sneddon note.  Multiple week history of vertigo which is improving but has fairly constant nausea and intermittent emesis as well as epigastric and upper abdominal burning.  Has had anorexia.  No frequent NSAID use.  Objective: Vital signs in last 24 hours: Temp:  [98 F (36.7 C)-98.4 F (36.9 C)] 98.4 F (36.9 C) (05/07 0817) Pulse Rate:  [69-93] 85 (05/07 1511) Resp:  [12-19] 13 (05/07 1511) BP: (93-135)/(48-97) 96/71 (05/07 1511) SpO2:  [86 %-100 %] 95 % (05/07 1511)    Intake/Output from previous day: 05/06 0701 - 05/07 0700 In: 500 [IV Piggyback:500] Out: -  Intake/Output this shift: Total I/O In: 50 [IV Piggyback:50] Out: -   Lungs: cta, nonlabored  Cardiovascular: reg  Abd: Soft, nondistended, nontender  Extremities: no edema, +SCDs  Neuro: alert, nonfocal; a little pale  Lab Results: CBC  Recent Labs    10/09/17 1254  WBC 4.1  HGB 10.9*  HCT 31.2*  PLT 155   BMET Recent Labs    10/09/17 1254  NA 143  K 3.6  CL 109  CO2 27  GLUCOSE 94  BUN 16  CREATININE 1.41*  CALCIUM 9.3   LFT Hepatic Function Latest Ref Rng & Units 10/09/2017 07/25/2017 02/23/2017  Total Protein 6.5 - 8.1 g/dL 6.5 6.9 6.6  Albumin 3.5 - 5.0 g/dL 3.8 4.4 4.4  AST 15 - 41 U/L '16 18 18  ' ALT 17 - 63 U/L 11(L) 10 10  Alk Phosphatase 38 - 126 U/L 50 63 60  Total Bilirubin 0.3 - 1.2 mg/dL 0.5 0.6 0.6  Bilirubin, Direct 0.00 - 0.40 mg/dL - 0.17 0.17   PT/INR No results for input(s): LABPROT, INR in the last 72 hours. ABG No results for input(s): PHART, HCO3 in the last 72 hours.  Invalid input(s): PCO2, PO2  Studies/Results:  Anti-infectives: Anti-infectives (From admission, onward)   None      Medications: Scheduled Meds: . enoxaparin (LOVENOX) injection  40 mg Subcutaneous Q24H  . fluticasone  2  spray Each Nare Daily  . folic acid  1 mg Intravenous Daily  . [START ON 10/11/2017] methimazole  10 mg Oral Once per day on Mon Wed Fri  . mometasone-formoterol  2 puff Inhalation BID  . sodium chloride flush  3 mL Intravenous Q12H  . thiamine injection  100 mg Intravenous Daily   Continuous Infusions: . sodium chloride 100 mL/hr at 10/10/17 1012  . famotidine (PEPCID) IV Stopped (10/10/17 1100)   PRN Meds:.acetaminophen **OR** acetaminophen, morphine injection, ondansetron (ZOFRAN) IV **OR** promethazine, polyvinyl alcohol  Assessment/Plan: Patient Active Problem List   Diagnosis Date Noted  . Abdominal pain 10/10/2017  . Dehydration 10/10/2017  . HTN (hypertension) 10/10/2017  . HLD (hyperlipidemia) 10/10/2017  . Vertigo 10/10/2017  . B12 deficiency anemia 10/10/2017  . 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 10/10/2017  . B12 deficiency 10/06/2017  . Graves disease 10/27/2016  . Elevated PSA 10/27/2016  . Thrombocytopenia ([place]) 06/25/2016  . Hyperthyroidism 02/24/2016  . Hyperlipidemia 05/27/2013  . Vitamin D deficiency 05/27/2013  . Hypertension 02/11/2010   Nausea, vomiting, anorexia Vertigo Mild acute kidney injury Gallbladder sludge Low gallbladder ejection fraction Graves' disease B12 deficiency  Long conversation with patient, wife, and daughter Some components of his history are consistent with biliary dyskinesia however having nausea to the extent requiring hospitalization is a  tad unusual for biliary colic Discussed differential diagnosis which could include biliary dyskinesia versus foregut abnormality like GERD or gastritis or peptic ulcer disease versus other etiology  Discussed pros and cons of cholecystectomy and upper endoscopy Discussed that cholecystectomy may not ameliorate his symptoms.  Also discussed that upper endoscopy may not reveal the diagnosis either.  I discussed laparoscopic cholecystectomy in detail.  The patient was shown  diagrams detailing the  procedure.  We discussed the risks and benefits of a laparoscopic cholecystectomy including, but not limited to bleeding, infection, injury to surrounding structures such as the intestine or liver, bile leak, retained gallstones, need to convert to an open procedure, prolonged diarrhea, blood clots such as  DVT, common bile duct injury, anesthesia risks, and possible need for additional procedures.  We discussed the typical post-operative recovery course. I explained that the likelihood of improvement of their symptoms is unknown at this time.  We discussed that if the has a negative upper endoscopy then cholecystectomy may higher likelihood of resolution of his symptoms however there is still a possibility that even with cholecystectomy and upper endoscopy he still could have symptoms  They would like to start with upper endoscopy.  If his upper endoscopy is normal then we will tentatively plan cholecystectomy for Thursday  Can have clear liquid diet tonight  Leighton Ruff. Redmond Pulling, MD, FACS General, Bariatric, & Minimally Invasive Surgery [facility], [place]   Disposition:  LOS: 0 days    Leighton Ruff. Redmond Pulling, MD, FACS General, Bariatric, & Minimally Invasive Surgery [phone] [facility]

## 2017-10-10 NOTE — Consult Note (Signed)
Surgical Consultation Requesting provider: Coral Ceo PA  CC: nausea  HPI: very nice 77-year-old man with a history of Graves' disease, hypertension, hyperlipidemia, who presented to the emergency room today to be evaluated for nausea, burning epigastric pain and diarrhea. He has had nausea and decreased appetite for the last 3 months-this began with an episode of vertigo and while the dizziness resolved, the nausea has never gone away. He does not vomit but is nausea severe enough that he is essentially anorexic and really nothing but crackers and Gatorade many days. He describes some burning pain in the epigastrium intermittently as well. His symptoms have worsened for the last 2 or 3 weeks. About 24 hours ago he developed loose stools as well, denies hematochezia or melena. Denies any known fevers.he has lost weight, about 6 pounds of the month of April.  He has had an ultrasound yesterday which showed gallbladder wall thickness of 3.8 mm, no secondary signs of cholecystitis,, there was sludge in the gallbladder. He underwent a CT scan today that does not show any acute abnormality. His labs are notable for acute kidney injury with a creatinine of 1.4 where his baseline seems to be 1.2, lipase mildly elevated at 65, but bilirubin and other LFTs are normal. No white count.  He has never had any abdominal surgeries. Allergies  Allergen Reactions  . Codeine   . Hydrocodone   . Meclizine Nausea Only  . Niacin And Related Other (See Comments)    unknown  . Niaspan [Niacin Er] Swelling  . Ondansetron Other (See Comments)    Tremors   . Phenergan [Promethazine Hcl] Other (See Comments)    Tremors   . Simvastatin Other (See Comments)    Leg cramps  . Sulfa Antibiotics     Past Medical History:  Diagnosis Date  . Back pain   . Colon polyp   . ED (erectile dysfunction)   . Essential hypertension, benign   . Graves disease   . Other and unspecified hyperlipidemia   . PONV  (postoperative nausea and vomiting)   . Thrombocytopenia ([place])     Past Surgical History:  Procedure Laterality Date  . CATARACT EXTRACTION    . COLONOSCOPY    . SPINAL FUSION  2001   cervical  . TENDON REPAIR Right 05/16/2014   Procedure: RIGHT RING FINGER FLEXOR TENDON REPAIR WITH PULL OUT BUTTON;  Surgeon: Roseanne Kaufman, MD;  Location: [employer] [facility];  Service: Orthopedics;  Laterality: Right;    Family History  Problem Relation Age of Onset  . Heart disease Mother   . COPD Mother   . Heart disease Father   . COPD Father   . Alcohol abuse Father   . Cancer Sister        colon cancer  . Emphysema Sister   . Cancer Brother        prostate  . Emphysema Sister   . Prostate cancer Brother   . Healthy Daughter     Social History   Socioeconomic History  . Marital status: Married    Spouse name: Not on file  . Number of children: Not on file  . Years of education: Not on file  . Highest education level: Not on file  Occupational History  . Not on file  Social Needs  . Financial resource strain: Not on file  . Food insecurity:    Worry: Not on file    Inability: Not on file  . Transportation needs:    Medical:  Not on file    Non-medical: Not on file  Tobacco Use  . Smoking status: Former Smoker    Types: Cigarettes    Last attempt to quit: 12/19/1992    Years since quitting: 24.8  . Smokeless tobacco: Never Used  Substance and Sexual Activity  . Alcohol use: No  . Drug use: No  . Sexual activity: Not Currently  Lifestyle  . Physical activity:    Days per week: Not on file    Minutes per session: Not on file  . Stress: Not on file  Relationships  . Social connections:    Talks on phone: Not on file    Gets together: Not on file    Attends religious service: Not on file    Active member of club or organization: Not on file    Attends meetings of clubs or organizations: Not on file    Relationship status: Not on file  Other Topics Concern  .  Not on file  Social History Narrative  . Not on file    Current Facility-Administered Medications on File Prior to Encounter  Medication Dose Route Frequency Provider Last Rate Last Dose  . cyanocobalamin ((VITAMIN B-12)) injection 1,000 mcg  1,000 mcg Intramuscular Weekly Chipper Herb, MD   1,000 mcg at 10/06/17 1132   Current Outpatient Medications on File Prior to Encounter  Medication Sig Dispense Refill  . aspirin 81 MG EC tablet Take 81 mg by mouth daily.      . Cal Carb-Mag Hydrox-Simeth (ROLAIDS ADVANCED PO) Take 2-4 tablets by mouth as needed (indigestion).    . carboxymethylcellulose (REFRESH PLUS) 0.5 % SOLN Place 1 drop into both eyes as needed (dry eyes).    . Cholecalciferol (VITAMIN D3) 2000 UNITS TABS Take 1 tablet by mouth daily.      . Coenzyme Q10 (CO Q 10 PO) Take 2 capsules by mouth daily.     . fluticasone (FLONASE) 50 MCG/ACT nasal spray USE 2 SPRAYS IN EACH NOSTRIL ONCE DAILY 16 g 4  . hydrochlorothiazide (MICROZIDE) 12.5 MG capsule TAKE 1 CAPSULE BY MOUTH ONCE DAILY 30 capsule 4  . methimazole (TAPAZOLE) 5 MG tablet Take 10 mg by mouth 3 (three) times a week.     . metoprolol tartrate (LOPRESSOR) 25 MG tablet Take 1 tablet (25 mg total) by mouth 2 (two) times daily. 180 tablet 3  . Omega-3 Fatty Acids (FISH OIL) 1000 MG CAPS Take 1 capsule by mouth 2 (two) times daily.      Marland Kitchen omeprazole (PRILOSEC) 40 MG capsule Take 1 capsule (40 mg total) by mouth daily. 30 capsule 3  . Selenium 200 MCG TABS Take 1 tablet by mouth daily.     . budesonide-formoterol (SYMBICORT) 80-4.5 MCG/ACT inhaler Inhale 2 puffs into the lungs 2 (two) times daily. 2 Inhaler 3  . meclizine (ANTIVERT) 25 MG tablet TAKE 1 TABLET (25 MG TOTAL) BY MOUTH 3 (THREE) TIMES DAILY AS NEEDED FOR DIZZINESS. (Patient not taking: Reported on 10/04/2017) 30 tablet 0  . mupirocin ointment (BACTROBAN) 2 % Place 1 application into the nose 2 (two) times daily. (Patient not taking: Reported on 10/09/2017) 22 g 0  .  promethazine (PHENERGAN) 25 MG tablet Take 1 tablet (25 mg total) by mouth every 8 (eight) hours as needed for nausea or vomiting. (Patient not taking: Reported on 10/09/2017) 20 tablet 0  . telmisartan (MICARDIS) 80 MG tablet Take 1 tablet (80 mg total) by mouth daily. 30 tablet 1  Review of Systems: a complete, 10pt review of systems was completed with pertinent positives and negatives as documented in the HPI  Physical Exam: Vitals:   10/10/17 0045 10/10/17 0100  BP: (!) 115/48 (!) 108/56  Pulse: 86 84  Resp:    Temp:    SpO2: 97% 100%   Gen: A&Ox3, no distress  Head: normocephalic, atraumatic Eyes: extraocular motions intact, anicteric.  Neck: supple without mass or thyromegaly Chest: unlabored respirations, symmetrical air entry, clear bilaterally   Cardiovascular: RRR with palpable distal pulses, no pedal edema Abdomen: soft, nondistended, nontender. No mass or organomegaly. Specifically no tenderness in the right upper quadrant or epigastrium. Extremities: warm, without edema, no deformities  Neuro: grossly intact Psych: appropriate mood and affect, normal insight  Skin: warm and dry   CBC Latest Ref Rng & Units 10/09/2017 10/04/2017 07/25/2017  WBC 4.0 - 10.5 K/uL 4.1 3.4 4.0  Hemoglobin 13.0 - 17.0 g/dL 10.9(L) 11.3(L) 13.7  Hematocrit 39.0 - 52.0 % 31.2(L) 31.6(L) 39.9  Platelets 150 - 400 K/uL 155 126(L) 141(L)    CMP Latest Ref Rng & Units 10/09/2017 10/04/2017 07/25/2017  Glucose 65 - 99 mg/dL 94 94 94  BUN 6 - 20 mg/dL 16 16 15   Creatinine 0.61 - 1.24 mg/dL 1.41(H) 1.33(H) 1.20  Sodium 135 - 145 mmol/L 143 141 143  Potassium 3.5 - 5.1 mmol/L 3.6 4.3 4.4  Chloride 101 - 111 mmol/L 109 102 104  CO2 22 - 32 mmol/L 27 23 27   Calcium 8.9 - 10.3 mg/dL 9.3 9.1 9.5  Total Protein 6.5 - 8.1 g/dL 6.5 - 6.9  Total Bilirubin 0.3 - 1.2 mg/dL 0.5 - 0.6  Alkaline Phos 38 - 126 U/L 50 - 63  AST 15 - 41 U/L 16 - 18  ALT 17 - 63 U/L 11(L) - 10    No results found for: INR,  PROTIME  Imaging: US Abdomen Complete  Result Date: 10/09/2017 CLINICAL DATA:  Nausea and diarrhea for 3 months.  Weight loss. EXAM: ABDOMEN ULTRASOUND COMPLETE COMPARISON:  None. FINDINGS: Gallbladder: Borderline gallbladder wall thickening with maximum transverse thickness of 3.8 mm. No pericholecystic fluid. Moderate amount of gallbladder sludge. No sonographic Murphy sign noted by sonographer. Common bile duct: Diameter: 6.3 mm Liver: No focal lesion identified. Within normal limits in parenchymal echogenicity. Two calcified granulomata noted. Portal vein is patent on color Doppler imaging with normal direction of blood flow towards the liver. IVC: No abnormality visualized. Pancreas: Visualized portion unremarkable. Spleen: Size and appearance within normal limits. Right Kidney: Length: 9.9 cm. Echogenicity within normal limits. No mass or hydronephrosis visualized. Left Kidney: Length: 10.5 cm. Echogenicity within normal limits. No mass or hydronephrosis visualized. Abdominal aorta: No aneurysm visualized. Other findings: None. IMPRESSION: Gallbladder sludge with borderline gallbladder wall thickening, which in the appropriate clinical setting is suggestive of acute cholecystitis. Otherwise no acute abnormalities within the abdomen. These results will be called to the ordering clinician or representative by the Radiologist Assistant, and communication documented in the PACS or zVision Dashboard. Electronically Signed   By: Fidela Salisbury M.D.   On: 10/09/2017 11:02   US Pelvis Limited (transabdominal Only)  Result Date: 10/09/2017 CLINICAL DATA:  Evaluation of urinary bladder. EXAM: LIMITED ULTRASOUND OF PELVIS TECHNIQUE: Limited transabdominal ultrasound examination of the pelvis was performed. COMPARISON:  None. FINDINGS: The urinary bladder is nearly collapsed and therefore poorly evaluated. IMPRESSION: Suboptimal visualization of the urinary bladder due to its collapsed state. No gross  abnormalities. Electronically Signed   By:  Fidela Salisbury M.D.   On: 10/09/2017 11:04   Ct Abdomen Pelvis W Contrast  Result Date: 10/10/2017 CLINICAL DATA:  The patient states he has not been feeling well for over a month. Abdominal pain with loss of appetite. EXAM: CT ABDOMEN AND PELVIS WITH CONTRAST TECHNIQUE: Multidetector CT imaging of the abdomen and pelvis was performed using the standard protocol following bolus administration of intravenous contrast. CONTRAST:  180mL OMNIPAQUE IOHEXOL 300 MG/ML  SOLN COMPARISON:  Abdominal ultrasound from 10/09/2017 FINDINGS: Lower chest: The included heart is normal in size without pericardial effusion. No active pulmonary disease. Hepatobiliary: No focal liver abnormality is seen. No gallstones, gallbladder wall thickening, or biliary dilatation. Probable minimal biliary sludge within. The gallbladder is nondistended in appearance. Pancreas: Unremarkable. No pancreatic ductal dilatation or surrounding inflammatory changes. Spleen: Normal in size without focal abnormality. Adrenals/Urinary Tract: Adrenal glands are unremarkable. Kidneys are normal, without renal calculi, focal lesion, or hydronephrosis. Bladder is unremarkable. Stomach/Bowel: Stomach is within normal limits. Appendix appears normal. No evidence of bowel wall thickening, distention, or inflammatory changes. Vascular/Lymphatic: Moderate aortoiliac atherosclerosis. Patent branch vessels. No aneurysm. No lymphadenopathy. Reproductive: Normal size prostate with central zone calcifications. Other: No ascites or free air. Musculoskeletal: Marked degenerative disc flattening and vacuum disc phenomena from L4 through S1. No pars defects or listhesis. Lower lumbar degenerative facet arthropathy is identified. Bones are demineralized in appearance. Right SI joint osteoarthritis with sclerosis. 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 No acute solid nor hollow visceral organ  abnormality. Electronically Signed   By: Ashley Royalty M.D.   On: 10/10/2017 01:43     A/P: unfortunate 77 year old gentleman with refractory nausea an occasional epigastric burning pain. His imaging is not convincing orifices exam for acute cholecystitis. He may have a chronic biliary dyskinesia that is contributing to his nausea. I would recommend admission for fluid resuscitation, symptom management, and ongoing evaluation of his nausea. We'll order a HIDA scan to further evaluate gallbladder etiology. Consider GI consult as well. We'll continue to follow.   Romana Juniper, MD [facility] Surgery, [place] Pager [phone]

## 2017-10-10 NOTE — ED Provider Notes (Signed)
Care assumed from [facility] at shift change, please see her note for full details, but in brief Mario Smith is a 77 y.o. male who presents to the ED for evaluation of colicky epigastric and right upper quadrant pain associated with nausea and diarrhea.  Patient had outpatient right upper quadrant ultrasound which showed possible gallbladder wall thickening.  Laboratory evaluation here in the ED shows no leukocytosis, liver enzymes are stable, no electrolyte derangements.  Lipase is mildly elevated at 65.  CT abdomen pelvis is negative.  General surgery consulted, Dr. Windle Guard to see the patient after she gets out of surgery.  Will follow-up on general surgery recommendations.  3:58 AM spoke with Dr. Windle Guard who saw and evaluated the patient, recommends medicine admission with HIDA scan, potential GI consult for further evaluation of nausea and diarrhea in the setting of this abdominal pain, surgery will follow along, no plan for acute cholecystectomy or other surgery at this time.  6:35 AM book with Dr. Hal Hope who will see and admit the patient.  Surgery to follow along.   Jacqlyn Larsen, PA-C 10/10/17 [id number]    Veatrice Kells, MD 10/10/17 [phone]

## 2017-10-10 NOTE — Progress Notes (Signed)
Admission note:  Arrival Method: Patient arrived from [facility] in w/c. Mental Orientation:  Alert and oriented HOH Telemetry: N/A Assessment: See doc flow sheets Skin: no open areas or any rash noted. IV: Left FA NS @ 100 ml/hr. Pain: Denies any pain. Tubes: N/A Safety Measures: bed in low position, call bell and phone within reach. Fall Prevention Safety Plan: Reviewed the plan, verbalized understanding. Admission Screening: In progress. 6700 Orientation: Patient has been oriented to the unit, staff and to the room.

## 2017-10-10 NOTE — Progress Notes (Signed)
Aware of patient.  Will await HIDA scan prior to further surgical recommendations.  Mario Smith 8:35 AM 10/10/2017

## 2017-10-10 NOTE — ED Notes (Signed)
Transported to nuclear medicine.  

## 2017-10-10 NOTE — ED Notes (Signed)
Got patient on the monitor patient is resting with family at bedside and call bell in reach  °

## 2017-10-10 NOTE — Progress Notes (Signed)
Pt has EGD scheduled for 11 AM tomorrow.    Azucena Freed PA-C

## 2017-10-11 ENCOUNTER — Encounter (HOSPITAL_COMMUNITY): Payer: Self-pay | Admitting: Gastroenterology

## 2017-10-11 ENCOUNTER — Observation Stay (HOSPITAL_COMMUNITY): Payer: Medicare HMO | Admitting: Certified Registered"

## 2017-10-11 ENCOUNTER — Encounter (HOSPITAL_COMMUNITY)
Admission: EM | Disposition: A | Discharge status: Home / Self Care | Payer: Self-pay | Source: Home / Self Care | Attending: Emergency Medicine

## 2017-10-11 DIAGNOSIS — D519 Vitamin B12 deficiency anemia, unspecified: Secondary | ICD-10-CM | POA: Diagnosis not present

## 2017-10-11 DIAGNOSIS — R1013 Epigastric pain: Secondary | ICD-10-CM | POA: Diagnosis not present

## 2017-10-11 DIAGNOSIS — N179 Acute kidney failure, unspecified: Secondary | ICD-10-CM | POA: Diagnosis not present

## 2017-10-11 DIAGNOSIS — K3189 Other diseases of stomach and duodenum: Secondary | ICD-10-CM

## 2017-10-11 DIAGNOSIS — I10 Essential (primary) hypertension: Secondary | ICD-10-CM

## 2017-10-11 DIAGNOSIS — R110 Nausea: Secondary | ICD-10-CM | POA: Diagnosis not present

## 2017-10-11 DIAGNOSIS — E0590 Thyrotoxicosis, unspecified without thyrotoxic crisis or storm: Secondary | ICD-10-CM

## 2017-10-11 DIAGNOSIS — R197 Diarrhea, unspecified: Secondary | ICD-10-CM | POA: Diagnosis not present

## 2017-10-11 DIAGNOSIS — E785 Hyperlipidemia, unspecified: Secondary | ICD-10-CM | POA: Diagnosis not present

## 2017-10-11 DIAGNOSIS — R42 Dizziness and giddiness: Secondary | ICD-10-CM | POA: Diagnosis not present

## 2017-10-11 DIAGNOSIS — K2950 Unspecified chronic gastritis without bleeding: Secondary | ICD-10-CM | POA: Diagnosis not present

## 2017-10-11 DIAGNOSIS — Z79899 Other long term (current) drug therapy: Secondary | ICD-10-CM | POA: Diagnosis not present

## 2017-10-11 DIAGNOSIS — R112 Nausea with vomiting, unspecified: Secondary | ICD-10-CM | POA: Diagnosis not present

## 2017-10-11 DIAGNOSIS — K828 Other specified diseases of gallbladder: Secondary | ICD-10-CM | POA: Diagnosis not present

## 2017-10-11 DIAGNOSIS — D539 Nutritional anemia, unspecified: Secondary | ICD-10-CM | POA: Diagnosis not present

## 2017-10-11 DIAGNOSIS — K2970 Gastritis, unspecified, without bleeding: Secondary | ICD-10-CM | POA: Diagnosis not present

## 2017-10-11 DIAGNOSIS — E0500 Thyrotoxicosis with diffuse goiter without thyrotoxic crisis or storm: Secondary | ICD-10-CM | POA: Diagnosis not present

## 2017-10-11 DIAGNOSIS — K2900 Acute gastritis without bleeding: Secondary | ICD-10-CM

## 2017-10-11 DIAGNOSIS — Z7982 Long term (current) use of aspirin: Secondary | ICD-10-CM | POA: Diagnosis not present

## 2017-10-11 DIAGNOSIS — E860 Dehydration: Secondary | ICD-10-CM | POA: Diagnosis not present

## 2017-10-11 DIAGNOSIS — D696 Thrombocytopenia, unspecified: Secondary | ICD-10-CM | POA: Diagnosis not present

## 2017-10-11 DIAGNOSIS — B9681 Helicobacter pylori [H. pylori] as the cause of diseases classified elsewhere: Secondary | ICD-10-CM | POA: Diagnosis not present

## 2017-10-11 DIAGNOSIS — Z87891 Personal history of nicotine dependence: Secondary | ICD-10-CM | POA: Diagnosis not present

## 2017-10-11 DIAGNOSIS — R933 Abnormal findings on diagnostic imaging of other parts of digestive tract: Secondary | ICD-10-CM | POA: Diagnosis not present

## 2017-10-11 HISTORY — PX: ESOPHAGOGASTRODUODENOSCOPY (EGD) WITH PROPOFOL: SHX5813

## 2017-10-11 LAB — COMPREHENSIVE METABOLIC PANEL
ALBUMIN: 2.8 g/dL — AB (ref 3.5–5.0)
ALK PHOS: 37 U/L — AB (ref 38–126)
ALT: 9 U/L — ABNORMAL LOW (ref 17–63)
ANION GAP: 7 (ref 5–15)
AST: 13 U/L — ABNORMAL LOW (ref 15–41)
BUN: 7 mg/dL (ref 6–20)
CALCIUM: 8.1 mg/dL — AB (ref 8.9–10.3)
CHLORIDE: 112 mmol/L — AB (ref 101–111)
CO2: 25 mmol/L (ref 22–32)
Creatinine, Ser: 1.23 mg/dL (ref 0.61–1.24)
GFR calc Af Amer: >60 mL/min (ref >60)
GFR calc non Af Amer: 55 mL/min — ABNORMAL LOW (ref >60)
GLUCOSE: 88 mg/dL (ref 65–99)
Potassium: 3.4 mmol/L — ABNORMAL LOW (ref 3.5–5.1)
SODIUM: 144 mmol/L (ref 135–145)
Total Bilirubin: 0.7 mg/dL (ref 0.3–1.2)
Total Protein: 4.7 g/dL — ABNORMAL LOW (ref 6.5–8.1)

## 2017-10-11 LAB — H PYLORI, IGM, IGG, IGA AB
H. Pylogi, Iga Abs: <9 units (ref 0.0–8.9)
H. Pylogi, Igm Abs: 27.2 units — ABNORMAL HIGH (ref 0.0–8.9)

## 2017-10-11 LAB — CBC
HEMATOCRIT: 27.1 % — AB (ref 39.0–52.0)
HEMOGLOBIN: 9.2 g/dL — AB (ref 13.0–17.0)
MCH: 37.6 pg — AB (ref 26.0–34.0)
MCHC: 33.9 g/dL (ref 30.0–36.0)
MCV: 110.6 fL — AB (ref 78.0–100.0)
Platelets: 153 10*3/uL (ref 150–400)
RBC: 2.45 MIL/uL — ABNORMAL LOW (ref 4.22–5.81)
RDW: 17.5 % — ABNORMAL HIGH (ref 11.5–15.5)
WBC: 3.1 10*3/uL — ABNORMAL LOW (ref 4.0–10.5)

## 2017-10-11 SURGERY — ESOPHAGOGASTRODUODENOSCOPY (EGD) WITH PROPOFOL
Anesthesia: Monitor Anesthesia Care

## 2017-10-11 MED ORDER — POTASSIUM CHLORIDE CRYS ER 20 MEQ PO TBCR
40.0000 meq | EXTENDED_RELEASE_TABLET | Freq: Once | ORAL | Status: AC
  Administered 2017-10-11: 40 meq via ORAL
  Filled 2017-10-11: qty 2

## 2017-10-11 MED ORDER — PROPOFOL 10 MG/ML IV BOLUS
INTRAVENOUS | Status: DC | PRN
  Administered 2017-10-11 (×3): 20 mg via INTRAVENOUS

## 2017-10-11 MED ORDER — LIDOCAINE 2% (20 MG/ML) 5 ML SYRINGE
INTRAMUSCULAR | Status: DC | PRN
  Administered 2017-10-11: 100 mg via INTRAVENOUS

## 2017-10-11 MED ORDER — PHENYLEPHRINE 40 MCG/ML (10ML) SYRINGE FOR IV PUSH (FOR BLOOD PRESSURE SUPPORT)
PREFILLED_SYRINGE | INTRAVENOUS | Status: DC | PRN
  Administered 2017-10-11: 120 ug via INTRAVENOUS

## 2017-10-11 MED ORDER — LACTATED RINGERS IV SOLN
INTRAVENOUS | Status: DC | PRN
  Administered 2017-10-11: 11:00:00 via INTRAVENOUS

## 2017-10-11 MED ORDER — PROPOFOL 500 MG/50ML IV EMUL
INTRAVENOUS | Status: DC | PRN
  Administered 2017-10-11: 100 ug/kg/min via INTRAVENOUS

## 2017-10-11 SURGICAL SUPPLY — 15 items

## 2017-10-11 NOTE — Progress Notes (Signed)
Patient ID: Mario Smith, male   DOB: 03/10/1941, 77 y.o.   MRN: [id number]    Day of Surgery  Subjective: Pt feels a little better today after getting hydrated with some fluid.  Ready for EGD today.  Objective: Vital signs in last 24 hours: Temp:  [97.7 F (36.5 C)-98.4 F (36.9 C)] 98.4 F (36.9 C) (05/08 0527) Pulse Rate:  [73-86] 77 (05/08 0527) Resp:  [13-20] 20 (05/07 1700) BP: (96-125)/(68-96) 125/77 (05/08 0527) SpO2:  [90 %-100 %] 98 % (05/08 0527) Weight:  [75.4 kg (166 lb 3.6 oz)] 75.4 kg (166 lb 3.6 oz) (05/07 2016) Last BM Date: 10/10/17  Intake/Output from previous day: 05/07 0701 - 05/08 0700 In: 2080 [I.V.:1980; IV Piggyback:100] Out: 0  Intake/Output this shift: No intake/output data recorded.  PE: Heart: regular Lungs: CTAB Abd: soft, NT, ND, +BS  Lab Results:  Recent Labs    10/09/17 1254 10/11/17 0458  WBC 4.1 3.1*  HGB 10.9* 9.2*  HCT 31.2* 27.1*  PLT 155 153   BMET Recent Labs    10/09/17 1254 10/11/17 0458  NA 143 144  K 3.6 3.4*  CL 109 112*  CO2 27 25  GLUCOSE 94 88  BUN 16 7  CREATININE 1.41* 1.23  CALCIUM 9.3 8.1*   PT/INR No results for input(s): LABPROT, INR in the last 72 hours. CMP     Component Value Date/Time   NA 144 10/11/2017 0458   NA 141 10/04/2017 1217   K 3.4 (L) 10/11/2017 0458   CL 112 (H) 10/11/2017 0458   CO2 25 10/11/2017 0458   GLUCOSE 88 10/11/2017 0458   BUN 7 10/11/2017 0458   BUN 16 10/04/2017 1217   CREATININE 1.23 10/11/2017 0458   CREATININE 1.06 12/12/2012 1057   CALCIUM 8.1 (L) 10/11/2017 0458   PROT 4.7 (L) 10/11/2017 0458   PROT 6.9 07/25/2017 1450   ALBUMIN 2.8 (L) 10/11/2017 0458   ALBUMIN 4.4 07/25/2017 1450   AST 13 (L) 10/11/2017 0458   ALT 9 (L) 10/11/2017 0458   ALKPHOS 37 (L) 10/11/2017 0458   BILITOT 0.7 10/11/2017 0458   BILITOT 0.6 07/25/2017 1450   GFRNONAA 55 (L) 10/11/2017 0458   GFRNONAA 70 12/12/2012 1057   GFRAA >60 10/11/2017 0458   GFRAA 81 12/12/2012 1057    Lipase     Component Value Date/Time   LIPASE 65 (H) 10/09/2017 1254       Studies/Results: Ct Abdomen Pelvis W Contrast  Result Date: 10/10/2017 CLINICAL DATA:  The patient states he has not been feeling well for over a month. Abdominal pain with loss of appetite. EXAM: CT ABDOMEN AND PELVIS WITH CONTRAST TECHNIQUE: Multidetector CT imaging of the abdomen and pelvis was performed using the standard protocol following bolus administration of intravenous contrast. CONTRAST:  153mL OMNIPAQUE IOHEXOL 300 MG/ML  SOLN COMPARISON:  Abdominal ultrasound from 10/09/2017 FINDINGS: Lower chest: The included heart is normal in size without pericardial effusion. No active pulmonary disease. Hepatobiliary: No focal liver abnormality is seen. No gallstones, gallbladder wall thickening, or biliary dilatation. Probable minimal biliary sludge within. The gallbladder is nondistended in appearance. Pancreas: Unremarkable. No pancreatic ductal dilatation or surrounding inflammatory changes. Spleen: Normal in size without focal abnormality. Adrenals/Urinary Tract: Adrenal glands are unremarkable. Kidneys are normal, without renal calculi, focal lesion, or hydronephrosis. Bladder is unremarkable. Stomach/Bowel: Stomach is within normal limits. Appendix appears normal. No evidence of bowel wall thickening, distention, or inflammatory changes. Vascular/Lymphatic: Moderate aortoiliac atherosclerosis. Patent branch vessels. No  aneurysm. No lymphadenopathy. Reproductive: Normal size prostate with central zone calcifications. Other: No ascites or free air. Musculoskeletal: Marked degenerative disc flattening and vacuum disc phenomena from L4 through S1. No pars defects or listhesis. Lower lumbar degenerative facet arthropathy is identified. Bones are demineralized in appearance. Right SI joint osteoarthritis with sclerosis. 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 No acute solid nor hollow visceral organ  abnormality. Electronically Signed   By: Ashley Royalty M.D.   On: 10/10/2017 01:43   Nm Hepato W/eject Fract  Result Date: 10/10/2017 CLINICAL DATA:  Patient with approximately 2 month history of intermittent but frequent episodes of vertigo, associated with nausea. There or also episodes of vomiting. No reported abdominal pain. EXAM: NUCLEAR MEDICINE HEPATOBILIARY IMAGING WITH GALLBLADDER EF TECHNIQUE: Sequential images of the abdomen were obtained out to 60 minutes following intravenous administration of radiopharmaceutical. After oral ingestion of Ensure, gallbladder ejection fraction was determined. At 60 min, normal ejection fraction is greater than 33%. RADIOPHARMACEUTICALS:  4.7 mCi Tc-23m  Choletec IV COMPARISON:  CT, 10/10/2017. Ultrasound, 10/09/2017. Ultrasound demonstrated gallbladder sludge with borderline wall thickening. FINDINGS: Prompt uptake and biliary excretion of activity by the liver is seen. Gallbladder activity is visualized, consistent with patency of cystic duct. Biliary activity passes into small bowel, consistent with patent common bile duct. Additional imaging was performed following the ingestion of Ensure to promote gallbladder contraction. Calculated gallbladder ejection fraction is 9%. (Normal gallbladder ejection fraction with Ensure is greater than 33%.) IMPRESSION: 1. No evidence of acute cholecystitis. Patent cystic and common bile ducts. Normal appearance of the liver. 2. And normal gallbladder function with a reduced ejection fraction of 9%. Electronically Signed   By: Lajean Manes M.D.   On: 10/10/2017 14:36    Anti-infectives: Anti-infectives (From admission, onward)   None       Assessment/Plan Nausea, vomiting, anorexia Vertigo Mild acute kidney injury Gallbladder sludge Low gallbladder ejection fraction Graves' disease B12 deficiency  Biliary dyskinesia -HIDA was negative for cholecystitis, but did show and EF of 9%.  It is still indeterminate if this  is causing his symptoms.  He is proceeding with an EGD today to rule out PUD or other source for his symptoms.  If this is negative, then we will plan to proceed with a lap chole tomorrow.  FEN - NPO for EGD VTE - SCDs/Lovenox ID - none   LOS: 0 days    Henreitta Cea , [facility] 10/11/2017, 10:04 AM Pager: [phone]

## 2017-10-11 NOTE — Anesthesia Procedure Notes (Signed)
Procedure Name: MAC Date/Time: 10/11/2017 11:21 AM Performed by: Imagene Riches, CRNA Pre-anesthesia Checklist: Patient identified, Emergency Drugs available, Suction available, Patient being monitored and Timeout performed Patient Re-evaluated:Patient Re-evaluated prior to induction Oxygen Delivery Method: Nasal cannula Induction Type: IV induction

## 2017-10-11 NOTE — Care Management Note (Addendum)
Case Management Note  Patient Details  Name: Mario Smith MRN: [id number] Date of Birth: Nov 01, 1940  Subjective/Objective: History of hypothyroidism, hypertension, dyslipidemia, intermittent vertigo; Admitted for Dyspepsia/persistent nausea PCP noted.              Action/Plan: Gi consulted- Endoscopy on 5/8.  Prior to admission patient lived at home with spouse.  At discharge plans to return to previous living situation.  At discharge has transportation home.  NCM will continue to follow for discharge transition needs.  Expected Discharge Date:   To Be determined            Expected Discharge Plan:   To Be determined  In-House Referral:   N/A  Discharge planning Services   CM cosult  Post Acute Care Choice:    Choice offered to:     DME Arranged:    DME Agency:     HH Arranged:    HH Agency:     Status of Service:   In progress will continue to follow.  Kristen Cardinal, RN 10/11/2017, 12:17 PM

## 2017-10-11 NOTE — Progress Notes (Signed)
PROGRESS NOTE        PATIENT DETAILS Name: Mario Smith Age: 77 y.o. Sex: male Date of Birth: 12/05/1940 Admit Date: 10/09/2017 Admitting Physician Lady Deutscher, MD UKG:URKYH, Estella Husk, MD  Brief Narrative: Patient is a 77 y.o. male with history of hypothyroidism, hypertension, dyslipidemia, intermittent vertigo-admitted for inpatient evaluation of worsening abdominal pain and nausea and vomiting.  See below for further details  Subjective: Lying comfortably in bed when I saw him this morning.  No chest pain or shortness of breath.  No abdominal pain.  Assessment/Plan: Dyspepsia/persistent nausea: Etiology uncertain-he does appear to have dyskinesia (HIDA scan with EF of around 9%)-underwent endoscopy today with findings of gastritis.  Continue Pepcid-General surgery contemplating laparoscopic cholecystectomy on 5/9.    Hypothyroidism: Continue Tapazole.  Hypokalemia: Replete and recheck  Vertigo: Probably peripheral vertigo-likely BPPV- denies any vertigo this morning-if reoccurs-can consider vestibular PT for Epley's maneuver.  Vitamin B12 deficiency: Has been initiated on vitamin B12 injections by PCP.  Hemoglobin-drop in hemoglobin is probably secondary to IV fluids rather than acute blood loss.  Hypertension: Currently controlled-resume antihypertensives prior to discharge.  DVT Prophylaxis:  SCD's  Code Status:  DNR  Family Communication: Spouse at bedside  Disposition Plan: Remain inpatient-home in the next 1-2 days.  Antimicrobial agents: Anti-infectives (From admission, onward)   None      Procedures: 5/8>> EGD  CONSULTS:  GI and general surgery  Time spent: 25- minutes-Greater than 50% of this time was spent in counseling, explanation of diagnosis, planning of further management, and coordination of care.  MEDICATIONS: Scheduled Meds: . enoxaparin (LOVENOX) injection  40 mg Subcutaneous Q24H  . feeding supplement  1  Container Oral TID BM  . fluticasone  2 spray Each Nare Daily  . folic acid  1 mg Intravenous Daily  . methimazole  10 mg Oral Once per day on Mon Wed Fri  . mometasone-formoterol  2 puff Inhalation BID  . sodium chloride flush  3 mL Intravenous Q12H  . thiamine injection  100 mg Intravenous Daily   Continuous Infusions: . sodium chloride 100 mL/hr at 10/10/17 2109  . famotidine (PEPCID) IV Stopped (10/11/17 0936)   PRN Meds:.acetaminophen **OR** acetaminophen, morphine injection, ondansetron (ZOFRAN) IV **OR** promethazine, polyvinyl alcohol   PHYSICAL EXAM: Vital signs: Vitals:   10/11/17 1027 10/11/17 1131 10/11/17 1140 10/11/17 1150  BP: 139/63 (!) 125/52 (!) 110/50 (!) 119/97  Pulse: 71 74 75 74  Resp: 11 16 12 15   Temp: 98.7 F (37.1 C) 97.9 F (36.6 C)    TempSrc: Oral Oral    SpO2: 97% 100% 99% 99%  Weight:      Height:       Filed Weights   10/10/17 2016  Weight: 75.4 kg (166 lb 3.6 oz)   Body mass index is 24.55 kg/m.   General appearance :Awake, alert, not in any distress.  Eyes:, pupils equally reactive to light and accomodation,no scleral icterus.Pink conjunctiva HEENT: Atraumatic and Normocephalic Neck: supple, no JVD.  Resp:Good air entry bilaterally, no added sounds  CVS: S1 S2 regular, no murmurs.  GI: Bowel sounds present, Non tender and not distended with no gaurding, rigidity or rebound.No organomegaly Extremities: B/L Lower Ext shows no edema, both legs are warm to touch Neurology:  speech clear,Non focal, sensation is grossly intact. Musculoskeletal:No digital cyanosis Skin:No Rash, warm and dry  Wounds:N/A  I have personally reviewed following labs and imaging studies  LABORATORY DATA: CBC: Recent Labs  Lab 10/04/17 1217 10/09/17 1254 10/11/17 0458  WBC 3.4 4.1 3.1*  NEUTROABS 1.6  --   --   HGB 11.3* 10.9* 9.2*  HCT 31.6* 31.2* 27.1*  MCV 107* 110.2* 110.6*  PLT 126* 155 381    Basic Metabolic Panel: Recent Labs  Lab  10/04/17 1217 10/09/17 1254 10/10/17 1445 10/11/17 0458  NA 141 143  --  144  K 4.3 3.6  --  3.4*  CL 102 109  --  112*  CO2 23 27  --  25  GLUCOSE 94 94  --  88  BUN 16 16  --  7  CREATININE 1.33* 1.41*  --  1.23  CALCIUM 9.1 9.3  --  8.1*  MG  --   --  2.0  --   PHOS  --   --  2.8  --     GFR: Estimated Creatinine Clearance: 50.3 mL/min (by C-G formula based on SCr of 1.23 mg/dL).  Liver Function Tests: Recent Labs  Lab 10/09/17 1254 10/11/17 0458  AST 16 13*  ALT 11* 9*  ALKPHOS 50 37*  BILITOT 0.5 0.7  PROT 6.5 4.7*  ALBUMIN 3.8 2.8*   Recent Labs  Lab 10/09/17 1254  LIPASE 65*   No results for input(s): AMMONIA in the last 168 hours.  Coagulation Profile: No results for input(s): INR, PROTIME in the last 168 hours.  Cardiac Enzymes: No results for input(s): CKTOTAL, CKMB, CKMBINDEX, TROPONINI in the last 168 hours.  BNP (last 3 results) No results for input(s): PROBNP in the last 8760 hours.  HbA1C: No results for input(s): HGBA1C in the last 72 hours.  CBG: No results for input(s): GLUCAP in the last 168 hours.  Lipid Profile: No results for input(s): CHOL, HDL, LDLCALC, TRIG, CHOLHDL, LDLDIRECT in the last 72 hours.  Thyroid Function Tests: No results for input(s): TSH, T4TOTAL, FREET4, T3FREE, THYROIDAB in the last 72 hours.  Anemia Panel: No results for input(s): VITAMINB12, FOLATE, FERRITIN, TIBC, IRON, RETICCTPCT in the last 72 hours.  Urine analysis:    Component Value Date/Time   COLORURINE YELLOW 10/09/2017 1922   APPEARANCEUR HAZY (A) 10/09/2017 1922   APPEARANCEUR Clear 07/28/2017 0940   LABSPEC 1.029 10/09/2017 1922   PHURINE 5.0 10/09/2017 1922   GLUCOSEU NEGATIVE 10/09/2017 1922   HGBUR NEGATIVE 10/09/2017 1922   BILIRUBINUR SMALL (A) 10/09/2017 1922   BILIRUBINUR Negative 07/28/2017 0940   KETONESUR 5 (A) 10/09/2017 1922   PROTEINUR 30 (A) 10/09/2017 1922   UROBILINOGEN negative 12/29/2014 1642   NITRITE NEGATIVE  10/09/2017 1922   LEUKOCYTESUR NEGATIVE 10/09/2017 1922   LEUKOCYTESUR Negative 07/28/2017 0940    Sepsis Labs: Lactic Acid, Venous No results found for: LATICACIDVEN  MICROBIOLOGY: Recent Results (from the past 240 hour(s))  Fecal occult blood, imunochemical     Status: None   Collection Time: 10/06/17  5:03 AM  Result Value Ref Range Status   Fecal Occult Bld Negative Negative Final    RADIOLOGY STUDIES/RESULTS: US Abdomen Complete  Result Date: 10/09/2017 CLINICAL DATA:  Nausea and diarrhea for 3 months.  Weight loss. EXAM: ABDOMEN ULTRASOUND COMPLETE COMPARISON:  None. FINDINGS: Gallbladder: Borderline gallbladder wall thickening with maximum transverse thickness of 3.8 mm. No pericholecystic fluid. Moderate amount of gallbladder sludge. No sonographic Murphy sign noted by sonographer. Common bile duct: Diameter: 6.3 mm Liver: No focal lesion identified. Within normal limits in parenchymal  echogenicity. Two calcified granulomata noted. Portal vein is patent on color Doppler imaging with normal direction of blood flow towards the liver. IVC: No abnormality visualized. Pancreas: Visualized portion unremarkable. Spleen: Size and appearance within normal limits. Right Kidney: Length: 9.9 cm. Echogenicity within normal limits. No mass or hydronephrosis visualized. Left Kidney: Length: 10.5 cm. Echogenicity within normal limits. No mass or hydronephrosis visualized. Abdominal aorta: No aneurysm visualized. Other findings: None. IMPRESSION: Gallbladder sludge with borderline gallbladder wall thickening, which in the appropriate clinical setting is suggestive of acute cholecystitis. Otherwise no acute abnormalities within the abdomen. These results will be called to the ordering clinician or representative by the Radiologist Assistant, and communication documented in the PACS or zVision Dashboard. Electronically Signed   By: Fidela Salisbury M.D.   On: 10/09/2017 11:02   US Pelvis Limited  (transabdominal Only)  Result Date: 10/09/2017 CLINICAL DATA:  Evaluation of urinary bladder. EXAM: LIMITED ULTRASOUND OF PELVIS TECHNIQUE: Limited transabdominal ultrasound examination of the pelvis was performed. COMPARISON:  None. FINDINGS: The urinary bladder is nearly collapsed and therefore poorly evaluated. IMPRESSION: Suboptimal visualization of the urinary bladder due to its collapsed state. No gross abnormalities. Electronically Signed   By: Fidela Salisbury M.D.   On: 10/09/2017 11:04   Ct Abdomen Pelvis W Contrast  Result Date: 10/10/2017 CLINICAL DATA:  The patient states he has not been feeling well for over a month. Abdominal pain with loss of appetite. EXAM: CT ABDOMEN AND PELVIS WITH CONTRAST TECHNIQUE: Multidetector CT imaging of the abdomen and pelvis was performed using the standard protocol following bolus administration of intravenous contrast. CONTRAST:  1105mL OMNIPAQUE IOHEXOL 300 MG/ML  SOLN COMPARISON:  Abdominal ultrasound from 10/09/2017 FINDINGS: Lower chest: The included heart is normal in size without pericardial effusion. No active pulmonary disease. Hepatobiliary: No focal liver abnormality is seen. No gallstones, gallbladder wall thickening, or biliary dilatation. Probable minimal biliary sludge within. The gallbladder is nondistended in appearance. Pancreas: Unremarkable. No pancreatic ductal dilatation or surrounding inflammatory changes. Spleen: Normal in size without focal abnormality. Adrenals/Urinary Tract: Adrenal glands are unremarkable. Kidneys are normal, without renal calculi, focal lesion, or hydronephrosis. Bladder is unremarkable. Stomach/Bowel: Stomach is within normal limits. Appendix appears normal. No evidence of bowel wall thickening, distention, or inflammatory changes. Vascular/Lymphatic: Moderate aortoiliac atherosclerosis. Patent branch vessels. No aneurysm. No lymphadenopathy. Reproductive: Normal size prostate with central zone calcifications. Other:  No ascites or free air. Musculoskeletal: Marked degenerative disc flattening and vacuum disc phenomena from L4 through S1. No pars defects or listhesis. Lower lumbar degenerative facet arthropathy is identified. Bones are demineralized in appearance. Right SI joint osteoarthritis with sclerosis. 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 No acute solid nor hollow visceral organ abnormality. Electronically Signed   By: Ashley Royalty M.D.   On: 10/10/2017 01:43   Nm Hepato W/eject Fract  Result Date: 10/10/2017 CLINICAL DATA:  Patient with approximately 2 month history of intermittent but frequent episodes of vertigo, associated with nausea. There or also episodes of vomiting. No reported abdominal pain. EXAM: NUCLEAR MEDICINE HEPATOBILIARY IMAGING WITH GALLBLADDER EF TECHNIQUE: Sequential images of the abdomen were obtained out to 60 minutes following intravenous administration of radiopharmaceutical. After oral ingestion of Ensure, gallbladder ejection fraction was determined. At 60 min, normal ejection fraction is greater than 33%. RADIOPHARMACEUTICALS:  4.7 mCi Tc-69m  Choletec IV COMPARISON:  CT, 10/10/2017. Ultrasound, 10/09/2017. Ultrasound demonstrated gallbladder sludge with borderline wall thickening. FINDINGS: Prompt uptake and biliary excretion of activity by the liver is seen. Gallbladder  activity is visualized, consistent with patency of cystic duct. Biliary activity passes into small bowel, consistent with patent common bile duct. Additional imaging was performed following the ingestion of Ensure to promote gallbladder contraction. Calculated gallbladder ejection fraction is 9%. (Normal gallbladder ejection fraction with Ensure is greater than 33%.) IMPRESSION: 1. No evidence of acute cholecystitis. Patent cystic and common bile ducts. Normal appearance of the liver. 2. And normal gallbladder function with a reduced ejection fraction of 9%. Electronically Signed   By: Lajean Manes M.D.   On: 10/10/2017 14:36     LOS: 0 days   Oren Binet, MD  [facility] Pager:[phone]  If 7PM-7AM, please contact night-coverage [email or web address] Password [email or web address] 10/11/2017, 12:02 PM

## 2017-10-11 NOTE — Interval H&P Note (Signed)
History and Physical Interval Note:  10/11/2017 10:20 AM  Mario Smith  has presented today for surgery, with the diagnosis of nausea  The various methods of treatment have been discussed with the patient and family. After consideration of risks, benefits and other options for treatment, the patient has consented to  Procedure(s): ESOPHAGOGASTRODUODENOSCOPY (EGD) WITH PROPOFOL (N/A) as a surgical intervention .  The patient's history has been reviewed, patient examined, no change in status, stable for surgery.  I have reviewed the patient's chart and labs.  Questions were answered to the patient's satisfaction.     Irving Lubbers

## 2017-10-11 NOTE — Op Note (Addendum)
[facility] Patient Name: Mario Smith Procedure Date : 10/11/2017 MRN: [id number] Attending MD: Mauri Pole , MD Date of Birth: 1941/05/12 CSN: [id number] Age: 77 Admit Type: Inpatient Procedure:                Upper GI endoscopy Indications:              Dyspepsia, Nausea Providers:                Mauri Pole, MD, Burtis Junes, RN, Alan Mulder, Technician Referring MD:              Medicines:                Monitored Anesthesia Care Complications:            No immediate complications. Estimated Blood Loss:     Estimated blood loss was minimal. Procedure:                Pre-Anesthesia Assessment:                           - Prior to the procedure, a History and Physical                            was performed, and patient medications and                            allergies were reviewed. The patient's tolerance of                            previous anesthesia was also reviewed. The risks                            and benefits of the procedure and the sedation                            options and risks were discussed with the patient.                            All questions were answered, and informed consent                            was obtained. Prior Anticoagulants: The patient has                            taken no previous anticoagulant or antiplatelet                            agents. ASA Grade Assessment: III - A patient with                            severe systemic disease. After reviewing the risks  and benefits, the patient was deemed in                            satisfactory condition to undergo the procedure.                           After obtaining informed consent, the endoscope was                            passed under direct vision. Throughout the                            procedure, the patient's blood pressure, pulse, and                            oxygen saturations  were monitored continuously. The                            [id number] ([id number]) scope was introduced through the                            mouth, and advanced to the second part of duodenum.                            The upper GI endoscopy was accomplished without                            difficulty. The patient tolerated the procedure                            well. Scope In: Scope Out: Findings:      Esophagogastric landmarks were identified: the Z-line was found at 35 cm       from the incisors.      The examined esophagus was normal.      Localized moderate inflammation characterized by erythema and friability       was found in the gastric fundus. Biopsies were taken with a cold forceps       for histology. Biopsies were taken with a cold forceps for Helicobacter       pylori testing from antrum and body.      The exam of the stomach was otherwise normal.      The examined duodenum was normal. Impression:               - Esophagogastric landmarks identified.                           - Normal esophagus.                           - Gastritis in the fundus suggestive of possible                            pill gastritis. Biopsied.                           -  Normal examined duodenum. Recommendation:           - Resume previous diet.                           - Continue present medications.                           - Pepcid twice daily                           - Await pathology results.                           - No ibuprofen, naproxen, or other non-steroidal                            anti-inflammatory drugs.                           - Follow an antireflux regimen. Procedure Code(s):        --- Professional ---                           [phone], Esophagogastroduodenoscopy, flexible,                            transoral; with biopsy, single or multiple Diagnosis Code(s):        --- Professional ---                           K29.70, Gastritis, unspecified, without bleeding                            R10.13, Epigastric pain                           R11.0, Nausea CPT copyright 2017 [employer]. All rights reserved. The codes documented in this report are preliminary and upon coder review may  be revised to meet current compliance requirements. Mauri Pole, MD 10/11/2017 11:45:55 AM This report has been signed electronically. Number of Addenda: 0

## 2017-10-11 NOTE — Anesthesia Preprocedure Evaluation (Signed)
Anesthesia Evaluation  Patient identified by MRN, date of birth, ID band Patient awake    Reviewed: Allergy & Precautions, NPO status , Patient's Chart, lab work & pertinent test results, reviewed documented beta blocker date and time   History of Anesthesia Complications (+) PONV and history of anesthetic complications  Airway Mallampati: II  TM Distance: >3 FB Neck ROM: Limited    Dental  (+) Teeth Intact, Dental Advisory Given   Pulmonary former smoker,    Pulmonary exam normal breath sounds clear to auscultation       Cardiovascular hypertension, Pt. on home beta blockers and Pt. on medications (-) angina(-) Past MI Normal cardiovascular exam Rhythm:Regular Rate:Normal     Neuro/Psych ACDF    GI/Hepatic negative GI ROS, Neg liver ROS,   Endo/Other  Hyperthyroidism Grave's disease  Renal/GU negative Renal ROS     Musculoskeletal negative musculoskeletal ROS (+)   Abdominal   Peds  Hematology  (+) Blood dyscrasia, anemia ,   Anesthesia Other Findings Day of surgery medications reviewed with the patient.  Reproductive/Obstetrics                             Anesthesia Physical Anesthesia Plan  ASA: III  Anesthesia Plan: MAC   Post-op Pain Management:    Induction: Intravenous  PONV Risk Score and Plan: 2 and Propofol infusion, Treatment may vary due to age or medical condition and Ondansetron  Airway Management Planned: Nasal Cannula  Additional Equipment:   Intra-op Plan:   Post-operative Plan:   Informed Consent: I have reviewed the patients History and Physical, chart, labs and discussed the procedure including the risks, benefits and alternatives for the proposed anesthesia with the patient or authorized representative who has indicated his/her understanding and acceptance.   Dental advisory given  Plan Discussed with: CRNA and Anesthesiologist  Anesthesia Plan  Comments: (Discussed risks/benefits/alternatives to MAC sedation including need for ventilatory support, hypotension, need for conversion to general anesthesia.  All patient questions answered.  Patient/guardian wishes to proceed.)        Anesthesia Quick Evaluation

## 2017-10-11 NOTE — Transfer of Care (Signed)
Immediate Anesthesia Transfer of Care Note  Patient: Neziah R Horne  Procedure(s) Performed: ESOPHAGOGASTRODUODENOSCOPY (EGD) WITH PROPOFOL (N/A )  Patient Location: Endoscopy Unit  Anesthesia Type:MAC  Level of Consciousness: drowsy  Airway & Oxygen Therapy: Patient Spontanous Breathing and Patient connected to nasal cannula oxygen  Post-op Assessment: Report given to RN and Post -op Vital signs reviewed and stable  Post vital signs: Reviewed and stable  Last Vitals:  Vitals Value Taken Time  BP 125/52 10/11/2017 11:31 AM  Temp    Pulse 74 10/11/2017 11:31 AM  Resp 16 10/11/2017 11:31 AM  SpO2 100 % 10/11/2017 11:31 AM    Last Pain:  Vitals:   10/11/17 1131  TempSrc: Oral  PainSc:          Complications: No apparent anesthesia complications

## 2017-10-11 NOTE — Care Management Obs Status (Signed)
[place] NOTIFICATION   Patient Details  Name: Mario Smith MRN: [id number] Date of Birth: [birth year]   Medicare Observation Status Notification Given:  Yes    Carles Collet, RN 10/11/2017, 2:33 PM

## 2017-10-12 DIAGNOSIS — K3189 Other diseases of stomach and duodenum: Secondary | ICD-10-CM | POA: Diagnosis not present

## 2017-10-12 DIAGNOSIS — R112 Nausea with vomiting, unspecified: Secondary | ICD-10-CM | POA: Diagnosis not present

## 2017-10-12 DIAGNOSIS — R197 Diarrhea, unspecified: Secondary | ICD-10-CM | POA: Diagnosis not present

## 2017-10-12 DIAGNOSIS — E860 Dehydration: Secondary | ICD-10-CM | POA: Diagnosis not present

## 2017-10-12 DIAGNOSIS — E0590 Thyrotoxicosis, unspecified without thyrotoxic crisis or storm: Secondary | ICD-10-CM | POA: Diagnosis not present

## 2017-10-12 DIAGNOSIS — K2970 Gastritis, unspecified, without bleeding: Secondary | ICD-10-CM

## 2017-10-12 DIAGNOSIS — D539 Nutritional anemia, unspecified: Secondary | ICD-10-CM | POA: Diagnosis not present

## 2017-10-12 DIAGNOSIS — R1013 Epigastric pain: Secondary | ICD-10-CM | POA: Diagnosis not present

## 2017-10-12 DIAGNOSIS — D696 Thrombocytopenia, unspecified: Secondary | ICD-10-CM | POA: Diagnosis not present

## 2017-10-12 DIAGNOSIS — I10 Essential (primary) hypertension: Secondary | ICD-10-CM | POA: Diagnosis not present

## 2017-10-12 DIAGNOSIS — K2900 Acute gastritis without bleeding: Secondary | ICD-10-CM | POA: Diagnosis not present

## 2017-10-12 DIAGNOSIS — R42 Dizziness and giddiness: Secondary | ICD-10-CM | POA: Diagnosis not present

## 2017-10-12 DIAGNOSIS — R110 Nausea: Secondary | ICD-10-CM | POA: Diagnosis not present

## 2017-10-12 DIAGNOSIS — D519 Vitamin B12 deficiency anemia, unspecified: Secondary | ICD-10-CM | POA: Diagnosis not present

## 2017-10-12 DIAGNOSIS — R933 Abnormal findings on diagnostic imaging of other parts of digestive tract: Secondary | ICD-10-CM | POA: Diagnosis not present

## 2017-10-12 LAB — CBC
HEMATOCRIT: 28.9 % — AB (ref 39.0–52.0)
HEMOGLOBIN: 9.8 g/dL — AB (ref 13.0–17.0)
MCH: 37.5 pg — AB (ref 26.0–34.0)
MCHC: 33.9 g/dL (ref 30.0–36.0)
MCV: 110.7 fL — ABNORMAL HIGH (ref 78.0–100.0)
Platelets: 179 10*3/uL (ref 150–400)
RBC: 2.61 MIL/uL — ABNORMAL LOW (ref 4.22–5.81)
RDW: 17.4 % — ABNORMAL HIGH (ref 11.5–15.5)
WBC: 3.3 10*3/uL — ABNORMAL LOW (ref 4.0–10.5)

## 2017-10-12 LAB — BASIC METABOLIC PANEL
Anion gap: 5 (ref 5–15)
BUN: 6 mg/dL (ref 6–20)
CHLORIDE: 114 mmol/L — AB (ref 101–111)
CO2: 26 mmol/L (ref 22–32)
CREATININE: 1.3 mg/dL — AB (ref 0.61–1.24)
Calcium: 8.5 mg/dL — ABNORMAL LOW (ref 8.9–10.3)
GFR calc non Af Amer: 51 mL/min — ABNORMAL LOW (ref >60)
GFR, EST AFRICAN AMERICAN: 59 mL/min — AB (ref >60)
Glucose, Bld: 105 mg/dL — ABNORMAL HIGH (ref 65–99)
Potassium: 3.4 mmol/L — ABNORMAL LOW (ref 3.5–5.1)
Sodium: 145 mmol/L (ref 135–145)

## 2017-10-12 MED ORDER — FOLIC ACID 1 MG PO TABS: 1.0000 mg | ORAL_TABLET | Freq: Every day | ORAL | Status: DC

## 2017-10-12 MED ORDER — PANTOPRAZOLE SODIUM 40 MG PO TBEC
40.0000 mg | DELAYED_RELEASE_TABLET | Freq: Two times a day (BID) | ORAL | Status: DC
  Administered 2017-10-12: 40 mg via ORAL
  Filled 2017-10-12: qty 1

## 2017-10-12 MED ORDER — BIS SUBCIT-METRONID-TETRACYC 140-125-125 MG PO CAPS: 3.0000 | ORAL_CAPSULE | Freq: Three times a day (TID) | ORAL | 0 refills | Status: DC

## 2017-10-12 MED ORDER — POTASSIUM CHLORIDE CRYS ER 20 MEQ PO TBCR
40.0000 meq | EXTENDED_RELEASE_TABLET | Freq: Once | ORAL | Status: AC
  Administered 2017-10-12: 40 meq via ORAL
  Filled 2017-10-12: qty 2

## 2017-10-12 MED ORDER — PANTOPRAZOLE SODIUM 40 MG PO TBEC: 40.0000 mg | DELAYED_RELEASE_TABLET | Freq: Two times a day (BID) | ORAL | 0 refills | Status: DC

## 2017-10-12 MED ORDER — VITAMIN B-1 100 MG PO TABS: 100.0000 mg | ORAL_TABLET | Freq: Every day | ORAL | Status: DC

## 2017-10-12 NOTE — Anesthesia Postprocedure Evaluation (Signed)
Anesthesia Post Note  Patient: Mario Smith  Procedure(s) Performed: ESOPHAGOGASTRODUODENOSCOPY (EGD) WITH PROPOFOL (N/A )     Patient location during evaluation: Endoscopy Anesthesia Type: MAC Level of consciousness: awake and alert Pain management: pain level controlled Vital Signs Assessment: post-procedure vital signs reviewed and stable Respiratory status: spontaneous breathing, nonlabored ventilation, respiratory function stable and patient connected to nasal cannula oxygen Cardiovascular status: stable and blood pressure returned to baseline Postop Assessment: no apparent nausea or vomiting Anesthetic complications: no    Last Vitals:  Vitals:   10/12/17 0530 10/12/17 0916  BP: (!) 126/56 139/65  Pulse: 73 80  Resp: 18 18  Temp: 36.8 C 36.8 C  SpO2: 96% 98%    Last Pain:  Vitals:   10/12/17 1056  TempSrc:   PainSc: 0-No pain   Pain Goal:                 Catalina Gravel

## 2017-10-12 NOTE — Progress Notes (Addendum)
Daily Rounding Note  10/12/2017, 10:23 AM  LOS: 0 days   SUBJECTIVE:   Feels a lot better.  No nausea in > 2 days.  Tolerating regular diet Hospitalist readying pt for discharge.   Note + serum H Pylori Ab,  Clo test is pending.    OBJECTIVE:         Vital signs in last 24 hours:    Temp:  [97.7 F (36.5 C)-98.7 F (37.1 C)] 98.3 F (36.8 C) (05/09 0916) Pulse Rate:  [66-83] 80 (05/09 0916) Resp:  [11-18] 18 (05/09 0916) BP: (109-139)/(50-97) 139/65 (05/09 0916) SpO2:  [96 %-100 %] 98 % (05/09 0916) Weight:  [166 lb 7.2 oz (75.5 kg)] 166 lb 7.2 oz (75.5 kg) (05/08 2115) Last BM Date: 10/10/17 Filed Weights   10/10/17 2016 10/11/17 2115  Weight: 166 lb 3.6 oz (75.4 kg) 166 lb 7.2 oz (75.5 kg)   General: looks better   Heart: RRR Chest: clear bil. Abdomen: soft, NT, active BS  Extremities: no CCE Neuro/Psych:  Oriented x 3, slightly HOH  Intake/Output from previous day: 05/08 0701 - 05/09 0700 In: 1897.5 [I.V.:1797.5; IV Piggyback:100] Out: 0   Intake/Output this shift: No intake/output data recorded.  Lab Results: Recent Labs    10/09/17 1254 10/11/17 0458 10/12/17 0416  WBC 4.1 3.1* 3.3*  HGB 10.9* 9.2* 9.8*  HCT 31.2* 27.1* 28.9*  PLT 155 153 179   BMET Recent Labs    10/09/17 1254 10/11/17 0458 10/12/17 0416  NA 143 144 145  K 3.6 3.4* 3.4*  CL 109 112* 114*  CO2 27 25 26   GLUCOSE 94 88 105*  BUN 16 7 6   CREATININE 1.41* 1.23 1.30*  CALCIUM 9.3 8.1* 8.5*   LFT Recent Labs    10/09/17 1254 10/11/17 0458  PROT 6.5 4.7*  ALBUMIN 3.8 2.8*  AST 16 13*  ALT 11* 9*  ALKPHOS 50 37*  BILITOT 0.5 0.7   PT/INR No results for input(s): LABPROT, INR in the last 72 hours. Hepatitis Panel No results for input(s): HEPBSAG, HCVAB, HEPAIGM, HEPBIGM in the last 72 hours.  Studies/Results: Nm Hepato W/eject Fract  Result Date: 10/10/2017 CLINICAL DATA:  Patient with approximately 2 month  history of intermittent but frequent episodes of vertigo, associated with nausea. There or also episodes of vomiting. No reported abdominal pain. EXAM: NUCLEAR MEDICINE HEPATOBILIARY IMAGING WITH GALLBLADDER EF TECHNIQUE: Sequential images of the abdomen were obtained out to 60 minutes following intravenous administration of radiopharmaceutical. After oral ingestion of Ensure, gallbladder ejection fraction was determined. At 60 min, normal ejection fraction is greater than 33%. RADIOPHARMACEUTICALS:  4.7 mCi Tc-34m  Choletec IV COMPARISON:  CT, 10/10/2017. Ultrasound, 10/09/2017. Ultrasound demonstrated gallbladder sludge with borderline wall thickening. FINDINGS: Prompt uptake and biliary excretion of activity by the liver is seen. Gallbladder activity is visualized, consistent with patency of cystic duct. Biliary activity passes into small bowel, consistent with patent common bile duct. Additional imaging was performed following the ingestion of Ensure to promote gallbladder contraction. Calculated gallbladder ejection fraction is 9%. (Normal gallbladder ejection fraction with Ensure is greater than 33%.) IMPRESSION: 1. No evidence of acute cholecystitis. Patent cystic and common bile ducts. Normal appearance of the liver. 2. And normal gallbladder function with a reduced ejection fraction of 9%. Electronically Signed   By: Lajean Manes M.D.   On: 10/10/2017 14:36   Scheduled Meds: . enoxaparin (LOVENOX) injection  40 mg Subcutaneous Q24H  . feeding  supplement  1 Container Oral TID BM  . fluticasone  2 spray Each Nare Daily  . folic acid  1 mg Intravenous Daily  . methimazole  10 mg Oral Once per day on Mon Wed Fri  . mometasone-formoterol  2 puff Inhalation BID  . sodium chloride flush  3 mL Intravenous Q12H  . thiamine injection  100 mg Intravenous Daily   Continuous Infusions: . sodium chloride 50 mL/hr at 10/12/17 0826  . famotidine (PEPCID) IV Stopped (10/12/17 1012)   PRN Meds:.acetaminophen  **OR** acetaminophen, morphine injection, ondansetron (ZOFRAN) IV **OR** promethazine, polyvinyl alcohol  ASSESMENT:   *   Nausea, vomiting.  Improved.  Gastritis on EGD.     *  GB sludge per CT and ultrasound.  Biliary dyskinesia per HIDA.  LFTs not elevated.  Lap chole cancelled given pt improvement and alternative source of H Pylori gastritis for his nausea.    *  Hx adenomatous colon polyp 2011, overdue for surveillance.    PLAN   *   Add Pylera (bismuth, metronidazole, tetracycline) for 10 days to BID PPI for eradication of H Pylori.  Continue Protonix or other PPI 1 po q day thereafter.    *   GI fup with PA Lemmon, 5/30 at 10 AM Keep 11/22/17 colonoscopy appt at [facility].    *  All of above d/w pt and his wife.  Her questions were answered.         Azucena Freed  10/12/2017, 10:23 AM Phone [phone]   Attending physician's note   I have  reviewed the chart . I agree with the Advanced Practitioner's note, impression and recommendations.   Damaris Hippo , MD [phone]

## 2017-10-12 NOTE — Discharge Summary (Signed)
PATIENT DETAILS Name: Mario Smith Age: 77 y.o. Sex: male Date of Birth: 03/10/1941 MRN: [id number]. Admitting Physician: Lady Deutscher, MD SPQ:[id number], Estella Husk, MD  Admit Date: 10/09/2017 Discharge date: 10/12/2017  Recommendations for Outpatient Follow-up:  1. Follow up with PCP in 1-2 weeks 2. Please obtain BMP/CBC in one week 3. Please follow up on the following pending results: EGD biopsy results are pending  Admitted From:  Home  Disposition: [place]: No  Equipment/Devices: None  Discharge Condition: Stable  CODE STATUS: FULL CODE  Diet recommendation:  Heart Healthy  Brief Summary: See H&P, Labs, Consult and Test reports for all details in brief,Patient is a 77 y.o. male with history of hypothyroidism, hypertension, dyslipidemia, intermittent vertigo-admitted for inpatient evaluation of worsening abdominal pain and nausea and vomiting.    Underwent EGD which showed possible pill gastritis-H. pylori IgM was significantly elevated as well-initially plans were to perform cholecystectomy but since endoscopy demonstrated gastritis-this is not being performed for now see below for further details  Brief Hospital Course: Dyspepsia/persistent nausea: Thought to be secondary to pill gastritis-IgM H. pylori is positive as well.  Underwent endoscopy which demonstrated pill gastritis.  Initially thought to have biliary dyskinesia (HIDA scan with EF around 9%)-General surgery was consulted-plans were to pursue cholecystectomy only if EGD did not demonstrate major abnormalities.  Plans are to treat gastritis for now and see how he does.  Spoke with GI Coralee Pesa-, recommendations are to treat as H. pylori with Pylera.  Follow EGD biopsy results.  If he continues to have epigastric pain/dyspeptic symptoms in the future-consider laparoscopic cholecystectomy for biliary dyskinesia.   Hypothyroidism: Continue Tapazole.  Hypokalemia: Replete and  recheck  Vertigo: Probably peripheral vertigo-likely BPPV- denies any vertigo this morning-if reoccurs-can consider vestibular PT for Epley's maneuver.  Vitamin B12 deficiency: Has been initiated on vitamin B12 injections by PCP.  Hemoglobin-drop in hemoglobin is probably secondary to IV fluids rather than acute blood loss.  Hypertension: Currently controlled-resume antihypertensives prior to discharge.  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  Your endoscopy biopsies are currently pending-please follow  Please get a complete blood count and chemistry panel checked by your Primary MD at your next visit, and again as instructed by your Primary MD.  Get Medicines reviewed and adjusted: Please take all your medications with you for your next visit with your Primary MD  Laboratory/radiological data: Please request your Primary MD to go over all hospital tests and procedure/radiological results at the follow up, please ask your Primary MD to get all Hospital records sent to his/her office.  In some cases, they will be blood work, cultures and biopsy results pending at the time of your discharge. Please request that your primary care M.D. follows up on these results.  Also Note the following: If you experience worsening of your admission symptoms, develop shortness of breath, life threatening emergency, suicidal or homicidal thoughts you must seek medical  attention immediately by calling 911 or calling your MD  immediately  if symptoms less severe.  You must read complete instructions/literature along with all the possible adverse reactions/side effects for all the Medicines you take and that have been prescribed to you. Take any new Medicines after you have completely understood and accpet all the possible adverse reactions/side effects.   Do not drive when taking Pain medications or sleeping medications (Benzodaizepines)  Do not take more than prescribed Pain, Sleep and Anxiety Medications. It is not advisable to combine anxiety,sleep and pain medications without talking with your primary care practitioner  Special Instructions: If you have smoked or chewed Tobacco  in the last 2 yrs please stop smoking, stop any regular Alcohol  and or any Recreational drug use.  Wear Seat belts while driving.  Please note: You were cared for by a hospitalist during your hospital stay. Once you are discharged, your primary care physician will handle any further medical issues. Please note that NO REFILLS for any discharge medications will be authorized once you are discharged, as it is imperative that you return to your primary care physician (or establish a relationship with a primary care physician if you do not have one) for your post hospital discharge needs so that they can reassess your need for medications and monitor your lab values.   Increase activity slowly   Complete by:  As directed      Allergies as of 10/12/2017      Reactions   Codeine    Hydrocodone    Meclizine Nausea Only   Niacin And Related Other (See Comments)   unknown   Niaspan [niacin Er] Swelling   Ondansetron Other (See Comments)   Tremors   Phenergan [promethazine Hcl] Other (See Comments)   Tremors   Simvastatin Other (See Comments)   Leg cramps   Sulfa Antibiotics       Medication List    STOP taking these medications   omeprazole 40 MG capsule Commonly known as:  PRILOSEC     TAKE these medications   aspirin 81 MG  EC tablet Take 81 mg by mouth daily.   bismuth-metronidazole-tetracycline 140-125-125 MG capsule Commonly known as:  PYLERA Take 3 capsules by mouth 4 (four) times daily -  before meals and at bedtime.   budesonide-formoterol 80-4.5 MCG/ACT inhaler Commonly known as:  SYMBICORT Inhale 2 puffs into the lungs 2 (two) times daily.   carboxymethylcellulose 0.5 % Soln Commonly known as:  REFRESH PLUS Place 1 drop into both eyes as needed (dry eyes).   CO Q 10 PO Take 2 capsules by mouth daily.   Fish Oil 1000 MG Caps Take 1 capsule by mouth 2 (two) times daily.   fluticasone 50 MCG/ACT nasal spray Commonly known as:  FLONASE USE 2 SPRAYS IN EACH NOSTRIL ONCE DAILY   hydrochlorothiazide 12.5 MG capsule Commonly known as:  MICROZIDE TAKE 1 CAPSULE BY MOUTH ONCE DAILY   meclizine 25 MG tablet Commonly known as:  ANTIVERT TAKE 1 TABLET (25 MG TOTAL) BY MOUTH 3 (THREE) TIMES DAILY AS NEEDED FOR DIZZINESS.   methimazole 5 MG tablet Commonly known as:  TAPAZOLE Take 10 mg by mouth 3 (three) times a week.   metoprolol tartrate 25 MG tablet Commonly known as:  LOPRESSOR Take 1 tablet (25 mg total) by mouth 2 (two) times daily.   mupirocin ointment 2 % Commonly known as:  BACTROBAN Place 1 application into the nose 2 (two) times daily.   pantoprazole 40  MG tablet Commonly known as:  PROTONIX Take 1 tablet (40 mg total) by mouth 2 (two) times daily.   promethazine 25 MG tablet Commonly known as:  PHENERGAN Take 1 tablet (25 mg total) by mouth every 8 (eight) hours as needed for nausea or vomiting.   ROLAIDS ADVANCED PO Take 2-4 tablets by mouth as needed (indigestion).   Selenium 200 MCG Tabs Take 1 tablet by mouth daily.   telmisartan 80 MG tablet Commonly known as:  MICARDIS Take 1 tablet (80 mg total) by mouth daily.   Vitamin D3 2000 units Tabs Take 1 tablet by mouth daily.      Follow-up Information    Chipper Herb, MD Follow up.   Specialty:  Family  Medicine Contact information: [place] [address] [phone]        Levin Erp, [place] Follow up on 11/02/2017.   Specialty:  Gastroenterology Why:  10 AM follow up with GI PA.  this replaces appt set with GI for 5/13.   Contact information: [address] [place] [address] [phone]          Allergies  Allergen Reactions  . Codeine   . Hydrocodone   . Meclizine Nausea Only  . Niacin And Related Other (See Comments)    unknown  . Niaspan [Niacin Er] Swelling  . Ondansetron Other (See Comments)    Tremors   . Phenergan [Promethazine Hcl] Other (See Comments)    Tremors   . Simvastatin Other (See Comments)    Leg cramps  . Sulfa Antibiotics     Consultations:   GI and general surgery  Other Procedures/Studies: US Abdomen Complete  Result Date: 10/09/2017 CLINICAL DATA:  Nausea and diarrhea for 3 months.  Weight loss. EXAM: ABDOMEN ULTRASOUND COMPLETE COMPARISON:  None. FINDINGS: Gallbladder: Borderline gallbladder wall thickening with maximum transverse thickness of 3.8 mm. No pericholecystic fluid. Moderate amount of gallbladder sludge. No sonographic Murphy sign noted by sonographer. Common bile duct: Diameter: 6.3 mm Liver: No focal lesion identified. Within normal limits in parenchymal echogenicity. Two calcified granulomata noted. Portal vein is patent on color Doppler imaging with normal direction of blood flow towards the liver. IVC: No abnormality visualized. Pancreas: Visualized portion unremarkable. Spleen: Size and appearance within normal limits. Right Kidney: Length: 9.9 cm. Echogenicity within normal limits. No mass or hydronephrosis visualized. Left Kidney: Length: 10.5 cm. Echogenicity within normal limits. No mass or hydronephrosis visualized. Abdominal aorta: No aneurysm visualized. Other findings: None. IMPRESSION: Gallbladder sludge with borderline gallbladder wall thickening, which in the appropriate clinical  setting is suggestive of acute cholecystitis. Otherwise no acute abnormalities within the abdomen. These results will be called to the ordering clinician or representative by the Radiologist Assistant, and communication documented in the PACS or zVision Dashboard. Electronically Signed   By: Fidela Salisbury M.D.   On: 10/09/2017 11:02   US Pelvis Limited (transabdominal Only)  Result Date: 10/09/2017 CLINICAL DATA:  Evaluation of urinary bladder. EXAM: LIMITED ULTRASOUND OF PELVIS TECHNIQUE: Limited transabdominal ultrasound examination of the pelvis was performed. COMPARISON:  None. FINDINGS: The urinary bladder is nearly collapsed and therefore poorly evaluated. IMPRESSION: Suboptimal visualization of the urinary bladder due to its collapsed state. No gross abnormalities. Electronically Signed   By: Fidela Salisbury M.D.   On: 10/09/2017 11:04   Ct Abdomen Pelvis W Contrast  Result Date: 10/10/2017 CLINICAL DATA:  The patient states he has not been feeling well for over a month. Abdominal  pain with loss of appetite. EXAM: CT ABDOMEN AND PELVIS WITH CONTRAST TECHNIQUE: Multidetector CT imaging of the abdomen and pelvis was performed using the standard protocol following bolus administration of intravenous contrast. CONTRAST:  149mL OMNIPAQUE IOHEXOL 300 MG/ML  SOLN COMPARISON:  Abdominal ultrasound from 10/09/2017 FINDINGS: Lower chest: The included heart is normal in size without pericardial effusion. No active pulmonary disease. Hepatobiliary: No focal liver abnormality is seen. No gallstones, gallbladder wall thickening, or biliary dilatation. Probable minimal biliary sludge within. The gallbladder is nondistended in appearance. Pancreas: Unremarkable. No pancreatic ductal dilatation or surrounding inflammatory changes. Spleen: Normal in size without focal abnormality. Adrenals/Urinary Tract: Adrenal glands are unremarkable. Kidneys are normal, without renal calculi, focal lesion, or hydronephrosis.  Bladder is unremarkable. Stomach/Bowel: Stomach is within normal limits. Appendix appears normal. No evidence of bowel wall thickening, distention, or inflammatory changes. Vascular/Lymphatic: Moderate aortoiliac atherosclerosis. Patent branch vessels. No aneurysm. No lymphadenopathy. Reproductive: Normal size prostate with central zone calcifications. Other: No ascites or free air. Musculoskeletal: Marked degenerative disc flattening and vacuum disc phenomena from L4 through S1. No pars defects or listhesis. Lower lumbar degenerative facet arthropathy is identified. Bones are demineralized in appearance. Right SI joint osteoarthritis with sclerosis. 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 No acute solid nor hollow visceral organ abnormality. Electronically Signed   By: Ashley Royalty M.D.   On: 10/10/2017 01:43   Nm Hepato W/eject Fract  Result Date: 10/10/2017 CLINICAL DATA:  Patient with approximately 2 month history of intermittent but frequent episodes of vertigo, associated with nausea. There or also episodes of vomiting. No reported abdominal pain. EXAM: NUCLEAR MEDICINE HEPATOBILIARY IMAGING WITH GALLBLADDER EF TECHNIQUE: Sequential images of the abdomen were obtained out to 60 minutes following intravenous administration of radiopharmaceutical. After oral ingestion of Ensure, gallbladder ejection fraction was determined. At 60 min, normal ejection fraction is greater than 33%. RADIOPHARMACEUTICALS:  4.7 mCi Tc-4m  Choletec IV COMPARISON:  CT, 10/10/2017. Ultrasound, 10/09/2017. Ultrasound demonstrated gallbladder sludge with borderline wall thickening. FINDINGS: Prompt uptake and biliary excretion of activity by the liver is seen. Gallbladder activity is visualized, consistent with patency of cystic duct. Biliary activity passes into small bowel, consistent with patent common bile duct. Additional imaging was performed following the ingestion of Ensure to promote gallbladder  contraction. Calculated gallbladder ejection fraction is 9%. (Normal gallbladder ejection fraction with Ensure is greater than 33%.) IMPRESSION: 1. No evidence of acute cholecystitis. Patent cystic and common bile ducts. Normal appearance of the liver. 2. And normal gallbladder function with a reduced ejection fraction of 9%. Electronically Signed   By: Lajean Manes M.D.   On: 10/10/2017 14:36      TODAY-DAY OF DISCHARGE:  Subjective:   Mario Smith today has no headache,no chest abdominal pain,no new weakness tingling or numbness, feels much better wants to go home today.   Objective:   Blood pressure 139/65, pulse 80, temperature 98.3 F (36.8 C), temperature source Oral, resp. rate 18, height 5\' 9"  (1.753 m), weight 75.5 kg (166 lb 7.2 oz), SpO2 98 %.  Intake/Output Summary (Last 24 hours) at 10/12/2017 1046 Last data filed at 10/12/2017 0600 Gross per 24 hour  Intake 1897.5 ml  Output 0 ml  Net 1897.5 ml   Filed Weights   10/10/17 2016 10/11/17 2115  Weight: 75.4 kg (166 lb 3.6 oz) 75.5 kg (166 lb 7.2 oz)    Exam: Awake Alert, Oriented *3, No new F.N deficits, Normal affect [place].AT,PERRAL Supple Neck,No JVD, No cervical lymphadenopathy appriciated.  Symmetrical Chest wall movement, Good air movement bilaterally, CTAB RRR,No Gallops,Rubs or new Murmurs, No Parasternal Heave +ve B.Sounds, Abd Soft, Non tender, No organomegaly appriciated, No rebound -guarding or rigidity. No Cyanosis, Clubbing or edema, No new Rash or bruise   PERTINENT RADIOLOGIC STUDIES: US Abdomen Complete  Result Date: 10/09/2017 CLINICAL DATA:  Nausea and diarrhea for 3 months.  Weight loss. EXAM: ABDOMEN ULTRASOUND COMPLETE COMPARISON:  None. FINDINGS: Gallbladder: Borderline gallbladder wall thickening with maximum transverse thickness of 3.8 mm. No pericholecystic fluid. Moderate amount of gallbladder sludge. No sonographic Murphy sign noted by sonographer. Common bile duct: Diameter: 6.3 mm Liver: No  focal lesion identified. Within normal limits in parenchymal echogenicity. Two calcified granulomata noted. Portal vein is patent on color Doppler imaging with normal direction of blood flow towards the liver. IVC: No abnormality visualized. Pancreas: Visualized portion unremarkable. Spleen: Size and appearance within normal limits. Right Kidney: Length: 9.9 cm. Echogenicity within normal limits. No mass or hydronephrosis visualized. Left Kidney: Length: 10.5 cm. Echogenicity within normal limits. No mass or hydronephrosis visualized. Abdominal aorta: No aneurysm visualized. Other findings: None. IMPRESSION: Gallbladder sludge with borderline gallbladder wall thickening, which in the appropriate clinical setting is suggestive of acute cholecystitis. Otherwise no acute abnormalities within the abdomen. These results will be called to the ordering clinician or representative by the Radiologist Assistant, and communication documented in the PACS or zVision Dashboard. Electronically Signed   By: Fidela Salisbury M.D.   On: 10/09/2017 11:02   US Pelvis Limited (transabdominal Only)  Result Date: 10/09/2017 CLINICAL DATA:  Evaluation of urinary bladder. EXAM: LIMITED ULTRASOUND OF PELVIS TECHNIQUE: Limited transabdominal ultrasound examination of the pelvis was performed. COMPARISON:  None. FINDINGS: The urinary bladder is nearly collapsed and therefore poorly evaluated. IMPRESSION: Suboptimal visualization of the urinary bladder due to its collapsed state. No gross abnormalities. Electronically Signed   By: Fidela Salisbury M.D.   On: 10/09/2017 11:04   Ct Abdomen Pelvis W Contrast  Result Date: 10/10/2017 CLINICAL DATA:  The patient states he has not been feeling well for over a month. Abdominal pain with loss of appetite. EXAM: CT ABDOMEN AND PELVIS WITH CONTRAST TECHNIQUE: Multidetector CT imaging of the abdomen and pelvis was performed using the standard protocol following bolus administration of  intravenous contrast. CONTRAST:  168mL OMNIPAQUE IOHEXOL 300 MG/ML  SOLN COMPARISON:  Abdominal ultrasound from 10/09/2017 FINDINGS: Lower chest: The included heart is normal in size without pericardial effusion. No active pulmonary disease. Hepatobiliary: No focal liver abnormality is seen. No gallstones, gallbladder wall thickening, or biliary dilatation. Probable minimal biliary sludge within. The gallbladder is nondistended in appearance. Pancreas: Unremarkable. No pancreatic ductal dilatation or surrounding inflammatory changes. Spleen: Normal in size without focal abnormality. Adrenals/Urinary Tract: Adrenal glands are unremarkable. Kidneys are normal, without renal calculi, focal lesion, or hydronephrosis. Bladder is unremarkable. Stomach/Bowel: Stomach is within normal limits. Appendix appears normal. No evidence of bowel wall thickening, distention, or inflammatory changes. Vascular/Lymphatic: Moderate aortoiliac atherosclerosis. Patent branch vessels. No aneurysm. No lymphadenopathy. Reproductive: Normal size prostate with central zone calcifications. Other: No ascites or free air. Musculoskeletal: Marked degenerative disc flattening and vacuum disc phenomena from L4 through S1. No pars defects or listhesis. Lower lumbar degenerative facet arthropathy is identified. Bones are demineralized in appearance. Right SI joint osteoarthritis with sclerosis. 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 No acute solid nor hollow visceral organ abnormality. Electronically Signed   By: Ashley Royalty M.D.   On: 10/10/2017 01:43   Nm Hepato  W/eject Fract  Result Date: 10/10/2017 CLINICAL DATA:  Patient with approximately 2 month history of intermittent but frequent episodes of vertigo, associated with nausea. There or also episodes of vomiting. No reported abdominal pain. EXAM: NUCLEAR MEDICINE HEPATOBILIARY IMAGING WITH GALLBLADDER EF TECHNIQUE: Sequential images of the abdomen were obtained out  to 60 minutes following intravenous administration of radiopharmaceutical. After oral ingestion of Ensure, gallbladder ejection fraction was determined. At 60 min, normal ejection fraction is greater than 33%. RADIOPHARMACEUTICALS:  4.7 mCi Tc-31m  Choletec IV COMPARISON:  CT, 10/10/2017. Ultrasound, 10/09/2017. Ultrasound demonstrated gallbladder sludge with borderline wall thickening. FINDINGS: Prompt uptake and biliary excretion of activity by the liver is seen. Gallbladder activity is visualized, consistent with patency of cystic duct. Biliary activity passes into small bowel, consistent with patent common bile duct. Additional imaging was performed following the ingestion of Ensure to promote gallbladder contraction. Calculated gallbladder ejection fraction is 9%. (Normal gallbladder ejection fraction with Ensure is greater than 33%.) IMPRESSION: 1. No evidence of acute cholecystitis. Patent cystic and common bile ducts. Normal appearance of the liver. 2. And normal gallbladder function with a reduced ejection fraction of 9%. Electronically Signed   By: Lajean Manes M.D.   On: 10/10/2017 14:36     PERTINENT LAB RESULTS: CBC: Recent Labs    10/11/17 0458 10/12/17 0416  WBC 3.1* 3.3*  HGB 9.2* 9.8*  HCT 27.1* 28.9*  PLT 153 179   CMET CMP     Component Value Date/Time   NA 145 10/12/2017 0416   NA 141 10/04/2017 1217   K 3.4 (L) 10/12/2017 0416   CL 114 (H) 10/12/2017 0416   CO2 26 10/12/2017 0416   GLUCOSE 105 (H) 10/12/2017 0416   BUN 6 10/12/2017 0416   BUN 16 10/04/2017 1217   CREATININE 1.30 (H) 10/12/2017 0416   CREATININE 1.06 12/12/2012 1057   CALCIUM 8.5 (L) 10/12/2017 0416   PROT 4.7 (L) 10/11/2017 0458   PROT 6.9 07/25/2017 1450   ALBUMIN 2.8 (L) 10/11/2017 0458   ALBUMIN 4.4 07/25/2017 1450   AST 13 (L) 10/11/2017 0458   ALT 9 (L) 10/11/2017 0458   ALKPHOS 37 (L) 10/11/2017 0458   BILITOT 0.7 10/11/2017 0458   BILITOT 0.6 07/25/2017 1450   GFRNONAA 51 (L)  10/12/2017 0416   GFRNONAA 70 12/12/2012 1057   GFRAA 59 (L) 10/12/2017 0416   GFRAA 81 12/12/2012 1057    GFR Estimated Creatinine Clearance: 47.6 mL/min (A) (by C-G formula based on SCr of 1.3 mg/dL (H)). Recent Labs    10/09/17 1254  LIPASE 65*   No results for input(s): CKTOTAL, CKMB, CKMBINDEX, TROPONINI in the last 72 hours. Invalid input(s): POCBNP No results for input(s): DDIMER in the last 72 hours. No results for input(s): HGBA1C in the last 72 hours. No results for input(s): CHOL, HDL, LDLCALC, TRIG, CHOLHDL, LDLDIRECT in the last 72 hours. No results for input(s): TSH, T4TOTAL, T3FREE, THYROIDAB in the last 72 hours.  Invalid input(s): FREET3 No results for input(s): VITAMINB12, FOLATE, FERRITIN, TIBC, IRON, RETICCTPCT in the last 72 hours. Coags: No results for input(s): INR in the last 72 hours.  Invalid input(s): PT Microbiology: Recent Results (from the past 240 hour(s))  Fecal occult blood, imunochemical     Status: None   Collection Time: 10/06/17  5:03 AM  Result Value Ref Range Status   Fecal Occult Bld Negative Negative Final    FURTHER DISCHARGE INSTRUCTIONS:  Get Medicines reviewed and adjusted: Please take all your medications  with you for your next visit with your Primary MD  Laboratory/radiological data: Please request your Primary MD to go over all hospital tests and procedure/radiological results at the follow up, please ask your Primary MD to get all Hospital records sent to his/her office.  In some cases, they will be blood work, cultures and biopsy results pending at the time of your discharge. Please request that your primary care M.D. goes through all the records of your hospital data and follows up on these results.  Also Note the following: If you experience worsening of your admission symptoms, develop shortness of breath, life threatening emergency, suicidal or homicidal thoughts you must seek medical attention immediately by calling  911 or calling your MD immediately  if symptoms less severe.  You must read complete instructions/literature along with all the possible adverse reactions/side effects for all the Medicines you take and that have been prescribed to you. Take any new Medicines after you have completely understood and accpet all the possible adverse reactions/side effects.   Do not drive when taking Pain medications or sleeping medications (Benzodaizepines)  Do not take more than prescribed Pain, Sleep and Anxiety Medications. It is not advisable to combine anxiety,sleep and pain medications without talking with your primary care practitioner  Special Instructions: If you have smoked or chewed Tobacco  in the last 2 yrs please stop smoking, stop any regular Alcohol  and or any Recreational drug use.  Wear Seat belts while driving.  Please note: You were cared for by a hospitalist during your hospital stay. Once you are discharged, your primary care physician will handle any further medical issues. Please note that NO REFILLS for any discharge medications will be authorized once you are discharged, as it is imperative that you return to your primary care physician (or establish a relationship with a primary care physician if you do not have one) for your post hospital discharge needs so that they can reassess your need for medications and monitor your lab values.  Total Time spent coordinating discharge including counseling, education and face to face time equals 35 minutes.  SignedOren Binet 10/12/2017 10:46 AM

## 2017-10-12 NOTE — Progress Notes (Signed)
Patient discharged to home. Patient AVS reviewed and signed. Patient capable re-verbalizing medications and follow-up appointments. IV removed. Patient belongings sent with patient. Patient educated to return to the ED in the event of SOB, chest pain or dizziness.   Jamond Neels, RN 

## 2017-10-13 ENCOUNTER — Encounter (HOSPITAL_COMMUNITY): Payer: Self-pay | Admitting: Gastroenterology

## 2017-10-13 ENCOUNTER — Telehealth: Payer: Self-pay

## 2017-10-13 ENCOUNTER — Ambulatory Visit (INDEPENDENT_AMBULATORY_CARE_PROVIDER_SITE_OTHER): Payer: Medicare HMO | Admitting: *Deleted

## 2017-10-13 DIAGNOSIS — E538 Deficiency of other specified B group vitamins: Secondary | ICD-10-CM | POA: Diagnosis not present

## 2017-10-13 NOTE — Progress Notes (Signed)
BENEFIT CHECK PER CMA:  Memory Argue         # 1.  S/ Shelby Dubin @  [facility] # [id number] OPT- 2   BRAND:  1.BISMUTH- METRONIDAZOLE-TETRACYCLINE  140- 12-125 MG  COVER- NONE FORMULARY  PRIOR APPROVAL- YES # [phone]    GENERIC:  2. PYLERA 140-125-125 MG  COVER- - NONE FORMULARY  PRIOR APPROVAL- YES- [phone]    PREFERRED PHARMACY : YES- [facility]

## 2017-10-13 NOTE — Progress Notes (Signed)
Cyanocobalamin inj given Pt tolerated well

## 2017-10-13 NOTE — Progress Notes (Signed)
BENEFIT CHECK Per CMA: Memory Argue         # 1.  S/ Shelby Dubin @  [facility] # [phone] OPT- 2   BRAND:  1.BISMUTH- METRONIDAZOLE-TETRACYCLINE  140- 12-125 MG  COVER- NONE FORMULARY  PRIOR APPROVAL- YES # [phone]   GENERIC:  2. PYLERA 140-125-125 MG  COVER- - NONE FORMULARY  PRIOR APPROVAL- YES- [phone]   [facility]FERRED PHARMACY : [facility]       NCM notified Dr Tenna Child that brand name is non-formulary, but generic is covered.  Dr. To write new Rx order and fax to pharmacy.

## 2017-10-16 ENCOUNTER — Ambulatory Visit: Payer: Medicare HMO | Admitting: Physician Assistant

## 2017-10-16 ENCOUNTER — Telehealth: Payer: Self-pay | Admitting: Physician Assistant

## 2017-10-16 NOTE — Telephone Encounter (Addendum)
Pt's wife Stanton Kidney called requesting some advice. Pt was at the ED for epigastric pain on 10/12/17, he had an EGD and was put on Pylera. Stanton Kidney stated that pt has not been able to hold down med and vomits every time he takes it. She wants to know if he should go back to the Ed. Mary's phone is [phone]

## 2017-10-16 NOTE — Telephone Encounter (Signed)
Spoke with the spouse at length. She has spelled out the names of the atb Tetracycline and metronidazole which she will d/c. She found Zofran in his medicine bag. She will give him the Zofran with a swallow of gatorade. Wait 30 minutes and if he has not vomited, give him a few more swallows of Gatorade. If she does not have gatorade, she has the name of Pedialyte. She will continue to re-hydrate him slowly. If this does not work and he continues to vomit, she will take him to the ED.

## 2017-10-16 NOTE — Telephone Encounter (Signed)
This pt saw Dr Silverio Decamp in the hospital, did ask to be switched to Dr Hilarie Fredrickson on 07/31/17.  Please advise.  Per Barbera Setters this is a Dr Silverio Decamp pt.

## 2017-10-16 NOTE — Telephone Encounter (Signed)
Phone note reviewed Recent hospitalization reviewed along with the EGD.  Biopsies were negative for H. pylori despite positive antibody This likely indicative of remote infection that does not need treatment given negative biopsies If he cannot maintain oral hydration he needs to return to the ER  Would stop antibiotics.  Can offer Zofran 4 mg which can be used every 6 hours as needed for nausea/vomiting.  The oral disintegrating version may be easier to tolerate. Would try Gatorade or Pedialyte to maintain hydration again if not possible he needs to go to the ER.  If not improving he should go to the ER.  Please see if his appointment with [place] can be moved forward

## 2017-10-16 NOTE — Telephone Encounter (Signed)
He is a [facility] patient. Dr Silverio Decamp 10/11/17. He asked for a specific doctor in February. Spoke with the spouse. The vomiting started this morning 2 hours after the first morning medications. He had eaten breakfast earlier. He became dizzy and started vomiting. She gave him Antivert. He has continued to vomit. She reports he cannot keep anything down. He has vomited more than twice and has become very weak. Please advise.

## 2017-10-17 NOTE — Telephone Encounter (Signed)
Pt's wife calling states she spoke with the nurse yesterday and was told she would be getting a call from [place] today about a sooner appointment. Best call back # [phone].

## 2017-10-17 NOTE — Telephone Encounter (Signed)
Successful in stopping nausea and vomiting. Retaining liquids and simple starches. Remains dizzy. Has Antivert. Continuing Protonix BID until seen. Weak but wife thinks some improvement. She wants him seen sooner. Appointment available tomorrow. Scheduled.

## 2017-10-18 ENCOUNTER — Encounter: Payer: Self-pay | Admitting: Gastroenterology

## 2017-10-18 ENCOUNTER — Ambulatory Visit: Payer: Medicare HMO | Admitting: Gastroenterology

## 2017-10-18 VITALS — BP 122/74 | HR 68 | Ht 66.75 in | Wt 168.1 lb

## 2017-10-18 DIAGNOSIS — R110 Nausea: Secondary | ICD-10-CM | POA: Diagnosis not present

## 2017-10-18 DIAGNOSIS — K219 Gastro-esophageal reflux disease without esophagitis: Secondary | ICD-10-CM

## 2017-10-18 MED ORDER — SUCRALFATE 1 G PO TABS: 1.0000 g | ORAL_TABLET | Freq: Three times a day (TID) | ORAL | 1 refills | Status: DC

## 2017-10-18 NOTE — Progress Notes (Addendum)
10/18/2017 Mario Smith [id number] 12-10-1940   HISTORY OF PRESENT ILLNESS:  This is a 77 year old male who is a patient of Dr. Vena Rua.  He was recently admitted to the hospital with complaints of nausea and vomiting.  He underwent EGD and was found to have gastritis.  H. pylori serology was positive so he was placed on Pylera, but then when gastric biopsies came back negative that was discontinued.  He is currently on pantoprazole 40 mg twice daily.  He had gallbladder evaluated as well and had gallbladder sludge by CT scan and ultrasound.  Had some biliary dyskinesia by HIDA scan.  LFTs are normal.  He is here today with his wife for ongoing complaints of acid reflux and burning upper abdominal pain.  Denies RUQ abdominal pain.  He has had recurrent issues with vertigo, which in turn has caused him to have these issues with nausea and vomiting.  He had an appointment with ENT, but had to cancel it so they agreed to call back and reschedule that.  Had another bout with the vertigo and vomiting Sunday into Monday.     Past Medical History:  Diagnosis Date  . Abdominal pain 10/10/2017  . Back pain   . Colon polyp 2011   tubular adenoma.  no HG dysplasia.    . Dehydration 10/10/2017  . ED (erectile dysfunction)   . Essential hypertension, benign   . Gallbladder sludge 10/2017  . Graves disease   . HTN (hypertension)   . Other and unspecified hyperlipidemia   . PONV (postoperative nausea and vomiting)   . Thrombocytopenia ([place])   . Vertigo    Past Surgical History:  Procedure Laterality Date  . CATARACT EXTRACTION    . COLONOSCOPY  03/2010    Dr Thomasenia Sales, [facility]. Higher risk screening study, sister's hx of colon CA.    Marland Kitchen ESOPHAGOGASTRODUODENOSCOPY (EGD) WITH PROPOFOL N/A 10/11/2017   Procedure: ESOPHAGOGASTRODUODENOSCOPY (EGD) WITH PROPOFOL;  Surgeon: Mauri Pole, MD;  Location: [facility];  Service: Endoscopy;  Laterality: N/A;  . SPINAL FUSION  2001   cervical    . TENDON REPAIR Right 05/16/2014   Procedure: RIGHT RING FINGER FLEXOR TENDON REPAIR WITH PULL OUT BUTTON;  Surgeon: Roseanne Kaufman, MD;  Location: [place];  Service: Orthopedics;  Laterality: Right;    reports that he quit smoking about 24 years ago. His smoking use included cigarettes. He has never used smokeless tobacco. He reports that he does not drink alcohol or use drugs. family history includes Alcohol abuse in his father; COPD in his father and mother; Cancer in his brother and sister; Emphysema in his sister and sister; Healthy in his daughter; Heart disease in his father and mother; Prostate cancer in his brother. Allergies  Allergen Reactions  . Codeine   . Hydrocodone   . Meclizine Nausea Only  . Niacin And Related Other (See Comments)    unknown  . Niaspan [Niacin Er] Swelling  . Ondansetron Other (See Comments)    Tremors   . Phenergan [Promethazine Hcl] Other (See Comments)    Tremors   . Simvastatin Other (See Comments)    Leg cramps  . Sulfa Antibiotics       Outpatient Encounter Medications as of 10/18/2017  Medication Sig  . aspirin 81 MG EC tablet Take 81 mg by mouth daily.    Marland Kitchen bismuth-metronidazole-tetracycline (PYLERA) 140-125-125 MG capsule Take 3 capsules by mouth 4 (four) times daily -  before  meals and at bedtime.  . Cal Carb-Mag Hydrox-Simeth (ROLAIDS ADVANCED PO) Take 2-4 tablets by mouth as needed (indigestion).  . carboxymethylcellulose (REFRESH PLUS) 0.5 % SOLN Place 1 drop into both eyes as needed (dry eyes).  . Cholecalciferol (VITAMIN D3) 2000 UNITS TABS Take 1 tablet by mouth daily.    . Coenzyme Q10 (CO Q 10 PO) Take 2 capsules by mouth daily.   . fluticasone (FLONASE) 50 MCG/ACT nasal spray USE 2 SPRAYS IN EACH NOSTRIL ONCE DAILY  . hydrochlorothiazide (MICROZIDE) 12.5 MG capsule TAKE 1 CAPSULE BY MOUTH ONCE DAILY  . meclizine (ANTIVERT) 25 MG tablet TAKE 1 TABLET (25 MG TOTAL) BY MOUTH 3 (THREE) TIMES DAILY AS NEEDED FOR  DIZZINESS.  . methimazole (TAPAZOLE) 5 MG tablet Take 10 mg by mouth 3 (three) times a week.   . metoprolol tartrate (LOPRESSOR) 25 MG tablet Take 1 tablet (25 mg total) by mouth 2 (two) times daily.  . Omega-3 Fatty Acids (FISH OIL) 1000 MG CAPS Take 1 capsule by mouth 2 (two) times daily.    . pantoprazole (PROTONIX) 40 MG tablet Take 1 tablet (40 mg total) by mouth 2 (two) times daily.  . promethazine (PHENERGAN) 25 MG tablet Take 1 tablet (25 mg total) by mouth every 8 (eight) hours as needed for nausea or vomiting.  . Selenium 200 MCG TABS Take 1 tablet by mouth daily.   Marland Kitchen telmisartan (MICARDIS) 80 MG tablet Take 1 tablet (80 mg total) by mouth daily.  . [DISCONTINUED] budesonide-formoterol (SYMBICORT) 80-4.5 MCG/ACT inhaler Inhale 2 puffs into the lungs 2 (two) times daily.  . [DISCONTINUED] mupirocin ointment (BACTROBAN) 2 % Place 1 application into the nose 2 (two) times daily. (Patient not taking: Reported on 10/09/2017)   Facility-Administered Encounter Medications as of 10/18/2017  Medication  . cyanocobalamin ((VITAMIN B-12)) injection 1,000 mcg     REVIEW OF SYSTEMS  : All other systems reviewed and negative except where noted in the History of Present Illness.   PHYSICAL EXAM: BP 122/74 (BP Location: Left Arm, Patient Position: Sitting, Cuff Size: Normal)   Pulse 68   Ht 5' 6.75" (1.695 m)   Wt 168 lb 2 oz (76.3 kg)   BMI 26.53 kg/m  General: Well developed white male in no acute distress Head: Normocephalic and atraumatic Eyes:  Sclerae anicteric, conjunctivs pink. Ears: Normal auditory acuity Lungs: Clear throughout to auscultation; no increased WOB. Heart: Regular rate and rhythm; no M/R/G. Abdomen: Soft, non-distended.  Normal bowel sounds.  Non-tender. Musculoskeletal: Symmetrical with no gross deformities  Skin: No lesions on visible extremities Extremities: No edema  Neurological: Alert oriented x 4, grossly non-focal Psychological:  Alert and cooperative.  Normal mood and affect  ASSESSMENT AND PLAN: *GERD and burning abdominal pain:  I think that his has all likely been worsened by his recent vomiting, which has been a result of his vertigo issues.  He had an appt with ENT but had to cancel so they will reschedule that.  I am going to leave him on his BID pantoprazole for now as that was just started a couple of weeks ago.  Will add carafate tablet ACHS.  Will have him return in about 3-4 weeks (prior to his already scheduled colonoscopy so that we can assess whether or not he will be able to prep, etc).  ? Gallbladder related.  Had sludge by imaging and dyskinesia on HIDA.  No RUQ abdominal pain.  ? If he needs further neurologic evaluation (had negative CT head W/O  contrast in 03/2017).   CC:  Chipper Herb, MD   Addendum: Reviewed and agree with ongoing management and plans for short interval follow-up in our office. Pyrtle, Lajuan Lines, MD

## 2017-10-18 NOTE — Patient Instructions (Signed)
We have sent the following medications to your pharmacy for you to pick up at your convenience:  Carafate 1 gram with meals and at bedtime.

## 2017-10-21 ENCOUNTER — Ambulatory Visit (INDEPENDENT_AMBULATORY_CARE_PROVIDER_SITE_OTHER): Payer: Medicare HMO

## 2017-10-21 DIAGNOSIS — E538 Deficiency of other specified B group vitamins: Secondary | ICD-10-CM

## 2017-10-21 NOTE — Progress Notes (Signed)
B12 injection given to left deltoid.  Patient tolerated well. 

## 2017-10-23 ENCOUNTER — Encounter: Payer: Self-pay | Admitting: Gastroenterology

## 2017-10-25 ENCOUNTER — Encounter: Payer: Self-pay | Admitting: *Deleted

## 2017-10-27 ENCOUNTER — Ambulatory Visit (INDEPENDENT_AMBULATORY_CARE_PROVIDER_SITE_OTHER): Payer: Medicare HMO | Admitting: *Deleted

## 2017-10-27 DIAGNOSIS — E538 Deficiency of other specified B group vitamins: Secondary | ICD-10-CM | POA: Diagnosis not present

## 2017-10-27 NOTE — Patient Instructions (Signed)
Vitamin B12 Deficiency Vitamin B12 deficiency occurs when the body does not have enough vitamin B12. Vitamin B12 is an important vitamin. The body needs vitamin B12:  To make red blood cells.  To make DNA. This is the genetic material inside cells.  To help the nerves work properly so they can carry messages from the brain to the body.  Vitamin B12 deficiency can cause various health problems, such as a low red blood cell count (anemia) or nerve damage. What are the causes? This condition may be caused by:  Not eating enough foods that contain vitamin B12.  Not having enough stomach acid and digestive fluids to properly absorb vitamin B12 from the food that you eat.  Certain digestive system diseases that make it hard to absorb vitamin B12. These diseases include Crohn disease, chronic pancreatitis, and cystic fibrosis.  Pernicious anemia. This is a condition in which the body does not make enough of a protein (intrinsic factor), resulting in too few red blood cells.  Having a surgery in which part of the stomach or small intestine is removed.  Taking certain medicines that make it hard for the body to absorb vitamin B12. These medicines include: ? Heartburn medicine (antacids and proton pump inhibitors). ? An antibiotic medicine called neomycin. ? Some medicines that are used to treat diabetes, tuberculosis, gout, or high cholesterol.  What increases the risk? The following factors may make you more likely to develop a B12 deficiency:  Being older than age 50.  Eating a vegetarian or vegan diet, especially while you are pregnant.  Eating a poor diet while you are pregnant.  Taking certain drugs.  Having alcoholism.  What are the signs or symptoms? In some cases, there are no symptoms of this condition. If the condition leads to anemia or nerve damage, various symptoms can occur, such as:  Weakness.  Fatigue.  Loss of appetite.  Weight loss.  Numbness or tingling  in your hands and feet.  Redness and burning of the tongue.  Confusion or memory problems.  Depression.  Sensory problems, such as color blindness, ringing in the ears, or loss of taste.  Diarrhea or constipation.  Trouble walking.  If anemia is severe, symptoms can include:  Shortness of breath.  Dizziness.  Rapid heart rate (tachycardia).  How is this diagnosed? This condition may be diagnosed with a blood test to measure the level of vitamin B12 in your blood. You may have other tests to help find the cause of your vitamin B12 deficiency. These tests may include:  A complete blood count (CBC). This is a group of tests that measure certain characteristics of blood cells.  A blood test to measure intrinsic factor.  An endoscopy. In this procedure, a thin tube with a camera on the end is used to look into your stomach or intestines.  How is this treated? Treatment for this condition depends on the cause. Common treatment options include:  Changing your eating and drinking habits, such as: ? Eating more foods that contain vitamin B12. ? Drinking less alcohol or no alcohol.  Taking vitamin B12 supplements. Your health care provider will tell you which dosage is best for you.  Getting vitamin B12 injections.  Follow these instructions at home:  Take supplements only as told by your health care provider. Follow the directions carefully.  Get any injections that are prescribed by your health care provider.  Do not miss your appointments.  Eat lots of healthy foods that contain vitamin B12.   Ask your health care provider if you should work with a dietitian. Foods that contain vitamin B12 include: ? Meat. ? Meat from birds (poultry). ? Fish. ? Eggs. ? Cereal and dairy products that are fortified. This means that vitamin B12 has been added to the food. Check the label on the package to see if the food is fortified.  Do not abuse alcohol.  Keep all follow-up visits as  told by your health care provider. This is important. Contact a health care provider if:  Your symptoms come back. Get help right away if:  You develop shortness of breath.  You have chest pain.  You become dizzy or you lose consciousness. This information is not intended to replace advice given to you by your health care provider. Make sure you discuss any questions you have with your health care provider. Document Released: 08/15/2011 Document Revised: 11/04/2015 Document Reviewed: 10/08/2014 Elsevier Interactive Patient Education  2018 Elsevier Inc.  

## 2017-10-27 NOTE — Progress Notes (Signed)
Vitamin b12 injection given and patient tolerated well. B12 level drawn today

## 2017-10-28 LAB — VITAMIN B12: Vitamin B-12: >2000 pg/mL — ABNORMAL HIGH (ref 232–1245)

## 2017-11-02 ENCOUNTER — Ambulatory Visit: Payer: Medicare HMO | Admitting: Physician Assistant

## 2017-11-03 ENCOUNTER — Ambulatory Visit (INDEPENDENT_AMBULATORY_CARE_PROVIDER_SITE_OTHER): Payer: Medicare HMO | Admitting: *Deleted

## 2017-11-03 DIAGNOSIS — E538 Deficiency of other specified B group vitamins: Secondary | ICD-10-CM

## 2017-11-03 MED ORDER — CYANOCOBALAMIN 1000 MCG/ML IJ SOLN
1000.0000 ug | INTRAMUSCULAR | Status: AC
  Administered 2017-11-03 – 2018-09-17 (×11): 1000 ug via INTRAMUSCULAR

## 2017-11-03 NOTE — Progress Notes (Signed)
Pt given Cyanocobalamin inj Tolerated well 

## 2017-11-04 ENCOUNTER — Other Ambulatory Visit: Payer: Self-pay | Admitting: Nurse Practitioner

## 2017-11-07 ENCOUNTER — Other Ambulatory Visit: Payer: Self-pay | Admitting: Family Medicine

## 2017-11-08 ENCOUNTER — Telehealth: Payer: Self-pay | Admitting: Gastroenterology

## 2017-11-08 MED ORDER — PANTOPRAZOLE SODIUM 40 MG PO TBEC: 40.0000 mg | DELAYED_RELEASE_TABLET | Freq: Two times a day (BID) | ORAL | 3 refills | Status: DC

## 2017-11-08 NOTE — Telephone Encounter (Signed)
Prescription sent and pt aware to continue until next appt.  The pt verbalized understanding

## 2017-11-13 ENCOUNTER — Ambulatory Visit: Payer: Medicare HMO | Admitting: Gastroenterology

## 2017-11-13 DIAGNOSIS — H903 Sensorineural hearing loss, bilateral: Secondary | ICD-10-CM | POA: Insufficient documentation

## 2017-11-13 DIAGNOSIS — R42 Dizziness and giddiness: Secondary | ICD-10-CM | POA: Diagnosis not present

## 2017-11-13 DIAGNOSIS — H90A32 Mixed conductive and sensorineural hearing loss, unilateral, left ear with restricted hearing on the contralateral side: Secondary | ICD-10-CM | POA: Diagnosis not present

## 2017-11-14 ENCOUNTER — Other Ambulatory Visit: Payer: Self-pay | Admitting: Otolaryngology

## 2017-11-14 DIAGNOSIS — R42 Dizziness and giddiness: Secondary | ICD-10-CM

## 2017-11-15 ENCOUNTER — Other Ambulatory Visit (INDEPENDENT_AMBULATORY_CARE_PROVIDER_SITE_OTHER): Payer: Medicare HMO

## 2017-11-15 ENCOUNTER — Encounter: Payer: Self-pay | Admitting: Gastroenterology

## 2017-11-15 ENCOUNTER — Ambulatory Visit: Payer: Medicare HMO | Admitting: Gastroenterology

## 2017-11-15 VITALS — BP 104/62 | HR 51 | Ht 66.75 in | Wt 166.0 lb

## 2017-11-15 DIAGNOSIS — R42 Dizziness and giddiness: Secondary | ICD-10-CM | POA: Diagnosis not present

## 2017-11-15 DIAGNOSIS — K219 Gastro-esophageal reflux disease without esophagitis: Secondary | ICD-10-CM | POA: Diagnosis not present

## 2017-11-15 DIAGNOSIS — D649 Anemia, unspecified: Secondary | ICD-10-CM

## 2017-11-15 DIAGNOSIS — R112 Nausea with vomiting, unspecified: Secondary | ICD-10-CM

## 2017-11-15 LAB — CBC WITH DIFFERENTIAL/PLATELET
BASOS PCT: 1 % (ref 0.0–3.0)
Basophils Absolute: 0.1 10*3/uL (ref 0.0–0.1)
EOS ABS: 0.3 10*3/uL (ref 0.0–0.7)
EOS PCT: 3.9 % (ref 0.0–5.0)
HEMATOCRIT: 42.4 % (ref 39.0–52.0)
Hemoglobin: 14.5 g/dL (ref 13.0–17.0)
LYMPHS PCT: 21.8 % (ref 12.0–46.0)
Lymphs Abs: 1.6 10*3/uL (ref 0.7–4.0)
MCHC: 34.2 g/dL (ref 30.0–36.0)
MCV: 103.2 fl — ABNORMAL HIGH (ref 78.0–100.0)
Monocytes Absolute: 0.7 10*3/uL (ref 0.1–1.0)
Monocytes Relative: 9.4 % (ref 3.0–12.0)
NEUTROS ABS: 4.7 10*3/uL (ref 1.4–7.7)
Neutrophils Relative %: 63.9 % (ref 43.0–77.0)
PLATELETS: 157 10*3/uL (ref 150.0–400.0)
RBC: 4.11 Mil/uL — ABNORMAL LOW (ref 4.22–5.81)
RDW: 15.9 % — AB (ref 11.5–15.5)
WBC: 7.3 10*3/uL (ref 4.0–10.5)

## 2017-11-15 MED ORDER — SUCRALFATE 1 G PO TABS: 1.0000 g | ORAL_TABLET | Freq: Three times a day (TID) | ORAL | 0 refills | Status: DC

## 2017-11-15 NOTE — Progress Notes (Addendum)
11/15/2017 [name] [id number] 03-11-1941   HISTORY OF PRESENT ILLNESS:  This is a 77 year old male who is a patient of Dr. Vena Rua who is here for follow-up regarding his recent issues with nausea and vomiting, GERD and abdominal pain.  See my note from 10/18/2017 for more details.  Anyway, his nausea and vomiting seem to be coming from his issues with vertigo.  That has still not been controlled.  He saw ENT who is recommending and MRI of the head and they are waiting for that to be scheduled.  Otherwise he is taking his BID PPI and his carafate ACHS (which was given to him at his last visit) and feels that the reflux issues and burning abdominal pain are better overall.   Past Medical History:  Diagnosis Date  . Abdominal pain 10/10/2017  . Back pain   . Colon polyp 2011   tubular adenoma.  no HG dysplasia.    . Dehydration 10/10/2017  . ED (erectile dysfunction)   . Essential hypertension, benign   . Gallbladder sludge 10/2017  . Graves disease   . HTN (hypertension)   . Other and unspecified hyperlipidemia   . PONV (postoperative nausea and vomiting)   . Thrombocytopenia ([place])   . Vertigo    Past Surgical History:  Procedure Laterality Date  . CATARACT EXTRACTION    . COLONOSCOPY  03/2010    Dr Thomasenia Sales, [facility]. Higher risk screening study, sister's hx of colon CA.    Mario Smith ESOPHAGOGASTRODUODENOSCOPY (EGD) WITH PROPOFOL N/A 10/11/2017   Procedure: ESOPHAGOGASTRODUODENOSCOPY (EGD) WITH PROPOFOL;  Surgeon: Mauri Pole, MD;  [facility];  Service: Endoscopy;  Laterality: N/A;  . SPINAL FUSION  2001   cervical  . TENDON REPAIR Right 05/16/2014   Procedure: RIGHT RING FINGER FLEXOR TENDON REPAIR WITH PULL OUT BUTTON;  Surgeon: Roseanne Kaufman, MD;  Location: [place];  Service: Orthopedics;  Laterality: Right;    reports that he quit smoking about 24 years ago. His smoking use included cigarettes. He has never used smokeless tobacco.  He reports that he does not drink alcohol or use drugs. family history includes Alcohol abuse in his father; COPD in his father and mother; Cancer in his brother and sister; Emphysema in his sister and sister; Healthy in his daughter; Heart disease in his father and mother; Prostate cancer in his brother. Allergies  Allergen Reactions  . Codeine   . Hydrocodone   . Meclizine Nausea Only  . Niacin And Related Other (See Comments)    unknown  . Niaspan [Niacin Er] Swelling  . Ondansetron Other (See Comments)    Tremors   . Phenergan [Promethazine Hcl] Other (See Comments)    Tremors   . Simvastatin Other (See Comments)    Leg cramps  . Sulfa Antibiotics       Outpatient Encounter Medications as of 11/15/2017  Medication Sig  . aspirin 81 MG EC tablet Take 81 mg by mouth daily.    . carboxymethylcellulose (REFRESH PLUS) 0.5 % SOLN Place 1 drop into both eyes as needed (dry eyes).  . Cholecalciferol (VITAMIN D3) 2000 UNITS TABS Take 1 tablet by mouth daily.    . Coenzyme Q10 (CO Q 10 PO) Take 2 capsules by mouth daily.   . fluticasone (FLONASE) 50 MCG/ACT nasal spray USE 2 SPRAYS IN EACH NOSTRIL ONCE DAILY  . hydrochlorothiazide (MICROZIDE) 12.5 MG capsule TAKE 1 CAPSULE BY MOUTH ONCE DAILY  . meclizine (ANTIVERT)  25 MG tablet TAKE 1 TABLET (25 MG TOTAL) BY MOUTH 3 (THREE) TIMES DAILY AS NEEDED FOR DIZZINESS.  . methimazole (TAPAZOLE) 5 MG tablet Take 10 mg by mouth 3 (three) times a week.   . metoprolol tartrate (LOPRESSOR) 25 MG tablet Take 1 tablet (25 mg total) by mouth 2 (two) times daily.  . Omega-3 Fatty Acids (FISH OIL) 1000 MG CAPS Take 1 capsule by mouth 2 (two) times daily.    . pantoprazole (PROTONIX) 40 MG tablet Take 1 tablet (40 mg total) by mouth 2 (two) times daily.  . promethazine (PHENERGAN) 25 MG tablet Take 1 tablet (25 mg total) by mouth every 8 (eight) hours as needed for nausea or vomiting.  . rosuvastatin (CRESTOR) 10 MG tablet Take 10 mg by mouth daily.  .  Selenium 200 MCG TABS Take 1 tablet by mouth daily.   . sucralfate (CARAFATE) 1 g tablet Take 1 tablet (1 g total) by mouth 4 (four) times daily -  with meals and at bedtime.  Mario Smith telmisartan (MICARDIS) 80 MG tablet TAKE 1 TABLET BY MOUTH EVERY DAY  . [DISCONTINUED] bismuth-metronidazole-tetracycline (PYLERA) 140-125-125 MG capsule Take 3 capsules by mouth 4 (four) times daily -  before meals and at bedtime.  . [DISCONTINUED] Cal Carb-Mag Hydrox-Simeth (ROLAIDS ADVANCED PO) Take 2-4 tablets by mouth as needed (indigestion).   Facility-Administered Encounter Medications as of 11/15/2017  Medication  . cyanocobalamin ((VITAMIN B-12)) injection 1,000 mcg     REVIEW OF SYSTEMS  : All other systems reviewed and negative except where noted in the History of Present Illness.   PHYSICAL EXAM: BP 104/62   Pulse (!) 51   Ht 5' 6.75" (1.695 m)   Wt 166 lb (75.3 kg)   BMI 26.19 kg/m  General: Well developed white male in no acute distress Head: Normocephalic and atraumatic Eyes:  Sclerae anicteric, conjunctiva pink. Ears: Normal auditory acuity Lungs: Clear throughout to auscultation; no increased WOB. Heart: Regular rate and rhythm; no M/R/G. Abdomen: Soft, non-distended.  BS present.  Non-tender. Musculoskeletal: Symmetrical with no gross deformities  Skin: No lesions on visible extremities Extremities: No edema  Neurological: Alert oriented x 4, grossly non-focal Psychological:  Alert and cooperative. Normal mood and affect  ASSESSMENT AND PLAN: *GERD and burning abdominal pain:  Somewhat improved.  Still having issues with the vertigo and I think that all of his GI symptoms had been worsened by his recent vomiting, which has been a result of his vertigo issues.  He saw ENT and they are ordering an MRI of his head.  I am going to leave him on his BID pantoprazole for now.  Will also continue carafate tablet ACHS for another month.  They will call Mario Smith back in about 6 weeks (two weeks after  discontinuing carafate) with an update.  ? Gallbladder related.  Had sludge by imaging and dyskinesia on HIDA.  No RUQ abdominal pain. *Anemia:  Previously macrocytic and thought to Vitamin B12 deficiency.  Will recheck CBC today.  Vitamin B12 levels actually quite high recently.    **Was previously scheduled for colonoscopy but has had to post-pone due to these symptoms so will reschedule in the future when he is feeling better and able to tolerate the prep.  CC:  Chipper Herb, MD  Addendum: Reviewed and agree with ongoing management. Pyrtle, Lajuan Lines, MD

## 2017-11-15 NOTE — Patient Instructions (Addendum)
Your provider has requested that you go to the basement level for lab work before leaving today. Press "B" on the elevator. The lab is located at the first door on the left as you exit the elevator.  Continue Carafate for 4 more weeks.   Call our office back in 6 weeks (ask for Patty) and give [place] an update on your symptoms.

## 2017-11-16 ENCOUNTER — Other Ambulatory Visit (HOSPITAL_COMMUNITY): Payer: Self-pay | Admitting: Otolaryngology

## 2017-11-16 DIAGNOSIS — R42 Dizziness and giddiness: Secondary | ICD-10-CM

## 2017-11-20 ENCOUNTER — Telehealth: Payer: Self-pay | Admitting: Gastroenterology

## 2017-11-20 NOTE — Telephone Encounter (Signed)
Patient's wife called to report that her husband is having constipation.  Patient's daughter reports that he has tried linzess in the past (his daughter's) and helped.  He is requesting a rx.  The Miralax he is taking does not help

## 2017-11-21 MED ORDER — LINACLOTIDE 72 MCG PO CAPS: 72.0000 ug | ORAL_CAPSULE | Freq: Every day | ORAL | 2 refills | Status: DC

## 2017-11-21 NOTE — Telephone Encounter (Signed)
Wife is not sure of the dosage that he tried of his daughters.  Will send in 72 mcg.  Follow up scheduled for 01/31/18 2:45

## 2017-11-21 NOTE — Telephone Encounter (Signed)
Follow up scheduled for 01/31/18 2:45

## 2017-11-21 NOTE — Telephone Encounter (Signed)
Ok to try Linzess.  What dose has he taken before?  Ok to send a script for that dose, one pill daily #30, one refill.  Also, please schedule them for a follow-up to see Dr. Hilarie Fredrickson back in 6-8 weeks.  Thank you,  Jess

## 2017-11-22 ENCOUNTER — Encounter: Payer: Medicare HMO | Admitting: Internal Medicine

## 2017-11-22 ENCOUNTER — Other Ambulatory Visit: Payer: Self-pay | Admitting: Family Medicine

## 2017-11-23 ENCOUNTER — Other Ambulatory Visit (HOSPITAL_COMMUNITY): Payer: Self-pay | Admitting: Otolaryngology

## 2017-11-23 ENCOUNTER — Ambulatory Visit (HOSPITAL_COMMUNITY)
Admission: RE | Admit: 2017-11-23 | Discharge: 2017-11-23 | Disposition: A | Discharge status: Home / Self Care | Payer: Medicare HMO | Source: Ambulatory Visit | Attending: Otolaryngology | Admitting: Otolaryngology

## 2017-11-23 DIAGNOSIS — R42 Dizziness and giddiness: Secondary | ICD-10-CM

## 2017-11-23 DIAGNOSIS — Z135 Encounter for screening for eye and ear disorders: Secondary | ICD-10-CM | POA: Diagnosis not present

## 2017-11-23 DIAGNOSIS — G319 Degenerative disease of nervous system, unspecified: Secondary | ICD-10-CM | POA: Insufficient documentation

## 2017-11-23 DIAGNOSIS — I6782 Cerebral ischemia: Secondary | ICD-10-CM | POA: Insufficient documentation

## 2017-11-23 LAB — POCT I-STAT CREATININE: Creatinine, Ser: 1.3 mg/dL — ABNORMAL HIGH (ref 0.61–1.24)

## 2017-11-23 MED ORDER — GADOBENATE DIMEGLUMINE 529 MG/ML IV SOLN
15.0000 mL | Freq: Once | INTRAVENOUS | Status: AC | PRN
  Administered 2017-11-23: 15 mL via INTRAVENOUS

## 2017-11-27 ENCOUNTER — Other Ambulatory Visit: Payer: Medicare HMO

## 2017-11-30 ENCOUNTER — Other Ambulatory Visit: Payer: Medicare HMO

## 2017-11-30 DIAGNOSIS — E0500 Thyrotoxicosis with diffuse goiter without thyrotoxic crisis or storm: Secondary | ICD-10-CM

## 2017-11-30 DIAGNOSIS — K219 Gastro-esophageal reflux disease without esophagitis: Secondary | ICD-10-CM | POA: Diagnosis not present

## 2017-11-30 DIAGNOSIS — E7800 Pure hypercholesterolemia, unspecified: Secondary | ICD-10-CM

## 2017-11-30 DIAGNOSIS — E559 Vitamin D deficiency, unspecified: Secondary | ICD-10-CM

## 2017-11-30 DIAGNOSIS — E0590 Thyrotoxicosis, unspecified without thyrotoxic crisis or storm: Secondary | ICD-10-CM | POA: Diagnosis not present

## 2017-11-30 DIAGNOSIS — I10 Essential (primary) hypertension: Secondary | ICD-10-CM | POA: Diagnosis not present

## 2017-12-01 LAB — CBC WITH DIFFERENTIAL/PLATELET
BASOS: 1 %
Basophils Absolute: 0.1 10*3/uL (ref 0.0–0.2)
EOS (ABSOLUTE): 0.3 10*3/uL (ref 0.0–0.4)
Eos: 6 %
HEMOGLOBIN: 14.6 g/dL (ref 13.0–17.7)
Hematocrit: 44.4 % (ref 37.5–51.0)
IMMATURE GRANS (ABS): 0 10*3/uL (ref 0.0–0.1)
Immature Granulocytes: 0 %
LYMPHS: 26 %
Lymphocytes Absolute: 1.5 10*3/uL (ref 0.7–3.1)
MCH: 32.5 pg (ref 26.6–33.0)
MCHC: 32.9 g/dL (ref 31.5–35.7)
MCV: 99 fL — AB (ref 79–97)
Monocytes Absolute: 0.6 10*3/uL (ref 0.1–0.9)
Monocytes: 11 %
Neutrophils Absolute: 3.2 10*3/uL (ref 1.4–7.0)
Neutrophils: 56 %
Platelets: 147 10*3/uL — ABNORMAL LOW (ref 150–450)
RBC: 4.49 x10E6/uL (ref 4.14–5.80)
RDW: 12.8 % (ref 12.3–15.4)
WBC: 5.6 10*3/uL (ref 3.4–10.8)

## 2017-12-01 LAB — HEPATIC FUNCTION PANEL
ALT: 11 IU/L (ref 0–44)
AST: 17 IU/L (ref 0–40)
Albumin: 4.2 g/dL (ref 3.5–4.8)
Alkaline Phosphatase: 59 IU/L (ref 39–117)
Bilirubin Total: 0.5 mg/dL (ref 0.0–1.2)
Bilirubin, Direct: 0.14 mg/dL (ref 0.00–0.40)
TOTAL PROTEIN: 6.7 g/dL (ref 6.0–8.5)

## 2017-12-01 LAB — BMP8+EGFR
BUN / CREAT RATIO: 9 — AB (ref 10–24)
BUN: 11 mg/dL (ref 8–27)
CO2: 24 mmol/L (ref 20–29)
CREATININE: 1.25 mg/dL (ref 0.76–1.27)
Calcium: 9.8 mg/dL (ref 8.6–10.2)
Chloride: 105 mmol/L (ref 96–106)
GFR calc non Af Amer: 55 mL/min/{1.73_m2} — ABNORMAL LOW (ref >59)
GFR, EST AFRICAN AMERICAN: 64 mL/min/{1.73_m2} (ref >59)
Glucose: 97 mg/dL (ref 65–99)
Potassium: 3.8 mmol/L (ref 3.5–5.2)
Sodium: 145 mmol/L — ABNORMAL HIGH (ref 134–144)

## 2017-12-01 LAB — THYROID PANEL WITH TSH
FREE THYROXINE INDEX: 2.6 (ref 1.2–4.9)
T3 Uptake Ratio: 29 % (ref 24–39)
T4 TOTAL: 8.9 ug/dL (ref 4.5–12.0)
TSH: 5.87 u[IU]/mL — AB (ref 0.450–4.500)

## 2017-12-01 LAB — LIPID PANEL
Chol/HDL Ratio: 4.6 ratio (ref 0.0–5.0)
Cholesterol, Total: 156 mg/dL (ref 100–199)
HDL: 34 mg/dL — ABNORMAL LOW (ref >39)
LDL CALC: 100 mg/dL — AB (ref 0–99)
Triglycerides: 112 mg/dL (ref 0–149)
VLDL CHOLESTEROL CAL: 22 mg/dL (ref 5–40)

## 2017-12-01 LAB — VITAMIN D 25 HYDROXY (VIT D DEFICIENCY, FRACTURES): VIT D 25 HYDROXY: 63.6 ng/mL (ref 30.0–100.0)

## 2017-12-04 ENCOUNTER — Encounter: Payer: Self-pay | Admitting: Family Medicine

## 2017-12-04 ENCOUNTER — Ambulatory Visit (INDEPENDENT_AMBULATORY_CARE_PROVIDER_SITE_OTHER): Payer: Medicare HMO | Admitting: Family Medicine

## 2017-12-04 VITALS — BP 105/61 | HR 60 | Temp 97.0°F | Ht 66.75 in | Wt 166.0 lb

## 2017-12-04 DIAGNOSIS — E538 Deficiency of other specified B group vitamins: Secondary | ICD-10-CM | POA: Diagnosis not present

## 2017-12-04 DIAGNOSIS — E7800 Pure hypercholesterolemia, unspecified: Secondary | ICD-10-CM

## 2017-12-04 DIAGNOSIS — E559 Vitamin D deficiency, unspecified: Secondary | ICD-10-CM | POA: Diagnosis not present

## 2017-12-04 DIAGNOSIS — H8110 Benign paroxysmal vertigo, unspecified ear: Secondary | ICD-10-CM | POA: Diagnosis not present

## 2017-12-04 DIAGNOSIS — D696 Thrombocytopenia, unspecified: Secondary | ICD-10-CM

## 2017-12-04 DIAGNOSIS — R972 Elevated prostate specific antigen [PSA]: Secondary | ICD-10-CM | POA: Diagnosis not present

## 2017-12-04 DIAGNOSIS — E0590 Thyrotoxicosis, unspecified without thyrotoxic crisis or storm: Secondary | ICD-10-CM | POA: Diagnosis not present

## 2017-12-04 DIAGNOSIS — K219 Gastro-esophageal reflux disease without esophagitis: Secondary | ICD-10-CM | POA: Diagnosis not present

## 2017-12-04 DIAGNOSIS — R110 Nausea: Secondary | ICD-10-CM | POA: Diagnosis not present

## 2017-12-04 DIAGNOSIS — E0500 Thyrotoxicosis with diffuse goiter without thyrotoxic crisis or storm: Secondary | ICD-10-CM

## 2017-12-04 DIAGNOSIS — I10 Essential (primary) hypertension: Secondary | ICD-10-CM

## 2017-12-04 MED ORDER — MECLIZINE HCL 25 MG PO TABS: 25.0000 mg | ORAL_TABLET | Freq: Three times a day (TID) | ORAL | 2 refills | Status: DC | PRN

## 2017-12-04 MED ORDER — SELENIUM 200 MCG PO TABS: 200.0000 ug | ORAL_TABLET | Freq: Every day | ORAL | 3 refills | Status: DC

## 2017-12-04 MED ORDER — TELMISARTAN 80 MG PO TABS: 80.0000 mg | ORAL_TABLET | Freq: Every day | ORAL | 3 refills | Status: DC

## 2017-12-04 MED ORDER — PROMETHAZINE HCL 25 MG PO TABS: 25.0000 mg | ORAL_TABLET | Freq: Three times a day (TID) | ORAL | 3 refills | Status: DC | PRN

## 2017-12-04 MED ORDER — SUCRALFATE 1 G PO TABS: 1.0000 g | ORAL_TABLET | Freq: Three times a day (TID) | ORAL | 3 refills | Status: DC

## 2017-12-04 MED ORDER — METOPROLOL TARTRATE 25 MG PO TABS: 25.0000 mg | ORAL_TABLET | Freq: Two times a day (BID) | ORAL | 3 refills | Status: DC

## 2017-12-04 MED ORDER — PANTOPRAZOLE SODIUM 40 MG PO TBEC: 40.0000 mg | DELAYED_RELEASE_TABLET | Freq: Two times a day (BID) | ORAL | 3 refills | Status: DC

## 2017-12-04 MED ORDER — ROSUVASTATIN CALCIUM 10 MG PO TABS: 10.0000 mg | ORAL_TABLET | Freq: Every day | ORAL | 3 refills | Status: DC

## 2017-12-04 MED ORDER — HYDROCHLOROTHIAZIDE 12.5 MG PO CAPS: 12.5000 mg | ORAL_CAPSULE | Freq: Every day | ORAL | 3 refills | Status: DC

## 2017-12-04 NOTE — Patient Instructions (Addendum)
Medicare Annual Wellness Visit  [facility] and the medical providers at [facility] strive to bring you the best medical care.  In doing so we not only want to address your current medical conditions and concerns but also to detect new conditions early and prevent illness, disease and health-related problems.    Medicare offers a yearly Wellness Visit which allows our clinical staff to assess your need for preventative services including immunizations, lifestyle education, counseling to decrease risk of preventable diseases and screening for fall risk and other medical concerns.    This visit is provided free of charge (no copay) for all Medicare recipients. The clinical pharmacists at [facility] have begun to conduct these Wellness Visits which will also include a thorough review of all your medications.    As you primary medical provider recommend that you make an appointment for your Annual Wellness Visit if you have not done so already this year.  You may set up this appointment before you leave today or you may call back ([phone]) and schedule an appointment.  Please make sure when you call that you mention that you are scheduling your Annual Wellness Visit with the clinical pharmacist so that the appointment may be made for the proper length of time.     Continue current medications. Continue good therapeutic lifestyle changes which include good diet and exercise. Fall precautions discussed with patient. If an FOBT was given today- please return it to our front desk. If you are over 52 years old - you may need Prevnar 14 or the adult Pneumonia vaccine.  **Flu shots are available--- please call and schedule a FLU-CLINIC appointment**  After your visit with [place] today you will receive a survey in the mail or online from [employer] regarding your care with [place]. Please take a moment to fill this out. Your feedback is very  important to [place] as you can help [place] better understand your patient needs as well as improve your experience and satisfaction. WE CARE ABOUT YOU!!!   Follow-up with Dr. Hilarie Fredrickson and get colonoscopy at some point in the future. Follow-up with his PA as they requested Wait for a call from Dr. Nicoletta Dress about any further adjustment of thyroid medicine Drink plenty of water and stay well-hydrated

## 2017-12-04 NOTE — Progress Notes (Signed)
Subjective:    Patient ID: DANIELLE LENTO, male    DOB: Mar 17, 1941, 77 y.o.   MRN: [id number]  HPI Pt here for follow up and management of chronic medical problems which includes hyperthyroid, gerd and hypertension. He is taking medication regularly.  The patient has had a somewhat complicated recent history.  He has had some persistent nausea with vomiting gastroesophageal reflux gastritis B12 deficiency hypertension along with the earlier diagnosed thyrotoxicosis and hyperlipidemia.  He has had lab work done and this will be reviewed with him during the visit today.  His vitamin D is good at 75.6.  The CBC has a normal white blood cell count a good hemoglobin at 14.6 and a platelet count that is slightly decreased at 147,000.  The blood sugar is good at 97 and the creatinine this time was normal where it has been elevated in the past.  The electrolytes are good including potassium except the serum sodium is elevated by one-point.  Cholesterol numbers were traditional lipid testing have an LDL-C cholesterol that is slightly higher than in the past but only at 100.  The good cholesterol remains low as it has been in the past triglycerides are good at 112.  Good news is that this time all of his liver function tests are normal which shows improvement in the total protein and albumin.  The AST and ALT were low and there back to normal.  The TSH is slightly elevated at 5.87.  The remainder of the thyroid tests are normal.  Vitamin D level was good at 63.6.  The patient does have a return appointment to have his hearing evaluated further.  He also has had some gastritis and he has to check back with the gastroenterologist office.  He denies any chest pain or shortness of breath.  Is far as his stomach is concerned he tends to be more constipated and has some ongoing nausea but has not seen any blood in the stool or had any trouble with swallowing.  He is passing his water without problems.  He definitely seems to  be feeling better.  His endocrinologist has been sent a copy of the most recent lab work with his slightly elevated TSH.  Patient says that he was mowing his yard and was doing really well and developed some vertigo after that.  He has had a little bit more trouble with the vertigo since then.     Patient Active Problem List   Diagnosis Date Noted  . Gastroesophageal reflux disease 10/18/2017  . Nausea and vomiting   . Gastritis   . Abdominal pain 10/10/2017  . Dehydration 10/10/2017  . HTN (hypertension) 10/10/2017  . HLD (hyperlipidemia) 10/10/2017  . Vertigo 10/10/2017  . Anemia 10/10/2017  . 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 10/10/2017  . B12 deficiency 10/06/2017  . Graves disease 10/27/2016  . Elevated PSA 10/27/2016  . Thrombocytopenia ([place]) 06/25/2016  . Hyperthyroidism 02/24/2016  . Hyperlipidemia 05/27/2013  . Vitamin D deficiency 05/27/2013  . Hypertension 02/11/2010   Outpatient Encounter Medications as of 12/04/2017  Medication Sig  . aspirin 81 MG EC tablet Take 81 mg by mouth daily.    . carboxymethylcellulose (REFRESH PLUS) 0.5 % SOLN Place 1 drop into both eyes as needed (dry eyes).  . Cholecalciferol (VITAMIN D3) 2000 UNITS TABS Take 1 tablet by mouth daily.    . Coenzyme Q10 (CO Q 10 PO) Take 2 capsules by mouth daily.   . fluticasone (FLONASE) 50 MCG/ACT nasal spray USE 2  SPRAYS IN EACH NOSTRIL ONCE DAILY  . hydrochlorothiazide (MICROZIDE) 12.5 MG capsule TAKE 1 CAPSULE BY MOUTH ONCE DAILY  . linaclotide (LINZESS) 72 MCG capsule Take 1 capsule (72 mcg total) by mouth daily before breakfast.  . meclizine (ANTIVERT) 25 MG tablet TAKE 1 TABLET (25 MG TOTAL) BY MOUTH 3 (THREE) TIMES DAILY AS NEEDED FOR DIZZINESS.  . methimazole (TAPAZOLE) 5 MG tablet Take 10 mg by mouth 3 (three) times a week.   . metoprolol tartrate (LOPRESSOR) 25 MG tablet Take 1 tablet (25 mg total) by mouth 2 (two) times daily.  . Omega-3 Fatty Acids (FISH OIL) 1000 MG CAPS Take 1 capsule by mouth  2 (two) times daily.    . pantoprazole (PROTONIX) 40 MG tablet Take 1 tablet (40 mg total) by mouth 2 (two) times daily.  . promethazine (PHENERGAN) 25 MG tablet Take 1 tablet (25 mg total) by mouth every 8 (eight) hours as needed for nausea or vomiting.  . rosuvastatin (CRESTOR) 10 MG tablet Take 10 mg by mouth daily.  . Selenium 200 MCG TABS Take 1 tablet by mouth daily.   . sucralfate (CARAFATE) 1 g tablet Take 1 tablet (1 g total) by mouth 4 (four) times daily -  with meals and at bedtime.  Marland Kitchen telmisartan (MICARDIS) 80 MG tablet TAKE 1 TABLET BY MOUTH EVERY DAY   Facility-Administered Encounter Medications as of 12/04/2017  Medication  . cyanocobalamin ((VITAMIN B-12)) injection 1,000 mcg     Review of Systems  Constitutional: Negative.   HENT: Negative.   Eyes: Negative.   Respiratory: Negative.   Cardiovascular: Negative.   Gastrointestinal: Positive for nausea.  Endocrine: Negative.   Genitourinary: Negative.   Musculoskeletal: Negative.   Skin: Negative.   Allergic/Immunologic: Negative.   Neurological: Positive for light-headedness.  Hematological: Negative.   Psychiatric/Behavioral: Negative.        Objective:   Physical Exam  Constitutional: He is oriented to person, place, and time. He appears well-developed and well-nourished. No distress.  In better spirits and is certainly feeling better today than he was when I saw him last.  HENT:  Head: Normocephalic and atraumatic.  Right Ear: External ear normal.  Left Ear: External ear normal.  Nose: Nose normal.  Mouth/Throat: Oropharynx is clear and moist. No oropharyngeal exudate.  Eyes: Pupils are equal, round, and reactive to light. Conjunctivae and EOM are normal. Right eye exhibits no discharge. Left eye exhibits no discharge. No scleral icterus.  Continue to follow-up with ophthalmology  Neck: Normal range of motion. Neck supple. No thyromegaly present.  No bruits thyromegaly or anterior cervical adenopathy    Cardiovascular: Normal rate, regular rhythm, normal heart sounds and intact distal pulses.  No murmur heard. The heart is regular today at 72/min  Pulmonary/Chest: Effort normal and breath sounds normal. He has no wheezes. He has no rales. He exhibits no tenderness.  Clear anteriorly and posteriorly and no axillary adenopathy  Abdominal: Soft. Bowel sounds are normal. He exhibits no mass. There is tenderness. There is no guarding.  Slight epigastric tenderness without liver or spleen enlargement.  No bruits no masses and no inguinal adenopathy.  Musculoskeletal: Normal range of motion. He exhibits no edema or tenderness.  Lymphadenopathy:    He has no cervical adenopathy.  Neurological: He is alert and oriented to person, place, and time. He has normal reflexes. No cranial nerve deficit.  Skin: Skin is warm and dry. No rash noted.  Psychiatric: He has a normal mood and affect. His behavior  is normal. Judgment and thought content normal.  Patient's mood affect behavior and judgment appear normal.  Nursing note and vitals reviewed.  BP 105/61 (BP Location: Left Arm)   Pulse 60   Temp (!) 97 F (36.1 C) (Oral)   Ht 5' 6.75" (1.695 m)   Wt 166 lb (75.3 kg)   BMI 26.19 kg/m         Assessment & Plan:  1. Hypertension -The blood pressure is good today at the low end of the normal range.  He will continue with current treatment  2. Graves disease -Follow-up and adjust medication with endocrinology office as planned  3. Vitamin D deficiency -Continue current vitamin D replacement  4. Pure hypercholesterolemia -Continue current cholesterol treatment and with as aggressive therapeutic lifestyle changes as possible  5. Elevated PSA -The most recent PSA test was good at 1.7 and this was in February.  6. Gastroesophageal reflux disease, esophagitis presence not specified -Follow-up with gastroenterology as planned  7. Hyperthyroidism -Follow-up with Dr. Nicoletta Dress as planned  8. B12  deficiency -B12 injection today  9. Nausea -Patient persists with some nausea but it is better than it was several months ago.  This seems to be associated with his vertigo.  10. Thrombocytopenia ([place]) -Platelet count was noted today to be slightly decreased and we will continue to monitor that.  He does not have any bleeding issues.  11. Benign paroxysmal positional vertigo, unspecified laterality -Follow-up with ear nose and throat specialist as planned   Meds ordered this encounter  Medications  . hydrochlorothiazide (MICROZIDE) 12.5 MG capsule    Sig: Take 1 capsule (12.5 mg total) by mouth daily.    Dispense:  90 capsule    Refill:  3  . meclizine (ANTIVERT) 25 MG tablet    Sig: Take 1 tablet (25 mg total) by mouth 3 (three) times daily as needed for dizziness.    Dispense:  30 tablet    Refill:  2  . metoprolol tartrate (LOPRESSOR) 25 MG tablet    Sig: Take 1 tablet (25 mg total) by mouth 2 (two) times daily.    Dispense:  180 tablet    Refill:  3  . pantoprazole (PROTONIX) 40 MG tablet    Sig: Take 1 tablet (40 mg total) by mouth 2 (two) times daily.    Dispense:  180 tablet    Refill:  3  . promethazine (PHENERGAN) 25 MG tablet    Sig: Take 1 tablet (25 mg total) by mouth every 8 (eight) hours as needed for nausea or vomiting.    Dispense:  20 tablet    Refill:  3  . rosuvastatin (CRESTOR) 10 MG tablet    Sig: Take 1 tablet (10 mg total) by mouth daily.    Dispense:  90 tablet    Refill:  3  . selenium 200 MCG TABS tablet    Sig: Take 1 tablet (200 mcg total) by mouth daily.    Dispense:  90 tablet    Refill:  3  . sucralfate (CARAFATE) 1 g tablet    Sig: Take 1 tablet (1 g total) by mouth 4 (four) times daily -  with meals and at bedtime.    Dispense:  120 tablet    Refill:  3  . telmisartan (MICARDIS) 80 MG tablet    Sig: Take 1 tablet (80 mg total) by mouth daily.    Dispense:  90 tablet    Refill:  3   Patient Instructions  Medicare Annual Wellness Visit  [facility] and the medical providers at [facility] strive to bring you the best medical care.  In doing so we not only want to address your current medical conditions and concerns but also to detect new conditions early and prevent illness, disease and health-related problems.    Medicare offers a yearly Wellness Visit which allows our clinical staff to assess your need for preventative services including immunizations, lifestyle education, counseling to decrease risk of preventable diseases and screening for fall risk and other medical concerns.    This visit is provided free of charge (no copay) for all Medicare recipients. The clinical pharmacists at [facility] have begun to conduct these Wellness Visits which will also include a thorough review of all your medications.    As you primary medical provider recommend that you make an appointment for your Annual Wellness Visit if you have not done so already this year.  You may set up this appointment before you leave today or you may call back ([phone]) and schedule an appointment.  Please make sure when you call that you mention that you are scheduling your Annual Wellness Visit with the clinical pharmacist so that the appointment may be made for the proper length of time.     Continue current medications. Continue good therapeutic lifestyle changes which include good diet and exercise. Fall precautions discussed with patient. If an FOBT was given today- please return it to our front desk. If you are over 51 years old - you may need Prevnar 47 or the adult Pneumonia vaccine.  **Flu shots are available--- please call and schedule a FLU-CLINIC appointment**  After your visit with [place] today you will receive a survey in the mail or online from [employer] regarding your care with [place]. Please take a moment to fill this out. Your feedback is very important to [place] as you can  help [place] better understand your patient needs as well as improve your experience and satisfaction. WE CARE ABOUT YOU!!!   Follow-up with Dr. Hilarie Fredrickson and get colonoscopy at some point in the future. Follow-up with his PA as they requested Wait for a call from Dr. Nicoletta Dress about any further adjustment of thyroid medicine Drink plenty of water and stay well-hydrated   Arrie Senate MD

## 2017-12-11 ENCOUNTER — Telehealth: Payer: Self-pay | Admitting: Family Medicine

## 2017-12-11 NOTE — Telephone Encounter (Signed)
Last labs were faxed to Dr Shawn Route office [phone].

## 2017-12-12 DIAGNOSIS — H532 Diplopia: Secondary | ICD-10-CM | POA: Diagnosis not present

## 2017-12-12 DIAGNOSIS — E0500 Thyrotoxicosis with diffuse goiter without thyrotoxic crisis or storm: Secondary | ICD-10-CM | POA: Diagnosis not present

## 2017-12-12 DIAGNOSIS — Z79899 Other long term (current) drug therapy: Secondary | ICD-10-CM | POA: Diagnosis not present

## 2017-12-12 DIAGNOSIS — R42 Dizziness and giddiness: Secondary | ICD-10-CM | POA: Diagnosis not present

## 2017-12-12 DIAGNOSIS — H5022 Vertical strabismus, left eye: Secondary | ICD-10-CM | POA: Diagnosis not present

## 2017-12-12 DIAGNOSIS — Z48810 Encounter for surgical aftercare following surgery on the sense organs: Secondary | ICD-10-CM | POA: Diagnosis not present

## 2018-01-01 ENCOUNTER — Telehealth: Payer: Self-pay | Admitting: Internal Medicine

## 2018-01-01 DIAGNOSIS — R948 Abnormal results of function studies of other organs and systems: Secondary | ICD-10-CM

## 2018-01-01 DIAGNOSIS — R112 Nausea with vomiting, unspecified: Secondary | ICD-10-CM

## 2018-01-01 NOTE — Telephone Encounter (Signed)
Pt calling back after being off carafate for a couple of weeks. Pt states he has a little burning and just feels sick. Reports he has hiccups and cannot get rid of them. States he feels like he did before he went to the hospital.

## 2018-01-02 DIAGNOSIS — H90A32 Mixed conductive and sensorineural hearing loss, unilateral, left ear with restricted hearing on the contralateral side: Secondary | ICD-10-CM | POA: Diagnosis not present

## 2018-01-02 DIAGNOSIS — H903 Sensorineural hearing loss, bilateral: Secondary | ICD-10-CM | POA: Diagnosis not present

## 2018-01-02 DIAGNOSIS — H8102 Meniere's disease, left ear: Secondary | ICD-10-CM | POA: Diagnosis not present

## 2018-01-02 DIAGNOSIS — R42 Dizziness and giddiness: Secondary | ICD-10-CM | POA: Diagnosis not present

## 2018-01-03 ENCOUNTER — Other Ambulatory Visit: Payer: Self-pay | Admitting: *Deleted

## 2018-01-03 DIAGNOSIS — I10 Essential (primary) hypertension: Secondary | ICD-10-CM

## 2018-01-03 MED ORDER — TELMISARTAN 80 MG PO TABS: 40.0000 mg | ORAL_TABLET | Freq: Every day | ORAL | 3 refills | Status: DC

## 2018-01-03 MED ORDER — HYDROCHLOROTHIAZIDE 12.5 MG PO CAPS: 25.0000 mg | ORAL_CAPSULE | Freq: Every day | ORAL | 3 refills | Status: DC

## 2018-01-03 NOTE — Telephone Encounter (Signed)
Pt saw Janett Billow last.

## 2018-01-03 NOTE — Telephone Encounter (Signed)
The pt has been advised and a referral to [facility] has been made.  The pt will return call if he has not heard from that office in 1 week.

## 2018-01-03 NOTE — Telephone Encounter (Signed)
Have him restart carafate as he was taking it previously.  Also, refer to surgeons to discuss gallbladder as possible cause of his symptoms.  His gastritis should not be causing this ongoing issue and he had an abnormal HIDA scan.  Thank you,  Jess

## 2018-01-04 DIAGNOSIS — K828 Other specified diseases of gallbladder: Secondary | ICD-10-CM | POA: Diagnosis not present

## 2018-01-05 ENCOUNTER — Ambulatory Visit (INDEPENDENT_AMBULATORY_CARE_PROVIDER_SITE_OTHER): Payer: Medicare HMO | Admitting: *Deleted

## 2018-01-05 DIAGNOSIS — E538 Deficiency of other specified B group vitamins: Secondary | ICD-10-CM

## 2018-01-05 NOTE — Progress Notes (Signed)
Pt given B12 injection IM right deltoid and tolerated well. 

## 2018-01-08 ENCOUNTER — Telehealth: Payer: Self-pay | Admitting: Family Medicine

## 2018-01-08 NOTE — Telephone Encounter (Signed)
5 days out with med changes - micardis cut from 80 to 40 and HCTZ went from 12.5 to 25.   - he is very weak and tired. BP is reading ok- avg. about 123/76, heart rate running avg. 70. He states that he felt some palpitations yesterday one time.  Worried that electrolytes being off. Will come for labs and BP tomorrow, instead of waiting 2 week.     FYI to [facility] and [facility]

## 2018-01-09 ENCOUNTER — Ambulatory Visit: Payer: Medicare HMO | Admitting: *Deleted

## 2018-01-09 ENCOUNTER — Other Ambulatory Visit: Payer: Medicare HMO

## 2018-01-09 DIAGNOSIS — Z0130 Encounter for examination of blood pressure without abnormal findings: Secondary | ICD-10-CM

## 2018-01-09 DIAGNOSIS — I10 Essential (primary) hypertension: Secondary | ICD-10-CM | POA: Diagnosis not present

## 2018-01-09 NOTE — Telephone Encounter (Signed)
Good to repeat electrolytes early as planned

## 2018-01-09 NOTE — Progress Notes (Signed)
Pt here for BP ck BP 105 65 P 67

## 2018-01-10 ENCOUNTER — Encounter (HOSPITAL_COMMUNITY): Payer: Self-pay

## 2018-01-10 ENCOUNTER — Other Ambulatory Visit: Payer: Self-pay

## 2018-01-10 ENCOUNTER — Encounter (HOSPITAL_COMMUNITY)
Admission: RE | Admit: 2018-01-10 | Discharge: 2018-01-10 | Disposition: A | Discharge status: Home / Self Care | Payer: Medicare HMO | Source: Ambulatory Visit | Attending: General Surgery | Admitting: General Surgery

## 2018-01-10 DIAGNOSIS — J449 Chronic obstructive pulmonary disease, unspecified: Secondary | ICD-10-CM | POA: Insufficient documentation

## 2018-01-10 DIAGNOSIS — Z7951 Long term (current) use of inhaled steroids: Secondary | ICD-10-CM | POA: Insufficient documentation

## 2018-01-10 DIAGNOSIS — Z87442 Personal history of urinary calculi: Secondary | ICD-10-CM | POA: Diagnosis not present

## 2018-01-10 DIAGNOSIS — K828 Other specified diseases of gallbladder: Secondary | ICD-10-CM | POA: Insufficient documentation

## 2018-01-10 DIAGNOSIS — Z9842 Cataract extraction status, left eye: Secondary | ICD-10-CM | POA: Insufficient documentation

## 2018-01-10 DIAGNOSIS — I498 Other specified cardiac arrhythmias: Secondary | ICD-10-CM | POA: Insufficient documentation

## 2018-01-10 DIAGNOSIS — Z79899 Other long term (current) drug therapy: Secondary | ICD-10-CM | POA: Insufficient documentation

## 2018-01-10 DIAGNOSIS — I10 Essential (primary) hypertension: Secondary | ICD-10-CM | POA: Diagnosis not present

## 2018-01-10 DIAGNOSIS — Z01818 Encounter for other preprocedural examination: Secondary | ICD-10-CM | POA: Diagnosis not present

## 2018-01-10 DIAGNOSIS — E0500 Thyrotoxicosis with diffuse goiter without thyrotoxic crisis or storm: Secondary | ICD-10-CM | POA: Diagnosis not present

## 2018-01-10 DIAGNOSIS — Z01812 Encounter for preprocedural laboratory examination: Secondary | ICD-10-CM | POA: Insufficient documentation

## 2018-01-10 DIAGNOSIS — Z7982 Long term (current) use of aspirin: Secondary | ICD-10-CM | POA: Insufficient documentation

## 2018-01-10 DIAGNOSIS — E785 Hyperlipidemia, unspecified: Secondary | ICD-10-CM | POA: Insufficient documentation

## 2018-01-10 DIAGNOSIS — Z9841 Cataract extraction status, right eye: Secondary | ICD-10-CM | POA: Insufficient documentation

## 2018-01-10 HISTORY — DX: Constipation, unspecified: K59.00

## 2018-01-10 HISTORY — DX: Unspecified osteoarthritis, unspecified site: M19.90

## 2018-01-10 HISTORY — DX: Chronic obstructive pulmonary disease, unspecified: J44.9

## 2018-01-10 HISTORY — DX: Gastro-esophageal reflux disease without esophagitis: K21.9

## 2018-01-10 HISTORY — DX: Dyspnea, unspecified: R06.00

## 2018-01-10 HISTORY — DX: Personal history of urinary calculi: Z87.442

## 2018-01-10 LAB — CBC
HEMATOCRIT: 47.5 % (ref 39.0–52.0)
HEMOGLOBIN: 15.7 g/dL (ref 13.0–17.0)
MCH: 31 pg (ref 26.0–34.0)
MCHC: 33.1 g/dL (ref 30.0–36.0)
MCV: 93.9 fL (ref 78.0–100.0)
Platelets: 152 10*3/uL (ref 150–400)
RBC: 5.06 MIL/uL (ref 4.22–5.81)
RDW: 12.9 % (ref 11.5–15.5)
WBC: 6.7 10*3/uL (ref 4.0–10.5)

## 2018-01-10 LAB — BMP8+EGFR
BUN/Creatinine Ratio: 11 (ref 10–24)
BUN: 14 mg/dL (ref 8–27)
CALCIUM: 9.8 mg/dL (ref 8.6–10.2)
CO2: 25 mmol/L (ref 20–29)
Chloride: 102 mmol/L (ref 96–106)
Creatinine, Ser: 1.32 mg/dL — ABNORMAL HIGH (ref 0.76–1.27)
GFR calc non Af Amer: 52 mL/min/{1.73_m2} — ABNORMAL LOW (ref >59)
GFR, EST AFRICAN AMERICAN: 60 mL/min/{1.73_m2} (ref >59)
Glucose: 105 mg/dL — ABNORMAL HIGH (ref 65–99)
POTASSIUM: 4 mmol/L (ref 3.5–5.2)
SODIUM: 140 mmol/L (ref 134–144)

## 2018-01-10 NOTE — Progress Notes (Signed)
Pt denies cardiac history or chest pain. States he has sob with exertion due to having Graves' Disease. Pt states he's very weak and tired for quite a while since being diagnosed with Graves' Disease. He's also had Vertigo recently and it makes even more tired.  Pt's wife states pt used to get N/V after surgery very badly, but he had eye surgery at [facility] in April of this year and she stated that whatever they gave him worked great, he did not have any N/V afterwards. She also stated that when pt had a nerve block for a finger surgery in 2015 she states immediately after the injection went in, pt lost his voice and got very shaky. She states it lasted several days. She does not want pt to have nerve block again.

## 2018-01-10 NOTE — Pre-Procedure Instructions (Addendum)
Mario Smith  01/10/2018    Your procedure is scheduled on Thursday, January 18, 2018 at 7:30 AM.   Report to [facility] Entrance "A" Admitting Office at 5:30 AM.   Call this number if you have problems the morning of surgery: [phone]   Questions prior to day of surgery, please call [phone] between 8 & 4 PM.   Remember:  Do not eat or drink after midnight Wednesday, 01/17/18.  Take these medicines the morning of surgery with A SIP OF WATER: Methimazole (Tapazole), Metoprolol (Lopressor), Pantoprazole (Protonix), Diazepam (Valium) - if needed, Meclizine (Antivert) - if needed, may use Flonase, may use eye drops if needed  Stop Aspirin 7 days prior to surgery unless otherwise instructed. Stop Fish Oil and Herbal medications 7 days prior to surgery. Do not use NSAIDS (Ibuprofen, Aleve, etc) or Aspirin products (Goody's, BC powder, etc) 7 days prior to surgery.    Do not wear jewelry.  Do not wear lotions, powders, cologne or deodorant.  Men may shave face and neck.  Do not bring valuables to the hospital.  [facility] is not responsible for any belongings or valuables.  Contacts, dentures or bridgework may not be worn into surgery.  Leave your suitcase in the car.  After surgery it may be brought to your room.  For patients admitted to the hospital, discharge time will be determined by your treatment team.  Patients discharged the day of surgery will not be allowed to drive home.   [facility] - Preparing for Surgery  Before surgery, you can play an important role.  Because skin is not sterile, your skin needs to be as free of germs as possible.  You can reduce the number of germs on you skin by washing with CHG (chlorahexidine gluconate) soap before surgery.  CHG is an antiseptic cleaner which kills germs and bonds with the skin to continue killing germs even after washing.  Oral Hygiene is also important in reducing the risk of infection.  Remember to brush your teeth  with your regular toothpaste the morning of surgery.  Please DO NOT use if you have an allergy to CHG or antibacterial soaps.  If your skin becomes reddened/irritated stop using the CHG and inform your nurse when you arrive at Short Stay.  Do not shave (including legs and underarms) for at least 48 hours prior to the first CHG shower.  You may shave your face.  Please follow these instructions carefully:   1.  Shower with CHG Soap the night before surgery and the morning of Surgery.  2.  If you choose to wash your hair, wash your hair first as usual with your normal shampoo.  3.  After you shampoo, rinse your hair and body thoroughly to remove the shampoo. 4.  Use CHG as you would any other liquid soap.  You can apply chg directly to the skin and wash gently with a      scrungie or washcloth.           5.  Apply the CHG Soap to your body ONLY FROM THE NECK DOWN.   Do not use on open wounds or open sores. Avoid contact with your eyes, ears, mouth and genitals (private parts).  Wash genitals (private parts) with your normal soap.  6.  Wash thoroughly, paying special attention to the area where your surgery will be performed.  7.  Thoroughly rinse your body with warm water from the neck down.  8.  DO NOT shower/wash with your normal soap after using and rinsing off the CHG Soap.  9.  Pat yourself dry with a clean towel.            10.  Wear clean pajamas.            11.  Place clean sheets on your bed the night of your first shower and do not sleep with pets.  Day of Surgery  Shower as above. Do not apply any lotions/deodorants the morning of surgery.   Please wear clean clothes to the hospital. Remember to brush your teeth with toothpaste.   Please read over the fact sheets that you were given.

## 2018-01-11 NOTE — Progress Notes (Signed)
Anesthesia Chart Review:   Case:  [id number] Date/Time:  01/18/18 0715   Procedure:  LAPAROSCOPIC CHOLECYSTECTOMY WITH INTRAOPERATIVE CHOLANGIOGRAM ERAS PATHWAY (N/A )   Anesthesia type:  General   Pre-op diagnosis:  Biliary dyskinesia   Location:  [facility] ROOM 12 / [facility]   Surgeon:  Jovita Kussmaul, MD      DISCUSSION: - Pt is a 77 year old male with hx HTN, Graves disease, COPD.   - Hospitalized 5/6-10/12/17 for persistent nausea (due to gastritis vs biliary dyskinesia)  - Anesthesia history:  1. Wife reports pt gets N/V after surgery very badly; received  eye surgery at [facility] in April of this year and she stated that whatever they gave him worked great, he did not have any N/V afterwards. Review of records and care everywhere show patient received 4 mg of Zofran and 10 mg of dexamethasone  2. He received a "nerve block" for finger surgery in 2015.  Immediately after the injection went in, pt lost his voice and got very shaky. It lasted several days. Pt does not want nerve block again. Records in Epic show pt received supraclavicular block using marcaine with epi.    VS: BP 134/62   Pulse 67   Temp (!) 36.3 C   Resp 20   Ht 5\' 7"  (1.702 m)   Wt 74.8 kg   SpO2 96%   BMI 25.84 kg/m   PROVIDERS: - PCP is Chipper Herb, MD - Endocrinologist is Dorian Heckle, MD (notes in care everywhere)    LABS:  - CBC acceptable for surgery - BMP 01/09/18 acceptable for surgery  Labs Reviewed  CBC     IMAGES:  CXR 05/03/17: Stable left basilar atelectasis/scarring. No active cardiopulmonary disease.   EKG 01/10/18: NSR with sinus arrhythmia   Past Medical History:  Diagnosis Date  . Abdominal pain 10/10/2017  . Arthritis   . Back pain   . Colon polyp 2011   tubular adenoma.  no HG dysplasia.    . Constipation   . COPD (chronic obstructive pulmonary disease) ([place])   . Dehydration 10/10/2017  . Dyspnea    with exertion  . ED (erectile dysfunction)   . Essential hypertension,  benign   . Gallbladder sludge 10/2017  . GERD (gastroesophageal reflux disease)    acute gastritis  . Graves disease   . History of kidney stones   . HTN (hypertension)   . Other and unspecified hyperlipidemia   . PONV (postoperative nausea and vomiting)    Pt's wife also said that when he had a nerve block in 2015 here, pt had a reaction immediately. States pt got very shaky, lost his voice.,Pt does not want a nerve block again  . Thrombocytopenia ([place])   . Vertigo     Past Surgical History:  Procedure Laterality Date  . CATARACT EXTRACTION Bilateral   . COLONOSCOPY  03/2010    Dr Thomasenia Sales, [facility]. Higher risk screening study, sister's hx of colon CA.    Marland Kitchen ESOPHAGOGASTRODUODENOSCOPY (EGD) WITH PROPOFOL N/A 10/11/2017   Procedure: ESOPHAGOGASTRODUODENOSCOPY (EGD) WITH PROPOFOL;  Surgeon: Mauri Pole, MD;  Location: [facility];  Service: Endoscopy;  Laterality: N/A;  . SPINAL FUSION  2001   cervical  . TENDON REPAIR Right 05/16/2014   Procedure: RIGHT RING FINGER FLEXOR TENDON REPAIR WITH PULL OUT BUTTON;  Surgeon: Roseanne Kaufman, MD;  Location: [facility];  Service: Orthopedics;  Laterality: Right;    MEDICATIONS: . aspirin 81 MG  EC tablet  . carboxymethylcellulose (REFRESH PLUS) 0.5 % SOLN  . Cholecalciferol (VITAMIN D3) 2000 UNITS TABS  . Coenzyme Q10 (CO Q 10) 100 MG CAPS  . diazepam (VALIUM) 2 MG tablet  . fluticasone (FLONASE) 50 MCG/ACT nasal spray  . hydrochlorothiazide (MICROZIDE) 12.5 MG capsule  . linaclotide (LINZESS) 72 MCG capsule  . Magnesium Hydroxide (MILK OF MAGNESIA PO)  . meclizine (ANTIVERT) 25 MG tablet  . methimazole (TAPAZOLE) 5 MG tablet  . metoprolol tartrate (LOPRESSOR) 25 MG tablet  . Omega-3 Fatty Acids (FISH OIL) 1000 MG CAPS  . pantoprazole (PROTONIX) 40 MG tablet  . promethazine (PHENERGAN) 25 MG tablet  . rosuvastatin (CRESTOR) 10 MG tablet  . selenium 200 MCG TABS tablet  . sucralfate (CARAFATE) 1 g tablet  .  telmisartan (MICARDIS) 80 MG tablet   . cyanocobalamin ((VITAMIN B-12)) injection 1,000 mcg    If no changes, I anticipate pt can proceed with surgery as scheduled.   Willeen Cass, FNP-BC [facility]/Anesthesiology Phone: [phone] 01/11/2018 2:07 PM

## 2018-01-15 ENCOUNTER — Ambulatory Visit: Payer: Self-pay | Admitting: General Surgery

## 2018-01-18 ENCOUNTER — Ambulatory Visit (HOSPITAL_COMMUNITY): Payer: Medicare HMO

## 2018-01-18 ENCOUNTER — Ambulatory Visit (HOSPITAL_COMMUNITY): Payer: Medicare HMO | Admitting: Anesthesiology

## 2018-01-18 ENCOUNTER — Encounter (HOSPITAL_COMMUNITY): Payer: Self-pay | Admitting: *Deleted

## 2018-01-18 ENCOUNTER — Ambulatory Visit (HOSPITAL_COMMUNITY)
Admission: RE | Admit: 2018-01-18 | Discharge: 2018-01-18 | Disposition: A | Discharge status: Home / Self Care | Payer: Medicare HMO | Source: Ambulatory Visit | Attending: General Surgery | Admitting: General Surgery

## 2018-01-18 ENCOUNTER — Encounter (HOSPITAL_COMMUNITY)
Admission: RE | Disposition: A | Discharge status: Home / Self Care | Payer: Self-pay | Source: Ambulatory Visit | Attending: General Surgery

## 2018-01-18 ENCOUNTER — Ambulatory Visit (HOSPITAL_COMMUNITY): Payer: Medicare HMO | Admitting: Emergency Medicine

## 2018-01-18 DIAGNOSIS — D649 Anemia, unspecified: Secondary | ICD-10-CM | POA: Diagnosis not present

## 2018-01-18 DIAGNOSIS — Z7982 Long term (current) use of aspirin: Secondary | ICD-10-CM | POA: Insufficient documentation

## 2018-01-18 DIAGNOSIS — Z7951 Long term (current) use of inhaled steroids: Secondary | ICD-10-CM | POA: Diagnosis not present

## 2018-01-18 DIAGNOSIS — K219 Gastro-esophageal reflux disease without esophagitis: Secondary | ICD-10-CM | POA: Diagnosis not present

## 2018-01-18 DIAGNOSIS — I10 Essential (primary) hypertension: Secondary | ICD-10-CM | POA: Insufficient documentation

## 2018-01-18 DIAGNOSIS — Z87891 Personal history of nicotine dependence: Secondary | ICD-10-CM | POA: Insufficient documentation

## 2018-01-18 DIAGNOSIS — Z419 Encounter for procedure for purposes other than remedying health state, unspecified: Secondary | ICD-10-CM

## 2018-01-18 DIAGNOSIS — Z79899 Other long term (current) drug therapy: Secondary | ICD-10-CM | POA: Insufficient documentation

## 2018-01-18 DIAGNOSIS — K828 Other specified diseases of gallbladder: Secondary | ICD-10-CM | POA: Diagnosis not present

## 2018-01-18 DIAGNOSIS — K811 Chronic cholecystitis: Secondary | ICD-10-CM | POA: Diagnosis not present

## 2018-01-18 HISTORY — PX: CHOLECYSTECTOMY: SHX55

## 2018-01-18 SURGERY — LAPAROSCOPIC CHOLECYSTECTOMY WITH INTRAOPERATIVE CHOLANGIOGRAM
Anesthesia: General | Site: Abdomen

## 2018-01-18 MED ORDER — MEPERIDINE HCL 50 MG/ML IJ SOLN: 6.2500 mg | INTRAMUSCULAR | Status: DC | PRN

## 2018-01-18 MED ORDER — ACETAMINOPHEN 500 MG PO TABS
1000.0000 mg | ORAL_TABLET | ORAL | Status: AC
  Administered 2018-01-18: 1000 mg via ORAL
  Filled 2018-01-18: qty 2

## 2018-01-18 MED ORDER — SODIUM CHLORIDE 0.9 % IR SOLN
Status: DC | PRN
  Administered 2018-01-18: 1000 mL

## 2018-01-18 MED ORDER — HYDROMORPHONE HCL 1 MG/ML IJ SOLN
0.2500 mg | INTRAMUSCULAR | Status: DC | PRN
  Administered 2018-01-18 (×2): 0.5 mg via INTRAVENOUS

## 2018-01-18 MED ORDER — TRAMADOL HCL 50 MG PO TABS: 50.0000 mg | ORAL_TABLET | Freq: Four times a day (QID) | ORAL | 0 refills | Status: DC | PRN

## 2018-01-18 MED ORDER — LIDOCAINE 2% (20 MG/ML) 5 ML SYRINGE
INTRAMUSCULAR | Status: DC | PRN
  Administered 2018-01-18: 60 mg via INTRAVENOUS

## 2018-01-18 MED ORDER — DEXAMETHASONE SODIUM PHOSPHATE 10 MG/ML IJ SOLN
INTRAMUSCULAR | Status: DC | PRN
  Administered 2018-01-18: 10 mg via INTRAVENOUS

## 2018-01-18 MED ORDER — MIDAZOLAM HCL 5 MG/5ML IJ SOLN
INTRAMUSCULAR | Status: DC | PRN
  Administered 2018-01-18: 1 mg via INTRAVENOUS

## 2018-01-18 MED ORDER — 0.9 % SODIUM CHLORIDE (POUR BTL) OPTIME
TOPICAL | Status: DC | PRN
  Administered 2018-01-18: 1000 mL

## 2018-01-18 MED ORDER — BUPIVACAINE-EPINEPHRINE 0.25% -1:200000 IJ SOLN
INTRAMUSCULAR | Status: DC | PRN
  Administered 2018-01-18: 17 mL

## 2018-01-18 MED ORDER — BUPIVACAINE-EPINEPHRINE (PF) 0.25% -1:200000 IJ SOLN
INTRAMUSCULAR | Status: AC
  Filled 2018-01-18: qty 30

## 2018-01-18 MED ORDER — PROPOFOL 10 MG/ML IV BOLUS
INTRAVENOUS | Status: AC
  Filled 2018-01-18: qty 20

## 2018-01-18 MED ORDER — GABAPENTIN 300 MG PO CAPS
300.0000 mg | ORAL_CAPSULE | ORAL | Status: AC
  Administered 2018-01-18: 300 mg via ORAL
  Filled 2018-01-18: qty 1

## 2018-01-18 MED ORDER — ONDANSETRON HCL 4 MG/2ML IJ SOLN: 4.0000 mg | Freq: Once | INTRAMUSCULAR | Status: DC | PRN

## 2018-01-18 MED ORDER — FENTANYL CITRATE (PF) 250 MCG/5ML IJ SOLN
INTRAMUSCULAR | Status: DC | PRN
  Administered 2018-01-18: 50 ug via INTRAVENOUS
  Administered 2018-01-18: 100 ug via INTRAVENOUS
  Administered 2018-01-18 (×2): 50 ug via INTRAVENOUS

## 2018-01-18 MED ORDER — ONDANSETRON HCL 4 MG/2ML IJ SOLN
INTRAMUSCULAR | Status: DC | PRN
  Administered 2018-01-18: 4 mg via INTRAVENOUS

## 2018-01-18 MED ORDER — MIDAZOLAM HCL 2 MG/2ML IJ SOLN
INTRAMUSCULAR | Status: AC
  Filled 2018-01-18: qty 2

## 2018-01-18 MED ORDER — CEFAZOLIN SODIUM-DEXTROSE 2-4 GM/100ML-% IV SOLN
2.0000 g | INTRAVENOUS | Status: AC
  Administered 2018-01-18: 2 g via INTRAVENOUS
  Filled 2018-01-18: qty 100

## 2018-01-18 MED ORDER — SCOPOLAMINE 1 MG/3DAYS TD PT72
MEDICATED_PATCH | TRANSDERMAL | Status: AC
  Filled 2018-01-18: qty 1

## 2018-01-18 MED ORDER — GLYCOPYRROLATE 0.2 MG/ML IJ SOLN
INTRAMUSCULAR | Status: DC | PRN
  Administered 2018-01-18: 0.2 mg via INTRAVENOUS

## 2018-01-18 MED ORDER — SODIUM CHLORIDE 0.9 % IV SOLN
INTRAVENOUS | Status: DC | PRN
  Administered 2018-01-18: 10 mL

## 2018-01-18 MED ORDER — HYDROMORPHONE HCL 1 MG/ML IJ SOLN
INTRAMUSCULAR | Status: AC
  Administered 2018-01-18: 0.5 mg via INTRAVENOUS
  Filled 2018-01-18: qty 1

## 2018-01-18 MED ORDER — PHENYLEPHRINE 40 MCG/ML (10ML) SYRINGE FOR IV PUSH (FOR BLOOD PRESSURE SUPPORT)
PREFILLED_SYRINGE | INTRAVENOUS | Status: DC | PRN
  Administered 2018-01-18 (×3): 120 ug via INTRAVENOUS

## 2018-01-18 MED ORDER — FENTANYL CITRATE (PF) 250 MCG/5ML IJ SOLN
INTRAMUSCULAR | Status: AC
  Filled 2018-01-18: qty 5

## 2018-01-18 MED ORDER — PROMETHAZINE HCL 25 MG/ML IJ SOLN
INTRAMUSCULAR | Status: DC | PRN
  Administered 2018-01-18: 6.25 mg via INTRAVENOUS

## 2018-01-18 MED ORDER — CHLORHEXIDINE GLUCONATE CLOTH 2 % EX PADS: 6.0000 | MEDICATED_PAD | Freq: Once | CUTANEOUS | Status: DC

## 2018-01-18 MED ORDER — SCOPOLAMINE 1 MG/3DAYS TD PT72
1.0000 | MEDICATED_PATCH | TRANSDERMAL | Status: DC
  Administered 2018-01-18: 1.5 mg via TRANSDERMAL

## 2018-01-18 MED ORDER — SUGAMMADEX SODIUM 200 MG/2ML IV SOLN
INTRAVENOUS | Status: DC | PRN
  Administered 2018-01-18: 200 mg via INTRAVENOUS

## 2018-01-18 MED ORDER — LACTATED RINGERS IV SOLN
INTRAVENOUS | Status: DC | PRN
  Administered 2018-01-18 (×2): via INTRAVENOUS

## 2018-01-18 MED ORDER — ROCURONIUM BROMIDE 10 MG/ML (PF) SYRINGE
PREFILLED_SYRINGE | INTRAVENOUS | Status: DC | PRN
  Administered 2018-01-18: 60 mg via INTRAVENOUS

## 2018-01-18 MED ORDER — IOPAMIDOL (ISOVUE-300) INJECTION 61%
INTRAVENOUS | Status: AC
  Filled 2018-01-18: qty 50

## 2018-01-18 MED ORDER — PROPOFOL 10 MG/ML IV BOLUS
INTRAVENOUS | Status: DC | PRN
  Administered 2018-01-18: 130 mg via INTRAVENOUS

## 2018-01-18 SURGICAL SUPPLY — 41 items
ADH SKN CLS APL DERMABOND .7 (GAUZE/BANDAGES/DRESSINGS) ×1
APPLIER CLIP 5 13 M/L LIGAMAX5 (MISCELLANEOUS) ×2
APR CLP MED LRG 5 ANG JAW (MISCELLANEOUS) ×1
BAG SPEC RTRVL LRG 6X4 10 (ENDOMECHANICALS) ×1
BLADE CLIPPER SURG (BLADE) ×1 IMPLANT
CANISTER SUCT 3000ML PPV (MISCELLANEOUS) ×2 IMPLANT
CATH REDDICK CHOLANGI 4FR 50CM (CATHETERS) ×2 IMPLANT
CHLORAPREP W/TINT 26ML (MISCELLANEOUS) ×2 IMPLANT
CLIP APPLIE 5 13 M/L LIGAMAX5 (MISCELLANEOUS) ×1 IMPLANT
COVER MAYO STAND STRL (DRAPES) ×2 IMPLANT
COVER SURGICAL LIGHT HANDLE (MISCELLANEOUS) ×2 IMPLANT
DERMABOND ADVANCED (GAUZE/BANDAGES/DRESSINGS) ×1
DERMABOND ADVANCED .7 DNX12 (GAUZE/BANDAGES/DRESSINGS) ×1 IMPLANT
DRAPE C-ARM 42X72 X-RAY (DRAPES) ×2 IMPLANT
ELECT REM PT RETURN 9FT ADLT (ELECTROSURGICAL) ×2
ELECTRODE REM PT RTRN 9FT ADLT (ELECTROSURGICAL) ×1 IMPLANT
GLOVE BIO SURGEON STRL SZ7.5 (GLOVE) ×2 IMPLANT
GLOVE BIOGEL PI IND STRL 6.5 (GLOVE) IMPLANT
GLOVE BIOGEL PI INDICATOR 6.5 (GLOVE) ×1
GLOVE SURG SS PI 6.5 STRL IVOR (GLOVE) ×1 IMPLANT
GOWN STRL NON-REIN LRG LVL3 (GOWN DISPOSABLE) ×1 IMPLANT
GOWN STRL REUS W/ TWL LRG LVL3 (GOWN DISPOSABLE) ×3 IMPLANT
GOWN STRL REUS W/TWL LRG LVL3 (GOWN DISPOSABLE) ×6
IV CATH 14GX2 1/4 (CATHETERS) ×2 IMPLANT
KIT BASIN OR (CUSTOM PROCEDURE TRAY) ×2 IMPLANT
KIT TURNOVER KIT B (KITS) ×2 IMPLANT
NS IRRIG 1000ML POUR BTL (IV SOLUTION) ×2 IMPLANT
PAD ARMBOARD 7.5X6 YLW CONV (MISCELLANEOUS) ×2 IMPLANT
POUCH SPECIMEN RETRIEVAL 10MM (ENDOMECHANICALS) ×2 IMPLANT
SCISSORS LAP 5X35 DISP (ENDOMECHANICALS) ×2 IMPLANT
SET IRRIG TUBING LAPAROSCOPIC (IRRIGATION / IRRIGATOR) ×2 IMPLANT
SLEEVE ENDOPATH XCEL 5M (ENDOMECHANICALS) ×4 IMPLANT
SPECIMEN JAR SMALL (MISCELLANEOUS) ×2 IMPLANT
SUT MNCRL AB 4-0 PS2 18 (SUTURE) ×3 IMPLANT
TOWEL OR 17X24 6PK STRL BLUE (TOWEL DISPOSABLE) ×2 IMPLANT
TOWEL OR 17X26 10 PK STRL BLUE (TOWEL DISPOSABLE) ×2 IMPLANT
TRAY LAPAROSCOPIC MC (CUSTOM PROCEDURE TRAY) ×2 IMPLANT
TROCAR XCEL BLUNT TIP 100MML (ENDOMECHANICALS) ×2 IMPLANT
TROCAR XCEL NON-BLD 5MMX100MML (ENDOMECHANICALS) ×2 IMPLANT
TUBING INSUFFLATION (TUBING) ×2 IMPLANT
WATER STERILE IRR 1000ML POUR (IV SOLUTION) ×2 IMPLANT

## 2018-01-18 NOTE — H&P (Signed)
Mario Smith  Location: [facility] Patient #: [id number] DOB: [birth year] Married / Language: English / Race: White Male   History of Present Illness The patient is a 77 year old male who presents with abdominal pain. We are asked to see the patient in consultation by Dr. Zenovia Jarred to evaluate him for biliary dyskinesia. The patient is a 77 year old white male who is been experiencing upper abdominal pain for several months. He has had some weight loss associated with it. He complains of a general malaise. He has had upper abdominal pain both on the left and right. He underwent an upper endoscopy which showed significant gastritis which she is being treated for. He then underwent an ultrasound which showed sludge in the gallbladder with possible mild gallbladder wall thickening. He then had a HIDA scan that showed a low gallbladder ejection of 9%. He has some occasional nausea and vomiting that comes and goes. He has never had abdominal surgery before.   Allergies Codeine Phosphate *ANALGESICS - OPIOID*  Hydrocodone CP *COUGH/COLD/ALLERGY*  Meclizine HCl *ANTIEMETICS*  Niaspan *ANTIHYPERLIPIDEMICS*  Ondansetron *ANTIEMETICS*  Phenergan *ANTIHISTAMINES*  Simvastatin *ANTIHYPERLIPIDEMICS*  Sulfa 10 *OPHTHALMIC AGENTS*   Medication History DiazePAM (2MG  Tablet, Oral) Active. [facility] Selenium (200MCG Tablet, Oral) Active. Meclizine HCl (25MG  Tablet, Oral) Active. Pantoprazole Sodium (40MG  Tablet DR, Oral) Active. Promethazine HCl (25MG  Tablet, Oral) Active. Rosuvastatin Calcium (10MG  Tablet, Oral) Active. Sucralfate (1GM Tablet, Oral) Active. Fluticasone Propionate (50MCG/ACT Suspension, Nasal) Active. HydroCHLOROthiazide (12.5MG  Capsule, Oral) Active. Telmisartan (80MG  Tablet, Oral) Active. Metoprolol Tartrate (25MG  Tablet, Oral) Active. Cefdinir (300MG  Capsule, Oral) Active. [facility] Bismuth (262MG  Tablet, Oral) Active. MethIMAzole (5MG  Tablet,  Oral) Active. MetroNIDAZOLE (500MG  Tablet, Oral) Active. Mupirocin (2% Ointment, External) Active. Omeprazole (40MG  Capsule DR, Oral) Active.    Review of Systems  General Not Present- Appetite Loss, Chills, Fatigue, Fever, Night Sweats, Weight Gain and Weight Loss. Note: All other systems negative (unless as noted in HPI & included Review of Systems) Skin Not Present- Change in Wart/Mole, Dryness, Hives, Jaundice, New Lesions, Non-Healing Wounds, Rash and Ulcer. HEENT Not Present- Earache, Hearing Loss, Hoarseness, Nose Bleed, Oral Ulcers, Ringing in the Ears, Seasonal Allergies, Sinus Pain, Sore Throat, Visual Disturbances, Wears glasses/contact lenses and Yellow Eyes. Respiratory Not Present- Bloody sputum, Chronic Cough, Difficulty Breathing, Snoring and Wheezing. Breast Not Present- Breast Mass, Breast Pain, Nipple Discharge and Skin Changes. Cardiovascular Not Present- Chest Pain, Difficulty Breathing Lying Down, Leg Cramps, Palpitations, Rapid Heart Rate, Shortness of Breath and Swelling of Extremities. Gastrointestinal Present- Abdominal Pain and Vomiting. Not Present- Bloating, Bloody Stool, Change in Bowel Habits, Chronic diarrhea, Constipation, Difficulty Swallowing, Excessive gas, Gets full quickly at meals, Hemorrhoids, Indigestion, Nausea and Rectal Pain. Male Genitourinary Not Present- Blood in Urine, Change in Urinary Stream, Frequency, Impotence, Nocturia, Painful Urination, Urgency and Urine Leakage. Musculoskeletal Not Present- Back Pain, Joint Pain, Joint Stiffness, Muscle Pain, Muscle Weakness and Swelling of Extremities. Neurological Present- Dizziness. Not Present- Decreased Memory, Fainting, Headaches, Numbness, Seizures, Tingling, Tremor, Trouble walking and Weakness. Psychiatric Not Present- Anxiety, Bipolar, Change in Sleep Pattern, Depression, Fearful and Frequent crying. Endocrine Not Present- Cold Intolerance, Excessive Hunger, Hair Changes, Heat Intolerance and  New Diabetes. Hematology Not Present- Easy Bruising, Excessive bleeding, Gland problems, HIV and Persistent Infections.  Vitals  Weight: 165.38 lb Height: 67in Body Surface Area: 1.87 m Body Mass Index: 25.9 kg/m  Temp.: 97.70F(Oral)  Pulse: 67 (Regular)  BP: 134/80 (Sitting, Left Arm, Standard)       Physical Exam General Mental Status-Alert.  General Appearance-Consistent with stated age. Hydration-Well hydrated. Voice-Normal.  Head and Neck Head-normocephalic, atraumatic with no lesions or palpable masses. Trachea-midline. Thyroid Gland Characteristics - normal size and consistency.  Eye Eyeball - Bilateral-Extraocular movements intact. Sclera/Conjunctiva - Bilateral-No scleral icterus.  Chest and Lung Exam Chest and lung exam reveals -quiet, even and easy respiratory effort with no use of accessory muscles and on auscultation, normal breath sounds, no adventitious sounds and normal vocal resonance. Inspection Chest Wall - Normal. Back - normal.  Cardiovascular Cardiovascular examination reveals -normal heart sounds, regular rate and rhythm with no murmurs and normal pedal pulses bilaterally.  Abdomen Note: The abdomen is soft. There is some mild left upper quadrant pain. There is no palpable mass. He has no surgical scars.   Neurologic Neurologic evaluation reveals -alert and oriented x 3 with no impairment of recent or remote memory. Mental Status-Normal.  Musculoskeletal Normal Exam - Left-Upper Extremity Strength Normal and Lower Extremity Strength Normal. Normal Exam - Right-Upper Extremity Strength Normal and Lower Extremity Strength Normal.  Lymphatic Head & Neck  General Head & Neck Lymphatics: Bilateral - Description - Normal. Axillary  General Axillary Region: Bilateral - Description - Normal. Tenderness - Non Tender. Femoral & Inguinal  Generalized Femoral & Inguinal Lymphatics: Bilateral -  Description - Normal. Tenderness - Non Tender.    Assessment & Plan  BILIARY DYSKINESIA (K82.8) Impression: The patient appears to have biliary dyskinesia with a low ejection fraction of around 9%. Since he is symptomatic from this I think there is a reasonable chance that removing his gallbladder will improve his symptoms. He understands that it is not likely to take all of his symptoms away. I think is a good laparoscopic cholecystectomy candidate. I have discussed with him in detail the risks and benefits of the operation as well as some the technical aspects and he understands and wishes to proceed Current Plans Pt Education - Gallbladder Diseases: discussed with patient and provided information.

## 2018-01-18 NOTE — Interval H&P Note (Signed)
History and Physical Interval Note:  01/18/2018 7:19 AM  Mario Smith  has presented today for surgery, with the diagnosis of Biliary dyskinesia  The various methods of treatment have been discussed with the patient and family. After consideration of risks, benefits and other options for treatment, the patient has consented to  Procedure(Smith): LAPAROSCOPIC CHOLECYSTECTOMY WITH INTRAOPERATIVE CHOLANGIOGRAM ERAS PATHWAY (N/A) as a surgical intervention .  The patient'Smith history has been reviewed, patient examined, no change in status, stable for surgery.  I have reviewed the patient'Smith chart and labs.  Questions were answered to the patient'Smith satisfaction.     TOTH III,Mario Smith

## 2018-01-18 NOTE — Op Note (Signed)
01/18/2018  8:40 AM  PATIENT:  Mario Smith  77 y.o. male  PRE-OPERATIVE DIAGNOSIS:  Biliary dyskinesia  POST-OPERATIVE DIAGNOSIS:  Biliary dyskinesia  PROCEDURE:  Procedure(s): LAPAROSCOPIC CHOLECYSTECTOMY WITH INTRAOPERATIVE CHOLANGIOGRAM ERAS PATHWAY (N/A)  SURGEON:  Surgeon(s) and Role:    * Jovita Kussmaul, MD - Primary  PHYSICIAN ASSISTANT:   ASSISTANTS: none   ANESTHESIA:   local and general  EBL:  minimal   BLOOD ADMINISTERED:none  DRAINS: none   LOCAL MEDICATIONS USED:  MARCAINE     SPECIMEN:  Source of Specimen:  gallbladder  DISPOSITION OF SPECIMEN:  PATHOLOGY  COUNTS:  YES  TOURNIQUET:  * No tourniquets in log *  DICTATION: .Dragon Dictation     Procedure: After informed consent was obtained the patient was brought to the operating room and placed in the supine position on the operating room table. After adequate induction of general anesthesia the patient's abdomen was prepped with ChloraPrep allowed to dry and draped in usual sterile manner. An appropriate timeout was performed. The area below the umbilicus was infiltrated with quarter percent  Marcaine. A small incision was made with a 15 blade knife. The incision was carried down through the subcutaneous tissue bluntly with a hemostat and [employer] retractors. The linea alba was identified. The linea alba was incised with a 15 blade knife and each side was grasped with Coker clamps. The preperitoneal space was then probed with a hemostat until the peritoneum was opened and access was gained to the abdominal cavity. A 0 Vicryl pursestring stitch was placed in the fascia surrounding the opening. A Hassan cannula was then placed through the opening and anchored in place with the previously placed Vicryl purse string stitch. The abdomen was insufflated with carbon dioxide without difficulty. A laparoscope was inserted through the [facility] cannula in the right upper quadrant was inspected. Next the epigastric region  was infiltrated with % Marcaine. A small incision was made with a 15 blade knife. A 5 mm port was placed bluntly through this incision into the abdominal cavity under direct vision. Next 2 sites were chosen laterally on the right side of the abdomen for placement of 5 mm ports. Each of these areas was infiltrated with quarter percent Marcaine. Small stab incisions were made with a 15 blade knife. 5 mm ports were then placed bluntly through these incisions into the abdominal cavity under direct vision without difficulty. A blunt grasper was placed through the lateralmost 5 mm port and used to grasp the dome of the gallbladder and elevated anteriorly and superiorly. Another blunt grasper was placed through the other 5 mm port and used to retract the body and neck of the gallbladder. A dissector was placed through the epigastric port and using the electrocautery the peritoneal reflection at the gallbladder neck was opened. Blunt dissection was then carried out in this area until the gallbladder neck-cystic duct junction was readily identified and a good window was created. A single clip was placed on the gallbladder neck. A small  ductotomy was made just below the clip with laparoscopic scissors. A 14-gauge Angiocath was then placed through the anterior abdominal wall under direct vision. A Reddick cholangiogram catheter was then placed through the Angiocath and flushed. The catheter was then placed in the cystic duct and anchored in place with a clip. A cholangiogram was obtained that showed no filling defects good emptying into the duodenum an adequate length on the cystic duct. The anchoring clip and catheters were then removed from the  patient. 3 clips were placed proximally on the cystic duct and the duct was divided between the 2 sets of clips. Posterior to this the cystic artery was identified and again dissected bluntly in a circumferential manner until a good window  was created. 2 clips were placed  proximally and one distally on the artery and the artery was divided between the 2 sets of clips. Next a laparoscopic hook cautery device was used to separate the gallbladder from the liver bed. Prior to completely detaching the gallbladder from the liver bed the liver bed was inspected and several small bleeding points were coagulated with the electrocautery until the area was completely hemostatic. The gallbladder was then detached the rest of it from the liver bed without difficulty. A laparoscopic bag was inserted through the hassan port. The laparoscope was moved to the epigastric port. The gallbladder was placed within the bag and the bag was sealed.  The bag with the gallbladder was then removed with the [facility] cannula through the infraumbilical port without difficulty. The fascial defect was then closed with the previously placed Vicryl pursestring stitch as well as with another figure-of-eight 0 Vicryl stitch. The liver bed was inspected again and found to be hemostatic. The abdomen was irrigated with copious amounts of saline until the effluent was clear. The ports were then removed under direct vision without difficulty and were found to be hemostatic. The gas was allowed to escape. The skin incisions were all closed with interrupted 4-0 Monocryl subcuticular stitches. Dermabond dressings were applied. The patient tolerated the procedure well. At the end of the case all needle sponge and instrument counts were correct. The patient was then awakened and taken to recovery in stable condition  PLAN OF CARE: Discharge to home after PACU  PATIENT DISPOSITION:  PACU - hemodynamically stable.   Delay start of Pharmacological VTE agent (>24hrs) due to surgical blood loss or risk of bleeding: not applicable

## 2018-01-18 NOTE — Anesthesia Preprocedure Evaluation (Signed)
Anesthesia Evaluation  Patient identified by MRN, date of birth, ID band Patient awake    Reviewed: Allergy & Precautions, NPO status , Patient's Chart, lab work & pertinent test results  History of Anesthesia Complications (+) PONV  Airway Mallampati: I  TM Distance: >3 FB Neck ROM: Full    Dental   Pulmonary former smoker,    Pulmonary exam normal        Cardiovascular hypertension, Pt. on medications Normal cardiovascular exam     Neuro/Psych    GI/Hepatic GERD  Medicated and Controlled,  Endo/Other    Renal/GU      Musculoskeletal   Abdominal   Peds  Hematology   Anesthesia Other Findings   Reproductive/Obstetrics                             Anesthesia Physical Anesthesia Plan  ASA: II  Anesthesia Plan: General   Post-op Pain Management:    Induction: Intravenous  PONV Risk Score and Plan: 2 and Ondansetron, Dexamethasone, Scopolamine patch - Pre-op and Diphenhydramine  Airway Management Planned: Oral ETT  Additional Equipment:   Intra-op Plan:   Post-operative Plan: Extubation in OR  Informed Consent: I have reviewed the patients History and Physical, chart, labs and discussed the procedure including the risks, benefits and alternatives for the proposed anesthesia with the patient or authorized representative who has indicated his/her understanding and acceptance.     Plan Discussed with: CRNA and Surgeon  Anesthesia Plan Comments:         Anesthesia Quick Evaluation

## 2018-01-18 NOTE — Anesthesia Postprocedure Evaluation (Signed)
Anesthesia Post Note  Patient: Mario Smith  Procedure(s) Performed: LAPAROSCOPIC CHOLECYSTECTOMY WITH INTRAOPERATIVE CHOLANGIOGRAM ERAS PATHWAY (N/A Abdomen)     Patient location during evaluation: PACU Anesthesia Type: General Level of consciousness: awake and alert Pain management: pain level controlled Vital Signs Assessment: post-procedure vital signs reviewed and stable Respiratory status: spontaneous breathing, nonlabored ventilation, respiratory function stable and patient connected to nasal cannula oxygen Cardiovascular status: blood pressure returned to baseline and stable Postop Assessment: no apparent nausea or vomiting Anesthetic complications: no    Last Vitals:  Vitals:   01/18/18 0935 01/18/18 1015  BP: 120/76 102/77  Pulse: 89   Resp: 11   Temp:    SpO2: 100%     Last Pain:  Vitals:   01/18/18 0955  PainSc: 2                  Maxx Calaway DAVID

## 2018-01-18 NOTE — Anesthesia Procedure Notes (Signed)
Procedure Name: Intubation Date/Time: 01/18/2018 7:51 AM Performed by: Mariea Clonts, CRNA Pre-anesthesia Checklist: Patient identified, Emergency Drugs available, Suction available and Patient being monitored Patient Re-evaluated:Patient Re-evaluated prior to induction Oxygen Delivery Method: Circle System Utilized Preoxygenation: Pre-oxygenation with 100% oxygen Induction Type: IV induction Ventilation: Mask ventilation without difficulty Laryngoscope Size: Miller and 2 Grade View: Grade I Tube type: Oral Tube size: 7.5 mm Number of attempts: 1 Airway Equipment and Method: Stylet and Oral airway Placement Confirmation: ETT inserted through vocal cords under direct vision,  positive ETCO2 and breath sounds checked- equal and bilateral Tube secured with: Tape Dental Injury: Teeth and Oropharynx as per pre-operative assessment

## 2018-01-18 NOTE — Transfer of Care (Signed)
Immediate Anesthesia Transfer of Care Note  Patient: Mario Smith  Procedure(s) Performed: LAPAROSCOPIC CHOLECYSTECTOMY WITH INTRAOPERATIVE CHOLANGIOGRAM ERAS PATHWAY (N/A Abdomen)  Patient Location: PACU  Anesthesia Type:General  Level of Consciousness: awake, alert  and oriented  Airway & Oxygen Therapy: Patient Spontanous Breathing and Patient connected to nasal cannula oxygen  Post-op Assessment: Report given to RN and Post -op Vital signs reviewed and stable  Post vital signs: Reviewed and stable  Last Vitals:  Vitals Value Taken Time  BP 128/63 01/18/2018  8:56 AM  Temp    Pulse 91 01/18/2018  8:59 AM  Resp 14 01/18/2018  8:59 AM  SpO2 100 % 01/18/2018  8:59 AM  Vitals shown include unvalidated device data.  Last Pain:  Vitals:   01/18/18 0657  PainSc: 0-No pain         Complications: No apparent anesthesia complications

## 2018-01-19 ENCOUNTER — Encounter (HOSPITAL_COMMUNITY): Payer: Self-pay | Admitting: General Surgery

## 2018-01-19 ENCOUNTER — Telehealth: Payer: Self-pay | Admitting: Family Medicine

## 2018-01-19 ENCOUNTER — Other Ambulatory Visit: Payer: Self-pay

## 2018-01-19 MED ORDER — METOPROLOL TARTRATE 25 MG PO TABS: 25.0000 mg | ORAL_TABLET | Freq: Two times a day (BID) | ORAL | 0 refills | Status: DC

## 2018-01-19 NOTE — Telephone Encounter (Signed)
What is the name of the medication? Metoprolol 25 mg, Methimazole 5mg  Patient has a few left but no RX for them  Have you contacted your pharmacy to request a refill? Yes  Which pharmacy would you like this sent to? [facility] [place]   Patient notified that their request is being sent to the clinical staff for review and that they should receive a call once it is complete. If they do not receive a call within 24 hours they can check with their pharmacy or our office.

## 2018-01-30 DIAGNOSIS — H8102 Meniere's disease, left ear: Secondary | ICD-10-CM | POA: Diagnosis not present

## 2018-01-30 DIAGNOSIS — H903 Sensorineural hearing loss, bilateral: Secondary | ICD-10-CM | POA: Diagnosis not present

## 2018-01-31 ENCOUNTER — Ambulatory Visit: Payer: Medicare HMO | Admitting: Internal Medicine

## 2018-01-31 ENCOUNTER — Encounter

## 2018-02-06 ENCOUNTER — Ambulatory Visit (INDEPENDENT_AMBULATORY_CARE_PROVIDER_SITE_OTHER): Payer: Medicare HMO

## 2018-02-06 DIAGNOSIS — E538 Deficiency of other specified B group vitamins: Secondary | ICD-10-CM | POA: Diagnosis not present

## 2018-02-06 NOTE — Progress Notes (Signed)
B12 injection to left deltoid, patient tolerated well

## 2018-02-06 NOTE — Patient Instructions (Signed)
Cyanocobalamin, Vitamin B12 injection What is this medicine? CYANOCOBALAMIN (sye an oh koe BAL a min) is a man made form of vitamin B12. Vitamin B12 is used in the growth of healthy blood cells, nerve cells, and proteins in the body. It also helps with the metabolism of fats and carbohydrates. This medicine is used to treat people who can not absorb vitamin B12. This medicine may be used for other purposes; ask your health care provider or pharmacist if you have questions. COMMON BRAND NAME(S): B-12 Compliance Kit, B-12 Injection Kit, Cyomin, LA-12, Nutri-Twelve, Physicians EZ Use B-12, Primabalt What should I tell my health care provider before I take this medicine? They need to know if you have any of these conditions: -kidney disease -Leber's disease -megaloblastic anemia -an unusual or allergic reaction to cyanocobalamin, cobalt, other medicines, foods, dyes, or preservatives -pregnant or trying to get pregnant -breast-feeding How should I use this medicine? This medicine is injected into a muscle or deeply under the skin. It is usually given by a health care professional in a clinic or doctor's office. However, your doctor may teach you how to inject yourself. Follow all instructions. Talk to your pediatrician regarding the use of this medicine in children. Special care may be needed. Overdosage: If you think you have taken too much of this medicine contact a poison control center or emergency room at once. NOTE: This medicine is only for you. Do not share this medicine with others. What if I miss a dose? If you are given your dose at a clinic or doctor's office, call to reschedule your appointment. If you give your own injections and you miss a dose, take it as soon as you can. If it is almost time for your next dose, take only that dose. Do not take double or extra doses. What may interact with this medicine? -colchicine -heavy alcohol intake This list may not describe all possible  interactions. Give your health care provider a list of all the medicines, herbs, non-prescription drugs, or dietary supplements you use. Also tell them if you smoke, drink alcohol, or use illegal drugs. Some items may interact with your medicine. What should I watch for while using this medicine? Visit your doctor or health care professional regularly. You may need blood work done while you are taking this medicine. You may need to follow a special diet. Talk to your doctor. Limit your alcohol intake and avoid smoking to get the best benefit. What side effects may I notice from receiving this medicine? Side effects that you should report to your doctor or health care professional as soon as possible: -allergic reactions like skin rash, itching or hives, swelling of the face, lips, or tongue -blue tint to skin -chest tightness, pain -difficulty breathing, wheezing -dizziness -red, swollen painful area on the leg Side effects that usually do not require medical attention (report to your doctor or health care professional if they continue or are bothersome): -diarrhea -headache This list may not describe all possible side effects. Call your doctor for medical advice about side effects. You may report side effects to FDA at 1-800-FDA-1088. Where should I keep my medicine? Keep out of the reach of children. Store at room temperature between 15 and 30 degrees C (59 and 85 degrees F). Protect from light. Throw away any unused medicine after the expiration date. NOTE: This sheet is a summary. It may not cover all possible information. If you have questions about this medicine, talk to your doctor, pharmacist, or   health care provider.  2018 Elsevier/Gold Standard (2007-09-03 22:10:20)  

## 2018-02-07 ENCOUNTER — Encounter: Payer: Self-pay | Admitting: Family Medicine

## 2018-02-07 ENCOUNTER — Other Ambulatory Visit: Payer: Medicare HMO

## 2018-02-07 DIAGNOSIS — E0500 Thyrotoxicosis with diffuse goiter without thyrotoxic crisis or storm: Secondary | ICD-10-CM | POA: Diagnosis not present

## 2018-02-07 DIAGNOSIS — H02536 Eyelid retraction left eye, unspecified eyelid: Secondary | ICD-10-CM | POA: Insufficient documentation

## 2018-02-07 DIAGNOSIS — H02535 Eyelid retraction left lower eyelid: Secondary | ICD-10-CM | POA: Diagnosis not present

## 2018-02-07 DIAGNOSIS — H05242 Constant exophthalmos, left eye: Secondary | ICD-10-CM | POA: Diagnosis not present

## 2018-02-07 DIAGNOSIS — H5022 Vertical strabismus, left eye: Secondary | ICD-10-CM | POA: Diagnosis not present

## 2018-02-07 DIAGNOSIS — H11442 Conjunctival cysts, left eye: Secondary | ICD-10-CM | POA: Diagnosis not present

## 2018-02-26 ENCOUNTER — Other Ambulatory Visit: Payer: Self-pay | Admitting: *Deleted

## 2018-02-26 DIAGNOSIS — J301 Allergic rhinitis due to pollen: Secondary | ICD-10-CM

## 2018-02-26 MED ORDER — FLUTICASONE PROPIONATE 50 MCG/ACT NA SUSP: 2.0000 | Freq: Every day | NASAL | 0 refills | Status: DC

## 2018-03-02 DIAGNOSIS — Z888 Allergy status to other drugs, medicaments and biological substances status: Secondary | ICD-10-CM | POA: Diagnosis not present

## 2018-03-02 DIAGNOSIS — Z885 Allergy status to narcotic agent status: Secondary | ICD-10-CM | POA: Diagnosis not present

## 2018-03-02 DIAGNOSIS — H02536 Eyelid retraction left eye, unspecified eyelid: Secondary | ICD-10-CM | POA: Diagnosis not present

## 2018-03-02 DIAGNOSIS — H11442 Conjunctival cysts, left eye: Secondary | ICD-10-CM | POA: Diagnosis not present

## 2018-03-02 DIAGNOSIS — Z882 Allergy status to sulfonamides status: Secondary | ICD-10-CM | POA: Diagnosis not present

## 2018-03-02 DIAGNOSIS — Z961 Presence of intraocular lens: Secondary | ICD-10-CM | POA: Diagnosis not present

## 2018-03-02 DIAGNOSIS — E0500 Thyrotoxicosis with diffuse goiter without thyrotoxic crisis or storm: Secondary | ICD-10-CM | POA: Diagnosis not present

## 2018-03-02 DIAGNOSIS — Z881 Allergy status to other antibiotic agents status: Secondary | ICD-10-CM | POA: Diagnosis not present

## 2018-03-08 ENCOUNTER — Ambulatory Visit (INDEPENDENT_AMBULATORY_CARE_PROVIDER_SITE_OTHER): Payer: Medicare HMO | Admitting: *Deleted

## 2018-03-08 DIAGNOSIS — E538 Deficiency of other specified B group vitamins: Secondary | ICD-10-CM | POA: Diagnosis not present

## 2018-03-08 DIAGNOSIS — Z23 Encounter for immunization: Secondary | ICD-10-CM

## 2018-03-08 NOTE — Progress Notes (Signed)
Vitamin b12 injection given and flu shot given patient tolerated well.

## 2018-03-14 DIAGNOSIS — H02535 Eyelid retraction left lower eyelid: Secondary | ICD-10-CM | POA: Diagnosis not present

## 2018-03-14 DIAGNOSIS — H11442 Conjunctival cysts, left eye: Secondary | ICD-10-CM | POA: Diagnosis not present

## 2018-03-14 DIAGNOSIS — Z7982 Long term (current) use of aspirin: Secondary | ICD-10-CM | POA: Diagnosis not present

## 2018-03-26 DIAGNOSIS — H02535 Eyelid retraction left lower eyelid: Secondary | ICD-10-CM | POA: Diagnosis not present

## 2018-03-26 DIAGNOSIS — H11442 Conjunctival cysts, left eye: Secondary | ICD-10-CM | POA: Diagnosis not present

## 2018-03-26 DIAGNOSIS — Z7982 Long term (current) use of aspirin: Secondary | ICD-10-CM | POA: Diagnosis not present

## 2018-04-09 ENCOUNTER — Other Ambulatory Visit: Payer: Medicare HMO

## 2018-04-09 DIAGNOSIS — E0500 Thyrotoxicosis with diffuse goiter without thyrotoxic crisis or storm: Secondary | ICD-10-CM | POA: Diagnosis not present

## 2018-04-09 DIAGNOSIS — E538 Deficiency of other specified B group vitamins: Secondary | ICD-10-CM

## 2018-04-09 DIAGNOSIS — E7800 Pure hypercholesterolemia, unspecified: Secondary | ICD-10-CM

## 2018-04-09 DIAGNOSIS — E559 Vitamin D deficiency, unspecified: Secondary | ICD-10-CM | POA: Diagnosis not present

## 2018-04-09 DIAGNOSIS — D696 Thrombocytopenia, unspecified: Secondary | ICD-10-CM | POA: Diagnosis not present

## 2018-04-09 DIAGNOSIS — K219 Gastro-esophageal reflux disease without esophagitis: Secondary | ICD-10-CM

## 2018-04-09 DIAGNOSIS — I10 Essential (primary) hypertension: Secondary | ICD-10-CM

## 2018-04-10 ENCOUNTER — Ambulatory Visit (INDEPENDENT_AMBULATORY_CARE_PROVIDER_SITE_OTHER): Payer: Medicare HMO | Admitting: Family Medicine

## 2018-04-10 ENCOUNTER — Ambulatory Visit: Payer: Medicare HMO

## 2018-04-10 ENCOUNTER — Encounter: Payer: Self-pay | Admitting: Family Medicine

## 2018-04-10 VITALS — BP 90/60 | HR 68 | Temp 97.0°F | Ht 67.0 in | Wt 170.0 lb

## 2018-04-10 DIAGNOSIS — E0590 Thyrotoxicosis, unspecified without thyrotoxic crisis or storm: Secondary | ICD-10-CM | POA: Diagnosis not present

## 2018-04-10 DIAGNOSIS — E538 Deficiency of other specified B group vitamins: Secondary | ICD-10-CM | POA: Diagnosis not present

## 2018-04-10 DIAGNOSIS — E7800 Pure hypercholesterolemia, unspecified: Secondary | ICD-10-CM

## 2018-04-10 DIAGNOSIS — E0500 Thyrotoxicosis with diffuse goiter without thyrotoxic crisis or storm: Secondary | ICD-10-CM | POA: Diagnosis not present

## 2018-04-10 DIAGNOSIS — K219 Gastro-esophageal reflux disease without esophagitis: Secondary | ICD-10-CM | POA: Diagnosis not present

## 2018-04-10 DIAGNOSIS — I10 Essential (primary) hypertension: Secondary | ICD-10-CM | POA: Diagnosis not present

## 2018-04-10 DIAGNOSIS — E559 Vitamin D deficiency, unspecified: Secondary | ICD-10-CM | POA: Diagnosis not present

## 2018-04-10 LAB — BMP8+EGFR
BUN/Creatinine Ratio: 15 (ref 10–24)
BUN: 21 mg/dL (ref 8–27)
CALCIUM: 9.6 mg/dL (ref 8.6–10.2)
CHLORIDE: 100 mmol/L (ref 96–106)
CO2: 26 mmol/L (ref 20–29)
Creatinine, Ser: 1.44 mg/dL — ABNORMAL HIGH (ref 0.76–1.27)
GFR calc Af Amer: 53 mL/min/{1.73_m2} — ABNORMAL LOW (ref >59)
GFR calc non Af Amer: 46 mL/min/{1.73_m2} — ABNORMAL LOW (ref >59)
GLUCOSE: 88 mg/dL (ref 65–99)
Potassium: 4.4 mmol/L (ref 3.5–5.2)
Sodium: 142 mmol/L (ref 134–144)

## 2018-04-10 LAB — CBC WITH DIFFERENTIAL/PLATELET
BASOS ABS: 0.1 10*3/uL (ref 0.0–0.2)
Basos: 1 %
EOS (ABSOLUTE): 0.1 10*3/uL (ref 0.0–0.4)
Eos: 2 %
Hematocrit: 44 % (ref 37.5–51.0)
Hemoglobin: 14.8 g/dL (ref 13.0–17.7)
Immature Grans (Abs): 0 10*3/uL (ref 0.0–0.1)
Immature Granulocytes: 0 %
LYMPHS ABS: 1.7 10*3/uL (ref 0.7–3.1)
LYMPHS: 29 %
MCH: 30.8 pg (ref 26.6–33.0)
MCHC: 33.6 g/dL (ref 31.5–35.7)
MCV: 92 fL (ref 79–97)
MONOCYTES: 10 %
Monocytes Absolute: 0.6 10*3/uL (ref 0.1–0.9)
Neutrophils Absolute: 3.3 10*3/uL (ref 1.4–7.0)
Neutrophils: 58 %
PLATELETS: 161 10*3/uL (ref 150–450)
RBC: 4.8 x10E6/uL (ref 4.14–5.80)
RDW: 13.7 % (ref 12.3–15.4)
WBC: 5.7 10*3/uL (ref 3.4–10.8)

## 2018-04-10 LAB — HEPATIC FUNCTION PANEL
ALBUMIN: 4.3 g/dL (ref 3.5–4.8)
ALT: 20 IU/L (ref 0–44)
AST: 23 IU/L (ref 0–40)
Alkaline Phosphatase: 72 IU/L (ref 39–117)
BILIRUBIN TOTAL: 0.5 mg/dL (ref 0.0–1.2)
BILIRUBIN, DIRECT: 0.13 mg/dL (ref 0.00–0.40)
Total Protein: 6.6 g/dL (ref 6.0–8.5)

## 2018-04-10 LAB — LIPID PANEL
CHOLESTEROL TOTAL: 128 mg/dL (ref 100–199)
Chol/HDL Ratio: 3.1 ratio (ref 0.0–5.0)
HDL: 41 mg/dL (ref >39)
LDL Calculated: 66 mg/dL (ref 0–99)
TRIGLYCERIDES: 103 mg/dL (ref 0–149)
VLDL Cholesterol Cal: 21 mg/dL (ref 5–40)

## 2018-04-10 LAB — THYROID PANEL WITH TSH
Free Thyroxine Index: 2.6 (ref 1.2–4.9)
T3 UPTAKE RATIO: 28 % (ref 24–39)
T4 TOTAL: 9.2 ug/dL (ref 4.5–12.0)
TSH: 3.67 u[IU]/mL (ref 0.450–4.500)

## 2018-04-10 LAB — VITAMIN D 25 HYDROXY (VIT D DEFICIENCY, FRACTURES): VIT D 25 HYDROXY: 54.4 ng/mL (ref 30.0–100.0)

## 2018-04-10 NOTE — Progress Notes (Signed)
Subjective:    Patient ID: Mario Smith, male    DOB: 1941-03-20, 77 y.o.   MRN: [id number]  HPI Pt here for follow up and management of chronic medical problems which includes hypertension and hyperlipidemia. He is taking medication regularly.  This patient has had a fairly rough year but things are looking some better.  He has a history of Graves' disease.  He recently had an eyelid lift because of the consequences of the Graves' disease.  He is seeing an endocrinologist at [facility].  He is also seeing the eye surgeon there.  The patient also was having a lot of trouble with his stomach and the summer had a cholecystectomy.  Today he complains mostly of nasal discharge.  He has had lab work done and this will be reviewed with him during the visit today.  All liver function tests are normal.  The blood sugar was good at 88.  The creatinine, the most important kidney function test was slightly more elevated than 3 months ago at 1.44 and all of the electrolytes including potassium are good the CBC has a normal white blood cell count.  The hemoglobin is good at 14.8 and the platelet count is adequate.  All cholesterol numbers were good including the LDL C being good at 66 the good cholesterol actually being within normal limits this time and triglycerides are good at 103.  All thyroid function tests were normal.  The vitamin D level was good at 54.4.  The patient is doing well overall.  He feels the best today and is slowly getting his energy level back after a long period of problems with his eyes his abdominal pain and his Graves' disease.  He has had surgery on both eyes.  He denies any chest pain or shortness of breath.  He denies any trouble with swallowing heartburn indigestion nausea vomiting diarrhea blood in the stool or black tarry bowel movements.  He does complain of some constipation and has a bowel movement about every 3 days and he takes MiraLAX on an  as-needed basis.  He is passing his water without problems.  He does have a return appointment to follow-up on his eyes and the surgery that he has had.  He is not sure of any return appointment to see the endocrinologist.  His vital signs today are stable.  He is currently taking Flonase Claritin for his allergies.  He does have an overhead fan that is being used at home.  I discouraged the use of this.    Patient Active Problem List   Diagnosis Date Noted  . Gastroesophageal reflux disease 10/18/2017  . Nausea and vomiting   . Gastritis   . Abdominal pain 10/10/2017  . Dehydration 10/10/2017  . HTN (hypertension) 10/10/2017  . HLD (hyperlipidemia) 10/10/2017  . Vertigo 10/10/2017  . Anemia 10/10/2017  . 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 10/10/2017  . B12 deficiency 10/06/2017  . Graves disease 10/27/2016  . Elevated PSA 10/27/2016  . Thrombocytopenia ([place]) 06/25/2016  . Hyperthyroidism 02/24/2016  . Hyperlipidemia 05/27/2013  . Vitamin D deficiency 05/27/2013  . Hypertension 02/11/2010   Outpatient Encounter Medications as of 04/10/2018  Medication Sig  . aspirin 81 MG EC tablet Take 81 mg by mouth daily.    . carboxymethylcellulose (REFRESH PLUS) 0.5 % SOLN Place 1 drop into both eyes as needed (dry eyes).  . Cholecalciferol (VITAMIN D3) 2000 UNITS TABS Take 2,000 Units by mouth daily.   . Coenzyme  Q10 (CO Q 10) 100 MG CAPS Take 100 mg by mouth daily.   . fluticasone (FLONASE) 50 MCG/ACT nasal spray Place 2 sprays into both nostrils daily.  . hydrochlorothiazide (MICROZIDE) 12.5 MG capsule Take 2 capsules (25 mg total) by mouth daily. (Patient taking differently: Take 12.5 mg by mouth 2 (two) times daily. )  . linaclotide (LINZESS) 72 MCG capsule Take 1 capsule (72 mcg total) by mouth daily before breakfast.  . Magnesium Hydroxide (MILK OF MAGNESIA PO) Take 15 mLs by mouth every other day.  . methimazole (TAPAZOLE) 5 MG tablet Take 5 mg by mouth every Monday, Wednesday, and Friday.    . metoprolol tartrate (LOPRESSOR) 25 MG tablet Take 1 tablet (25 mg total) by mouth 2 (two) times daily.  . Omega-3 Fatty Acids (FISH OIL) 1000 MG CAPS Take 1,000 mg by mouth daily.   . rosuvastatin (CRESTOR) 10 MG tablet Take 1 tablet (10 mg total) by mouth daily.  Marland Kitchen selenium 200 MCG TABS tablet Take 1 tablet (200 mcg total) by mouth daily.  Marland Kitchen telmisartan (MICARDIS) 80 MG tablet Take 0.5 tablets (40 mg total) by mouth daily.  . traMADol (ULTRAM) 50 MG tablet Take 1-2 tablets (50-100 mg total) by mouth every 6 (six) hours as needed.  . pantoprazole (PROTONIX) 40 MG tablet Take 1 tablet (40 mg total) by mouth 2 (two) times daily. (Patient taking differently: Take 40 mg by mouth daily. )  . [DISCONTINUED] diazepam (VALIUM) 2 MG tablet Take 2 mg by mouth daily as needed for anxiety.  . [DISCONTINUED] meclizine (ANTIVERT) 25 MG tablet Take 1 tablet (25 mg total) by mouth 3 (three) times daily as needed for dizziness.  . [DISCONTINUED] promethazine (PHENERGAN) 25 MG tablet Take 1 tablet (25 mg total) by mouth every 8 (eight) hours as needed for nausea or vomiting.  . [DISCONTINUED] sucralfate (CARAFATE) 1 g tablet Take 1 tablet (1 g total) by mouth 4 (four) times daily -  with meals and at bedtime.   Facility-Administered Encounter Medications as of 04/10/2018  Medication  . cyanocobalamin ((VITAMIN B-12)) injection 1,000 mcg     Review of Systems  Constitutional: Negative.   HENT: Positive for postnasal drip.   Eyes: Negative.   Respiratory: Negative.   Cardiovascular: Negative.   Gastrointestinal: Negative.   Endocrine: Negative.   Genitourinary: Negative.   Musculoskeletal: Negative.   Skin: Negative.   Allergic/Immunologic: Negative.   Neurological: Negative.   Hematological: Negative.   Psychiatric/Behavioral: Negative.        Objective:   Physical Exam  Constitutional: He is oriented to person, place, and time. He appears well-developed and well-nourished. No distress.  The  patient is pleasant and alert and is thankful to be finally feeling better  HENT:  Head: Normocephalic and atraumatic.  Right Ear: External ear normal.  Left Ear: External ear normal.  Mouth/Throat: Oropharynx is clear and moist. No oropharyngeal exudate.  Nasal turbinate congestion worse on the left than the right.  Eyes: Pupils are equal, round, and reactive to light. Conjunctivae and EOM are normal. Right eye exhibits no discharge. Left eye exhibits no discharge. No scleral icterus.  Continue to follow-up with ophthalmology  Neck: Normal range of motion. Neck supple. No thyromegaly present.  No bruits thyromegaly or anterior cervical adenopathy  Cardiovascular: Normal rate, regular rhythm, normal heart sounds and intact distal pulses.  No murmur heard. Heart is regular at 60/min with good pedal pulses  Pulmonary/Chest: Effort normal and breath sounds normal. He has no wheezes.  He has no rales. He exhibits no tenderness.  Lungs are clear anteriorly and posteriorly and no axillary adenopathy or chest wall masses.  Abdominal: Soft. Bowel sounds are normal. He exhibits no mass. There is tenderness.  Minimal generalized abdominal tenderness with gas.  No liver or spleen enlargement no bruits and no inguinal adenopathy  Musculoskeletal: Normal range of motion. He exhibits no edema or tenderness.  Lymphadenopathy:    He has no cervical adenopathy.  Neurological: He is alert and oriented to person, place, and time. He has normal reflexes. No cranial nerve deficit.  Minimal exophthalmos bilaterally.  Still seeing ophthalmologist.  Skin: Skin is warm and dry. No rash noted.  Psychiatric: He has a normal mood and affect. His behavior is normal. Judgment and thought content normal.  The patient's mood affect and behavior are normal for this patient.  He is feeling better and his attitude is much more positive.  Nursing note and vitals reviewed.  BP 90/60 (BP Location: Left Arm)   Pulse 68   Temp  (!) 97 F (36.1 C) (Oral)   Ht 5\' 7"  (1.702 m)   Wt 170 lb (77.1 kg)   BMI 26.63 kg/m        Assessment & Plan:  1. Vitamin D deficiency -Continue with vitamin D replacement  2. Pure hypercholesterolemia -Continue with diet and increase activity as tolerated  3. Gastroesophageal reflux disease, esophagitis presence not specified -The gastritis symptoms are better.  He will continue to avoid NSAIDs.  4. Graves disease -Continue with current thyroid treatment and follow-up with endocrinologist as planned  5. Hypertension -The blood pressure is running in the 90s and sometimes in the 64Q systolic.  The patient will reduce his HCTZ slightly and we will follow-up on blood pressure readings after a month.  6. Hyperthyroidism -Continue with methimazole as all thyroid tests are normal and follow-up with endocrinologist as planned  7. B12 deficiency -B12 injection today.  Continue with B12 shots.  Patient Instructions                       Medicare Annual Wellness Visit  [facility] and the medical providers at [facility] strive to bring you the best medical care.  In doing so we not only want to address your current medical conditions and concerns but also to detect new conditions early and prevent illness, disease and health-related problems.    Medicare offers a yearly Wellness Visit which allows our clinical staff to assess your need for preventative services including immunizations, lifestyle education, counseling to decrease risk of preventable diseases and screening for fall risk and other medical concerns.    This visit is provided free of charge (no copay) for all Medicare recipients. The clinical pharmacists at [facility] have begun to conduct these Wellness Visits which will also include a thorough review of all your medications.    As you primary medical provider recommend that you make an appointment for your Annual Wellness  Visit if you have not done so already this year.  You may set up this appointment before you leave today or you may call back ([phone]) and schedule an appointment.  Please make sure when you call that you mention that you are scheduling your Annual Wellness Visit with the clinical pharmacist so that the appointment may be made for the proper length of time.     Continue current medications. Continue good therapeutic lifestyle changes which include good  diet and exercise. Fall precautions discussed with patient. If an FOBT was given today- please return it to our front desk. If you are over 81 years old - you may need Prevnar 26 or the adult Pneumonia vaccine.  **Flu shots are available--- please call and schedule a FLU-CLINIC appointment**  After your visit with [place] today you will receive a survey in the mail or online from [employer] regarding your care with [place]. Please take a moment to fill this out. Your feedback is very important to [place] as you can help [place] better understand your patient needs as well as improve your experience and satisfaction. WE CARE ABOUT YOU!!!   Use the HCTZ and just take one on Monday Wednesday and Friday but continue to take 1 twice a day on all the other days. Monitor blood pressures at home and bring readings by for review in 4 weeks Continue with current allergy medicine and use nasal saline 3 or 4 times daily in each nostril Stop the use of the overhead fan as this dries you out and make sure allergies worse Continue to drink plenty of fluids and stay well-hydrated Follow-up with ophthalmologist regarding the eye surgery as planned and be sure and check with him and with Dr. Nicoletta Dress when you need to see both of these doctors back  Arrie Senate MD

## 2018-04-10 NOTE — Patient Instructions (Addendum)
Medicare Annual Wellness Visit  [facility] and the medical providers at [facility] strive to bring you the best medical care.  In doing so we not only want to address your current medical conditions and concerns but also to detect new conditions early and prevent illness, disease and health-related problems.    Medicare offers a yearly Wellness Visit which allows our clinical staff to assess your need for preventative services including immunizations, lifestyle education, counseling to decrease risk of preventable diseases and screening for fall risk and other medical concerns.    This visit is provided free of charge (no copay) for all Medicare recipients. The clinical pharmacists at [facility] have begun to conduct these Wellness Visits which will also include a thorough review of all your medications.    As you primary medical provider recommend that you make an appointment for your Annual Wellness Visit if you have not done so already this year.  You may set up this appointment before you leave today or you may call back ([phone]) and schedule an appointment.  Please make sure when you call that you mention that you are scheduling your Annual Wellness Visit with the clinical pharmacist so that the appointment may be made for the proper length of time.     Continue current medications. Continue good therapeutic lifestyle changes which include good diet and exercise. Fall precautions discussed with patient. If an FOBT was given today- please return it to our front desk. If you are over 91 years old - you may need Prevnar 17 or the adult Pneumonia vaccine.  **Flu shots are available--- please call and schedule a FLU-CLINIC appointment**  After your visit with [place] today you will receive a survey in the mail or online from [employer] regarding your care with [place]. Please take a moment to fill this out. Your feedback is very  important to [place] as you can help [place] better understand your patient needs as well as improve your experience and satisfaction. WE CARE ABOUT YOU!!!   Use the HCTZ and just take one on Monday Wednesday and Friday but continue to take 1 twice a day on all the other days. Monitor blood pressures at home and bring readings by for review in 4 weeks Continue with current allergy medicine and use nasal saline 3 or 4 times daily in each nostril Stop the use of the overhead fan as this dries you out and make sure allergies worse Continue to drink plenty of fluids and stay well-hydrated Follow-up with ophthalmologist regarding the eye surgery as planned and be sure and check with him and with Dr. Nicoletta Dress when you need to see both of these doctors back

## 2018-05-01 DIAGNOSIS — Z882 Allergy status to sulfonamides status: Secondary | ICD-10-CM | POA: Diagnosis not present

## 2018-05-01 DIAGNOSIS — H11133 Conjunctival pigmentations, bilateral: Secondary | ICD-10-CM | POA: Diagnosis not present

## 2018-05-01 DIAGNOSIS — Z881 Allergy status to other antibiotic agents status: Secondary | ICD-10-CM | POA: Diagnosis not present

## 2018-05-01 DIAGNOSIS — Z48810 Encounter for surgical aftercare following surgery on the sense organs: Secondary | ICD-10-CM | POA: Diagnosis not present

## 2018-05-01 DIAGNOSIS — E0500 Thyrotoxicosis with diffuse goiter without thyrotoxic crisis or storm: Secondary | ICD-10-CM | POA: Diagnosis not present

## 2018-05-01 DIAGNOSIS — Z885 Allergy status to narcotic agent status: Secondary | ICD-10-CM | POA: Diagnosis not present

## 2018-05-01 DIAGNOSIS — Z9889 Other specified postprocedural states: Secondary | ICD-10-CM | POA: Diagnosis not present

## 2018-05-11 ENCOUNTER — Ambulatory Visit (INDEPENDENT_AMBULATORY_CARE_PROVIDER_SITE_OTHER): Payer: Medicare HMO | Admitting: *Deleted

## 2018-05-11 DIAGNOSIS — E538 Deficiency of other specified B group vitamins: Secondary | ICD-10-CM

## 2018-05-11 NOTE — Progress Notes (Signed)
Pt given Cyanocobalamin inj Tolerated well 

## 2018-06-01 ENCOUNTER — Encounter: Payer: Self-pay | Admitting: Pediatrics

## 2018-06-01 ENCOUNTER — Ambulatory Visit (INDEPENDENT_AMBULATORY_CARE_PROVIDER_SITE_OTHER): Payer: Medicare HMO

## 2018-06-01 ENCOUNTER — Ambulatory Visit (INDEPENDENT_AMBULATORY_CARE_PROVIDER_SITE_OTHER): Payer: Medicare HMO | Admitting: Pediatrics

## 2018-06-01 VITALS — BP 125/84 | HR 84 | Temp 99.1°F | Resp 20 | Ht 67.0 in | Wt 176.2 lb

## 2018-06-01 DIAGNOSIS — R05 Cough: Secondary | ICD-10-CM

## 2018-06-01 DIAGNOSIS — J189 Pneumonia, unspecified organism: Secondary | ICD-10-CM

## 2018-06-01 DIAGNOSIS — N183 Chronic kidney disease, stage 3 unspecified: Secondary | ICD-10-CM

## 2018-06-01 DIAGNOSIS — R059 Cough, unspecified: Secondary | ICD-10-CM

## 2018-06-01 DIAGNOSIS — J181 Lobar pneumonia, unspecified organism: Secondary | ICD-10-CM | POA: Diagnosis not present

## 2018-06-01 DIAGNOSIS — R509 Fever, unspecified: Secondary | ICD-10-CM

## 2018-06-01 MED ORDER — LEVOFLOXACIN 250 MG PO TABS: ORAL_TABLET | ORAL | 0 refills | Status: DC

## 2018-06-01 NOTE — Progress Notes (Signed)
  Subjective:   Patient ID: Mario Smith, male    DOB: 02-21-1941, 77 y.o.   MRN: [id number] CC: Cough (5 days) and Nasal Congestion  HPI: Mario Smith is a 77 y.o. male   Coughing for the past 5 days, has been getting worse in the last 2 days.  Cough is productive, coughing up dark green.  Having chills, feeling achy.  More short of breath with walking than he usually is.  No history of lung problems, does not usually take or need inhalers.  Does have Graves' disease with eye complications that he is following with specialists and his PCP for.  Relevant past medical, surgical, family and social history reviewed. Allergies and medications reviewed and updated. Social History   Tobacco Use  Smoking Status Former Smoker  . Types: Cigarettes  . Last attempt to quit: 12/19/1992  . Years since quitting: 25.4  Smokeless Tobacco Never Used   ROS: Per HPI   Objective:    BP 125/84   Pulse 84   Temp 99.1 F (37.3 C) (Oral)   Resp 20   Ht 5\' 7"  (1.702 m)   Wt 176 lb 3.2 oz (79.9 kg)   SpO2 94%   BMI 27.60 kg/m   Wt Readings from Last 3 Encounters:  06/01/18 176 lb 3.2 oz (79.9 kg)  04/10/18 170 lb (77.1 kg)  01/18/18 164 lb (74.4 kg)    Gen: NAD, alert, cooperative with exam, NCAT EYES: EOMI, no conjunctival injection, +proptosis ENT:  TMs dull gray with layering effusion b/l, OP without erythema LYMPH: no cervical LAD CV: NRRR, normal S1/S2 Resp: scattered rhonchi b/l, comfortable WOB Abd: +BS, soft, NTND. Ext: No edema, warm Neuro: Alert and oriented  Assessment & Plan:  Mario Smith was seen today for cough and nasal congestion.  Diagnoses and all orders for this visit:  Community acquired pneumonia of right lower lobe of lung ([place]) Clinical diagnosis, treat with below renally dosed for CKD stage 3, return precautions and symptom care discussed -     levofloxacin (LEVAQUIN) 250 MG tablet; Take 500mg  day 1, 250mg  day 2-7  Fever, unspecified fever cause -     DG Chest 2 View;  Future  Cough -     DG Chest 2 View; Future  Stage 3 chronic kidney disease ([place])   Follow up plan: Return if symptoms worsen or fail to improve. Assunta Found, MD [place]

## 2018-06-11 ENCOUNTER — Other Ambulatory Visit: Payer: Self-pay | Admitting: Family Medicine

## 2018-06-11 DIAGNOSIS — J301 Allergic rhinitis due to pollen: Secondary | ICD-10-CM

## 2018-06-12 ENCOUNTER — Ambulatory Visit (INDEPENDENT_AMBULATORY_CARE_PROVIDER_SITE_OTHER): Payer: Medicare HMO | Admitting: *Deleted

## 2018-06-12 DIAGNOSIS — E538 Deficiency of other specified B group vitamins: Secondary | ICD-10-CM

## 2018-06-12 NOTE — Progress Notes (Signed)
Pt given cyanocobalamin inj Tolerated well 

## 2018-06-28 ENCOUNTER — Other Ambulatory Visit: Payer: Self-pay | Admitting: Family Medicine

## 2018-07-13 ENCOUNTER — Ambulatory Visit (INDEPENDENT_AMBULATORY_CARE_PROVIDER_SITE_OTHER): Payer: Medicare HMO | Admitting: *Deleted

## 2018-07-13 DIAGNOSIS — E538 Deficiency of other specified B group vitamins: Secondary | ICD-10-CM | POA: Diagnosis not present

## 2018-07-13 NOTE — Progress Notes (Signed)
Pt given Cyanocobalamin inj Tolerated well 

## 2018-08-02 DIAGNOSIS — Z9889 Other specified postprocedural states: Secondary | ICD-10-CM | POA: Diagnosis not present

## 2018-08-02 DIAGNOSIS — E0500 Thyrotoxicosis with diffuse goiter without thyrotoxic crisis or storm: Secondary | ICD-10-CM | POA: Diagnosis not present

## 2018-08-13 ENCOUNTER — Ambulatory Visit (INDEPENDENT_AMBULATORY_CARE_PROVIDER_SITE_OTHER): Payer: Medicare HMO | Admitting: *Deleted

## 2018-08-13 DIAGNOSIS — E538 Deficiency of other specified B group vitamins: Secondary | ICD-10-CM

## 2018-08-13 NOTE — Progress Notes (Signed)
Pt given Cyanocobalamin inj Tolerated well 

## 2018-08-20 ENCOUNTER — Other Ambulatory Visit: Payer: Self-pay

## 2018-08-20 ENCOUNTER — Other Ambulatory Visit: Payer: Medicare HMO

## 2018-08-20 DIAGNOSIS — K219 Gastro-esophageal reflux disease without esophagitis: Secondary | ICD-10-CM

## 2018-08-20 DIAGNOSIS — I10 Essential (primary) hypertension: Secondary | ICD-10-CM

## 2018-08-20 DIAGNOSIS — E538 Deficiency of other specified B group vitamins: Secondary | ICD-10-CM | POA: Diagnosis not present

## 2018-08-20 DIAGNOSIS — N183 Chronic kidney disease, stage 3 unspecified: Secondary | ICD-10-CM

## 2018-08-20 DIAGNOSIS — E7800 Pure hypercholesterolemia, unspecified: Secondary | ICD-10-CM | POA: Diagnosis not present

## 2018-08-20 DIAGNOSIS — E559 Vitamin D deficiency, unspecified: Secondary | ICD-10-CM | POA: Diagnosis not present

## 2018-08-20 DIAGNOSIS — E0500 Thyrotoxicosis with diffuse goiter without thyrotoxic crisis or storm: Secondary | ICD-10-CM

## 2018-08-21 LAB — LIPID PANEL
CHOL/HDL RATIO: 3.4 ratio (ref 0.0–5.0)
Cholesterol, Total: 135 mg/dL (ref 100–199)
HDL: 40 mg/dL (ref >39)
LDL CALC: 76 mg/dL (ref 0–99)
TRIGLYCERIDES: 94 mg/dL (ref 0–149)
VLDL CHOLESTEROL CAL: 19 mg/dL (ref 5–40)

## 2018-08-21 LAB — HEPATIC FUNCTION PANEL
ALBUMIN: 4.3 g/dL (ref 3.7–4.7)
ALK PHOS: 67 IU/L (ref 39–117)
ALT: 16 IU/L (ref 0–44)
AST: 23 IU/L (ref 0–40)
Bilirubin Total: 0.6 mg/dL (ref 0.0–1.2)
Bilirubin, Direct: 0.16 mg/dL (ref 0.00–0.40)
Total Protein: 6.9 g/dL (ref 6.0–8.5)

## 2018-08-21 LAB — BMP8+EGFR
BUN/Creatinine Ratio: 11 (ref 10–24)
BUN: 15 mg/dL (ref 8–27)
CALCIUM: 10 mg/dL (ref 8.6–10.2)
CO2: 25 mmol/L (ref 20–29)
CREATININE: 1.36 mg/dL — AB (ref 0.76–1.27)
Chloride: 103 mmol/L (ref 96–106)
GFR, EST AFRICAN AMERICAN: 57 mL/min/{1.73_m2} — AB (ref >59)
GFR, EST NON AFRICAN AMERICAN: 49 mL/min/{1.73_m2} — AB (ref >59)
Glucose: 114 mg/dL — ABNORMAL HIGH (ref 65–99)
POTASSIUM: 4.5 mmol/L (ref 3.5–5.2)
Sodium: 143 mmol/L (ref 134–144)

## 2018-08-21 LAB — CBC WITH DIFFERENTIAL/PLATELET
BASOS: 1 %
Basophils Absolute: 0.1 10*3/uL (ref 0.0–0.2)
EOS (ABSOLUTE): 0.1 10*3/uL (ref 0.0–0.4)
Eos: 2 %
HEMATOCRIT: 46 % (ref 37.5–51.0)
Hemoglobin: 15.3 g/dL (ref 13.0–17.7)
IMMATURE GRANS (ABS): 0 10*3/uL (ref 0.0–0.1)
IMMATURE GRANULOCYTES: 0 %
LYMPHS: 28 %
Lymphocytes Absolute: 1.7 10*3/uL (ref 0.7–3.1)
MCH: 30.4 pg (ref 26.6–33.0)
MCHC: 33.3 g/dL (ref 31.5–35.7)
MCV: 92 fL (ref 79–97)
Monocytes Absolute: 0.5 10*3/uL (ref 0.1–0.9)
Monocytes: 9 %
NEUTROS PCT: 60 %
Neutrophils Absolute: 3.6 10*3/uL (ref 1.4–7.0)
Platelets: 150 10*3/uL (ref 150–450)
RBC: 5.03 x10E6/uL (ref 4.14–5.80)
RDW: 13.1 % (ref 11.6–15.4)
WBC: 6 10*3/uL (ref 3.4–10.8)

## 2018-08-21 LAB — THYROID PANEL WITH TSH
Free Thyroxine Index: 2.2 (ref 1.2–4.9)
T3 Uptake Ratio: 28 % (ref 24–39)
T4, Total: 7.8 ug/dL (ref 4.5–12.0)
TSH: 3.24 u[IU]/mL (ref 0.450–4.500)

## 2018-08-21 LAB — VITAMIN D 25 HYDROXY (VIT D DEFICIENCY, FRACTURES): Vit D, 25-Hydroxy: 53.5 ng/mL (ref 30.0–100.0)

## 2018-08-22 ENCOUNTER — Other Ambulatory Visit: Payer: Self-pay

## 2018-08-22 ENCOUNTER — Encounter: Payer: Self-pay | Admitting: Family Medicine

## 2018-08-22 ENCOUNTER — Ambulatory Visit (INDEPENDENT_AMBULATORY_CARE_PROVIDER_SITE_OTHER): Payer: Medicare HMO | Admitting: Family Medicine

## 2018-08-22 VITALS — BP 90/58 | HR 69 | Temp 97.6°F | Ht 67.0 in | Wt 179.0 lb

## 2018-08-22 DIAGNOSIS — D696 Thrombocytopenia, unspecified: Secondary | ICD-10-CM | POA: Diagnosis not present

## 2018-08-22 DIAGNOSIS — E559 Vitamin D deficiency, unspecified: Secondary | ICD-10-CM

## 2018-08-22 DIAGNOSIS — N183 Chronic kidney disease, stage 3 unspecified: Secondary | ICD-10-CM

## 2018-08-22 DIAGNOSIS — E538 Deficiency of other specified B group vitamins: Secondary | ICD-10-CM

## 2018-08-22 DIAGNOSIS — I10 Essential (primary) hypertension: Secondary | ICD-10-CM

## 2018-08-22 DIAGNOSIS — E0500 Thyrotoxicosis with diffuse goiter without thyrotoxic crisis or storm: Secondary | ICD-10-CM | POA: Diagnosis not present

## 2018-08-22 DIAGNOSIS — E0590 Thyrotoxicosis, unspecified without thyrotoxic crisis or storm: Secondary | ICD-10-CM

## 2018-08-22 DIAGNOSIS — K219 Gastro-esophageal reflux disease without esophagitis: Secondary | ICD-10-CM

## 2018-08-22 DIAGNOSIS — N400 Enlarged prostate without lower urinary tract symptoms: Secondary | ICD-10-CM | POA: Diagnosis not present

## 2018-08-22 DIAGNOSIS — E7800 Pure hypercholesterolemia, unspecified: Secondary | ICD-10-CM | POA: Diagnosis not present

## 2018-08-22 NOTE — Patient Instructions (Addendum)
Medicare Annual Wellness Visit  [facility] and the medical providers at [facility] strive to bring you the best medical care.  In doing so we not only want to address your current medical conditions and concerns but also to detect new conditions early and prevent illness, disease and health-related problems.    Medicare offers a yearly Wellness Visit which allows our clinical staff to assess your need for preventative services including immunizations, lifestyle education, counseling to decrease risk of preventable diseases and screening for fall risk and other medical concerns.    This visit is provided free of charge (no copay) for all Medicare recipients. The clinical pharmacists at [facility] have begun to conduct these Wellness Visits which will also include a thorough review of all your medications.    As you primary medical provider recommend that you make an appointment for your Annual Wellness Visit if you have not done so already this year.  You may set up this appointment before you leave today or you may call back ([phone]) and schedule an appointment.  Please make sure when you call that you mention that you are scheduling your Annual Wellness Visit with the clinical pharmacist so that the appointment may be made for the proper length of time.     Continue current medications. Continue good therapeutic lifestyle changes which include good diet and exercise. Fall precautions discussed with patient. If an FOBT was given today- please return it to our front desk. If you are over 50 years old - you may need Prevnar 50 or the adult Pneumonia vaccine.  **Flu shots are available--- please call and schedule a FLU-CLINIC appointment**  After your visit with [place] today you will receive a survey in the mail or online from [employer] regarding your care with [place]. Please take a moment to fill this out. Your feedback is very  important to [place] as you can help [place] better understand your patient needs as well as improve your experience and satisfaction. WE CARE ABOUT YOU!!!   Practice good pulmonary and respiratory hygiene along with hand hygiene. Wash hands regularly Stay out of crowds of people Continue with current medication Drink plenty of fluids and stay well-hydrated Follow-up with eye doctors at [facility] as recommended and with endocrinology if needed

## 2018-08-22 NOTE — Progress Notes (Signed)
Subjective:    Patient ID: Mario Smith, male    DOB: 1940-06-10, 78 y.o.   MRN: [id number]  HPI Pt here for follow up and management of chronic medical problems which includes hypertension and hyperlipidemia. He is taking medication regularly.  This patient is doing well overall.  He has no specific complaints today.  He does have a history of GERD hypertension thyrotoxicosis hyperlipidemia thrombocytopenia and vitamin D replacement.  He is also B12 deficient.  He has been followed by the endocrinologist because of his Graves' disease and is currently on methimazole.  He does have return appointments to see the ophthalmologist at [facility].  He does not have a return appointment to follow-up with his endocrinologist.  Today, he denies any chest pain pressure tightness or shortness of breath.  He denies any trouble with his stomach including swallowing heartburn indigestion nausea vomiting diarrhea blood in the stool or black tarry bowel movements.  He does have some constipation and this is been going on for a while.  He is passing his water well.     Patient Active Problem List   Diagnosis Date Noted   Gastroesophageal reflux disease 10/18/2017   Nausea and vomiting    Gastritis    Abdominal pain 10/10/2017   Dehydration 10/10/2017   HTN (hypertension) 10/10/2017   HLD (hyperlipidemia) 10/10/2017   Vertigo 10/10/2017   Anemia 10/10/2017   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 10/10/2017   B12 deficiency 10/06/2017   Graves disease 10/27/2016   Elevated PSA 10/27/2016   Thrombocytopenia ([place]) 06/25/2016   Hyperthyroidism 02/24/2016   Hyperlipidemia 05/27/2013   Vitamin D deficiency 05/27/2013   Hypertension 02/11/2010   Outpatient Encounter Medications as of 08/22/2018  Medication Sig   aspirin 81 MG EC tablet Take 81 mg by mouth daily.     carboxymethylcellulose (REFRESH PLUS) 0.5 % SOLN Place 1 drop into both eyes as needed (dry  eyes).   Cholecalciferol (VITAMIN D3) 2000 UNITS TABS Take 2,000 Units by mouth daily.    Coenzyme Q10 (CO Q 10) 100 MG CAPS Take 100 mg by mouth daily.    fluticasone (FLONASE) 50 MCG/ACT nasal spray SPRAY 2 SPRAYS INTO EACH NOSTRIL EVERY DAY   hydrochlorothiazide (MICROZIDE) 12.5 MG capsule Take 2 capsules (25 mg total) by mouth daily. (Patient taking differently: Take 12.5 mg by mouth 2 (two) times daily. )   methimazole (TAPAZOLE) 5 MG tablet Take 5 mg by mouth every Monday, Wednesday, and Friday.    metoprolol tartrate (LOPRESSOR) 25 MG tablet TAKE 1 TABLET BY MOUTH TWICE A DAY   Omega-3 Fatty Acids (FISH OIL) 1000 MG CAPS Take 1,000 mg by mouth daily.    pantoprazole (PROTONIX) 40 MG tablet Take 1 tablet (40 mg total) by mouth 2 (two) times daily. (Patient taking differently: Take 40 mg by mouth daily. )   rosuvastatin (CRESTOR) 10 MG tablet Take 1 tablet (10 mg total) by mouth daily.   selenium 200 MCG TABS tablet Take 1 tablet (200 mcg total) by mouth daily.   telmisartan (MICARDIS) 80 MG tablet Take 0.5 tablets (40 mg total) by mouth daily.   [DISCONTINUED] levofloxacin (LEVAQUIN) 250 MG tablet Take 500mg  day 1, 250mg  day 2-7   [DISCONTINUED] linaclotide (LINZESS) 72 MCG capsule Take 1 capsule (72 mcg total) by mouth daily before breakfast.   [DISCONTINUED] Magnesium Hydroxide (MILK OF MAGNESIA PO) Take 15 mLs by mouth every other day.   [DISCONTINUED] traMADol (ULTRAM) 50 MG tablet Take 1-2 tablets (  50-100 mg total) by mouth every 6 (six) hours as needed.   Facility-Administered Encounter Medications as of 08/22/2018  Medication   cyanocobalamin ((VITAMIN B-12)) injection 1,000 mcg     Review of Systems  Constitutional: Negative.   HENT: Negative.   Eyes: Negative.   Respiratory: Negative.   Cardiovascular: Negative.   Gastrointestinal: Negative.   Endocrine: Negative.   Genitourinary: Negative.   Musculoskeletal: Negative.   Skin: Negative.     Allergic/Immunologic: Negative.   Neurological: Negative.   Hematological: Negative.   Psychiatric/Behavioral: Negative.        Objective:   Physical Exam Vitals signs and nursing note reviewed.  Constitutional:      General: He is not in acute distress.    Appearance: He is well-developed. He is not ill-appearing.     Comments: The patient is pleasant and alert.  HENT:     Head: Normocephalic and atraumatic.     Right Ear: Tympanic membrane, ear canal and external ear normal. There is no impacted cerumen.     Left Ear: Tympanic membrane, ear canal and external ear normal. There is no impacted cerumen.     Nose: Nose normal. No congestion.     Mouth/Throat:     Mouth: Mucous membranes are dry.     Pharynx: Oropharynx is clear. No oropharyngeal exudate or posterior oropharyngeal erythema.  Eyes:     General: No scleral icterus.       Right eye: No discharge.        Left eye: No discharge.     Extraocular Movements: Extraocular movements intact.     Conjunctiva/sclera: Conjunctivae normal.     Pupils: Pupils are equal, round, and reactive to light.     Comments: Minimal exophthalmos noted today on exam.  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  Neck:     Musculoskeletal: Normal range of motion and neck supple.     Thyroid: No thyromegaly.     Vascular: No carotid bruit.     Trachea: No tracheal deviation.     Comments: No bruits thyromegaly or anterior cervical adenopathy Cardiovascular:     Rate and Rhythm: Normal rate and regular rhythm.     Heart sounds: Normal heart sounds. No murmur.     Comments: Is regular at 72/min with good pedal pulses and no edema. Pulmonary:     Effort: Pulmonary effort is normal. No respiratory distress.     Breath sounds: Normal breath sounds. No wheezing.     Comments: Clear anteriorly and posteriorly.  No axillary adenopathy chest wall masses or chest  wall tenderness. Chest:     Chest wall: No tenderness.  Abdominal:     General: Abdomen is flat. Bowel sounds are normal.     Palpations: Abdomen is soft. There is no mass.     Tenderness: There is no abdominal tenderness.     Comments: Abdomen is soft without masses tenderness organ enlargement or bruits and patient has good inguinal pulses without adenopathy.  Genitourinary:    Penis: Normal.      Scrotum/Testes: Normal.     Rectum: Normal.     Comments: Prostate is slightly enlarged with out lumps or masses.  There were no inguinal hernias palpable and the external genitalia were within normal limits. Musculoskeletal: Normal range of motion.        General: No tenderness.     Right lower leg: No edema.  Left lower leg: No edema.  Lymphadenopathy:     Cervical: No cervical adenopathy.  Skin:    General: Skin is warm and dry.     Findings: No rash.  Neurological:     General: No focal deficit present.     Mental Status: He is alert and oriented to person, place, and time.     Cranial Nerves: No cranial nerve deficit.     Gait: Gait normal.     Deep Tendon Reflexes: Reflexes are normal and symmetric. Reflexes normal.  Psychiatric:        Mood and Affect: Mood normal.        Behavior: Behavior normal.        Thought Content: Thought content normal.        Judgment: Judgment normal.     Comments: Mood affect and behavior were upbeat and normal for this patient.    BP (!) 90/58 (BP Location: Left Arm)    Pulse 69    Temp 97.6 F (36.4 C) (Oral)    Ht 5\' 7"  (1.702 m)    Wt 179 lb (81.2 kg)    BMI 28.04 kg/m         Assessment & Plan:  1. Stage 3 chronic kidney disease ([place]) -Continue to avoid all NSAIDs and only take Tylenol if needed for aches pains and fever.  Keep blood pressure under good control keep weight down and watch sodium intake  2. Pure hypercholesterolemia -Continue with Crestor and with as aggressive therapeutic lifestyle changes as possible as all  cholesterol numbers were good and at goal.  3. Vitamin D deficiency -The vitamin D level was good he should continue with current treatment  4. Gastroesophageal reflux disease, esophagitis presence not specified -No complaints today with reflux and he should continue with his watching his diet closely and avoiding irritating foods.  5. Graves disease -Follow-up with endocrinology as planned and follow-up with ophthalmology as planned  6. Hypertension -The blood pressure is good today and he will continue with current treatment  7. Hyperthyroidism -Thyroid tests were good and he will continue with his methimazole.  8. Benign prostatic hyperplasia, unspecified whether lower urinary tract symptoms present -Prostate is slightly enlarged with no voiding symptoms - Urinalysis, Complete  9. B12 deficiency -Continue with B12 injections and get B12 level  10. Thrombocytopenia ([facility]) -Platelet count was good today with no increase bruising or bleeding issues noted by patient  Patient Instructions                       Medicare Annual Wellness Visit  [place] and the medical providers at [facility] strive to bring you the best medical care.  In doing so we not only want to address your current medical conditions and concerns but also to detect new conditions early and prevent illness, disease and health-related problems.    Medicare offers a yearly Wellness Visit which allows our clinical staff to assess your need for preventative services including immunizations, lifestyle education, counseling to decrease risk of preventable diseases and screening for fall risk and other medical concerns.    This visit is provided free of charge (no copay) for all Medicare recipients. The clinical pharmacists at [facility] have begun to conduct these Wellness Visits which will also include a thorough review of all your medications.    As you primary medical  provider recommend that you make an appointment for your Annual Wellness Visit  if you have not done so already this year.  You may set up this appointment before you leave today or you may call back ([phone]) and schedule an appointment.  Please make sure when you call that you mention that you are scheduling your Annual Wellness Visit with the clinical pharmacist so that the appointment may be made for the proper length of time.     Continue current medications. Continue good therapeutic lifestyle changes which include good diet and exercise. Fall precautions discussed with patient. If an FOBT was given today- please return it to our front desk. If you are over 3 years old - you may need Prevnar 27 or the adult Pneumonia vaccine.  **Flu shots are available--- please call and schedule a FLU-CLINIC appointment**  After your visit with [place] today you will receive a survey in the mail or online from [employer] regarding your care with [place]. Please take a moment to fill this out. Your feedback is very important to [place] as you can help [place] better understand your patient needs as well as improve your experience and satisfaction. WE CARE ABOUT YOU!!!   Practice good pulmonary and respiratory hygiene along with hand hygiene. Wash hands regularly Stay out of crowds of people Continue with current medication Drink plenty of fluids and stay well-hydrated Follow-up with eye doctors at [facility] as recommended and with endocrinology if needed  Arrie Senate MD

## 2018-08-30 ENCOUNTER — Encounter: Payer: Self-pay | Admitting: Family Medicine

## 2018-08-30 ENCOUNTER — Ambulatory Visit (INDEPENDENT_AMBULATORY_CARE_PROVIDER_SITE_OTHER): Payer: Medicare HMO | Admitting: Family Medicine

## 2018-08-30 ENCOUNTER — Other Ambulatory Visit: Payer: Self-pay

## 2018-08-30 DIAGNOSIS — J0110 Acute frontal sinusitis, unspecified: Secondary | ICD-10-CM | POA: Diagnosis not present

## 2018-08-30 MED ORDER — AMOXICILLIN-POT CLAVULANATE 875-125 MG PO TABS: 1.0000 | ORAL_TABLET | Freq: Two times a day (BID) | ORAL | 0 refills | Status: AC

## 2018-08-30 NOTE — Progress Notes (Signed)
Virtual Visit via telephone Note  I connected with Mario Smith on 08/30/18 at 0835 by telephone and verified that I am speaking with the correct person using two identifiers. Mario Smith is currently located at home and family is currently with them during visit. The provider, Monia Pouch, FNP is located in their office at time of visit.  I discussed the limitations, risks, security and privacy concerns of performing an evaluation and management service by telephone and the availability of in person appointments. I also discussed with the patient that there may be a patient responsible charge related to this service. The patient expressed understanding and agreed to proceed.  Subjective:  Patient ID: Mario Smith, male    DOB: 08/02/1940, 78 y.o.   MRN: [id number]  Chief Complaint:  Sinus problem  HPI: Mario Smith is a 78 y.o. male presenting on 08/30/2018 for sinus problem  Pt reports ongoing sinus pressure and burning with nasal congestion, sore throat, and ear pressure. Pt states this started several days ago and is getting worse. Pt states he has been using Flonase, tussin, and warm salt water gargles without relief of symptoms. He denies fever, chills, or cough. No shortness of breath, chest pain, or weakness. No dizziness or syncope. No recent travel or know sick exposures.    Relevant past medical, surgical, family, and social history reviewed and updated as indicated.  Allergies and medications reviewed and updated.   Past Medical History:  Diagnosis Date  . Abdominal pain 10/10/2017  . Arthritis   . Back pain   . Colon polyp 2011   tubular adenoma.  no HG dysplasia.    . Constipation   . COPD (chronic obstructive pulmonary disease) ([place])   . Dehydration 10/10/2017  . Dyspnea    with exertion  . ED (erectile dysfunction)   . Essential hypertension, benign   . Gallbladder sludge 10/2017  . GERD (gastroesophageal reflux disease)    acute gastritis  . Graves  disease   . History of kidney stones   . HTN (hypertension)   . Other and unspecified hyperlipidemia   . PONV (postoperative nausea and vomiting)    Pt's wife also said that when he had a nerve block in 2015 here, pt had a reaction immediately. States pt got very shaky, lost his voice.,Pt does not want a nerve block again  . Thrombocytopenia ([place])   . Vertigo     Past Surgical History:  Procedure Laterality Date  . CATARACT EXTRACTION Bilateral   . CHOLECYSTECTOMY N/A 01/18/2018   Procedure: LAPAROSCOPIC CHOLECYSTECTOMY WITH INTRAOPERATIVE CHOLANGIOGRAM ERAS PATHWAY;  Surgeon: Jovita Kussmaul, MD;  Location: [facility];  Service: General;  Laterality: N/A;  . COLONOSCOPY  03/2010    Dr Thomasenia Sales, [facility]. Higher risk screening study, sister's hx of colon CA.    Marland Kitchen ESOPHAGOGASTRODUODENOSCOPY (EGD) WITH PROPOFOL N/A 10/11/2017   Procedure: ESOPHAGOGASTRODUODENOSCOPY (EGD) WITH PROPOFOL;  Surgeon: Mauri Pole, MD;  Location: [facility];  Service: Endoscopy;  Laterality: N/A;  . EYE SURGERY     lid-lift  . SPINAL FUSION  2001   cervical  . TENDON REPAIR Right 05/16/2014   Procedure: RIGHT RING FINGER FLEXOR TENDON REPAIR WITH PULL OUT BUTTON;  Surgeon: Roseanne Kaufman, MD;  Location: [facility];  Service: Orthopedics;  Laterality: Right;    Social History   Socioeconomic History  . Marital status: Married    Spouse name: Not on file  . Number of children: Not  on file  . Years of education: Not on file  . Highest education level: Not on file  Occupational History  . Not on file  Social Needs  . Financial resource strain: Not on file  . Food insecurity:    Worry: Not on file    Inability: Not on file  . Transportation needs:    Medical: Not on file    Non-medical: Not on file  Tobacco Use  . Smoking status: Former Smoker    Types: Cigarettes    Last attempt to quit: 12/19/1992    Years since quitting: 25.7  . Smokeless tobacco: Never Used  Substance and  Sexual Activity  . Alcohol use: Not Currently  . Drug use: No  . Sexual activity: Not Currently  Lifestyle  . Physical activity:    Days per week: Not on file    Minutes per session: Not on file  . Stress: Not on file  Relationships  . Social connections:    Talks on phone: Not on file    Gets together: Not on file    Attends religious service: Not on file    Active member of club or organization: Not on file    Attends meetings of clubs or organizations: Not on file    Relationship status: Not on file  . Intimate partner violence:    Fear of current or ex partner: Not on file    Emotionally abused: Not on file    Physically abused: Not on file    Forced sexual activity: Not on file  Other Topics Concern  . Not on file  Social History Narrative  . Not on file    Outpatient Encounter Medications as of 08/30/2018  Medication Sig  . amoxicillin-clavulanate (AUGMENTIN) 875-125 MG tablet Take 1 tablet by mouth 2 (two) times daily for 7 days.  Marland Kitchen aspirin 81 MG EC tablet Take 81 mg by mouth daily.    . carboxymethylcellulose (REFRESH PLUS) 0.5 % SOLN Place 1 drop into both eyes as needed (dry eyes).  . Cholecalciferol (VITAMIN D3) 2000 UNITS TABS Take 2,000 Units by mouth daily.   . Coenzyme Q10 (CO Q 10) 100 MG CAPS Take 100 mg by mouth daily.   . fluticasone (FLONASE) 50 MCG/ACT nasal spray SPRAY 2 SPRAYS INTO EACH NOSTRIL EVERY DAY  . hydrochlorothiazide (MICROZIDE) 12.5 MG capsule Take 2 capsules (25 mg total) by mouth daily. (Patient taking differently: Take 12.5 mg by mouth 2 (two) times daily. )  . methimazole (TAPAZOLE) 5 MG tablet Take 5 mg by mouth every Monday, Wednesday, and Friday.   . metoprolol tartrate (LOPRESSOR) 25 MG tablet TAKE 1 TABLET BY MOUTH TWICE A DAY  . Omega-3 Fatty Acids (FISH OIL) 1000 MG CAPS Take 1,000 mg by mouth daily.   . pantoprazole (PROTONIX) 40 MG tablet Take 1 tablet (40 mg total) by mouth 2 (two) times daily. (Patient taking differently: Take 40  mg by mouth daily. )  . rosuvastatin (CRESTOR) 10 MG tablet Take 1 tablet (10 mg total) by mouth daily.  Marland Kitchen selenium 200 MCG TABS tablet Take 1 tablet (200 mcg total) by mouth daily.  Marland Kitchen telmisartan (MICARDIS) 80 MG tablet Take 0.5 tablets (40 mg total) by mouth daily.   Facility-Administered Encounter Medications as of 08/30/2018  Medication  . cyanocobalamin ((VITAMIN B-12)) injection 1,000 mcg    Allergies  Allergen Reactions  . Simvastatin Other (See Comments)    Leg cramps  . Sulfa Antibiotics     UNSPECIFIED  REACTION   . Codeine Nausea And Vomiting  . Hydrocodone Nausea And Vomiting  . Meclizine Nausea Only  . Niacin And Related Other (See Comments)    Flushing in the face and redness   . Ondansetron Other (See Comments)    Tremors   . Phenergan [Promethazine Hcl] Other (See Comments)    Tremors     Review of Systems  Constitutional: Negative for chills, fatigue and fever.  HENT: Positive for congestion, ear pain, postnasal drip, rhinorrhea, sinus pressure, sinus pain and sore throat. Negative for sneezing and trouble swallowing.   Respiratory: Negative for cough, chest tightness, shortness of breath and wheezing.   Cardiovascular: Negative for chest pain, palpitations and leg swelling.  Neurological: Positive for headaches. Negative for dizziness, weakness, light-headedness and numbness.  Psychiatric/Behavioral: Negative for confusion.  All other systems reviewed and are negative.        Observations/Objective: Pt alert and oriented, answers all questions appropriately, and able to speak in full sentences.   Assessment and Plan: Diagnoses and all orders for this visit:  Acute non-recurrent frontal sinusitis Symptomatic care discussed. Continue Flonase and frequent saline nasal sprays. Report any new or worsening symptoms.  -     amoxicillin-clavulanate (AUGMENTIN) 875-125 MG tablet; Take 1 tablet by mouth 2 (two) times daily for 7 days.     Follow Up  Instructions: Report any new or worsening symptoms.     I discussed the assessment and treatment plan with the patient. The patient was provided an opportunity to ask questions and all were answered. The patient agreed with the plan and demonstrated an understanding of the instructions.   The patient was advised to call back or seek an in-person evaluation if the symptoms worsen or if the condition fails to improve as anticipated.  The above assessment and management plan was discussed with the patient. The patient verbalized understanding of and has agreed to the management plan. Patient is aware to call the clinic if symptoms persist or worsen. Patient is aware when to return to the clinic for a follow-up visit. Patient educated on when it is appropriate to go to the emergency department.    I provided 15 minutes of non-face-to-face time during this encounter. The call started at 0835. The call ended at 6.   Monia Pouch, FNP-C [facility] [address] [place], [place] [address] [phone]

## 2018-09-13 ENCOUNTER — Ambulatory Visit: Payer: Medicare HMO

## 2018-09-17 ENCOUNTER — Ambulatory Visit (INDEPENDENT_AMBULATORY_CARE_PROVIDER_SITE_OTHER): Payer: Medicare HMO | Admitting: *Deleted

## 2018-09-17 ENCOUNTER — Other Ambulatory Visit: Payer: Self-pay

## 2018-09-17 DIAGNOSIS — E538 Deficiency of other specified B group vitamins: Secondary | ICD-10-CM | POA: Diagnosis not present

## 2018-10-12 ENCOUNTER — Other Ambulatory Visit: Payer: Self-pay

## 2018-10-15 ENCOUNTER — Ambulatory Visit (INDEPENDENT_AMBULATORY_CARE_PROVIDER_SITE_OTHER): Payer: Medicare HMO | Admitting: *Deleted

## 2018-10-15 ENCOUNTER — Other Ambulatory Visit: Payer: Self-pay

## 2018-10-15 DIAGNOSIS — E538 Deficiency of other specified B group vitamins: Secondary | ICD-10-CM | POA: Diagnosis not present

## 2018-10-15 MED ORDER — CYANOCOBALAMIN 1000 MCG/ML IJ SOLN
1000.0000 ug | INTRAMUSCULAR | Status: AC
  Administered 2018-10-15 – 2019-09-23 (×12): 1000 ug via INTRAMUSCULAR

## 2018-10-15 NOTE — Progress Notes (Signed)
Pt given Cyanocobalamin inj Tolerated well 

## 2018-11-16 ENCOUNTER — Other Ambulatory Visit: Payer: Self-pay

## 2018-11-16 ENCOUNTER — Ambulatory Visit (INDEPENDENT_AMBULATORY_CARE_PROVIDER_SITE_OTHER): Payer: Medicare HMO | Admitting: *Deleted

## 2018-11-16 DIAGNOSIS — E538 Deficiency of other specified B group vitamins: Secondary | ICD-10-CM

## 2018-11-16 NOTE — Progress Notes (Signed)
Pt given Cyanocobalamin inj Tolerated well 

## 2018-11-25 IMAGING — NM NM HEPATO W/GB/PHARM/[PERSON_NAME]
3 series · 18 of 18 positions shown · non-contrast
Comparison: CT, 10/10/2017.

CLINICAL DATA: Patient with approximately 2 month history of
intermittent but frequent episodes of vertigo, associated with
nausea. There or also episodes of vomiting. No reported abdominal
pain.

EXAM:
NUCLEAR MEDICINE HEPATOBILIARY IMAGING WITH GALLBLADDER EF
TECHNIQUE: Sequential images of the abdomen were obtained [DATE] minutes
following intravenous administration of radiopharmaceutical. After
oral ingestion of Ensure, gallbladder ejection fraction was
determined. At 60 min, normal ejection fraction is greater than 33%.
RADIOPHARMACEUTICALS:  4.7 mCi Vc-KKm  Choletec IV

[Series 1: biliary · 4.14mm/px · 6 of 60 frames shown]
[frame 6/60]
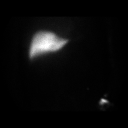
[frame 16/60]
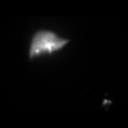
[frame 26/60]
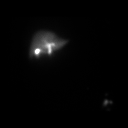
[frame 36/60]
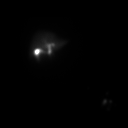
[frame 46/60]
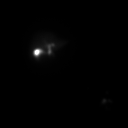
[frame 56/60]
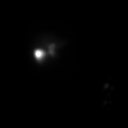

[Series 1: dynamic - (id) - gbef - hida_motion_corrected moti · 4.14mm/px · 6 of 60 frames shown]
[frame 6/60]
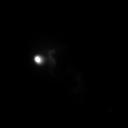
[frame 16/60]
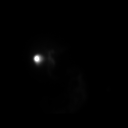
[frame 26/60]
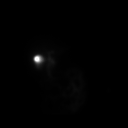
[frame 36/60]
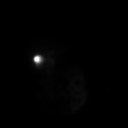
[frame 46/60]
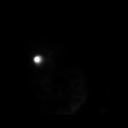
[frame 56/60]
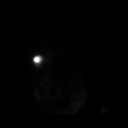

[Series 2: gbef · 4.14mm/px · 6 of 60 frames shown]
[frame 6/60]
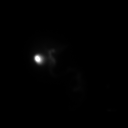
[frame 16/60]
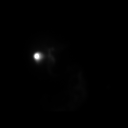
[frame 26/60]
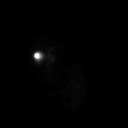
[frame 36/60]
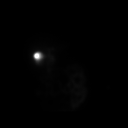
[frame 46/60]
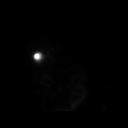
[frame 56/60]
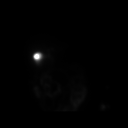

[18 of 18 positions shown; findings below may reference images not displayed]

Ultrasound, 10/09/2017. Ultrasound
demonstrated gallbladder sludge with borderline wall thickening.
FINDINGS: Prompt uptake and biliary excretion of activity by the liver is
seen. Gallbladder activity is visualized, consistent with patency of
cystic duct. Biliary activity passes into small bowel, consistent
with patent common bile duct.

Additional imaging was performed following the ingestion of Ensure
to promote gallbladder contraction. Calculated gallbladder ejection
fraction is 9%. (Normal gallbladder ejection fraction with Ensure is
greater than 33%.)
IMPRESSION: 1. No evidence of acute cholecystitis. Patent cystic and common bile
ducts. Normal appearance of the liver.
2. And normal gallbladder function with a reduced ejection fraction
of 9%.

## 2018-11-25 IMAGING — CT CT ABD-PELV W/ CM
2 of 5 series · 16 of 46 positions shown, 18 images · IV contrast (omnipaque)
Comparison: Abdominal ultrasound from 10/09/2017

CLINICAL DATA: The patient states he has not been feeling well for
over a month. Abdominal pain with loss of appetite.

EXAM:
CT ABDOMEN AND PELVIS WITH CONTRAST
TECHNIQUE: Multidetector CT imaging of the abdomen and pelvis was performed
using the standard protocol following bolus administration of
intravenous contrast.
CONTRAST:  100mL OMNIPAQUE IOHEXOL 300 MG/ML  SOLN

[Series 3: a/p w/ 5mm · axial · 0.78mm/px · z∈[-526,-96]mm · 13 of 96 slices shown, 15 images]
[im 5/96  soft-tissue]
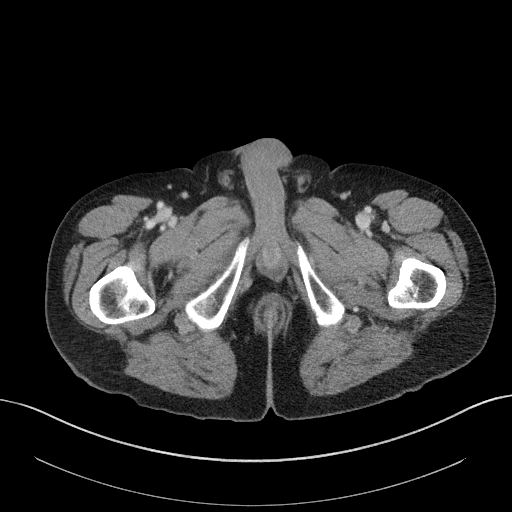
[im 5/96  bone]
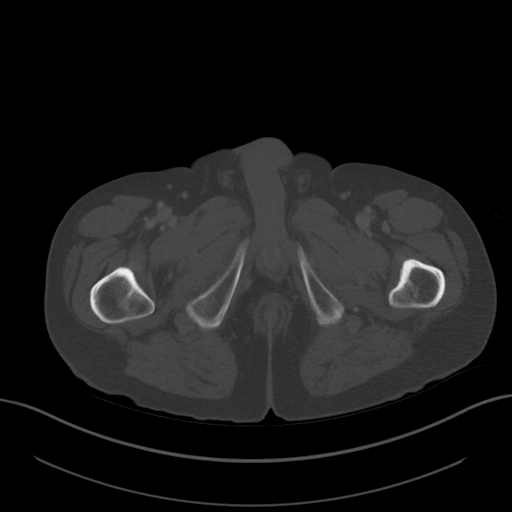
[im 15/96  soft-tissue]
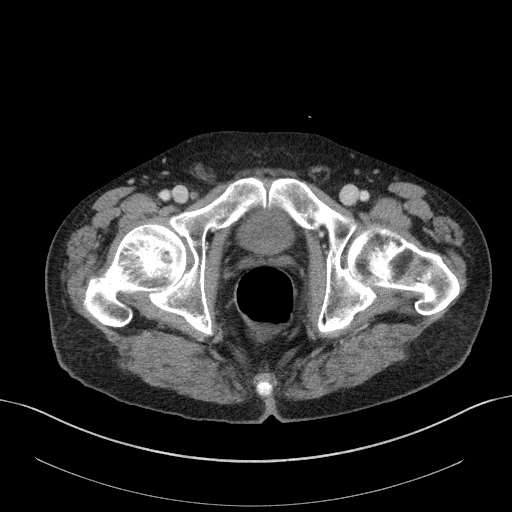
[im 20/96  soft-tissue]
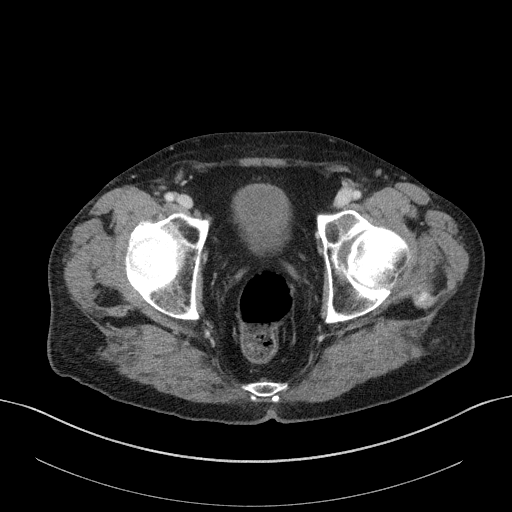
[im 29/96  soft-tissue]
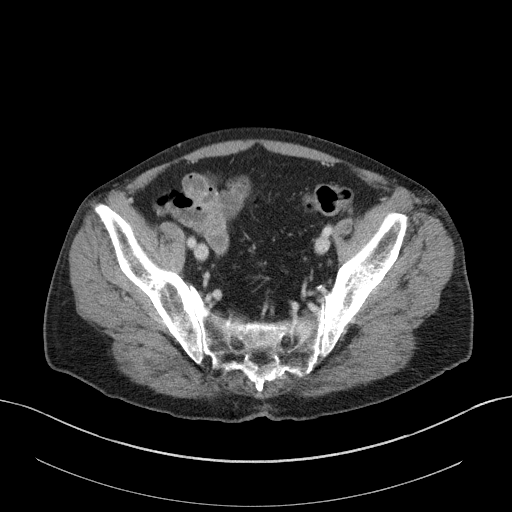
[im 34/96  soft-tissue]
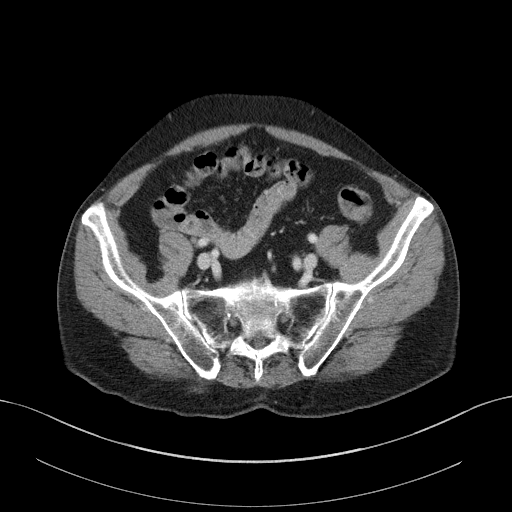
[im 43/96  soft-tissue]
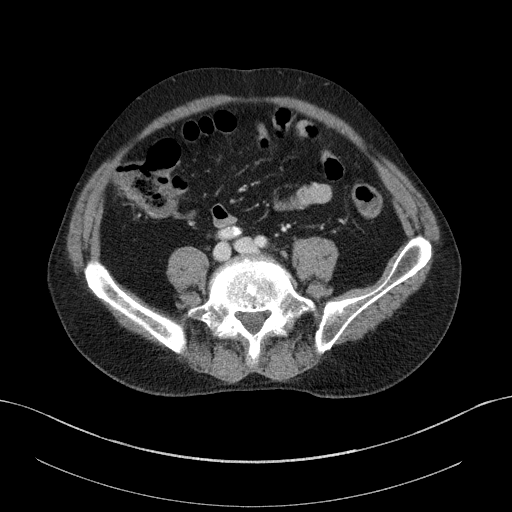
[im 48/96  soft-tissue]
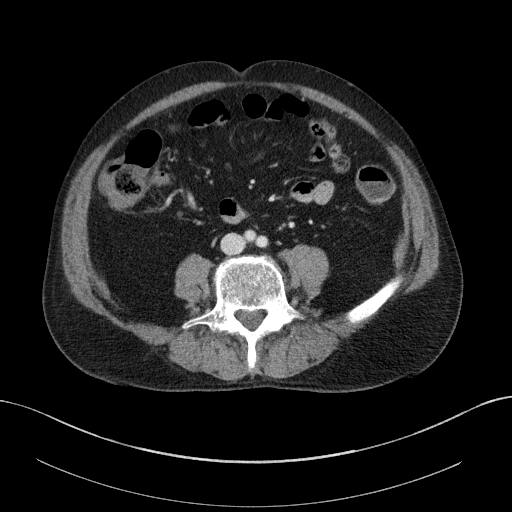
[im 53/96  soft-tissue]
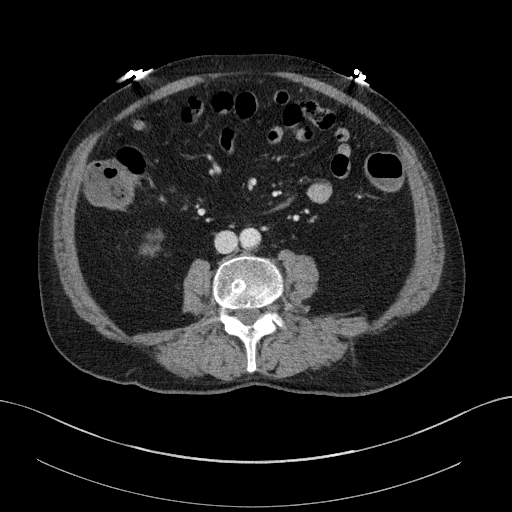
[im 62/96  soft-tissue]
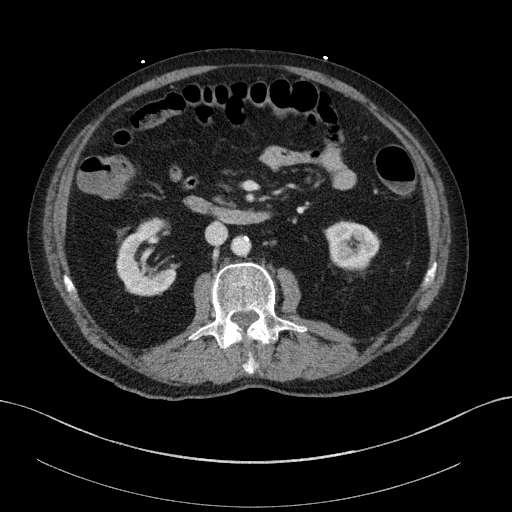
[im 62/96  bone]
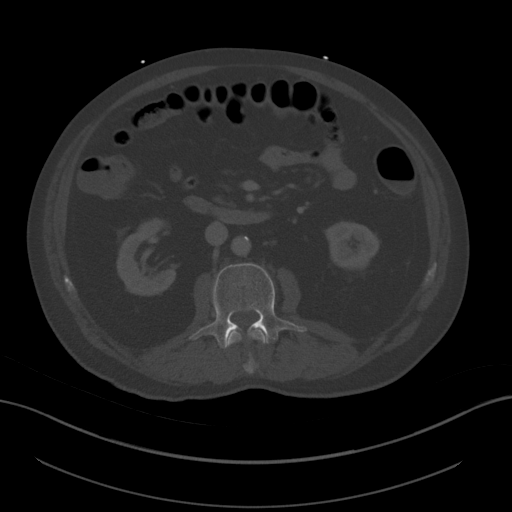
[im 67/96  soft-tissue]
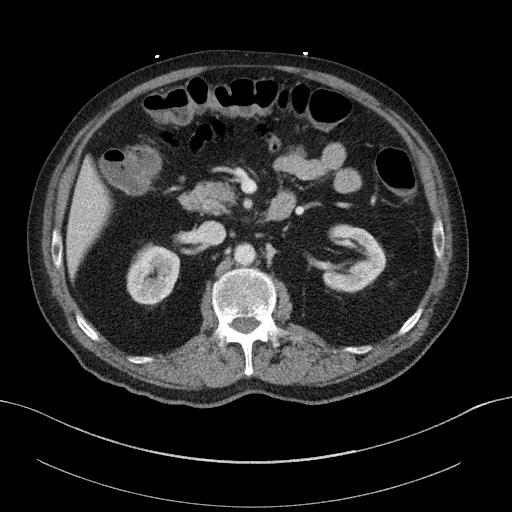
[im 77/96  soft-tissue]
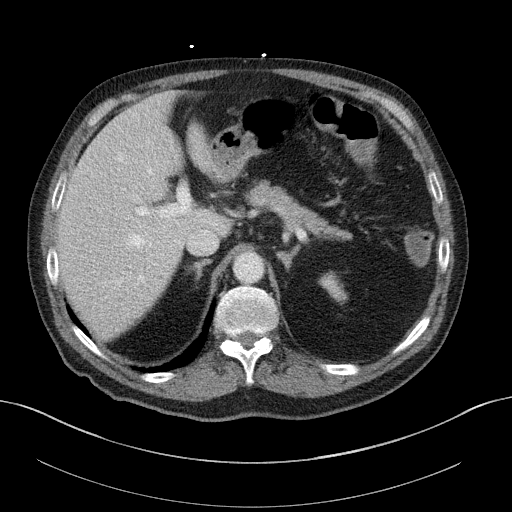
[im 81/96  soft-tissue]
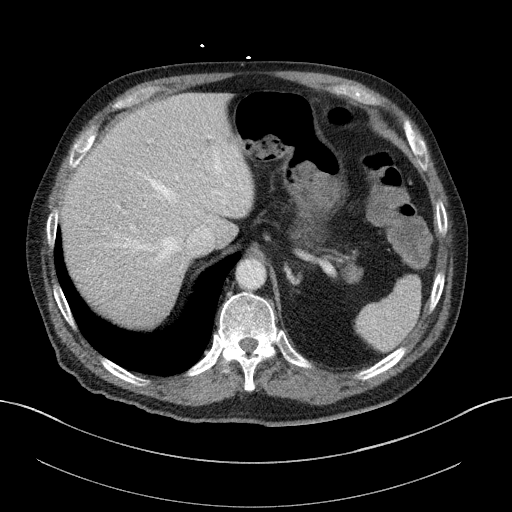
[im 91/96  soft-tissue]
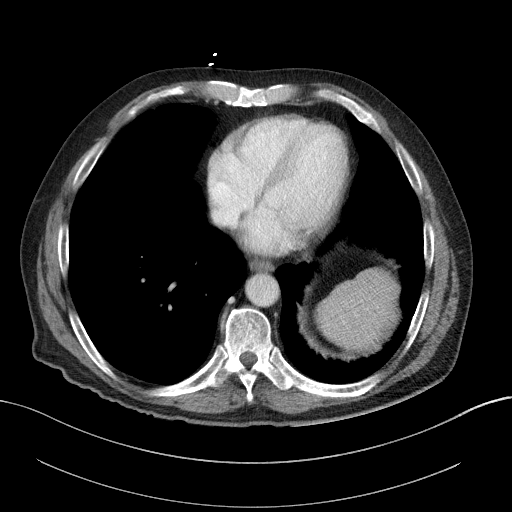

[Series 6: a/p w/ cor · coronal · 0.75mm/px · 3 of 136 slices shown]
[im 46/136  soft-tissue]
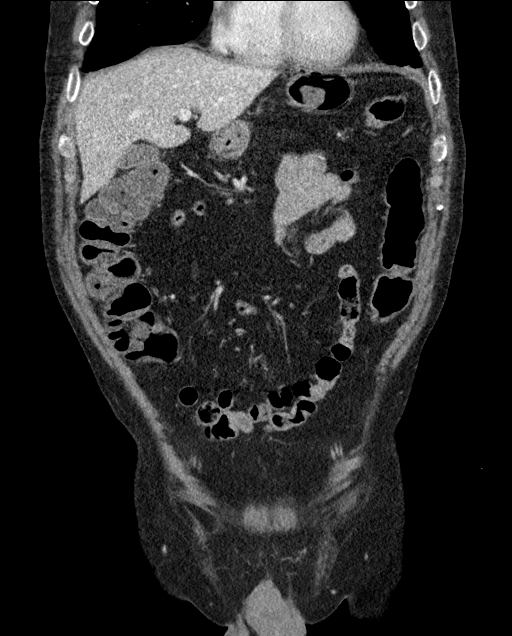
[im 61/136  soft-tissue]
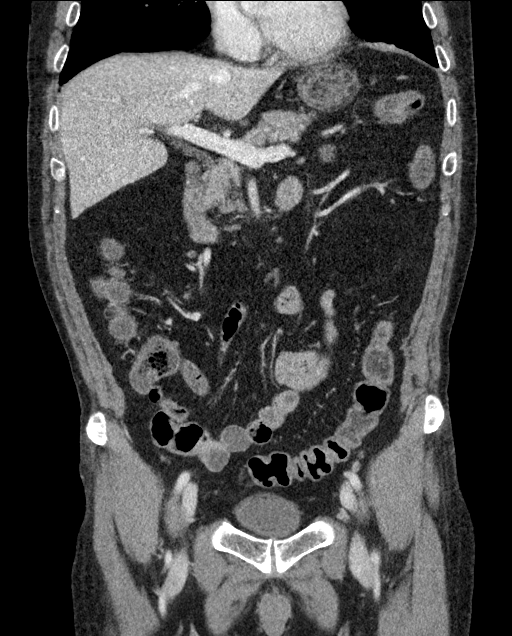
[im 76/136  soft-tissue]
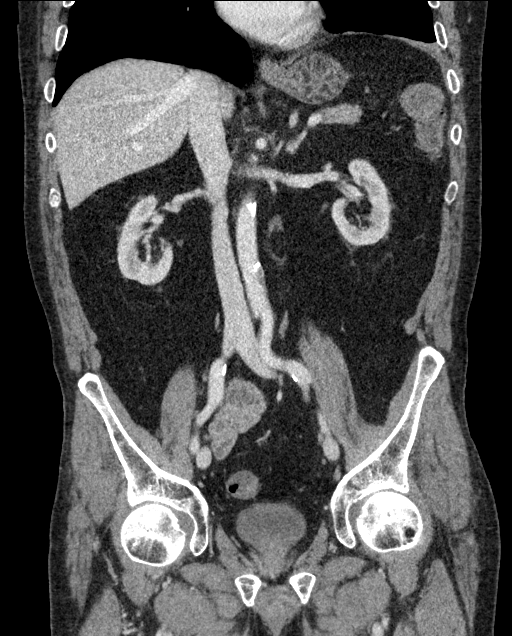

[16 of 46 positions shown; findings below may reference images not displayed]

FINDINGS: Lower chest: The included heart is normal in size without
pericardial effusion. No active pulmonary disease.

Hepatobiliary: No focal liver abnormality is seen. No gallstones,
gallbladder wall thickening, or biliary dilatation. Probable minimal
biliary sludge within. The gallbladder is nondistended in
appearance.

Pancreas: Unremarkable. No pancreatic ductal dilatation or
surrounding inflammatory changes.

Spleen: Normal in size without focal abnormality.

Adrenals/Urinary Tract: Adrenal glands are unremarkable. Kidneys are
normal, without renal calculi, focal lesion, or hydronephrosis.
Bladder is unremarkable.

Stomach/Bowel: Stomach is within normal limits. Appendix appears
normal. No evidence of bowel wall thickening, distention, or
inflammatory changes.

Vascular/Lymphatic: Moderate aortoiliac atherosclerosis. Patent
branch vessels. No aneurysm. No lymphadenopathy.

Reproductive: Normal size prostate with central zone calcifications.

Other: No ascites or free air.

Musculoskeletal: Marked degenerative disc flattening and vacuum disc
phenomena from L4 through S1. No pars defects or listhesis. Lower
lumbar degenerative facet arthropathy is identified. Bones are
demineralized in appearance. Right SI joint osteoarthritis with
sclerosis.
IMPRESSION: Lower lumbar degenerative disc and facet arthropathy.

No acute solid nor hollow visceral organ abnormality.

## 2018-12-14 ENCOUNTER — Other Ambulatory Visit: Payer: Self-pay

## 2018-12-17 ENCOUNTER — Other Ambulatory Visit: Payer: Self-pay

## 2018-12-17 ENCOUNTER — Ambulatory Visit (INDEPENDENT_AMBULATORY_CARE_PROVIDER_SITE_OTHER): Payer: Medicare HMO | Admitting: *Deleted

## 2018-12-17 DIAGNOSIS — E538 Deficiency of other specified B group vitamins: Secondary | ICD-10-CM

## 2018-12-17 NOTE — Progress Notes (Signed)
Pt given Cyanocobalamin inj Tolerated well 

## 2018-12-31 ENCOUNTER — Other Ambulatory Visit: Payer: Self-pay | Admitting: Family Medicine

## 2019-01-01 ENCOUNTER — Other Ambulatory Visit: Payer: Medicare HMO

## 2019-01-01 ENCOUNTER — Other Ambulatory Visit: Payer: Self-pay

## 2019-01-01 ENCOUNTER — Ambulatory Visit: Payer: Medicare HMO | Admitting: Family Medicine

## 2019-01-01 DIAGNOSIS — I10 Essential (primary) hypertension: Secondary | ICD-10-CM | POA: Diagnosis not present

## 2019-01-01 DIAGNOSIS — E0590 Thyrotoxicosis, unspecified without thyrotoxic crisis or storm: Secondary | ICD-10-CM

## 2019-01-01 DIAGNOSIS — E7800 Pure hypercholesterolemia, unspecified: Secondary | ICD-10-CM | POA: Diagnosis not present

## 2019-01-02 ENCOUNTER — Encounter: Payer: Self-pay | Admitting: Family Medicine

## 2019-01-02 ENCOUNTER — Other Ambulatory Visit: Payer: Self-pay

## 2019-01-02 ENCOUNTER — Ambulatory Visit (INDEPENDENT_AMBULATORY_CARE_PROVIDER_SITE_OTHER): Payer: Medicare HMO | Admitting: Family Medicine

## 2019-01-02 VITALS — BP 114/69 | HR 77 | Temp 98.6°F | Ht 67.0 in | Wt 176.4 lb

## 2019-01-02 DIAGNOSIS — E7800 Pure hypercholesterolemia, unspecified: Secondary | ICD-10-CM | POA: Diagnosis not present

## 2019-01-02 DIAGNOSIS — K219 Gastro-esophageal reflux disease without esophagitis: Secondary | ICD-10-CM

## 2019-01-02 DIAGNOSIS — I10 Essential (primary) hypertension: Secondary | ICD-10-CM

## 2019-01-02 DIAGNOSIS — E0590 Thyrotoxicosis, unspecified without thyrotoxic crisis or storm: Secondary | ICD-10-CM

## 2019-01-02 LAB — CBC WITH DIFFERENTIAL/PLATELET
Basophils Absolute: 0 10*3/uL (ref 0.0–0.2)
Basos: 1 %
EOS (ABSOLUTE): 0.2 10*3/uL (ref 0.0–0.4)
Eos: 3 %
Hematocrit: 45.8 % (ref 37.5–51.0)
Hemoglobin: 15.1 g/dL (ref 13.0–17.7)
Immature Grans (Abs): 0 10*3/uL (ref 0.0–0.1)
Immature Granulocytes: 0 %
Lymphocytes Absolute: 1.7 10*3/uL (ref 0.7–3.1)
Lymphs: 31 %
MCH: 30.7 pg (ref 26.6–33.0)
MCHC: 33 g/dL (ref 31.5–35.7)
MCV: 93 fL (ref 79–97)
Monocytes Absolute: 0.6 10*3/uL (ref 0.1–0.9)
Monocytes: 11 %
Neutrophils Absolute: 3 10*3/uL (ref 1.4–7.0)
Neutrophils: 54 %
Platelets: 140 10*3/uL — ABNORMAL LOW (ref 150–450)
RBC: 4.92 x10E6/uL (ref 4.14–5.80)
RDW: 13 % (ref 11.6–15.4)
WBC: 5.5 10*3/uL (ref 3.4–10.8)

## 2019-01-02 LAB — LIPID PANEL
Chol/HDL Ratio: 3.3 ratio (ref 0.0–5.0)
Cholesterol, Total: 127 mg/dL (ref 100–199)
HDL: 39 mg/dL — ABNORMAL LOW (ref >39)
LDL Calculated: 69 mg/dL (ref 0–99)
Triglycerides: 97 mg/dL (ref 0–149)
VLDL Cholesterol Cal: 19 mg/dL (ref 5–40)

## 2019-01-02 LAB — BMP8+EGFR
BUN/Creatinine Ratio: 13 (ref 10–24)
BUN: 16 mg/dL (ref 8–27)
CO2: 23 mmol/L (ref 20–29)
Calcium: 9.4 mg/dL (ref 8.6–10.2)
Chloride: 104 mmol/L (ref 96–106)
Creatinine, Ser: 1.28 mg/dL — ABNORMAL HIGH (ref 0.76–1.27)
GFR calc Af Amer: 62 mL/min/{1.73_m2} (ref >59)
GFR calc non Af Amer: 53 mL/min/{1.73_m2} — ABNORMAL LOW (ref >59)
Glucose: 102 mg/dL — ABNORMAL HIGH (ref 65–99)
Potassium: 4 mmol/L (ref 3.5–5.2)
Sodium: 143 mmol/L (ref 134–144)

## 2019-01-02 LAB — TSH: TSH: 2.88 u[IU]/mL (ref 0.450–4.500)

## 2019-01-02 MED ORDER — TELMISARTAN 40 MG PO TABS: 20.0000 mg | ORAL_TABLET | Freq: Every day | ORAL | 3 refills | Status: DC

## 2019-01-02 MED ORDER — PANTOPRAZOLE SODIUM 40 MG PO TBEC: 40.0000 mg | DELAYED_RELEASE_TABLET | Freq: Two times a day (BID) | ORAL | 3 refills | Status: DC

## 2019-01-02 NOTE — Progress Notes (Signed)
 BP 114/69   Pulse 77   Temp 98.6 F (37 C) (Temporal)   Ht 5' 7" (1.702 m)   Wt 176 lb 6.4 oz (80 kg)   BMI 27.63 kg/m    Subjective:   Patient ID: Mario Smith, male    DOB: 12/05/1940, 77 y.o.   MRN: [id number]  HPI: Mario Smith is a 77 y.o. male presenting on 01/02/2019 for Establish Care and Medical Management of Chronic Issues (4 month follow up)   HPI Hyperlipidemia Patient is coming in for recheck of his hyperlipidemia. The patient is currently taking Crestor and fish oils. They deny any issues with myalgias or history of liver damage from it. They deny any focal numbness or weakness or chest pain.   Hypertension Patient is currently on telmisartan and metoprolol and hydrochlorothiazide, and their blood pressure today is 114/69. Patient denies any lightheadedness or dizziness. Patient denies headaches, blurred vision, chest pains, shortness of breath, or weakness. Denies any side effects from medication and is content with current medication.   Hypothyroidism recheck Patient is coming in for thyroid recheck today as well. They deny any issues with hair changes or heat or cold problems or diarrhea or constipation. They deny any chest pain or palpitations. They are currently on methimazole  GERD Patient is currently on Pantoloc.  She denies any major symptoms or abdominal pain or belching or burping. She denies any blood in her stool or lightheadedness or dizziness.   Relevant past medical, surgical, family and social history reviewed and updated as indicated. Interim medical history since our last visit reviewed. Allergies and medications reviewed and updated.  Review of Systems  Constitutional: Negative for chills and fever.  Eyes: Negative for visual disturbance.  Respiratory: Negative for shortness of breath and wheezing.   Cardiovascular: Negative for chest pain and leg swelling.  Musculoskeletal: Negative for back pain and gait problem.  Skin: Negative for rash.   Neurological: Negative for dizziness, weakness, light-headedness and headaches.  All other systems reviewed and are negative.   Per HPI unless specifically indicated above   Allergies as of 01/02/2019      Reactions   Simvastatin Other (See Comments)   Leg cramps   Sulfa Antibiotics    UNSPECIFIED REACTION    Codeine Nausea And Vomiting   Hydrocodone Nausea And Vomiting   Meclizine Nausea Only   Niacin And Related Other (See Comments)   Flushing in the face and redness    Ondansetron Other (See Comments)   Tremors   Phenergan [promethazine Hcl] Other (See Comments)   Tremors      Medication List       Accurate as of January 02, 2019  1:29 PM. If you have any questions, ask your nurse or doctor.        aspirin 81 MG EC tablet Take 81 mg by mouth daily.   carboxymethylcellulose 0.5 % Soln Commonly known as: REFRESH PLUS Place 1 drop into both eyes as needed (dry eyes).   Co Q 10 100 MG Caps Take 100 mg by mouth daily.   Fish Oil 1000 MG Caps Take 1,000 mg by mouth daily.   fluticasone 50 MCG/ACT nasal spray Commonly known as: FLONASE SPRAY 2 SPRAYS INTO EACH NOSTRIL EVERY DAY   hydrochlorothiazide 12.5 MG capsule Commonly known as: MICROZIDE Take 2 capsules (25 mg total) by mouth daily. What changed:   how much to take  when to take this   methimazole 5 MG tablet Commonly known   as: TAPAZOLE Take 5 mg by mouth every Monday, Wednesday, and Friday.   metoprolol tartrate 25 MG tablet Commonly known as: LOPRESSOR TAKE 1 TABLET BY MOUTH TWICE A DAY   pantoprazole 40 MG tablet Commonly known as: PROTONIX Take 1 tablet (40 mg total) by mouth 2 (two) times daily. What changed: when to take this   rosuvastatin 10 MG tablet Commonly known as: CRESTOR Take 1 tablet (10 mg total) by mouth daily.   selenium 200 MCG Tabs tablet Take 1 tablet (200 mcg total) by mouth daily.   telmisartan 40 MG tablet Commonly known as: MICARDIS Take 0.5 tablets (20 mg total)  by mouth daily. What changed:   medication strength  how much to take Changed by: Worthy Rancher, MD   Vitamin D3 50 MCG (2000 UT) Tabs Take 2,000 Units by mouth daily.        Objective:   BP 114/69   Pulse 77   Temp 98.6 F (37 C) (Temporal)   Ht 5' 7" (1.702 m)   Wt 176 lb 6.4 oz (80 kg)   BMI 27.63 kg/m   Wt Readings from Last 3 Encounters:  01/02/19 176 lb 6.4 oz (80 kg)  08/22/18 179 lb (81.2 kg)  06/01/18 176 lb 3.2 oz (79.9 kg)    Physical Exam Vitals signs and nursing note reviewed.  Constitutional:      General: He is not in acute distress.    Appearance: He is well-developed. He is not diaphoretic.  Eyes:     General: No scleral icterus.       Right eye: Discharge present.     Conjunctiva/sclera: Conjunctivae normal.  Neck:     Musculoskeletal: Neck supple.     Thyroid: No thyromegaly.  Cardiovascular:     Rate and Rhythm: Normal rate and regular rhythm.     Heart sounds: Normal heart sounds. No murmur.  Pulmonary:     Effort: Pulmonary effort is normal. No respiratory distress.     Breath sounds: Normal breath sounds. No wheezing.  Musculoskeletal: Normal range of motion.  Lymphadenopathy:     Cervical: No cervical adenopathy.  Skin:    General: Skin is warm and dry.     Findings: No rash.  Neurological:     Mental Status: He is alert and oriented to person, place, and time.     Coordination: Coordination normal.  Psychiatric:        Behavior: Behavior normal.     Results for orders placed or performed in visit on 01/01/19  [facility]  Result Value Ref Range   Glucose 102 (H) 65 - 99 mg/dL   BUN 16 8 - 27 mg/dL   Creatinine, Ser 1.28 (H) 0.76 - 1.27 mg/dL   GFR calc non Af Amer 53 (L) >59 mL/min/1.73   GFR calc Af Amer 62 >59 mL/min/1.73   BUN/Creatinine Ratio 13 10 - 24   Sodium 143 134 - 144 mmol/L   Potassium 4.0 3.5 - 5.2 mmol/L   Chloride 104 96 - 106 mmol/L   CO2 23 20 - 29 mmol/L   Calcium 9.4 8.6 - 10.2 mg/dL  CBC with  Differential/Platelet  Result Value Ref Range   WBC 5.5 3.4 - 10.8 x10E3/uL   RBC 4.92 4.14 - 5.80 x10E6/uL   Hemoglobin 15.1 13.0 - 17.7 g/dL   Hematocrit 45.8 37.5 - 51.0 %   MCV 93 79 - 97 fL   MCH 30.7 26.6 - 33.0 pg   MCHC 33.0 31.5 -  35.7 g/dL   RDW 13.0 11.6 - 15.4 %   Platelets 140 (L) 150 - 450 x10E3/uL   Neutrophils 54 Not Estab. %   Lymphs 31 Not Estab. %   Monocytes 11 Not Estab. %   Eos 3 Not Estab. %   Basos 1 Not Estab. %   Neutrophils Absolute 3.0 1.4 - 7.0 x10E3/uL   Lymphocytes Absolute 1.7 0.7 - 3.1 x10E3/uL   Monocytes Absolute 0.6 0.1 - 0.9 x10E3/uL   EOS (ABSOLUTE) 0.2 0.0 - 0.4 x10E3/uL   Basophils Absolute 0.0 0.0 - 0.2 x10E3/uL   Immature Granulocytes 0 Not Estab. %   Immature Grans (Abs) 0.0 0.0 - 0.1 x10E3/uL  Lipid panel  Result Value Ref Range   Cholesterol, Total 127 100 - 199 mg/dL   Triglycerides 97 0 - 149 mg/dL   HDL 39 (L) >39 mg/dL   VLDL Cholesterol Cal 19 5 - 40 mg/dL   LDL Calculated 69 0 - 99 mg/dL   Chol/HDL Ratio 3.3 0.0 - 5.0 ratio  TSH  Result Value Ref Range   TSH 2.880 0.450 - 4.500 uIU/mL    Assessment & Plan:   Problem List Items Addressed This Visit      Cardiovascular and Mediastinum   Hypertension   Relevant Medications   telmisartan (MICARDIS) 40 MG tablet     Digestive   Gastroesophageal reflux disease   Relevant Medications   pantoprazole (PROTONIX) 40 MG tablet     Endocrine   Hyperthyroidism - Primary     Other   Hyperlipidemia   Relevant Medications   telmisartan (MICARDIS) 40 MG tablet      Patient has lower blood pressure and still having some dizziness, will go ahead and lower his blood pressure medication by half, will send telmisartan 40 but he can take a half a tablet daily and try from the Follow up plan: Return in about 4 months (around 05/05/2019), or if symptoms worsen or fail to improve, for hypertension and GERD and thyroid.  Counseling provided for all of the vaccine components No  orders of the defined types were placed in this encounter.   Caryl Pina, MD [facility] 01/02/2019, 1:29 PM

## 2019-01-07 ENCOUNTER — Encounter: Payer: Self-pay | Admitting: *Deleted

## 2019-01-09 DIAGNOSIS — E0500 Thyrotoxicosis with diffuse goiter without thyrotoxic crisis or storm: Secondary | ICD-10-CM | POA: Diagnosis not present

## 2019-01-09 DIAGNOSIS — H02535 Eyelid retraction left lower eyelid: Secondary | ICD-10-CM | POA: Diagnosis not present

## 2019-01-09 DIAGNOSIS — H538 Other visual disturbances: Secondary | ICD-10-CM | POA: Diagnosis not present

## 2019-01-09 DIAGNOSIS — H5022 Vertical strabismus, left eye: Secondary | ICD-10-CM | POA: Diagnosis not present

## 2019-01-09 DIAGNOSIS — H05242 Constant exophthalmos, left eye: Secondary | ICD-10-CM | POA: Diagnosis not present

## 2019-01-09 DIAGNOSIS — H532 Diplopia: Secondary | ICD-10-CM | POA: Diagnosis not present

## 2019-01-09 DIAGNOSIS — H02536 Eyelid retraction left eye, unspecified eyelid: Secondary | ICD-10-CM | POA: Diagnosis not present

## 2019-01-09 DIAGNOSIS — R42 Dizziness and giddiness: Secondary | ICD-10-CM | POA: Diagnosis not present

## 2019-01-17 ENCOUNTER — Other Ambulatory Visit: Payer: Self-pay

## 2019-01-17 ENCOUNTER — Ambulatory Visit (INDEPENDENT_AMBULATORY_CARE_PROVIDER_SITE_OTHER): Payer: Medicare HMO | Admitting: *Deleted

## 2019-01-17 DIAGNOSIS — E538 Deficiency of other specified B group vitamins: Secondary | ICD-10-CM

## 2019-01-17 NOTE — Progress Notes (Signed)
Pt given Cyanocobalamin inj Tolerated well 

## 2019-02-14 ENCOUNTER — Other Ambulatory Visit: Payer: Self-pay

## 2019-02-15 ENCOUNTER — Ambulatory Visit (INDEPENDENT_AMBULATORY_CARE_PROVIDER_SITE_OTHER): Payer: Medicare HMO

## 2019-02-15 DIAGNOSIS — E538 Deficiency of other specified B group vitamins: Secondary | ICD-10-CM | POA: Diagnosis not present

## 2019-02-15 NOTE — Progress Notes (Signed)
Cyanocobalamin injection given to left deltoid.  Patient tolerated well. 

## 2019-02-18 ENCOUNTER — Other Ambulatory Visit: Payer: Self-pay | Admitting: Family Medicine

## 2019-03-20 ENCOUNTER — Other Ambulatory Visit: Payer: Self-pay

## 2019-03-21 ENCOUNTER — Ambulatory Visit (INDEPENDENT_AMBULATORY_CARE_PROVIDER_SITE_OTHER): Payer: Medicare HMO

## 2019-03-21 DIAGNOSIS — E538 Deficiency of other specified B group vitamins: Secondary | ICD-10-CM

## 2019-03-21 DIAGNOSIS — Z23 Encounter for immunization: Secondary | ICD-10-CM | POA: Diagnosis not present

## 2019-03-21 NOTE — Progress Notes (Signed)
Cyanocobalamin injection given to right deltoid.  Patient tolerated well. 

## 2019-03-27 ENCOUNTER — Telehealth: Payer: Self-pay | Admitting: Family Medicine

## 2019-03-27 MED ORDER — METHIMAZOLE 5 MG PO TABS: 5.0000 mg | ORAL_TABLET | ORAL | 3 refills | Status: DC

## 2019-03-27 NOTE — Telephone Encounter (Signed)
I sent in the prescription for methimazole for the patient

## 2019-03-27 NOTE — Telephone Encounter (Signed)
Left message on cell phone voice mail,  Script was sent in.

## 2019-03-27 NOTE — Telephone Encounter (Signed)
Last thyroid check 01-01-19 was in normal range. Please advise if script can be sent in.  Has not been done by our office before.

## 2019-03-30 ENCOUNTER — Other Ambulatory Visit: Payer: Self-pay | Admitting: Family Medicine

## 2019-04-19 ENCOUNTER — Other Ambulatory Visit: Payer: Self-pay

## 2019-04-22 ENCOUNTER — Ambulatory Visit (INDEPENDENT_AMBULATORY_CARE_PROVIDER_SITE_OTHER): Payer: Medicare HMO

## 2019-04-22 ENCOUNTER — Other Ambulatory Visit: Payer: Self-pay

## 2019-04-22 DIAGNOSIS — E538 Deficiency of other specified B group vitamins: Secondary | ICD-10-CM | POA: Diagnosis not present

## 2019-04-22 NOTE — Progress Notes (Signed)
Cyanocobalamin injection given to left deltoid.  Patient tolerated well. 

## 2019-04-30 ENCOUNTER — Other Ambulatory Visit: Payer: Self-pay

## 2019-05-01 ENCOUNTER — Ambulatory Visit (INDEPENDENT_AMBULATORY_CARE_PROVIDER_SITE_OTHER): Payer: Medicare HMO | Admitting: Family Medicine

## 2019-05-01 ENCOUNTER — Encounter: Payer: Self-pay | Admitting: Family Medicine

## 2019-05-01 VITALS — BP 130/74 | HR 67 | Temp 99.1°F | Ht 67.0 in | Wt 173.8 lb

## 2019-05-01 DIAGNOSIS — E7800 Pure hypercholesterolemia, unspecified: Secondary | ICD-10-CM | POA: Diagnosis not present

## 2019-05-01 DIAGNOSIS — I10 Essential (primary) hypertension: Secondary | ICD-10-CM

## 2019-05-01 DIAGNOSIS — E0590 Thyrotoxicosis, unspecified without thyrotoxic crisis or storm: Secondary | ICD-10-CM

## 2019-05-01 DIAGNOSIS — K219 Gastro-esophageal reflux disease without esophagitis: Secondary | ICD-10-CM

## 2019-05-01 MED ORDER — ROSUVASTATIN CALCIUM 10 MG PO TABS: 10.0000 mg | ORAL_TABLET | Freq: Every day | ORAL | 3 refills | Status: DC

## 2019-05-01 MED ORDER — METOPROLOL TARTRATE 25 MG PO TABS: 25.0000 mg | ORAL_TABLET | Freq: Two times a day (BID) | ORAL | 3 refills | Status: DC

## 2019-05-01 NOTE — Progress Notes (Signed)
BP 130/74   Pulse 67   Temp 99.1 F (37.3 C) (Temporal)   Ht _0  (1.702 m)   Wt 173 lb 12.8 oz (78.8 kg)   SpO2 95%   BMI 27.22 kg/m    Subjective:   Patient ID: Mario Smith, male    DOB: 1941-04-16, 78 y.o.   MRN: [id number]  HPI: Mario Smith is a 78 y.o. male presenting on 05/01/2019 for Hyperthyroidism (4 month follow up)   HPI Hyperthyroidism recheck Patient is coming in for thyroid recheck today as well. They deny any issues with hair changes or heat or cold problems or diarrhea or constipation. They deny any chest pain or palpitations. They are currently on methimazole.  Hypertension Patient is currently on metoprolol and hydrochlorothiazide and simvastatin, and their blood pressure today is 130/74. Patient denies any lightheadedness or dizziness. Patient denies headaches, blurred vision, chest pains, shortness of breath, or weakness. Denies any side effects from medication and is content with current medication.   Hyperlipidemia Patient is coming in for recheck of his hyperlipidemia. The patient is currently taking Crestor. They deny any issues with myalgias or history of liver damage from it. They deny any focal numbness or weakness or chest pain.   GERD Patient is currently on pantoprazole.  She denies any major symptoms or abdominal pain or belching or burping. She denies any blood in her stool or lightheadedness or dizziness.   Relevant past medical, surgical, family and social history reviewed and updated as indicated. Interim medical history since our last visit reviewed. Allergies and medications reviewed and updated.  Review of Systems  Constitutional: Negative for chills and fever.  Eyes: Negative for discharge.  Respiratory: Negative for shortness of breath and wheezing.   Cardiovascular: Negative for chest pain and leg swelling.  Musculoskeletal: Negative for back pain and gait problem.  Skin: Negative for rash.  Neurological: Negative for dizziness,  weakness and numbness.  All other systems reviewed and are negative.   Per HPI unless specifically indicated above   Allergies as of 05/01/2019      Reactions   Simvastatin Other (See Comments)   Leg cramps   Sulfa Antibiotics    UNSPECIFIED REACTION    Codeine Nausea And Vomiting   Hydrocodone Nausea And Vomiting   Meclizine Nausea Only   Niacin And Related Other (See Comments)   Flushing in the face and redness    Ondansetron Other (See Comments)   Tremors   Phenergan [promethazine Hcl] Other (See Comments)   Tremors      Medication List       Accurate as of May 01, 2019  1:10 PM. If you have any questions, ask your nurse or doctor.        aspirin 81 MG EC tablet Take 81 mg by mouth daily.   carboxymethylcellulose 0.5 % Soln Commonly known as: REFRESH PLUS Place 1 drop into both eyes as needed (dry eyes).   Co Q 10 100 MG Caps Take 100 mg by mouth daily.   Fish Oil 1000 MG Caps Take 1,000 mg by mouth daily.   fluticasone 50 MCG/ACT nasal spray Commonly known as: FLONASE SPRAY 2 SPRAYS INTO EACH NOSTRIL EVERY DAY   hydrochlorothiazide 12.5 MG capsule Commonly known as: MICROZIDE Take 2 capsules (25 mg total) by mouth daily. What changed:   how much to take  when to take this   methimazole 5 MG tablet Commonly known as: TAPAZOLE Take 1 tablet (5 mg total)  by mouth every Monday, Wednesday, and Friday.   metoprolol tartrate 25 MG tablet Commonly known as: LOPRESSOR Take 1 tablet (25 mg total) by mouth 2 (two) times daily.   pantoprazole 40 MG tablet Commonly known as: PROTONIX Take 1 tablet (40 mg total) by mouth 2 (two) times daily.   rosuvastatin 10 MG tablet Commonly known as: CRESTOR Take 1 tablet (10 mg total) by mouth daily.   selenium 200 MCG Tabs tablet Take 1 tablet (200 mcg total) by mouth daily.   telmisartan 40 MG tablet Commonly known as: MICARDIS Take 0.5 tablets (20 mg total) by mouth daily.   Vitamin D3 50 MCG (2000  UT) Tabs Take 2,000 Units by mouth daily.        Objective:   BP 130/74   Pulse 67   Temp 99.1 F (37.3 C) (Temporal)   Ht _0  (1.702 m)   Wt 173 lb 12.8 oz (78.8 kg)   SpO2 95%   BMI 27.22 kg/m   Wt Readings from Last 3 Encounters:  05/01/19 173 lb 12.8 oz (78.8 kg)  01/02/19 176 lb 6.4 oz (80 kg)  08/22/18 179 lb (81.2 kg)    Physical Exam Vitals signs and nursing note reviewed.  Constitutional:      General: He is not in acute distress.    Appearance: He is well-developed. He is not diaphoretic.  Eyes:     General: No scleral icterus.    Conjunctiva/sclera: Conjunctivae normal.  Neck:     Musculoskeletal: Neck supple.     Thyroid: No thyromegaly.  Cardiovascular:     Rate and Rhythm: Normal rate and regular rhythm.     Heart sounds: Normal heart sounds. No murmur.  Pulmonary:     Effort: Pulmonary effort is normal. No respiratory distress.     Breath sounds: Normal breath sounds. No wheezing.  Musculoskeletal: Normal range of motion.  Lymphadenopathy:     Cervical: No cervical adenopathy.  Skin:    General: Skin is warm and dry.     Findings: No rash.  Neurological:     Mental Status: He is alert and oriented to person, place, and time.     Coordination: Coordination normal.  Psychiatric:        Behavior: Behavior normal.       Assessment & Plan:   Problem List Items Addressed This Visit      Cardiovascular and Mediastinum   Hypertension   Relevant Medications   rosuvastatin (CRESTOR) 10 MG tablet   metoprolol tartrate (LOPRESSOR) 25 MG tablet   Other Relevant Orders   BMP8+EGFR     Digestive   Gastroesophageal reflux disease     Endocrine   Hyperthyroidism - Primary   Relevant Medications   metoprolol tartrate (LOPRESSOR) 25 MG tablet   Other Relevant Orders   TSH     Other   Hyperlipidemia   Relevant Medications   rosuvastatin (CRESTOR) 10 MG tablet   metoprolol tartrate (LOPRESSOR) 25 MG tablet      Continue metoprolol and  Crestor and will do blood work today.  He is only using hydrochlorothiazide as needed if his blood pressure goes up, recommended that he hold off on it unless it is up over 150. Continue methimazole as well Continue fish oil and B12 injections and Protonix Follow up plan: Return in about 4 months (around 08/29/2019), or if symptoms worsen or fail to improve, for Hypothyroidism and hyperlipidemia recheck.  Counseling provided for all of the vaccine components Orders  Placed This Encounter  Procedures  . BMP8+EGFR  . TSH    Caryl Pina, MD [facility] 05/01/2019, 1:10 PM

## 2019-05-02 LAB — BMP8+EGFR
BUN/Creatinine Ratio: 12 (ref 10–24)
BUN: 15 mg/dL (ref 8–27)
CO2: 23 mmol/L (ref 20–29)
Calcium: 9.3 mg/dL (ref 8.6–10.2)
Chloride: 105 mmol/L (ref 96–106)
Creatinine, Ser: 1.25 mg/dL (ref 0.76–1.27)
GFR calc Af Amer: 63 mL/min/{1.73_m2} (ref >59)
GFR calc non Af Amer: 54 mL/min/{1.73_m2} — ABNORMAL LOW (ref >59)
Glucose: 94 mg/dL (ref 65–99)
Potassium: 4.5 mmol/L (ref 3.5–5.2)
Sodium: 143 mmol/L (ref 134–144)

## 2019-05-02 LAB — TSH: TSH: 2.54 u[IU]/mL (ref 0.450–4.500)

## 2019-05-03 ENCOUNTER — Ambulatory Visit: Payer: Medicare HMO | Admitting: Family Medicine

## 2019-05-13 DIAGNOSIS — R69 Illness, unspecified: Secondary | ICD-10-CM | POA: Diagnosis not present

## 2019-05-17 ENCOUNTER — Telehealth: Payer: Self-pay | Admitting: Family Medicine

## 2019-05-17 DIAGNOSIS — Z1211 Encounter for screening for malignant neoplasm of colon: Secondary | ICD-10-CM

## 2019-05-17 NOTE — Telephone Encounter (Signed)
REFERRAL REQUEST Telephone Note  What type of referral do you need? Colonoscopy  Have you been seen at our office for this problem? no (Advise that they will likely need an appointment with their PCP before a referral can be done)  Is there a particular doctor or location that you prefer? [facility] in [facility]  Patient notified that referrals can take up to a week or longer to process. If they haven't heard anything within a week they should call back and speak with the referral department.

## 2019-05-20 ENCOUNTER — Encounter: Payer: Self-pay | Admitting: Internal Medicine

## 2019-05-20 NOTE — Telephone Encounter (Signed)
Placed referral for the patient for colonoscopy, although at his age it may not be recommended but if he really wants to he can go discuss it with them

## 2019-05-20 NOTE — Telephone Encounter (Signed)
Patient aware and verbalized understanding. °

## 2019-05-21 ENCOUNTER — Other Ambulatory Visit: Payer: Self-pay

## 2019-05-22 ENCOUNTER — Ambulatory Visit (INDEPENDENT_AMBULATORY_CARE_PROVIDER_SITE_OTHER): Payer: Medicare HMO

## 2019-05-22 DIAGNOSIS — E538 Deficiency of other specified B group vitamins: Secondary | ICD-10-CM

## 2019-05-22 NOTE — Progress Notes (Signed)
Cyanocobalamin injection given to right deltoid.  Patient tolerated well. 

## 2019-05-24 ENCOUNTER — Ambulatory Visit (INDEPENDENT_AMBULATORY_CARE_PROVIDER_SITE_OTHER): Payer: Medicare HMO | Admitting: *Deleted

## 2019-05-24 DIAGNOSIS — Z0000 Encounter for general adult medical examination without abnormal findings: Secondary | ICD-10-CM | POA: Diagnosis not present

## 2019-05-24 NOTE — Patient Instructions (Signed)

## 2019-05-24 NOTE — Progress Notes (Signed)
MEDICARE ANNUAL WELLNESS VISIT  05/24/2019  Telephone Visit Disclaimer This Medicare AWV was conducted by telephone due to national recommendations for restrictions regarding the COVID-19 Pandemic (e.g. social distancing).  I verified, using two identifiers, that I am speaking with Mario Smith or their authorized healthcare agent. I discussed the limitations, risks, security, and privacy concerns of performing an evaluation and management service by telephone and the potential availability of an in-person appointment in the future. The patient expressed understanding and agreed to proceed.   Subjective:  Mario Smith is a 78 y.o. male patient of Dettinger, Fransisca Kaufmann, MD who had a Medicare Annual Wellness Visit today via telephone. Mario Smith is Retired and lives with their spouse. he has 1 child. he reports that he is socially active and does interact with friends/family regularly. he is minimally physically active and enjoys deer hunting, fishing on his bass boat and yard work.  Patient Care Team: Dettinger, Fransisca Kaufmann, MD as PCP - General (Family Medicine) Inda Castle, MD (Inactive) as Attending Physician (Gastroenterology) Clista Bernhardt, MD as Referring Physician (Endocrinology) Arta Bruce, OD as Referring Physician (Ophthalmology)  Advanced Directives 05/24/2019 01/10/2018 10/10/2017 02/11/2016 05/16/2014 05/15/2014  Does Patient Have a Medical Advance Directive? Yes Yes Yes Yes Yes Yes  Type of Advance Directive Living will Living will Living will [place] will Living will  Does patient want to make changes to medical advance directive? No - Patient declined No - Patient declined No - Patient declined - No - Patient declined No - Patient declined  Copy of [place] in Chart? - - - - No - copy requested -    Hospital Utilization Over the Past 12 Months: # of hospitalizations or ER visits: 0 # of surgeries: 1  Review of Systems     Patient reports that his overall health is unchanged compared to last year.  History obtained from chart review  Patient Reported Readings (BP, Pulse, CBG, Weight, etc) none  Pain Assessment Pain : No/denies pain     Current Medications & Allergies (verified) Allergies as of 05/24/2019      Reactions   Simvastatin Other (See Comments)   Leg cramps   Sulfa Antibiotics    UNSPECIFIED REACTION    Codeine Nausea And Vomiting   Hydrocodone Nausea And Vomiting   Meclizine Nausea Only   Niacin And Related Other (See Comments)   Flushing in the face and redness    Ondansetron Other (See Comments)   Tremors   Phenergan [promethazine Hcl] Other (See Comments)   Tremors      Medication List       Accurate as of May 24, 2019  1:55 PM. If you have any questions, ask your nurse or doctor.        STOP taking these medications   selenium 200 MCG Tabs tablet     TAKE these medications   aspirin 81 MG EC tablet Take 81 mg by mouth daily.   carboxymethylcellulose 0.5 % Soln Commonly known as: REFRESH PLUS Place 1 drop into both eyes as needed (dry eyes).   Co Q 10 100 MG Caps Take 100 mg by mouth daily.   Fish Oil 1000 MG Caps Take 1,000 mg by mouth daily.   fluticasone 50 MCG/ACT nasal spray Commonly known as: FLONASE SPRAY 2 SPRAYS INTO EACH NOSTRIL EVERY DAY   hydrochlorothiazide 12.5 MG capsule Commonly known as: MICROZIDE Take 2 capsules (25 mg total)  by mouth daily. What changed:   how much to take  when to take this   methimazole 5 MG tablet Commonly known as: TAPAZOLE Take 1 tablet (5 mg total) by mouth every Monday, Wednesday, and Friday.   metoprolol tartrate 25 MG tablet Commonly known as: LOPRESSOR Take 1 tablet (25 mg total) by mouth 2 (two) times daily.   pantoprazole 40 MG tablet Commonly known as: PROTONIX Take 1 tablet (40 mg total) by mouth 2 (two) times daily.   rosuvastatin 10 MG tablet Commonly known as: CRESTOR Take 1  tablet (10 mg total) by mouth daily.   telmisartan 40 MG tablet Commonly known as: MICARDIS Take 0.5 tablets (20 mg total) by mouth daily.   Vitamin D3 50 MCG (2000 UT) Tabs Take 2,000 Units by mouth daily.       History (reviewed): Past Medical History:  Diagnosis Date  . Abdominal pain 10/10/2017  . Arthritis   . Back pain   . Colon polyp 2011   tubular adenoma.  no HG dysplasia.    . Constipation   . COPD (chronic obstructive pulmonary disease) ([place])   . Dehydration 10/10/2017  . Dyspnea    with exertion  . ED (erectile dysfunction)   . Essential hypertension, benign   . Gallbladder sludge 10/2017  . GERD (gastroesophageal reflux disease)    acute gastritis  . Graves disease   . History of kidney stones   . HTN (hypertension)   . Other and unspecified hyperlipidemia   . PONV (postoperative nausea and vomiting)    Pt's wife also said that when he had a nerve block in 2015 here, pt had a reaction immediately. States pt got very shaky, lost his voice.,Pt does not want a nerve block again  . Thrombocytopenia ([place])   . Vertigo    Past Surgical History:  Procedure Laterality Date  . CATARACT EXTRACTION Bilateral   . CHOLECYSTECTOMY N/A 01/18/2018   Procedure: LAPAROSCOPIC CHOLECYSTECTOMY WITH INTRAOPERATIVE CHOLANGIOGRAM ERAS PATHWAY;  Surgeon: Jovita Kussmaul, MD;  Location: [facility];  Service: General;  Laterality: N/A;  . COLONOSCOPY  03/2010    Dr Thomasenia Sales, [facility]. Higher risk screening study, sister's hx of colon CA.    Marland Kitchen ESOPHAGOGASTRODUODENOSCOPY (EGD) WITH PROPOFOL N/A 10/11/2017   Procedure: ESOPHAGOGASTRODUODENOSCOPY (EGD) WITH PROPOFOL;  Surgeon: Mauri Pole, MD;  Location: [facility];  Service: Endoscopy;  Laterality: N/A;  . EYE SURGERY     lid-lift  . SPINAL FUSION  2001   cervical  . TENDON REPAIR Right 05/16/2014   Procedure: RIGHT RING FINGER FLEXOR TENDON REPAIR WITH PULL OUT BUTTON;  Surgeon: Roseanne Kaufman, MD;  Location: [facility];  Service: Orthopedics;  Laterality: Right;   Family History  Problem Relation Age of Onset  . Heart disease Mother   . COPD Mother   . Heart disease Father   . COPD Father   . Alcohol abuse Father   . Cancer Sister        colon cancer  . Emphysema Sister   . Cancer Brother        prostate  . Emphysema Sister   . Prostate cancer Brother   . Healthy Daughter    Social History   Socioeconomic History  . Marital status: Married    Spouse name: Mary  . Number of children: 1  . Years of education: 8  . Highest education level: 8th grade  Occupational History  . Occupation: retired  Tobacco Use  .  Smoking status: Former Smoker    Types: Cigarettes    Quit date: 12/19/1992    Years since quitting: 26.4  . Smokeless tobacco: Former Systems developer    Types: [place] date: 12/19/1992  Substance and Sexual Activity  . Alcohol use: Not Currently  . Drug use: No  . Sexual activity: Yes  Other Topics Concern  . Not on file  Social History Narrative  . Not on file   Social Determinants of Health   Financial Resource Strain: Low Risk   . Difficulty of Paying Living Expenses: Not hard at all  Food Insecurity: No Food Insecurity  . Worried About Charity fundraiser in the Last Year: Never true  . Ran Out of Food in the Last Year: Never true  Transportation Needs: No Transportation Needs  . Lack of Transportation (Medical): No  . Lack of Transportation (Non-Medical): No  Physical Activity: Insufficiently Active  . Days of Exercise per Week: 3 days  . Minutes of Exercise per Session: 10 min  Stress: No Stress Concern Present  . Feeling of Stress : Not at all  Social Connections: Not Isolated  . Frequency of Communication with Friends and Family: More than three times a week  . Frequency of Social Gatherings with Friends and Family: More than three times a week  . Attends Religious Services: More than 4 times per year  . Active Member of Clubs or Organizations: Yes    . Attends Archivist Meetings: More than 4 times per year  . Marital Status: Married    Activities of Daily Living In your present state of health, do you have any difficulty performing the following activities: 05/24/2019  Hearing? Y  Comment wears bilateral hearing aids  Vision? Y  Comment double vision every once in a while-wears glasses all the time-gets yearly eye exam  Difficulty concentrating or making decisions? Y  Comment has trouble remembering names  Walking or climbing stairs? N  Dressing or bathing? N  Doing errands, shopping? N  Preparing Food and eating ? N  Using the Toilet? N  In the past six months, have you accidently leaked urine? N  Do you have problems with loss of bowel control? N  Managing your Medications? N  Managing your Finances? N  Housekeeping or managing your Housekeeping? N  Some recent data might be hidden    Patient Education/ Literacy How often do you need to have someone help you when you read instructions, pamphlets, or other written materials from your doctor or pharmacy?: 1 - Never What is the last grade level you completed in school?: 8th grade  Exercise Current Exercise Habits: Home exercise routine, Type of exercise: walking, Time (Minutes): 10, Frequency (Times/Week): 3, Weekly Exercise (Minutes/Week): 30, Intensity: Mild, Exercise limited by: None identified  Diet Patient reports consuming 3 meals a day and 2 snack(s) a day Patient reports that his primary diet is: Regular Patient reports that she does have regular access to food.   Depression Screen PHQ 2/9 Scores 05/24/2019 05/01/2019 01/02/2019 08/22/2018 06/01/2018 04/10/2018 12/04/2017  PHQ - 2 Score 0 0 0 0 0 0 0     Fall Risk Fall Risk  05/24/2019 05/01/2019 01/02/2019 08/22/2018 06/01/2018  Falls in the past year? 0 0 0 0 0  Number falls in past yr: - - - - -  Injury with Fall? - - - - -  Comment - - - - -  Risk for fall due to : - - - - -  Objective:  Mario  Smith Smith seemed alert and oriented and he participated appropriately during our telephone visit.  Blood Pressure Weight BMI  BP Readings from Last 3 Encounters:  05/01/19 130/74  01/02/19 114/69  08/22/18 (!) 90/58   Wt Readings from Last 3 Encounters:  05/01/19 173 lb 12.8 oz (78.8 kg)  01/02/19 176 lb 6.4 oz (80 kg)  08/22/18 179 lb (81.2 kg)   BMI Readings from Last 1 Encounters:  05/01/19 27.22 kg/m    *Unable to obtain current vital signs, weight, and BMI due to telephone visit type  Hearing/Vision  . Mario Smith did not seem to have difficulty with hearing/understanding during the telephone conversation . Reports that he has had a formal eye exam by an eye care professional within the past year . Reports that he has not had a formal hearing evaluation within the past year *Unable to fully assess hearing and vision during telephone visit type  Cognitive Function: 6CIT Screen 05/24/2019  What Year? 0 points  What month? 0 points  What time? 0 points  Count back from 20 2 points  Months in reverse 2 points  Repeat phrase 0 points  Total Score 4   (Normal:0-7, Significant for Dysfunction: >8)  Normal Cognitive Function Screening: Yes   Immunization & Health Maintenance Record Immunization History  Administered Date(s) Administered  . Fluad Quad(high Dose 65+) 03/21/2019  . Influenza Whole 11/04/2009  . Influenza, High Dose Seasonal PF 04/02/2015, 04/19/2016, 03/09/2017, 03/08/2018  . Influenza,inj,Quad PF,6+ Mos 03/27/2013, 04/16/2014  . Pneumococcal Conjugate-13 05/27/2013  . Pneumococcal Polysaccharide-23 01/12/2007  . Td 12/08/2002, 03/16/2011  . Tdap 03/16/2011  . Zoster 09/05/2006    Health Maintenance  Topic Date Due  . TETANUS/TDAP  03/15/2021  . INFLUENZA VACCINE  Completed  . PNA vac Low Risk Adult  Completed       Assessment  This is a routine wellness examination for Mario Smith.  Health Maintenance: Due or Overdue There are no preventive care  reminders to display for this patient.  Mario Smith does not need a referral for Community Assistance: Care Management:   no Social Work:    no Prescription Assistance:  no Nutrition/Diabetes Education:  no   Plan:  Personalized Goals Goals Addressed            This Visit's Progress   . DIET - INCREASE WATER INTAKE       Try to drink 6-8 glasses of water daily.      Personalized Health Maintenance & Screening Recommendations  Advanced directives: has an advanced directive - a copy HAS NOT been provided.  Lung Cancer Screening Recommended: no (Low Dose CT Chest recommended if Age 63-80 years, 30 pack-year currently smoking OR have quit w/in past 15 years) Hepatitis C Screening recommended: no HIV Screening recommended: no  Advanced Directives: Written information was not prepared per patient's request.  Referrals & Orders No orders of the defined types were placed in this encounter.   Follow-up Plan . Follow-up with Dettinger, Fransisca Kaufmann, MD as planned . Bring a copy of your Living Will in for our records   I have personally reviewed and noted the following in the patient's chart:   . Medical and social history . Use of alcohol, tobacco or illicit drugs  . Current medications and supplements . Functional ability and status . Nutritional status . Physical activity . Advanced directives . List of other physicians . Hospitalizations, surgeries, and ER visits in previous 12 months . Vitals .  Screenings to include cognitive, depression, and falls . Referrals and appointments  In addition, I have reviewed and discussed with Mario Smith certain preventive protocols, quality metrics, and best practice recommendations. A written personalized care plan for preventive services as well as general preventive health recommendations is available and can be mailed to the patient at his request.      Milas Hock, LPN  [phone]

## 2019-06-19 ENCOUNTER — Encounter: Payer: Self-pay | Admitting: Internal Medicine

## 2019-06-19 ENCOUNTER — Other Ambulatory Visit: Payer: Self-pay

## 2019-06-20 ENCOUNTER — Ambulatory Visit (INDEPENDENT_AMBULATORY_CARE_PROVIDER_SITE_OTHER): Payer: Medicare HMO | Admitting: *Deleted

## 2019-06-20 DIAGNOSIS — E538 Deficiency of other specified B group vitamins: Secondary | ICD-10-CM | POA: Diagnosis not present

## 2019-07-01 DIAGNOSIS — R69 Illness, unspecified: Secondary | ICD-10-CM | POA: Diagnosis not present

## 2019-07-03 DIAGNOSIS — R69 Illness, unspecified: Secondary | ICD-10-CM | POA: Diagnosis not present

## 2019-07-04 ENCOUNTER — Encounter: Payer: Medicare HMO | Admitting: Internal Medicine

## 2019-07-19 ENCOUNTER — Telehealth: Payer: Self-pay | Admitting: Family Medicine

## 2019-07-19 ENCOUNTER — Ambulatory Visit (AMBULATORY_SURGERY_CENTER): Payer: Self-pay | Admitting: *Deleted

## 2019-07-19 ENCOUNTER — Other Ambulatory Visit: Payer: Self-pay

## 2019-07-19 VITALS — Temp 97.7°F | Ht 67.0 in | Wt 179.0 lb

## 2019-07-19 DIAGNOSIS — Z01818 Encounter for other preprocedural examination: Secondary | ICD-10-CM

## 2019-07-19 DIAGNOSIS — Z86010 Personal history of colonic polyps: Secondary | ICD-10-CM

## 2019-07-19 MED ORDER — SUPREP BOWEL PREP KIT 17.5-3.13-1.6 GM/177ML PO SOLN: ORAL | 0 refills | Status: DC

## 2019-07-19 NOTE — Progress Notes (Signed)
Pt is aware that care partner will wait in the car during procedure; if they feel like they will be too hot or cold to wait in the car; they may wait in the 4 th floor lobby. Patient is aware to bring only one care partner. We want them to wear a mask (we do not have any that we can provide them), practice social distancing, and we will check their temperatures when they get here.  I did remind the patient that their care partner needs to stay in the parking lot the entire time and have a cell phone available, we will call them when the pt is ready for discharge. Patient will wear mask into building.   Pt has PONV  No trouble with intubation or malignant hypothermia per pt  No egg or soy allergy  No home oxygen use   No medications for weight loss taken  emmi information given  Covid test at [facility] 07-30-19 at 12 noon

## 2019-07-22 ENCOUNTER — Ambulatory Visit: Payer: Medicare HMO

## 2019-07-23 ENCOUNTER — Other Ambulatory Visit: Payer: Self-pay

## 2019-07-23 ENCOUNTER — Ambulatory Visit (INDEPENDENT_AMBULATORY_CARE_PROVIDER_SITE_OTHER): Payer: Medicare HMO | Admitting: *Deleted

## 2019-07-23 ENCOUNTER — Encounter: Payer: Self-pay | Admitting: Internal Medicine

## 2019-07-23 DIAGNOSIS — E538 Deficiency of other specified B group vitamins: Secondary | ICD-10-CM | POA: Diagnosis not present

## 2019-07-23 NOTE — Progress Notes (Signed)
Vitamin b12 injection given and patient tolerated well.  

## 2019-07-30 ENCOUNTER — Other Ambulatory Visit: Payer: Self-pay

## 2019-07-30 ENCOUNTER — Other Ambulatory Visit (HOSPITAL_COMMUNITY)
Admission: RE | Admit: 2019-07-30 | Discharge: 2019-07-30 | Disposition: A | Discharge status: Home / Self Care | Payer: Medicare HMO | Source: Ambulatory Visit | Attending: Internal Medicine | Admitting: Internal Medicine

## 2019-07-30 DIAGNOSIS — Z01812 Encounter for preprocedural laboratory examination: Secondary | ICD-10-CM | POA: Diagnosis not present

## 2019-07-30 DIAGNOSIS — Z20822 Contact with and (suspected) exposure to covid-19: Secondary | ICD-10-CM | POA: Diagnosis not present

## 2019-07-30 LAB — SARS CORONAVIRUS 2 (TAT 6-24 HRS): SARS Coronavirus 2: NEGATIVE

## 2019-08-02 ENCOUNTER — Ambulatory Visit (AMBULATORY_SURGERY_CENTER): Payer: Medicare HMO | Admitting: Internal Medicine

## 2019-08-02 ENCOUNTER — Other Ambulatory Visit: Payer: Self-pay

## 2019-08-02 ENCOUNTER — Encounter: Payer: Self-pay | Admitting: Internal Medicine

## 2019-08-02 VITALS — BP 112/56 | HR 64 | Temp 96.2°F | Resp 13 | Ht 67.0 in | Wt 179.0 lb

## 2019-08-02 DIAGNOSIS — Z1211 Encounter for screening for malignant neoplasm of colon: Secondary | ICD-10-CM | POA: Diagnosis not present

## 2019-08-02 DIAGNOSIS — Z86010 Personal history of colonic polyps: Secondary | ICD-10-CM

## 2019-08-02 MED ORDER — SODIUM CHLORIDE 0.9 % IV SOLN: 500.0000 mL | INTRAVENOUS | Status: DC

## 2019-08-02 NOTE — Op Note (Signed)
[facility] Patient Name: Mario Smith Procedure Date: 08/02/2019 1:26 PM MRN: [id number] Endoscopist: Jerene Bears , MD Age: 79 Referring MD:  Date of Birth: Dec 09, 1940 Gender: Male Account #: [id number] Procedure:                Colonoscopy Indications:              Screening in patient at increased risk: Family                            history of 1st-degree relative with colorectal                            cancer, Last colonoscopy 10 years ago Medicines:                Monitored Anesthesia Care Procedure:                Pre-Anesthesia Assessment:                           - Prior to the procedure, a History and Physical                            was performed, and patient medications and                            allergies were reviewed. The patient's tolerance of                            previous anesthesia was also reviewed. The risks                            and benefits of the procedure and the sedation                            options and risks were discussed with the patient.                            All questions were answered, and informed consent                            was obtained. Prior Anticoagulants: The patient has                            taken no previous anticoagulant or antiplatelet                            agents. ASA Grade Assessment: II - A patient with                            mild systemic disease. After reviewing the risks                            and benefits, the patient was deemed in  satisfactory condition to undergo the procedure.                           After obtaining informed consent, the colonoscope                            was passed under direct vision. Throughout the                            procedure, the patient's blood pressure, pulse, and                            oxygen saturations were monitored continuously. The                            Colonoscope was introduced through  the anus and                            advanced to the cecum, identified by appendiceal                            orifice and ileocecal valve. The colonoscopy was                            performed without difficulty. The patient tolerated                            the procedure well. The quality of the bowel                            preparation was good. Scope In: 1:53:34 PM Scope Out: 2:02:45 PM Scope Withdrawal Time: 0 hours 6 minutes 41 seconds  Total Procedure Duration: 0 hours 9 minutes 11 seconds  Findings:                 The digital rectal exam was normal.                           The entire examined colon appeared normal on direct                            and retroflexion views. Complications:            No immediate complications. Estimated Blood Loss:     Estimated blood loss: none. Impression:               - The entire examined colon is normal on direct and                            retroflexion views.                           - No specimens collected. Recommendation:           - Patient has a contact number available for  emergencies. The signs and symptoms of potential                            delayed complications were discussed with the                            patient. Return to normal activities tomorrow.                            Written discharge instructions were provided to the                            patient.                           - Resume previous diet.                           - Continue present medications.                           - No repeat colonoscopy due to age at next                            screening interval and the absence of colonic                            polyps. Jerene Bears, MD 08/02/2019 2:05:19 PM This report has been signed electronically.

## 2019-08-02 NOTE — Progress Notes (Signed)
Temp JB V/s KA Pt's states no medical or surgical changes since previsit or office visit.

## 2019-08-02 NOTE — Patient Instructions (Signed)
YOU HAD AN ENDOSCOPIC PROCEDURE TODAY AT THE [facility]:   Refer to the procedure report that was given to you for any specific questions about what was found during the examination.  If the procedure report does not answer your questions, please call your gastroenterologist to clarify.  If you requested that your care partner not be given the details of your procedure findings, then the procedure report has been included in a sealed envelope for you to review at your convenience later.  YOU SHOULD EXPECT: Some feelings of bloating in the abdomen. Passage of more gas than usual.  Walking can help get rid of the air that was put into your GI tract during the procedure and reduce the bloating. If you had a lower endoscopy (such as a colonoscopy or flexible sigmoidoscopy) you may notice spotting of blood in your stool or on the toilet paper. If you underwent a bowel prep for your procedure, you may not have a normal bowel movement for a few days.  Please Note:  You might notice some irritation and congestion in your nose or some drainage.  This is from the oxygen used during your procedure.  There is no need for concern and it should clear up in a day or so.  SYMPTOMS TO REPORT IMMEDIATELY:   Following lower endoscopy (colonoscopy or flexible sigmoidoscopy):  Excessive amounts of blood in the stool  Significant tenderness or worsening of abdominal pains  Swelling of the abdomen that is new, acute  Fever of 100F or higher  For urgent or emergent issues, a gastroenterologist can be reached at any hour by calling [phone].   DIET:  We do recommend a small meal at first, but then you may proceed to your regular diet.  Drink plenty of fluids but you should avoid alcoholic beverages for 24 hours.  ACTIVITY:  You should plan to take it easy for the rest of today and you should NOT DRIVE or use heavy machinery until tomorrow (because of the sedation medicines used during the test).     FOLLOW UP: Our staff will call the number listed on your records 48-72 hours following your procedure to check on you and address any questions or concerns that you may have regarding the information given to you following your procedure. If we do not reach you, we will leave a message.  We will attempt to reach you two times.  During this call, we will ask if you have developed any symptoms of COVID 19. If you develop any symptoms (ie: fever, flu-like symptoms, shortness of breath, cough etc.) before then, please call [phone].  If you test positive for Covid 19 in the 2 weeks post procedure, please call and report this information to us.    If any biopsies were taken you will be contacted by phone or by letter within the next 1-3 weeks.  Please call us at [phone] if you have not heard about the biopsies in 3 weeks.    SIGNATURES/CONFIDENTIALITY: You and/or your care partner have signed paperwork which will be entered into your electronic medical record.  These signatures attest to the fact that that the information above on your After Visit Summary has been reviewed and is understood.  Full responsibility of the confidentiality of this discharge information lies with you and/or your care-partner. 

## 2019-08-02 NOTE — Progress Notes (Signed)
PT taken to PACU. Monitors in place. VSS. Report given to RN. 

## 2019-08-06 ENCOUNTER — Telehealth: Payer: Self-pay

## 2019-08-06 NOTE — Telephone Encounter (Signed)
  Follow up Call-  Call back number 08/02/2019  Post procedure Call Back phone  # [phone]  Permission to leave phone message Yes  Some recent data might be hidden     Patient questions:  Do you have a fever, pain , or abdominal swelling? No. Pain Score  0 *  Have you tolerated food without any problems? Yes.    Have you been able to return to your normal activities? Yes.    Do you have any questions about your discharge instructions: Diet   No. Medications  No. Follow up visit  No.  Do you have questions or concerns about your Care? No.  Actions: * If pain score is 4 or above: No action needed, pain <4.  1. Have you developed a fever since your procedure? no  2.   Have you had an respiratory symptoms (SOB or cough) since your procedure? no  3.   Have you tested positive for COVID 19 since your procedure no  4.   Have you had any family members/close contacts diagnosed with the COVID 19 since your procedure?  no   If yes to any of these questions please route to Joylene John, RN and Alphonsa Gin, Therapist, sports.

## 2019-08-22 ENCOUNTER — Other Ambulatory Visit: Payer: Self-pay

## 2019-08-22 ENCOUNTER — Ambulatory Visit (INDEPENDENT_AMBULATORY_CARE_PROVIDER_SITE_OTHER): Payer: Medicare HMO

## 2019-08-22 DIAGNOSIS — E538 Deficiency of other specified B group vitamins: Secondary | ICD-10-CM | POA: Diagnosis not present

## 2019-08-22 NOTE — Progress Notes (Signed)
Cyanocobalamin injection given to left deltoid.  Patient tolerated well. 

## 2019-08-26 ENCOUNTER — Other Ambulatory Visit: Payer: Self-pay

## 2019-08-26 ENCOUNTER — Other Ambulatory Visit: Payer: Medicare HMO

## 2019-08-26 ENCOUNTER — Other Ambulatory Visit: Payer: Self-pay | Admitting: Family Medicine

## 2019-08-26 DIAGNOSIS — E0590 Thyrotoxicosis, unspecified without thyrotoxic crisis or storm: Secondary | ICD-10-CM | POA: Diagnosis not present

## 2019-08-26 DIAGNOSIS — E7800 Pure hypercholesterolemia, unspecified: Secondary | ICD-10-CM | POA: Diagnosis not present

## 2019-08-26 DIAGNOSIS — R972 Elevated prostate specific antigen [PSA]: Secondary | ICD-10-CM | POA: Diagnosis not present

## 2019-08-26 DIAGNOSIS — I10 Essential (primary) hypertension: Secondary | ICD-10-CM

## 2019-08-26 NOTE — Progress Notes (Signed)
Placed lab orders for the patient 

## 2019-08-27 LAB — TSH: TSH: 3.48 u[IU]/mL (ref 0.450–4.500)

## 2019-08-27 LAB — LIPID PANEL
Chol/HDL Ratio: 3 ratio (ref 0.0–5.0)
Cholesterol, Total: 142 mg/dL (ref 100–199)
HDL: 47 mg/dL (ref >39)
LDL Chol Calc (NIH): 77 mg/dL (ref 0–99)
Triglycerides: 99 mg/dL (ref 0–149)
VLDL Cholesterol Cal: 18 mg/dL (ref 5–40)

## 2019-08-27 LAB — CMP14+EGFR
ALT: 22 IU/L (ref 0–44)
AST: 27 IU/L (ref 0–40)
Albumin/Globulin Ratio: 2 (ref 1.2–2.2)
Albumin: 4.7 g/dL (ref 3.7–4.7)
Alkaline Phosphatase: 79 IU/L (ref 39–117)
BUN/Creatinine Ratio: 10 (ref 10–24)
BUN: 12 mg/dL (ref 8–27)
Bilirubin Total: 0.6 mg/dL (ref 0.0–1.2)
CO2: 25 mmol/L (ref 20–29)
Calcium: 9.3 mg/dL (ref 8.6–10.2)
Chloride: 104 mmol/L (ref 96–106)
Creatinine, Ser: 1.18 mg/dL (ref 0.76–1.27)
GFR calc Af Amer: 67 mL/min/{1.73_m2} (ref >59)
GFR calc non Af Amer: 58 mL/min/{1.73_m2} — ABNORMAL LOW (ref >59)
Globulin, Total: 2.4 g/dL (ref 1.5–4.5)
Glucose: 100 mg/dL — ABNORMAL HIGH (ref 65–99)
Potassium: 3.8 mmol/L (ref 3.5–5.2)
Sodium: 144 mmol/L (ref 134–144)
Total Protein: 7.1 g/dL (ref 6.0–8.5)

## 2019-08-27 LAB — CBC WITH DIFFERENTIAL/PLATELET
Basophils Absolute: 0.1 10*3/uL (ref 0.0–0.2)
Basos: 1 %
EOS (ABSOLUTE): 0.1 10*3/uL (ref 0.0–0.4)
Eos: 1 %
Hematocrit: 45.7 % (ref 37.5–51.0)
Hemoglobin: 15.7 g/dL (ref 13.0–17.7)
Immature Grans (Abs): 0 10*3/uL (ref 0.0–0.1)
Immature Granulocytes: 0 %
Lymphocytes Absolute: 1.7 10*3/uL (ref 0.7–3.1)
Lymphs: 28 %
MCH: 32 pg (ref 26.6–33.0)
MCHC: 34.4 g/dL (ref 31.5–35.7)
MCV: 93 fL (ref 79–97)
Monocytes Absolute: 0.5 10*3/uL (ref 0.1–0.9)
Monocytes: 9 %
Neutrophils Absolute: 3.7 10*3/uL (ref 1.4–7.0)
Neutrophils: 61 %
Platelets: 145 10*3/uL — ABNORMAL LOW (ref 150–450)
RBC: 4.91 x10E6/uL (ref 4.14–5.80)
RDW: 13 % (ref 11.6–15.4)
WBC: 6 10*3/uL (ref 3.4–10.8)

## 2019-08-27 LAB — PSA, TOTAL AND FREE
PSA, Free Pct: 24.5 %
PSA, Free: 0.98 ng/mL
Prostate Specific Ag, Serum: 4 ng/mL (ref 0.0–4.0)

## 2019-08-29 ENCOUNTER — Encounter: Payer: Self-pay | Admitting: Family Medicine

## 2019-08-29 ENCOUNTER — Other Ambulatory Visit: Payer: Self-pay

## 2019-08-29 ENCOUNTER — Ambulatory Visit (INDEPENDENT_AMBULATORY_CARE_PROVIDER_SITE_OTHER): Payer: Medicare HMO | Admitting: Family Medicine

## 2019-08-29 VITALS — BP 139/71 | HR 72 | Temp 98.7°F | Ht 67.0 in | Wt 172.0 lb

## 2019-08-29 DIAGNOSIS — E7800 Pure hypercholesterolemia, unspecified: Secondary | ICD-10-CM

## 2019-08-29 DIAGNOSIS — E0590 Thyrotoxicosis, unspecified without thyrotoxic crisis or storm: Secondary | ICD-10-CM

## 2019-08-29 DIAGNOSIS — I10 Essential (primary) hypertension: Secondary | ICD-10-CM | POA: Diagnosis not present

## 2019-08-29 NOTE — Progress Notes (Signed)
BP 139/71   Pulse 72   Temp 98.7 F (37.1 C)   Ht '5\' 7"'  (1.702 m)   Wt 172 lb (78 kg)   SpO2 96%   BMI 26.94 kg/m    Subjective:   Patient ID: Mario Smith, male    DOB: [birth year], 79 y.o.   MRN: [id number]  HPI: Mario Smith is a 79 y.o. male presenting on 08/29/2019 for Medical Management of Chronic Issues (91m and Hypothyroidism   HPI Hyperthyroidism recheck Patient is coming in for thyroid recheck today as well. They deny any issues with hair changes or heat or cold problems or diarrhea or constipation. They deny any chest pain or palpitations. They are currently on methimazole  Hyperlipidemia Patient is coming in for recheck of his hyperlipidemia. The patient is currently taking fish oils and Crestor. They deny any issues with myalgias or history of liver damage from it. They deny any focal numbness or weakness or chest pain.   Hypertension Patient is currently on hydrochlorothiazide and metoprolol and telmisartan, and their blood pressure today is 139/71. Patient denies any lightheadedness or dizziness. Patient denies headaches, blurred vision, chest pains, shortness of breath, or weakness. Denies any side effects from medication and is content with current medication.   Relevant past medical, surgical, family and social history reviewed and updated as indicated. Interim medical history since our last visit reviewed. Allergies and medications reviewed and updated.  Review of Systems  Constitutional: Negative for chills and fever.  Eyes: Negative for visual disturbance.  Respiratory: Negative for shortness of breath and wheezing.   Cardiovascular: Negative for chest pain and leg swelling.  Musculoskeletal: Negative for back pain and gait problem.  Skin: Negative for rash.  Neurological: Negative for dizziness, weakness and light-headedness.  All other systems reviewed and are negative.   Per HPI unless specifically indicated above   Allergies as of 08/29/2019     Reactions   Simvastatin Other (See Comments)   Leg cramps   Sulfa Antibiotics    UNSPECIFIED REACTION    Codeine Nausea And Vomiting   Hydrocodone Nausea And Vomiting   Meclizine Nausea Only   Niacin And Related Other (See Comments)   Flushing in the face and redness    Ondansetron Other (See Comments)   Tremors   Phenergan [promethazine Hcl] Other (See Comments)   Tremors      Medication List       Accurate as of August 29, 2019  2:02 PM. If you have any questions, ask your nurse or doctor.        STOP taking these medications   aspirin 81 MG EC tablet Stopped by: JWorthy Rancher MD   Suprep Bowel Prep Kit 17.5-3.13-1.6 GM/177ML Soln Generic drug: Na Sulfate-K Sulfate-Mg Sulf Stopped by: JFransisca KaufmannDettinger, MD     TAKE these medications   carboxymethylcellulose 0.5 % Soln Commonly known as: REFRESH PLUS Place 1 drop into both eyes as needed (dry eyes).   Co Q 10 100 MG Caps Take 100 mg by mouth daily.   Fish Oil 1000 MG Caps Take 1,000 mg by mouth daily.   fluticasone 50 MCG/ACT nasal spray Commonly known as: FLONASE SPRAY 2 SPRAYS INTO EACH NOSTRIL EVERY DAY   hydrochlorothiazide 12.5 MG capsule Commonly known as: MICROZIDE Take 2 capsules (25 mg total) by mouth daily.   methimazole 5 MG tablet Commonly known as: TAPAZOLE Take 1 tablet (5 mg total) by mouth every Monday, Wednesday, and Friday.  metoprolol tartrate 25 MG tablet Commonly known as: LOPRESSOR Take 1 tablet (25 mg total) by mouth 2 (two) times daily.   pantoprazole 40 MG tablet Commonly known as: PROTONIX Take 1 tablet (40 mg total) by mouth 2 (two) times daily.   rosuvastatin 10 MG tablet Commonly known as: CRESTOR Take 1 tablet (10 mg total) by mouth daily.   telmisartan 40 MG tablet Commonly known as: MICARDIS Take 0.5 tablets (20 mg total) by mouth daily.   Vitamin D3 50 MCG (2000 UT) Tabs Take 2,000 Units by mouth daily.        Objective:   BP 139/71   Pulse 72    Temp 98.7 F (37.1 C)   Ht '5\' 7"'  (1.702 m)   Wt 172 lb (78 kg)   SpO2 96%   BMI 26.94 kg/m   Wt Readings from Last 3 Encounters:  08/29/19 172 lb (78 kg)  08/02/19 179 lb (81.2 kg)  07/19/19 179 lb (81.2 kg)    Physical Exam Vitals and nursing note reviewed.  Constitutional:      General: He is not in acute distress.    Appearance: He is well-developed. He is not diaphoretic.  Eyes:     General: No scleral icterus.    Conjunctiva/sclera: Conjunctivae normal.  Neck:     Thyroid: No thyromegaly.  Cardiovascular:     Rate and Rhythm: Normal rate and regular rhythm.     Heart sounds: Normal heart sounds. No murmur.  Pulmonary:     Effort: Pulmonary effort is normal. No respiratory distress.     Breath sounds: Normal breath sounds. No wheezing.  Musculoskeletal:        General: Normal range of motion.     Cervical back: Neck supple.  Lymphadenopathy:     Cervical: No cervical adenopathy.  Skin:    General: Skin is warm and dry.     Findings: No rash.  Neurological:     Mental Status: He is alert and oriented to person, place, and time.     Coordination: Coordination normal.  Psychiatric:        Behavior: Behavior normal.     Results for orders placed or performed in visit on 08/26/19  PSA, total and free  Result Value Ref Range   Prostate Specific Ag, Serum 4.0 0.0 - 4.0 ng/mL   PSA, Free 0.98 N/A ng/mL   PSA, Free Pct 24.5 %  TSH  Result Value Ref Range   TSH 3.480 0.450 - 4.500 uIU/mL  Lipid panel  Result Value Ref Range   Cholesterol, Total 142 100 - 199 mg/dL   Triglycerides 99 0 - 149 mg/dL   HDL 47 >39 mg/dL   VLDL Cholesterol Cal 18 5 - 40 mg/dL   LDL Chol Calc (NIH) 77 0 - 99 mg/dL   Chol/HDL Ratio 3.0 0.0 - 5.0 ratio  CMP14+EGFR  Result Value Ref Range   Glucose 100 (H) 65 - 99 mg/dL   BUN 12 8 - 27 mg/dL   Creatinine, Ser 1.18 0.76 - 1.27 mg/dL   GFR calc non Af Amer 58 (L) >59 mL/min/1.73   GFR calc Af Amer 67 >59 mL/min/1.73    BUN/Creatinine Ratio 10 10 - 24   Sodium 144 134 - 144 mmol/L   Potassium 3.8 3.5 - 5.2 mmol/L   Chloride 104 96 - 106 mmol/L   CO2 25 20 - 29 mmol/L   Calcium 9.3 8.6 - 10.2 mg/dL   Total Protein 7.1 6.0 -  8.5 g/dL   Albumin 4.7 3.7 - 4.7 g/dL   Globulin, Total 2.4 1.5 - 4.5 g/dL   Albumin/Globulin Ratio 2.0 1.2 - 2.2   Bilirubin Total 0.6 0.0 - 1.2 mg/dL   Alkaline Phosphatase 79 39 - 117 IU/L   AST 27 0 - 40 IU/L   ALT 22 0 - 44 IU/L  CBC with Differential/Platelet  Result Value Ref Range   WBC 6.0 3.4 - 10.8 x10E3/uL   RBC 4.91 4.14 - 5.80 x10E6/uL   Hemoglobin 15.7 13.0 - 17.7 g/dL   Hematocrit 45.7 37.5 - 51.0 %   MCV 93 79 - 97 fL   MCH 32.0 26.6 - 33.0 pg   MCHC 34.4 31.5 - 35.7 g/dL   RDW 13.0 11.6 - 15.4 %   Platelets 145 (L) 150 - 450 x10E3/uL   Neutrophils 61 Not Estab. %   Lymphs 28 Not Estab. %   Monocytes 9 Not Estab. %   Eos 1 Not Estab. %   Basos 1 Not Estab. %   Neutrophils Absolute 3.7 1.4 - 7.0 x10E3/uL   Lymphocytes Absolute 1.7 0.7 - 3.1 x10E3/uL   Monocytes Absolute 0.5 0.1 - 0.9 x10E3/uL   EOS (ABSOLUTE) 0.1 0.0 - 0.4 x10E3/uL   Basophils Absolute 0.1 0.0 - 0.2 x10E3/uL   Immature Granulocytes 0 Not Estab. %   Immature Grans (Abs) 0.0 0.0 - 0.1 x10E3/uL    Assessment & Plan:   Problem List Items Addressed This Visit      Cardiovascular and Mediastinum   Hypertension     Endocrine   Hyperthyroidism - Primary     Other   Hyperlipidemia      Labs look great, continue current medications, see back in 4 months. Follow up plan: Return in about 3 months (around 11/29/2019), or if symptoms worsen or fail to improve, for Hypertension and hypothyroidism.  Counseling provided for all of the vaccine components No orders of the defined types were placed in this encounter.   Caryl Pina, MD [facility] 08/29/2019, 2:02 PM

## 2019-09-23 ENCOUNTER — Ambulatory Visit (INDEPENDENT_AMBULATORY_CARE_PROVIDER_SITE_OTHER): Payer: Medicare HMO

## 2019-09-23 ENCOUNTER — Other Ambulatory Visit: Payer: Self-pay

## 2019-09-23 DIAGNOSIS — E538 Deficiency of other specified B group vitamins: Secondary | ICD-10-CM

## 2019-09-23 NOTE — Progress Notes (Signed)
Cyanocobalamin injection given to right deltoid.  Patient tolerated well. 

## 2019-10-21 DIAGNOSIS — I10 Essential (primary) hypertension: Secondary | ICD-10-CM | POA: Diagnosis not present

## 2019-10-21 DIAGNOSIS — E7800 Pure hypercholesterolemia, unspecified: Secondary | ICD-10-CM | POA: Diagnosis not present

## 2019-10-21 DIAGNOSIS — E0590 Thyrotoxicosis, unspecified without thyrotoxic crisis or storm: Secondary | ICD-10-CM | POA: Diagnosis not present

## 2019-10-21 DIAGNOSIS — Z961 Presence of intraocular lens: Secondary | ICD-10-CM | POA: Diagnosis not present

## 2019-10-21 DIAGNOSIS — Z0100 Encounter for examination of eyes and vision without abnormal findings: Secondary | ICD-10-CM | POA: Diagnosis not present

## 2019-10-21 DIAGNOSIS — H524 Presbyopia: Secondary | ICD-10-CM | POA: Diagnosis not present

## 2019-10-24 ENCOUNTER — Ambulatory Visit (INDEPENDENT_AMBULATORY_CARE_PROVIDER_SITE_OTHER): Payer: Medicare HMO | Admitting: Family Medicine

## 2019-10-24 ENCOUNTER — Other Ambulatory Visit: Payer: Self-pay

## 2019-10-24 DIAGNOSIS — E538 Deficiency of other specified B group vitamins: Secondary | ICD-10-CM

## 2019-10-24 MED ORDER — CYANOCOBALAMIN 1000 MCG/ML IJ SOLN
1000.0000 ug | Freq: Once | INTRAMUSCULAR | Status: AC
  Administered 2019-10-24: 1000 ug via INTRAMUSCULAR

## 2019-11-25 ENCOUNTER — Ambulatory Visit (INDEPENDENT_AMBULATORY_CARE_PROVIDER_SITE_OTHER): Payer: Medicare HMO

## 2019-11-25 ENCOUNTER — Other Ambulatory Visit: Payer: Self-pay

## 2019-11-25 DIAGNOSIS — E538 Deficiency of other specified B group vitamins: Secondary | ICD-10-CM

## 2019-11-25 MED ORDER — CYANOCOBALAMIN 1000 MCG/ML IJ SOLN
1000.0000 ug | INTRAMUSCULAR | Status: AC
  Administered 2019-11-25 – 2022-09-20 (×30): 1000 ug via INTRAMUSCULAR

## 2019-11-25 NOTE — Progress Notes (Signed)
B12 injection given in right deltoid without difficulty. Pt tolerated well. Pt has an OV scheduled with Dettinger 12/26/19. Will give B12 in that OV. Pt aware of this and agrees.

## 2019-12-23 ENCOUNTER — Other Ambulatory Visit: Payer: Self-pay | Admitting: Family Medicine

## 2019-12-23 ENCOUNTER — Other Ambulatory Visit: Payer: Self-pay

## 2019-12-23 ENCOUNTER — Other Ambulatory Visit: Payer: Medicare HMO

## 2019-12-23 DIAGNOSIS — L570 Actinic keratosis: Secondary | ICD-10-CM | POA: Diagnosis not present

## 2019-12-23 DIAGNOSIS — L218 Other seborrheic dermatitis: Secondary | ICD-10-CM | POA: Diagnosis not present

## 2019-12-23 DIAGNOSIS — E0590 Thyrotoxicosis, unspecified without thyrotoxic crisis or storm: Secondary | ICD-10-CM

## 2019-12-23 DIAGNOSIS — L819 Disorder of pigmentation, unspecified: Secondary | ICD-10-CM | POA: Diagnosis not present

## 2019-12-23 DIAGNOSIS — D485 Neoplasm of uncertain behavior of skin: Secondary | ICD-10-CM | POA: Diagnosis not present

## 2019-12-23 NOTE — Progress Notes (Signed)
Place lab orders for patient

## 2019-12-24 ENCOUNTER — Other Ambulatory Visit: Payer: Self-pay | Admitting: Family Medicine

## 2019-12-24 DIAGNOSIS — I10 Essential (primary) hypertension: Secondary | ICD-10-CM

## 2019-12-24 LAB — CMP14+EGFR
ALT: 23 IU/L (ref 0–44)
AST: 20 IU/L (ref 0–40)
Albumin/Globulin Ratio: 2.1 (ref 1.2–2.2)
Albumin: 4.5 g/dL (ref 3.7–4.7)
Alkaline Phosphatase: 79 IU/L (ref 48–121)
BUN/Creatinine Ratio: 11 (ref 10–24)
BUN: 13 mg/dL (ref 8–27)
Bilirubin Total: 0.6 mg/dL (ref 0.0–1.2)
CO2: 24 mmol/L (ref 20–29)
Calcium: 9 mg/dL (ref 8.6–10.2)
Chloride: 107 mmol/L — ABNORMAL HIGH (ref 96–106)
Creatinine, Ser: 1.16 mg/dL (ref 0.76–1.27)
GFR calc Af Amer: 69 mL/min/{1.73_m2} (ref >59)
GFR calc non Af Amer: 60 mL/min/{1.73_m2} (ref >59)
Globulin, Total: 2.1 g/dL (ref 1.5–4.5)
Glucose: 95 mg/dL (ref 65–99)
Potassium: 4 mmol/L (ref 3.5–5.2)
Sodium: 144 mmol/L (ref 134–144)
Total Protein: 6.6 g/dL (ref 6.0–8.5)

## 2019-12-24 LAB — TSH: TSH: 4.11 u[IU]/mL (ref 0.450–4.500)

## 2019-12-26 ENCOUNTER — Other Ambulatory Visit: Payer: Self-pay

## 2019-12-26 ENCOUNTER — Ambulatory Visit (INDEPENDENT_AMBULATORY_CARE_PROVIDER_SITE_OTHER): Payer: Medicare HMO | Admitting: Family Medicine

## 2019-12-26 ENCOUNTER — Encounter: Payer: Self-pay | Admitting: Family Medicine

## 2019-12-26 VITALS — BP 129/80 | HR 64 | Temp 97.6°F | Ht 67.0 in | Wt 174.0 lb

## 2019-12-26 DIAGNOSIS — K219 Gastro-esophageal reflux disease without esophagitis: Secondary | ICD-10-CM | POA: Diagnosis not present

## 2019-12-26 DIAGNOSIS — E538 Deficiency of other specified B group vitamins: Secondary | ICD-10-CM

## 2019-12-26 DIAGNOSIS — I10 Essential (primary) hypertension: Secondary | ICD-10-CM

## 2019-12-26 DIAGNOSIS — E0590 Thyrotoxicosis, unspecified without thyrotoxic crisis or storm: Secondary | ICD-10-CM

## 2019-12-26 DIAGNOSIS — E7800 Pure hypercholesterolemia, unspecified: Secondary | ICD-10-CM | POA: Diagnosis not present

## 2019-12-26 MED ORDER — TAMSULOSIN HCL 0.4 MG PO CAPS
0.4000 mg | ORAL_CAPSULE | Freq: Every day | ORAL | 3 refills | Status: DC
Start: 2019-12-26 — End: 2020-03-23

## 2019-12-26 NOTE — Progress Notes (Signed)
BP (!) 129/80   Pulse 64   Temp 97.6 F (36.4 C)   Ht _0  (1.702 m)   Wt 174 lb (78.9 kg)   SpO2 97%   BMI 27.25 kg/m    Subjective:   Patient ID: Mario Smith, male    DOB: Aug 04, 1940, 79 y.o.   MRN: [id number]  HPI: Mario Smith is a 79 y.o. male presenting on 12/26/2019 for Medical Management of Chronic Issues, Hypothyroidism, and B12 Deficiency   HPI Hypertension Patient is currently on hydrochlorothiazide and metoprolol, starting, and their blood pressure today is 129/80. Patient denies any lightheadedness or dizziness. Patient denies headaches, blurred vision, chest pains, shortness of breath, or weakness. Denies any side effects from medication and is content with current medication.   Hyperlipidemia Patient is coming in for recheck of his hyperlipidemia. The patient is currently taking Crestor. They deny any issues with myalgias or history of liver damage from it. They deny any focal numbness or weakness or chest pain.   Hyperthyroidism recheck Patient is coming in for thyroid recheck today as well. They deny any issues with hair changes or heat or cold problems or diarrhea or constipation. They deny any chest pain or palpitations. They are currently on methimazole 5 mg  GERD Patient is currently on pantoprazole.  She denies any major symptoms or abdominal pain or belching or burping. She denies any blood in her stool or lightheadedness or dizziness.   Relevant past medical, surgical, family and social history reviewed and updated as indicated. Interim medical history since our last visit reviewed. Allergies and medications reviewed and updated.  Review of Systems  Constitutional: Negative for chills and fever.  Eyes: Negative for visual disturbance.  Respiratory: Negative for shortness of breath and wheezing.   Cardiovascular: Negative for chest pain and leg swelling.  Musculoskeletal: Negative for back pain and gait problem.  Skin: Negative for rash.    Neurological: Negative for dizziness, weakness and light-headedness.  All other systems reviewed and are negative.   Per HPI unless specifically indicated above   Allergies as of 12/26/2019      Reactions   Simvastatin Other (See Comments)   Leg cramps   Sulfa Antibiotics    UNSPECIFIED REACTION    Codeine Nausea And Vomiting   Hydrocodone Nausea And Vomiting   Meclizine Nausea Only   Niacin And Related Other (See Comments)   Flushing in the face and redness    Ondansetron Other (See Comments)   Tremors   Phenergan [promethazine Hcl] Other (See Comments)   Tremors      Medication List       Accurate as of December 26, 2019  1:08 PM. If you have any questions, ask your nurse or doctor.        carboxymethylcellulose 0.5 % Soln Commonly known as: REFRESH PLUS Place 1 drop into both eyes as needed (dry eyes).   Co Q 10 100 MG Caps Take 100 mg by mouth daily.   Fish Oil 1000 MG Caps Take 1,000 mg by mouth daily.   fluticasone 50 MCG/ACT nasal spray Commonly known as: FLONASE SPRAY 2 SPRAYS INTO EACH NOSTRIL EVERY DAY   hydrochlorothiazide 12.5 MG capsule Commonly known as: MICROZIDE Take 2 capsules (25 mg total) by mouth daily.   methimazole 5 MG tablet Commonly known as: TAPAZOLE Take 1 tablet (5 mg total) by mouth every Monday, Wednesday, and Friday.   metoprolol tartrate 25 MG tablet Commonly known as: LOPRESSOR Take 1 tablet (  25 mg total) by mouth 2 (two) times daily.   pantoprazole 40 MG tablet Commonly known as: PROTONIX Take 1 tablet (40 mg total) by mouth 2 (two) times daily.   rosuvastatin 10 MG tablet Commonly known as: CRESTOR Take 1 tablet (10 mg total) by mouth daily.   telmisartan 40 MG tablet Commonly known as: MICARDIS TAKE 1/2 TABLET BY MOUTH DAILY   Vitamin D3 50 MCG (2000 UT) Tabs Take 2,000 Units by mouth daily.        Objective:   BP (!) 129/80   Pulse 64   Temp 97.6 F (36.4 C)   Ht _0  (1.702 m)   Wt 174 lb (78.9 kg)    SpO2 97%   BMI 27.25 kg/m   Wt Readings from Last 3 Encounters:  12/26/19 174 lb (78.9 kg)  08/29/19 172 lb (78 kg)  08/02/19 179 lb (81.2 kg)    Physical Exam Vitals and nursing note reviewed.  Constitutional:      General: He is not in acute distress.    Appearance: He is well-developed. He is not diaphoretic.  Eyes:     General: No scleral icterus.    Conjunctiva/sclera: Conjunctivae normal.  Neck:     Thyroid: No thyromegaly.  Cardiovascular:     Rate and Rhythm: Normal rate and regular rhythm.     Heart sounds: Normal heart sounds. No murmur heard.   Pulmonary:     Effort: Pulmonary effort is normal. No respiratory distress.     Breath sounds: Normal breath sounds. No wheezing.  Musculoskeletal:        General: Normal range of motion.     Cervical back: Neck supple.  Lymphadenopathy:     Cervical: No cervical adenopathy.  Skin:    General: Skin is warm and dry.     Findings: No rash.  Neurological:     Mental Status: He is alert and oriented to person, place, and time.     Coordination: Coordination normal.  Psychiatric:        Behavior: Behavior normal.     Results for orders placed or performed in visit on 12/23/19  TSH  Result Value Ref Range   TSH 4.110 0.450 - 4.500 uIU/mL  CMP14+EGFR  Result Value Ref Range   Glucose 95 65 - 99 mg/dL   BUN 13 8 - 27 mg/dL   Creatinine, Ser 1.16 0.76 - 1.27 mg/dL   GFR calc non Af Amer 60 >59 mL/min/1.73   GFR calc Af Amer 69 >59 mL/min/1.73   BUN/Creatinine Ratio 11 10 - 24   Sodium 144 134 - 144 mmol/L   Potassium 4.0 3.5 - 5.2 mmol/L   Chloride 107 (H) 96 - 106 mmol/L   CO2 24 20 - 29 mmol/L   Calcium 9.0 8.6 - 10.2 mg/dL   Total Protein 6.6 6.0 - 8.5 g/dL   Albumin 4.5 3.7 - 4.7 g/dL   Globulin, Total 2.1 1.5 - 4.5 g/dL   Albumin/Globulin Ratio 2.1 1.2 - 2.2   Bilirubin Total 0.6 0.0 - 1.2 mg/dL   Alkaline Phosphatase 79 48 - 121 IU/L   AST 20 0 - 40 IU/L   ALT 23 0 - 44 IU/L    Assessment & Plan:    Problem List Items Addressed This Visit      Cardiovascular and Mediastinum   Hypertension     Digestive   Gastroesophageal reflux disease     Endocrine   Hyperthyroidism - Primary  Other   Hyperlipidemia      Patient's blood work looks good and blood pressure looks good, no changes. Follow up plan: Return in about 4 months (around 04/27/2020), or if symptoms worsen or fail to improve, for Hypertension and cholesterol and thyroid.  Counseling provided for all of the vaccine components No orders of the defined types were placed in this encounter.   Caryl Pina, MD [facility] 12/26/2019, 1:08 PM

## 2019-12-30 DIAGNOSIS — R69 Illness, unspecified: Secondary | ICD-10-CM | POA: Diagnosis not present

## 2020-01-27 ENCOUNTER — Other Ambulatory Visit: Payer: Self-pay | Admitting: Family Medicine

## 2020-01-27 ENCOUNTER — Ambulatory Visit (INDEPENDENT_AMBULATORY_CARE_PROVIDER_SITE_OTHER): Payer: Medicare HMO | Admitting: *Deleted

## 2020-01-27 ENCOUNTER — Other Ambulatory Visit: Payer: Self-pay

## 2020-01-27 DIAGNOSIS — E538 Deficiency of other specified B group vitamins: Secondary | ICD-10-CM

## 2020-01-27 NOTE — Progress Notes (Signed)
PATIENT IN TODAY FOR MONTHLY B12 INJECTION. 1000 MCG GIVEN IM IN RIGHT DELTOID. PATIENT TOLERATED WELL.  

## 2020-02-20 ENCOUNTER — Encounter: Payer: Self-pay | Admitting: Family Medicine

## 2020-02-20 ENCOUNTER — Ambulatory Visit (INDEPENDENT_AMBULATORY_CARE_PROVIDER_SITE_OTHER): Payer: Medicare HMO | Admitting: Family Medicine

## 2020-02-20 ENCOUNTER — Other Ambulatory Visit: Payer: Self-pay

## 2020-02-20 VITALS — BP 138/72 | HR 64 | Temp 98.0°F | Ht 67.0 in | Wt 178.0 lb

## 2020-02-20 DIAGNOSIS — I8311 Varicose veins of right lower extremity with inflammation: Secondary | ICD-10-CM | POA: Diagnosis not present

## 2020-02-20 NOTE — Progress Notes (Signed)
BP 138/72   Pulse 64   Temp 98 F (36.7 C)   Ht 5\' 7"  (1.702 m)   Wt 80.7 kg   SpO2 95%   BMI 27.88 kg/m    Subjective:   Patient ID: Mario Smith, male    DOB: [birth year], 79 y.o.   MRN: [id number]  HPI: Mario Smith is a 79 y.o. male presenting on 02/20/2020 for Leg Pain (RLE, pain and cramping. Present x 2 weeks)   HPI  Patient presents to clinic today for evaluation or right lower extremity pain. Patient states the pain has been occurring for about 2-3 weeks. He denies any inciting trauma that could have caused the pain. He states he has never had anything like this before and the pain is about a 7-8/10. He says the pain is in the lower-middle aspect of the lower leg overlying the fibula. He states the pain radiates to the ankle and foot and has a burning sensation. He also informed that last Saturday, while he was in [place] standing and walking, the pain radiated to his hip. He denies any pain on his left leg. He states he thinks he would be able to decrease the pain with ibuprofen but does not take it due to history of gastritis in the past. He says Tylenol does not help. He endorse a history of shingles but states this pain does not feel like that. He denies weakness, difficulty walking, or vesicular rash.  His leg swelling is chronic and not any more swollen than it normally is.  Relevant past medical, surgical, family and social history reviewed and updated as indicated. Interim medical history since our last visit reviewed. Allergies and medications reviewed and updated.  Review of Systems  Constitutional: Negative for chills, fatigue and fever.  Respiratory: Negative for cough and shortness of breath.   Cardiovascular: Positive for leg swelling. Negative for chest pain and palpitations.  Gastrointestinal: Negative for abdominal pain, diarrhea, nausea and vomiting.  Musculoskeletal: Positive for joint swelling. Negative for arthralgias and gait problem.  Neurological:  Negative for weakness.    Per HPI unless specifically indicated above   Allergies as of 02/20/2020      Reactions   Simvastatin Other (See Comments)   Leg cramps   Sulfa Antibiotics    UNSPECIFIED REACTION    Codeine Nausea And Vomiting   Hydrocodone Nausea And Vomiting   Meclizine Nausea Only   Niacin And Related Other (See Comments)   Flushing in the face and redness    Ondansetron Other (See Comments)   Tremors   Phenergan [promethazine Hcl] Other (See Comments)   Tremors      Medication List       Accurate as of February 20, 2020  9:33 AM. If you have any questions, ask your nurse or doctor.        carboxymethylcellulose 0.5 % Soln Commonly known as: REFRESH PLUS Place 1 drop into both eyes as needed (dry eyes).   Co Q 10 100 MG Caps Take 100 mg by mouth daily.   Fish Oil 1000 MG Caps Take 1,000 mg by mouth daily.   fluticasone 50 MCG/ACT nasal spray Commonly known as: FLONASE SPRAY 2 SPRAYS INTO EACH NOSTRIL EVERY DAY   hydrochlorothiazide 12.5 MG capsule Commonly known as: MICROZIDE Take 2 capsules (25 mg total) by mouth daily.   methimazole 5 MG tablet Commonly known as: TAPAZOLE Take 1 tablet (5 mg total) by mouth every Monday, Wednesday, and Friday.  metoprolol tartrate 25 MG tablet Commonly known as: LOPRESSOR Take 1 tablet (25 mg total) by mouth 2 (two) times daily.   pantoprazole 40 MG tablet Commonly known as: PROTONIX TAKE 1 TABLET BY MOUTH TWICE A DAY   rosuvastatin 10 MG tablet Commonly known as: CRESTOR Take 1 tablet (10 mg total) by mouth daily.   tamsulosin 0.4 MG Caps capsule Commonly known as: FLOMAX Take 1 capsule (0.4 mg total) by mouth daily.   telmisartan 40 MG tablet Commonly known as: MICARDIS TAKE 1/2 TABLET BY MOUTH DAILY   Vitamin D3 50 MCG (2000 UT) Tabs Take 2,000 Units by mouth daily.        Objective:   BP 138/72   Pulse 64   Temp 98 F (36.7 C)   Ht 5\' 7"  (1.702 m)   Wt 80.7 kg   SpO2 95%   BMI  27.88 kg/m   Wt Readings from Last 3 Encounters:  02/20/20 80.7 kg  12/26/19 78.9 kg  08/29/19 78 kg    Physical Exam Constitutional:      General: He is not in acute distress.    Appearance: Normal appearance. He is normal weight.  Cardiovascular:     Rate and Rhythm: Normal rate and regular rhythm.     Pulses: Normal pulses.     Heart sounds: Normal heart sounds.  Pulmonary:     Effort: No respiratory distress.     Breath sounds: No wheezing.  Musculoskeletal:        General: Tenderness present. No swelling or signs of injury.     Right lower leg: Edema present.  Skin:    General: Skin is warm and dry.     Capillary Refill: Capillary refill takes 2 to 3 seconds.     Findings: No rash.          Comments: Patient has mildly torturous varicose veins on the lower lateral aspect of the right leg.  Neurological:     Mental Status: He is alert.     Sensory: No sensory deficit.     Motor: No weakness.     Assessment & Plan:   Problem List Items Addressed This Visit    None    Visit Diagnoses    Varicose veins of right lower extremity with inflammation    -  Primary      Plan: Patient's condition was discussed with him and the following plan was determined:    1.  We recommend getting medical grade compression stockings to help decrease symptoms of the varicose veins    2. We recommend getting OTC Voltaren Gel to help decrease cramping and pain symptoms.    Plan was discussed with Dr. Caryl Pina, MD who is in agreement with stated plan.      Maxie Better PA-S    Follow up plan: Return if symptoms worsen or fail to improve.  Counseling provided for all of the vaccine components No orders of the defined types were placed in this encounter.  Patient seen and examined with Maxie Better, PA student, agree with assessment and plan above.  Based on findings and exam where it is very pinpoint tenderness overlying the visible varicosities, it is not  likely to have anything to do with a possible DVT and is most likely related to varicose veins and will treat as such.  Instructed patient that if any swelling worsens then to give [place] call back. Caryl Pina, MD [facility] 02/20/2020, 9:33 AM

## 2020-02-27 ENCOUNTER — Ambulatory Visit (INDEPENDENT_AMBULATORY_CARE_PROVIDER_SITE_OTHER): Payer: Medicare HMO | Admitting: *Deleted

## 2020-02-27 ENCOUNTER — Other Ambulatory Visit: Payer: Self-pay

## 2020-02-27 DIAGNOSIS — E538 Deficiency of other specified B group vitamins: Secondary | ICD-10-CM

## 2020-02-27 NOTE — Progress Notes (Signed)
Patient in today for monthly B 12 injection. 1000 mcg given IM in left deltoid. Patient tolerated well.  

## 2020-03-22 ENCOUNTER — Other Ambulatory Visit: Payer: Self-pay | Admitting: Family Medicine

## 2020-03-24 DIAGNOSIS — D1801 Hemangioma of skin and subcutaneous tissue: Secondary | ICD-10-CM | POA: Diagnosis not present

## 2020-03-24 DIAGNOSIS — D229 Melanocytic nevi, unspecified: Secondary | ICD-10-CM | POA: Diagnosis not present

## 2020-03-24 DIAGNOSIS — L570 Actinic keratosis: Secondary | ICD-10-CM | POA: Diagnosis not present

## 2020-03-24 DIAGNOSIS — L111 Transient acantholytic dermatosis [Grover]: Secondary | ICD-10-CM | POA: Diagnosis not present

## 2020-03-24 DIAGNOSIS — L814 Other melanin hyperpigmentation: Secondary | ICD-10-CM | POA: Diagnosis not present

## 2020-03-24 DIAGNOSIS — L821 Other seborrheic keratosis: Secondary | ICD-10-CM | POA: Diagnosis not present

## 2020-03-24 DIAGNOSIS — L819 Disorder of pigmentation, unspecified: Secondary | ICD-10-CM | POA: Diagnosis not present

## 2020-03-30 ENCOUNTER — Other Ambulatory Visit: Payer: Self-pay

## 2020-03-30 ENCOUNTER — Ambulatory Visit (INDEPENDENT_AMBULATORY_CARE_PROVIDER_SITE_OTHER): Payer: Medicare HMO | Admitting: *Deleted

## 2020-03-30 DIAGNOSIS — Z23 Encounter for immunization: Secondary | ICD-10-CM | POA: Diagnosis not present

## 2020-03-30 DIAGNOSIS — E538 Deficiency of other specified B group vitamins: Secondary | ICD-10-CM | POA: Diagnosis not present

## 2020-03-30 NOTE — Progress Notes (Signed)
B12 INJECTION given and tolerated well  Flu vaccine given and tolerated well

## 2020-04-24 ENCOUNTER — Other Ambulatory Visit: Payer: Self-pay

## 2020-04-24 ENCOUNTER — Other Ambulatory Visit: Payer: Medicare HMO

## 2020-04-24 DIAGNOSIS — I10 Essential (primary) hypertension: Secondary | ICD-10-CM | POA: Diagnosis not present

## 2020-04-24 DIAGNOSIS — E0590 Thyrotoxicosis, unspecified without thyrotoxic crisis or storm: Secondary | ICD-10-CM

## 2020-04-24 DIAGNOSIS — R972 Elevated prostate specific antigen [PSA]: Secondary | ICD-10-CM

## 2020-04-25 LAB — CMP14+EGFR
ALT: 17 IU/L (ref 0–44)
AST: 20 IU/L (ref 0–40)
Albumin/Globulin Ratio: 1.8 (ref 1.2–2.2)
Albumin: 4.6 g/dL (ref 3.7–4.7)
Alkaline Phosphatase: 78 IU/L (ref 44–121)
BUN/Creatinine Ratio: 10 (ref 10–24)
BUN: 14 mg/dL (ref 8–27)
Bilirubin Total: 0.8 mg/dL (ref 0.0–1.2)
CO2: 24 mmol/L (ref 20–29)
Calcium: 9.5 mg/dL (ref 8.6–10.2)
Chloride: 105 mmol/L (ref 96–106)
Creatinine, Ser: 1.34 mg/dL — ABNORMAL HIGH (ref 0.76–1.27)
GFR calc Af Amer: 57 mL/min/{1.73_m2} — ABNORMAL LOW (ref >59)
GFR calc non Af Amer: 50 mL/min/{1.73_m2} — ABNORMAL LOW (ref >59)
Globulin, Total: 2.5 g/dL (ref 1.5–4.5)
Glucose: 96 mg/dL (ref 65–99)
Potassium: 4.2 mmol/L (ref 3.5–5.2)
Sodium: 143 mmol/L (ref 134–144)
Total Protein: 7.1 g/dL (ref 6.0–8.5)

## 2020-04-25 LAB — TSH: TSH: 3.58 u[IU]/mL (ref 0.450–4.500)

## 2020-04-25 LAB — PSA, TOTAL AND FREE
PSA, Free Pct: 16.6 %
PSA, Free: 0.53 ng/mL
Prostate Specific Ag, Serum: 3.2 ng/mL (ref 0.0–4.0)

## 2020-04-27 ENCOUNTER — Ambulatory Visit (INDEPENDENT_AMBULATORY_CARE_PROVIDER_SITE_OTHER): Payer: Medicare HMO | Admitting: Family Medicine

## 2020-04-27 ENCOUNTER — Other Ambulatory Visit: Payer: Self-pay

## 2020-04-27 ENCOUNTER — Encounter: Payer: Self-pay | Admitting: Family Medicine

## 2020-04-27 VITALS — BP 149/76 | HR 68 | Temp 97.0°F | Ht 67.0 in | Wt 180.0 lb

## 2020-04-27 DIAGNOSIS — E0590 Thyrotoxicosis, unspecified without thyrotoxic crisis or storm: Secondary | ICD-10-CM

## 2020-04-27 DIAGNOSIS — E7800 Pure hypercholesterolemia, unspecified: Secondary | ICD-10-CM

## 2020-04-27 DIAGNOSIS — E538 Deficiency of other specified B group vitamins: Secondary | ICD-10-CM | POA: Diagnosis not present

## 2020-04-27 DIAGNOSIS — E0500 Thyrotoxicosis with diffuse goiter without thyrotoxic crisis or storm: Secondary | ICD-10-CM | POA: Diagnosis not present

## 2020-04-27 DIAGNOSIS — I10 Essential (primary) hypertension: Secondary | ICD-10-CM

## 2020-04-27 MED ORDER — TAMSULOSIN HCL 0.4 MG PO CAPS
0.4000 mg | ORAL_CAPSULE | Freq: Every day | ORAL | 3 refills | Status: DC
Start: 2020-04-27 — End: 2021-04-26

## 2020-04-27 MED ORDER — PANTOPRAZOLE SODIUM 40 MG PO TBEC
40.0000 mg | DELAYED_RELEASE_TABLET | Freq: Two times a day (BID) | ORAL | 3 refills | Status: DC
Start: 2020-04-27 — End: 2021-04-26

## 2020-04-27 MED ORDER — METOPROLOL TARTRATE 25 MG PO TABS: 25.0000 mg | ORAL_TABLET | Freq: Two times a day (BID) | ORAL | 3 refills | Status: DC

## 2020-04-27 MED ORDER — HYDROCHLOROTHIAZIDE 12.5 MG PO CAPS
25.0000 mg | ORAL_CAPSULE | Freq: Every day | ORAL | 3 refills | Status: DC
Start: 2020-04-27 — End: 2021-04-26

## 2020-04-27 MED ORDER — METHIMAZOLE 5 MG PO TABS: 5.0000 mg | ORAL_TABLET | ORAL | 3 refills | Status: DC

## 2020-04-27 MED ORDER — TELMISARTAN 40 MG PO TABS: 20.0000 mg | ORAL_TABLET | Freq: Every day | ORAL | 3 refills | Status: DC

## 2020-04-27 MED ORDER — ROSUVASTATIN CALCIUM 10 MG PO TABS: 10.0000 mg | ORAL_TABLET | Freq: Every day | ORAL | 3 refills | Status: DC

## 2020-04-27 NOTE — Progress Notes (Signed)
BP (!) 149/76   Pulse 68   Temp (!) 97 F (36.1 C)   Ht '5\' 7"'  (1.702 m)   Wt 180 lb (81.6 kg)   SpO2 99%   BMI 28.19 kg/m    Subjective:   Patient ID: Mario Smith, male    DOB: 1941-03-27, 79 y.o.   MRN: [id number]  HPI: Mario Smith is a 79 y.o. male presenting on 04/27/2020 for Medical Management of Chronic Issues   HPI Hyperthyroidism and Graves' recheck Patient is coming in for thyroid recheck today as well. They deny any issues with hair changes or heat or cold problems or diarrhea or constipation. They deny any chest pain or palpitations. They are currently on methimazole 5 mg 3 times a week  Hyperlipidemia Patient is coming in for recheck of his hyperlipidemia. The patient is currently taking Crestor. They deny any issues with myalgias or history of liver damage from it. They deny any focal numbness or weakness or chest pain.   Hypertension Patient is currently on hydrochlorothiazide and telmisartan and metoprolol, and their blood pressure today is 149/76. Patient denies any lightheadedness or dizziness. Patient denies headaches, blurred vision, chest pains, shortness of breath, or weakness. Denies any side effects from medication and is content with current medication.  He said he had one episode of palpitations on 1 day couple weeks ago when he was fasting and then went had some coffee and had not taken his medicines yet.  He has not had any other similar episodes.  Relevant past medical, surgical, family and social history reviewed and updated as indicated. Interim medical history since our last visit reviewed. Allergies and medications reviewed and updated.  Review of Systems  Constitutional: Negative for chills and fever.  Eyes: Negative for discharge.  Respiratory: Negative for shortness of breath and wheezing.   Cardiovascular: Negative for chest pain and leg swelling.  Musculoskeletal: Negative for back pain and gait problem.  Skin: Negative for rash.    Neurological: Negative for dizziness, weakness and light-headedness.  All other systems reviewed and are negative.   Per HPI unless specifically indicated above   Allergies as of 04/27/2020      Reactions   Simvastatin Other (See Comments)   Leg cramps   Sulfa Antibiotics    UNSPECIFIED REACTION    Codeine Nausea And Vomiting   Hydrocodone Nausea And Vomiting   Meclizine Nausea Only   Niacin And Related Other (See Comments)   Flushing in the face and redness    Ondansetron Other (See Comments)   Tremors   Phenergan [promethazine Hcl] Other (See Comments)   Tremors      Medication List       Accurate as of April 27, 2020 11:01 AM. If you have any questions, ask your nurse or doctor.        carboxymethylcellulose 0.5 % Soln Commonly known as: REFRESH PLUS Place 1 drop into both eyes as needed (dry eyes).   Co Q 10 100 MG Caps Take 100 mg by mouth daily.   Fish Oil 1000 MG Caps Take 1,000 mg by mouth daily.   fluticasone 50 MCG/ACT nasal spray Commonly known as: FLONASE SPRAY 2 SPRAYS INTO EACH NOSTRIL EVERY DAY   hydrochlorothiazide 12.5 MG capsule Commonly known as: MICROZIDE Take 2 capsules (25 mg total) by mouth daily.   methimazole 5 MG tablet Commonly known as: TAPAZOLE Take 1 tablet (5 mg total) by mouth every Monday, Wednesday, and Friday.   metoprolol tartrate  25 MG tablet Commonly known as: LOPRESSOR Take 1 tablet (25 mg total) by mouth 2 (two) times daily.   pantoprazole 40 MG tablet Commonly known as: PROTONIX Take 1 tablet (40 mg total) by mouth 2 (two) times daily.   rosuvastatin 10 MG tablet Commonly known as: CRESTOR Take 1 tablet (10 mg total) by mouth daily.   tamsulosin 0.4 MG Caps capsule Commonly known as: FLOMAX Take 1 capsule (0.4 mg total) by mouth daily.   telmisartan 40 MG tablet Commonly known as: MICARDIS Take 0.5 tablets (20 mg total) by mouth daily.   Vitamin D3 50 MCG (2000 UT) Tabs Take 2,000 Units by mouth  daily.        Objective:   BP (!) 149/76   Pulse 68   Temp (!) 97 F (36.1 C)   Ht '5\' 7"'  (1.702 m)   Wt 180 lb (81.6 kg)   SpO2 99%   BMI 28.19 kg/m   Wt Readings from Last 3 Encounters:  04/27/20 180 lb (81.6 kg)  02/20/20 178 lb (80.7 kg)  12/26/19 174 lb (78.9 kg)    Physical Exam Vitals and nursing note reviewed.  Constitutional:      General: He is not in acute distress.    Appearance: He is well-developed. He is not diaphoretic.  Eyes:     General: No scleral icterus.    Conjunctiva/sclera: Conjunctivae normal.  Neck:     Thyroid: No thyromegaly.  Cardiovascular:     Rate and Rhythm: Normal rate and regular rhythm.     Heart sounds: Normal heart sounds. No murmur heard.   Pulmonary:     Effort: Pulmonary effort is normal. No respiratory distress.     Breath sounds: Normal breath sounds. No wheezing.  Musculoskeletal:        General: Normal range of motion.     Cervical back: Neck supple.  Lymphadenopathy:     Cervical: No cervical adenopathy.  Skin:    General: Skin is warm and dry.     Findings: No rash.  Neurological:     Mental Status: He is alert and oriented to person, place, and time.     Coordination: Coordination normal.  Psychiatric:        Behavior: Behavior normal.     Results for orders placed or performed in visit on 04/24/20  CMP14+EGFR  Result Value Ref Range   Glucose 96 65 - 99 mg/dL   BUN 14 8 - 27 mg/dL   Creatinine, Ser 1.34 (H) 0.76 - 1.27 mg/dL   GFR calc non Af Amer 50 (L) >59 mL/min/1.73   GFR calc Af Amer 57 (L) >59 mL/min/1.73   BUN/Creatinine Ratio 10 10 - 24   Sodium 143 134 - 144 mmol/L   Potassium 4.2 3.5 - 5.2 mmol/L   Chloride 105 96 - 106 mmol/L   CO2 24 20 - 29 mmol/L   Calcium 9.5 8.6 - 10.2 mg/dL   Total Protein 7.1 6.0 - 8.5 g/dL   Albumin 4.6 3.7 - 4.7 g/dL   Globulin, Total 2.5 1.5 - 4.5 g/dL   Albumin/Globulin Ratio 1.8 1.2 - 2.2   Bilirubin Total 0.8 0.0 - 1.2 mg/dL   Alkaline Phosphatase 78 44 -  121 IU/L   AST 20 0 - 40 IU/L   ALT 17 0 - 44 IU/L  PSA, total and free  Result Value Ref Range   Prostate Specific Ag, Serum 3.2 0.0 - 4.0 ng/mL   PSA, Free 0.53 N/A  ng/mL   PSA, Free Pct 16.6 %  TSH  Result Value Ref Range   TSH 3.580 0.450 - 4.500 uIU/mL    Assessment & Plan:   Problem List Items Addressed This Visit      Cardiovascular and Mediastinum   Hypertension   Relevant Medications   hydrochlorothiazide (MICROZIDE) 12.5 MG capsule   metoprolol tartrate (LOPRESSOR) 25 MG tablet   rosuvastatin (CRESTOR) 10 MG tablet   telmisartan (MICARDIS) 40 MG tablet   Other Relevant Orders   CBC with Differential/Platelet   CMP14+EGFR     Endocrine   Graves disease   Relevant Medications   methimazole (TAPAZOLE) 5 MG tablet   metoprolol tartrate (LOPRESSOR) 25 MG tablet   Other Relevant Orders   CBC with Differential/Platelet   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 - Primary   Relevant Medications   methimazole (TAPAZOLE) 5 MG tablet   metoprolol tartrate (LOPRESSOR) 25 MG tablet   Other Relevant Orders   CBC with Differential/Platelet   TSH     Other   Hyperlipidemia   Relevant Medications   hydrochlorothiazide (MICROZIDE) 12.5 MG capsule   metoprolol tartrate (LOPRESSOR) 25 MG tablet   rosuvastatin (CRESTOR) 10 MG tablet   telmisartan (MICARDIS) 40 MG tablet   Other Relevant Orders   Lipid panel      Continue current medication, no changes, blood work looks good, will see back in 4 months. Follow up plan: Return in about 4 months (around 08/25/2020), or if symptoms worsen or fail to improve, for Hypertension and thyroid and cholesterol.  Counseling provided for all of the vaccine components No orders of the defined types were placed in this encounter.   Caryl Pina, MD [facility] 04/27/2020, 11:01 AM

## 2020-04-28 ENCOUNTER — Other Ambulatory Visit: Payer: Self-pay | Admitting: Family Medicine

## 2020-04-28 DIAGNOSIS — J301 Allergic rhinitis due to pollen: Secondary | ICD-10-CM

## 2020-05-27 ENCOUNTER — Ambulatory Visit (INDEPENDENT_AMBULATORY_CARE_PROVIDER_SITE_OTHER): Payer: Medicare HMO | Admitting: *Deleted

## 2020-05-27 VITALS — Ht 67.0 in | Wt 179.9 lb

## 2020-05-27 DIAGNOSIS — Z0000 Encounter for general adult medical examination without abnormal findings: Secondary | ICD-10-CM | POA: Diagnosis not present

## 2020-05-27 NOTE — Progress Notes (Signed)
MEDICARE ANNUAL WELLNESS VISIT  05/27/2020  Telephone Visit Disclaimer This Medicare AWV was conducted by telephone due to national recommendations for restrictions regarding the COVID-19 Pandemic (e.g. social distancing).  I verified, using two identifiers, that I am speaking with Mario Smith or their authorized healthcare agent. I discussed the limitations, risks, security, and privacy concerns of performing an evaluation and management service by telephone and the potential availability of an in-person appointment in the future. The patient expressed understanding and agreed to proceed.  Location of Patient: Home Location of Provider (nurse):  Office  Subjective:    Mario Smith is a 79 y.o. male patient of Dettinger, Fransisca Kaufmann, MD who had a Medicare Annual Wellness Visit today via telephone. Arnell is Retired and lives with their spouse. he has 1 child. he reports that he is socially active and does interact with friends/family regularly. he is minimally physically active and enjoys hunting.  Patient Care Team: Dettinger, Fransisca Kaufmann, MD as PCP - General (Family Medicine) Inda Castle, MD (Inactive) as Attending Physician (Gastroenterology) Clista Bernhardt, MD as Referring Physician (Endocrinology) Arta Bruce, OD as Referring Physician (Ophthalmology)  Advanced Directives 05/27/2020 05/24/2019 01/10/2018 10/10/2017 02/11/2016 05/16/2014 05/15/2014  Does Patient Have a Medical Advance Directive? Yes Yes Yes Yes Yes Yes Yes  Type of Paramedic of [place];Living will Living will Living will Living will [place] will Living will  Does patient want to make changes to medical advance directive? No - Patient declined No - Patient declined No - Patient declined No - Patient declined - No - Patient declined No - Patient declined  Copy of [place] in Chart? No - copy requested - - - - No - copy requested [facility] Utilization Over the Past 12 Months: # of hospitalizations or ER visits: 0 # of surgeries: 0  Review of Systems    Patient reports that his overall health is unchanged compared to last year.  History obtained from chart review and the patient General ROS: negative  Patient Reported Readings (BP, Pulse, CBG, Weight, etc) none  Pain Assessment Pain : No/denies pain     Current Medications & Allergies (verified) Allergies as of 05/27/2020      Reactions   Simvastatin Other (See Comments)   Leg cramps   Sulfa Antibiotics    UNSPECIFIED REACTION    Codeine Nausea And Vomiting   Hydrocodone Nausea And Vomiting   Meclizine Nausea Only   Niacin And Related Other (See Comments)   Flushing in the face and redness    Ondansetron Other (See Comments)   Tremors   Phenergan [promethazine Hcl] Other (See Comments)   Tremors      Medication List       Accurate as of May 27, 2020  3:14 PM. If you have any questions, ask your nurse or doctor.        carboxymethylcellulose 0.5 % Soln Commonly known as: REFRESH PLUS Place 1 drop into both eyes as needed (dry eyes).   Co Q 10 100 MG Caps Take 100 mg by mouth daily.   Fish Oil 1000 MG Caps Take 1,000 mg by mouth daily.   fluticasone 50 MCG/ACT nasal spray Commonly known as: FLONASE SPRAY 2 SPRAYS INTO EACH NOSTRIL EVERY DAY   hydrochlorothiazide 12.5 MG capsule Commonly known as: MICROZIDE Take 2 capsules (25 mg total) by mouth daily.   methimazole 5 MG tablet  Commonly known as: TAPAZOLE Take 1 tablet (5 mg total) by mouth every Monday, Wednesday, and Friday.   metoprolol tartrate 25 MG tablet Commonly known as: LOPRESSOR Take 1 tablet (25 mg total) by mouth 2 (two) times daily.   pantoprazole 40 MG tablet Commonly known as: PROTONIX Take 1 tablet (40 mg total) by mouth 2 (two) times daily.   rosuvastatin 10 MG tablet Commonly known as: CRESTOR Take 1 tablet (10 mg total) by mouth daily.    tamsulosin 0.4 MG Caps capsule Commonly known as: FLOMAX Take 1 capsule (0.4 mg total) by mouth daily.   telmisartan 40 MG tablet Commonly known as: MICARDIS Take 0.5 tablets (20 mg total) by mouth daily.   Vitamin D3 50 MCG (2000 UT) Tabs Take 2,000 Units by mouth daily.       History (reviewed): Past Medical History:  Diagnosis Date  . Abdominal pain 10/10/2017  . Arthritis   . Back pain   . Colon polyp 2011   tubular adenoma.  no HG dysplasia.    . Constipation   . COPD (chronic obstructive pulmonary disease) (HCC)    no per pt  . Dehydration 10/10/2017  . Dyspnea    with exertion  . ED (erectile dysfunction)   . Essential hypertension, benign   . Gallbladder sludge 10/2017  . GERD (gastroesophageal reflux disease)    acute gastritis  . Graves disease   . History of kidney stones   . HTN (hypertension)   . Other and unspecified hyperlipidemia   . PONV (postoperative nausea and vomiting)    Pt's wife also said that when he had a nerve block in 2015 here, pt had a reaction immediately. States pt got very shaky, lost his voice.,Pt does not want a nerve block again  . Thrombocytopenia ([place])   . 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 Vertigo    Past Surgical History:  Procedure Laterality Date  . CATARACT EXTRACTION Bilateral   . CHOLECYSTECTOMY N/A 01/18/2018   Procedure: LAPAROSCOPIC CHOLECYSTECTOMY WITH INTRAOPERATIVE CHOLANGIOGRAM ERAS PATHWAY;  Surgeon: Jovita Kussmaul, MD;  Location: [place];  Service: General;  Laterality: N/A;  . COLONOSCOPY  03/2010    Dr Thomasenia Sales, [facility]. Higher risk screening study, sister's hx of colon CA.    Marland Kitchen ESOPHAGOGASTRODUODENOSCOPY (EGD) WITH PROPOFOL N/A 10/11/2017   Procedure: ESOPHAGOGASTRODUODENOSCOPY (EGD) WITH PROPOFOL;  Surgeon: Mauri Pole, MD;  Location: [facility];  Service: Endoscopy;  Laterality: N/A;  . EYE SURGERY     lid-lift  . SPINAL FUSION  2001   cervical  . TENDON  REPAIR Right 05/16/2014   Procedure: RIGHT RING FINGER FLEXOR TENDON REPAIR WITH PULL OUT BUTTON;  Surgeon: Roseanne Kaufman, MD;  Location: [place];  Service: Orthopedics;  Laterality: Right;  . UPPER GASTROINTESTINAL ENDOSCOPY     Family History  Problem Relation Age of Onset  . Heart disease Mother   . COPD Mother   . Heart disease Father   . COPD Father   . Alcohol abuse Father   . Cancer Sister        colon cancer  . Emphysema Sister   . Colon cancer Sister   . Cancer Brother        prostate  . Emphysema Sister   . Prostate cancer Brother   . Healthy Daughter   . Esophageal cancer Neg Hx   . Stomach cancer Neg Hx   . Rectal cancer Neg  Hx    Social History   Socioeconomic History  . Marital status: Married    Spouse name: Mary  . Number of children: 1  . Years of education: 8  . Highest education level: 8th grade  Occupational History  . Occupation: retired  Tobacco Use  . Smoking status: Former Smoker    Types: Cigarettes    Quit date: 12/19/1992    Years since quitting: 27.4  . Smokeless tobacco: Former Systems developer    Types: [place] date: 12/19/1992  Vaping Use  . Vaping Use: Never used  Substance and Sexual Activity  . Alcohol use: Not Currently  . Drug use: No  . Sexual activity: Yes  Other Topics Concern  . Not on file  Social History Narrative  . Not on file   Social Determinants of Health   Financial Resource Strain: Low Risk   . Difficulty of Paying Living Expenses: Not hard at all  Food Insecurity: No Food Insecurity  . Worried About Charity fundraiser in the Last Year: Never true  . Ran Out of Food in the Last Year: Never true  Transportation Needs: No Transportation Needs  . Lack of Transportation (Medical): No  . Lack of Transportation (Non-Medical): No  Physical Activity: Inactive  . Days of Exercise per Week: 0 days  . Minutes of Exercise per Session: 0 min  Stress: No Stress Concern Present  . Feeling of Stress : Not  at all  Social Connections: Socially Integrated  . Frequency of Communication with Friends and Family: More than three times a week  . Frequency of Social Gatherings with Friends and Family: More than three times a week  . Attends Religious Services: More than 4 times per year  . Active Member of Clubs or Organizations: Yes  . Attends Archivist Meetings: More than 4 times per year  . Marital Status: Married    Activities of Daily Living In your present state of health, do you have any difficulty performing the following activities: 05/27/2020  Hearing? N  Vision? N  Difficulty concentrating or making decisions? N  Walking or climbing stairs? N  Dressing or bathing? N  Doing errands, shopping? N  Preparing Food and eating ? N  Using the Toilet? N  In the past six months, have you accidently leaked urine? N  Do you have problems with loss of bowel control? N  Managing your Medications? N  Managing your Finances? N  Housekeeping or managing your Housekeeping? N  Some recent data might be hidden    Patient Education/ Literacy How often do you need to have someone help you when you read instructions, pamphlets, or other written materials from your doctor or pharmacy?: 1 - Never What is the last grade level you completed in school?: 8th Grade  Exercise Current Exercise Habits: The patient does not participate in regular exercise at present, Exercise limited by: None identified  Diet Patient reports consuming 3 meals a day and 2 snack(s) a day Patient reports that his primary diet is: Regular Patient reports that she does have regular access to food.   Depression Screen PHQ 2/9 Scores 05/27/2020 05/27/2020 04/27/2020 02/20/2020 12/26/2019 08/29/2019 05/24/2019  PHQ - 2 Score 0 0 0 0 0 0 0     Fall Risk Fall Risk  05/27/2020 04/27/2020 02/20/2020 12/26/2019 08/29/2019  Falls in the past year? 0 0 1 0 0  Number falls in past yr: 0 - 0 - -  Injury  with Fall? 0 - 0 - -   Comment - - - - -  Risk for fall due to : No Fall Risks - Impaired balance/gait - -  Follow up Falls evaluation completed - Falls evaluation completed - -     Objective:  Dominiq R Pomplun seemed alert and oriented and he participated appropriately during our telephone visit.  Blood Pressure Weight BMI  BP Readings from Last 3 Encounters:  04/27/20 (!) 149/76  02/20/20 138/72  12/26/19 (!) 129/80   Wt Readings from Last 3 Encounters:  05/27/20 179 lb 14.3 oz (81.6 kg)  04/27/20 180 lb (81.6 kg)  02/20/20 178 lb (80.7 kg)   BMI Readings from Last 1 Encounters:  05/27/20 28.18 kg/m    *Unable to obtain current vital signs, weight, and BMI due to telephone visit type  Hearing/Vision  . Mathayus did  seem to have difficulty with hearing/understanding during the telephone conversation . Reports that he has not had a formal eye exam by an eye care professional within the past year . Reports that he has not had a formal hearing evaluation within the past year *Unable to fully assess hearing and vision during telephone visit type  Cognitive Function: 6CIT Screen 05/27/2020 05/24/2019  What Year? 0 points 0 points  What month? 0 points 0 points  What time? 0 points 0 points  Count back from 20 0 points 2 points  Months in reverse 0 points 2 points  Repeat phrase 0 points 0 points  Total Score 0 4   (Normal:0-7, Significant for Dysfunction: >8)  Normal Cognitive Function Screening: Yes   Immunization & Health Maintenance Record Immunization History  Administered Date(s) Administered  . Fluad Quad(high Dose 65+) 03/21/2019, 03/30/2020  . Influenza Whole 11/04/2009  . Influenza, High Dose Seasonal PF 04/02/2015, 04/19/2016, 03/09/2017, 03/08/2018  . Influenza,inj,Quad PF,6+ Mos 03/27/2013, 04/16/2014  . Moderna Sars-Covid-2 Vaccination 07/16/2019, 08/13/2019  . Pneumococcal Conjugate-13 05/27/2013  . Pneumococcal Polysaccharide-23 01/12/2007  . Td 12/08/2002, 03/16/2011  . Tdap  03/16/2011  . Zoster 09/05/2006    Health Maintenance  Topic Date Due  . COVID-19 Vaccine (3 - Booster for Moderna series) 02/13/2020  . TETANUS/TDAP  03/15/2021  . INFLUENZA VACCINE  Completed  . PNA vac Low Risk Adult  Completed       Assessment  This is a routine wellness examination for Mario Smith.  Health Maintenance: Due or Overdue Health Maintenance Due  Topic Date Due  . COVID-19 Vaccine (3 - Booster for Moderna series) 02/13/2020    Daxon R Berwanger does not need a referral for Community Assistance: Care Management:   no Social Work:    no Prescription Assistance:  no Nutrition/Diabetes Education:  no   Plan:  Personalized Goals Goals Addressed            This Visit's Progress   . Exercise 3x per week (30 min per time)       Walk for 30 minutes 3 times a week as tolerated. May need to build up to 30 minutes slowly.   05/27/2020 AWV Goal: Exercise for General Health   Patient will verbalize understanding of the benefits of increased physical activity:  Exercising regularly is important. It will improve your overall fitness, flexibility, and endurance.  Regular exercise also will improve your overall health. It can help you control your weight, reduce stress, and improve your bone density.  Over the next year, patient will increase physical activity as tolerated with a goal of at  least 150 minutes of moderate physical activity per week.   You can tell that you are exercising at a moderate intensity if your heart starts beating faster and you start breathing faster but can still hold a conversation.  Moderate-intensity exercise ideas include:  Walking 1 mile (1.6 km) in about 15 minutes  Biking  Hiking  Golfing  Dancing  Water aerobics  Patient will verbalize understanding of everyday activities that increase physical activity by providing examples like the following: ? Yard work, such as: ? Pushing a Conservation officer, nature ? Raking and bagging  leaves ? Washing your car ? Pushing a stroller ? Shoveling snow ? Gardening ? Washing windows or floors  Patient will be able to explain general safety guidelines for exercising:   Before you start a new exercise program, talk with your health care provider.  Do not exercise so much that you hurt yourself, feel dizzy, or get very short of breath.  Wear comfortable clothes and wear shoes with good support.  Drink plenty of water while you exercise to prevent dehydration or heat stroke.  Work out until your breathing and your heartbeat get faster.       Personalized Health Maintenance & Screening Recommendations  Advanced directives: has an advanced directive - a copy HAS NOT been provided.  Lung Cancer Screening Recommended: no (Low Dose CT Chest recommended if Age 78-80 years, 30 pack-year currently smoking OR have quit w/in past 15 years) Hepatitis C Screening recommended: no HIV Screening recommended: no  Advanced Directives: Written information was not prepared per patient's request.  Referrals & Orders No orders of the defined types were placed in this encounter.   Follow-up Plan . Follow-up with Dettinger, Fransisca Kaufmann, MD as planned   I have personally reviewed and noted the following in the patient's chart:   . Medical and social history . Use of alcohol, tobacco or illicit drugs  . Current medications and supplements . Functional ability and status . Nutritional status . Physical activity . Advanced directives . List of other physicians . Hospitalizations, surgeries, and ER visits in previous 12 months . Vitals . Screenings to include cognitive, depression, and falls . Referrals and appointments  In addition, I have reviewed and discussed with Carolos R Raus certain preventive protocols, quality metrics, and best practice recommendations. A written personalized care plan for preventive services as well as general preventive health recommendations is available  and can be mailed to the patient at his request.      Wardell Heath, LPN  [id number]    AVS Printed and Mailed to patient

## 2020-06-26 ENCOUNTER — Ambulatory Visit: Payer: Medicare HMO

## 2020-06-26 ENCOUNTER — Ambulatory Visit (INDEPENDENT_AMBULATORY_CARE_PROVIDER_SITE_OTHER): Payer: Medicare HMO | Admitting: Family Medicine

## 2020-06-26 ENCOUNTER — Encounter: Payer: Self-pay | Admitting: Family Medicine

## 2020-06-26 DIAGNOSIS — R0602 Shortness of breath: Secondary | ICD-10-CM | POA: Diagnosis not present

## 2020-06-26 DIAGNOSIS — J019 Acute sinusitis, unspecified: Secondary | ICD-10-CM

## 2020-06-26 MED ORDER — AMOXICILLIN-POT CLAVULANATE 875-125 MG PO TABS: 1.0000 | ORAL_TABLET | Freq: Two times a day (BID) | ORAL | 0 refills | Status: AC

## 2020-06-26 MED ORDER — ALBUTEROL SULFATE HFA 108 (90 BASE) MCG/ACT IN AERS: 2.0000 | INHALATION_SPRAY | Freq: Four times a day (QID) | RESPIRATORY_TRACT | 1 refills | Status: DC | PRN

## 2020-06-26 MED ORDER — GUAIFENESIN ER 600 MG PO TB12: ORAL_TABLET | ORAL | 0 refills | Status: DC

## 2020-06-26 NOTE — Progress Notes (Signed)
Virtual Visit via telephone Note Due to COVID-19 pandemic this visit was conducted virtually. This visit type was conducted due to national recommendations for restrictions regarding the COVID-19 Pandemic (e.g. social distancing, sheltering in place) in an effort to limit this patient's exposure and mitigate transmission in our community. All issues noted in this document were discussed and addressed.  A physical exam was not performed with this format.  I connected with Mario Smith on 06/26/20 at 0954 by telephone and verified that I am speaking with the correct person using two identifiers. Mario Smith is currently located at home and his wife is currently with him during the visit. The provider, Gwenlyn Perking, FNP is located in their office at time of visit.  I discussed the limitations, risks, security and privacy concerns of performing an evaluation and management service by telephone and the availability of in person appointments. I also discussed with the patient that there may be a patient responsible charge related to this service. The patient expressed understanding and agreed to proceed.  CC: congestion  History and Present Illness:  HPI  Mario Smith reports head and chest congestion for at least a month. He reports a worsening in his symptoms over the last week with sinus pressure and a headache. He has also had a cough for at least a month. He reports coughing up some thick clear mucus at times. He reports mild shortness of breath daily with some wheezing occasionally. He denies fever, body aches, or chills. He was given an allergy pill and flonase initially by his PCP but reports that this has not really helped. He has not tried any other remedies. Denies chest pain, sore throat, or vomiting.   ROS As per HPI.   Observations/Objective: Alert and oriented x 3. Able to speak in full sentences without difficulty.   Assessment and Plan: Diagnoses and all orders for this  visit:  Acute non-recurrent sinusitis, unspecified location Will treat with antibiotic as symptoms have recently worsened. Mucinex for congestion. Continue allergy medication. OTC cough medication as needed for cough. Push fluids, rest. Return to office for new or worsening symptoms, or if symptoms persist.  -     amoxicillin-clavulanate (AUGMENTIN) 875-125 MG tablet; Take 1 tablet by mouth 2 (two) times daily for 7 days. -     guaiFENesin (MUCINEX) 600 MG 12 hr tablet; Take 1-2 tablets every 12 hours as needed for congestion.  Shortness of breath Mild with occasional wheezing. Albuterol as needed. Return to office for new or worsening symptoms, or if symptoms persist.  -     albuterol (VENTOLIN HFA) 108 (90 Base) MCG/ACT inhaler; Inhale 2 puffs into the lungs every 6 (six) hours as needed for wheezing or shortness of breath.    Follow Up Instructions: As needed.     I discussed the assessment and treatment plan with the patient. The patient was provided an opportunity to ask questions and all were answered. The patient agreed with the plan and demonstrated an understanding of the instructions.   The patient was advised to call back or seek an in-person evaluation if the symptoms worsen or if the condition fails to improve as anticipated.  The above assessment and management plan was discussed with the patient. The patient verbalized understanding of and has agreed to the management plan. Patient is aware to call the clinic if symptoms persist or worsen. Patient is aware when to return to the clinic for a follow-up visit. Patient educated on when it  is appropriate to go to the emergency department.   Time call ended:  [place], [place]

## 2020-07-02 ENCOUNTER — Telehealth: Payer: Self-pay

## 2020-07-02 NOTE — Telephone Encounter (Signed)
APPOINTMENT GIVEN FOR COVID CLINIC TOMORROW.

## 2020-07-02 NOTE — Telephone Encounter (Signed)
Pt is really wanting to get xray

## 2020-07-02 NOTE — Telephone Encounter (Signed)
He will need to been seen in the Covid clinic since he has respiratory symptoms. Unless he would rather go to an urgent care. He should be seen in person at this point since his symptoms are not improving.

## 2020-07-03 ENCOUNTER — Ambulatory Visit (INDEPENDENT_AMBULATORY_CARE_PROVIDER_SITE_OTHER): Payer: Medicare HMO | Admitting: Nurse Practitioner

## 2020-07-03 ENCOUNTER — Ambulatory Visit (INDEPENDENT_AMBULATORY_CARE_PROVIDER_SITE_OTHER): Payer: Medicare HMO

## 2020-07-03 ENCOUNTER — Encounter: Payer: Self-pay | Admitting: Nurse Practitioner

## 2020-07-03 VITALS — BP 138/80 | HR 72 | Temp 97.0°F

## 2020-07-03 DIAGNOSIS — R059 Cough, unspecified: Secondary | ICD-10-CM

## 2020-07-03 DIAGNOSIS — R062 Wheezing: Secondary | ICD-10-CM | POA: Diagnosis not present

## 2020-07-03 MED ORDER — AZITHROMYCIN 250 MG PO TABS: ORAL_TABLET | ORAL | 0 refills | Status: DC

## 2020-07-03 MED ORDER — PREDNISONE 10 MG (21) PO TBPK: ORAL_TABLET | ORAL | 0 refills | Status: DC

## 2020-07-03 NOTE — Assessment & Plan Note (Signed)
Symptoms of worsening cough and congestion  not well managed in the last 14 days. covid-19 test completed,  Started patient on z-pack, prednisone taper, Muccinex and chest x-ray completed.  Rx sent to pharmacy,  Education provided with printed hand out given

## 2020-07-03 NOTE — Patient Instructions (Signed)
10 Things You Can Do to Manage Your COVID-19 Symptoms at Home If you have possible or confirmed COVID-19: 1. Stay home except to get medical care. 2. Monitor your symptoms carefully. If your symptoms get worse, call your healthcare provider immediately. 3. Get rest and stay hydrated. 4. If you have a medical appointment, call the healthcare provider ahead of time and tell them that you have or may have COVID-19. 5. For medical emergencies, call 911 and notify the dispatch personnel that you have or may have COVID-19. 6. Cover your cough and sneezes with a tissue or use the inside of your elbow. 7. Wash your hands often with soap and water for at least 20 seconds or clean your hands with an alcohol-based hand sanitizer that contains at least 60% alcohol. 8. As much as possible, stay in a specific room and away from other people in your home. Also, you should use a separate bathroom, if available. If you need to be around other people in or outside of the home, wear a mask. 9. Avoid sharing personal items with other people in your household, like dishes, towels, and bedding. 10. Clean all surfaces that are touched often, like counters, tabletops, and doorknobs. Use household cleaning sprays or wipes according to the label instructions. cdc.gov/coronavirus 12/20/2019 This information is not intended to replace advice given to you by your health care provider. Make sure you discuss any questions you have with your health care provider. Document Revised: 04/06/2020 Document Reviewed: 04/06/2020 Elsevier Patient Education  2021 Elsevier Inc. Cough, Adult A cough helps to clear your throat and lungs. A cough may be a sign of an illness or another medical condition. An acute cough may only last 2-3 weeks, while a chronic cough may last 8 or more weeks. Many things can cause a cough. They include:  Germs (viruses or bacteria) that attack the airway.  Breathing in things that bother (irritate) your  lungs.  Allergies.  Asthma.  Mucus that runs down the back of your throat (postnasal drip).  Smoking.  Acid backing up from the stomach into the tube that moves food from the mouth to the stomach (gastroesophageal reflux).  Some medicines.  Lung problems.  Other medical conditions, such as heart failure or a blood clot in the lung (pulmonary embolism). Follow these instructions at home: Medicines  Take over-the-counter and prescription medicines only as told by your doctor.  Talk with your doctor before you take medicines that stop a cough (cough suppressants). Lifestyle  Do not smoke, and try not to be around smoke. Do not use any products that contain nicotine or tobacco, such as cigarettes, e-cigarettes, and chewing tobacco. If you need help quitting, ask your doctor.  Drink enough fluid to keep your pee (urine) pale yellow.  Avoid caffeine.  Do not drink alcohol if your doctor tells you not to drink.   General instructions  Watch for any changes in your cough. Tell your doctor about them.  Always cover your mouth when you cough.  Stay away from things that make you cough, such as perfume, candles, campfire smoke, or cleaning products.  If the air is dry, use a cool mist vaporizer or humidifier in your home.  If your cough is worse at night, try using extra pillows to raise your head up higher while you sleep.  Rest as needed.  Keep all follow-up visits as told by your doctor. This is important.   Contact a doctor if:  You have new symptoms.    You cough up pus.  Your cough does not get better after 2-3 weeks, or your cough gets worse.  Cough medicine does not help your cough and you are not sleeping well.  You have pain that gets worse or pain that is not helped with medicine.  You have a fever.  You are losing weight and you do not know why.  You have night sweats. Get help right away if:  You cough up blood.  You have trouble breathing.  Your  heartbeat is very fast. These symptoms may be an emergency. Do not wait to see if the symptoms will go away. Get medical help right away. Call your local emergency services (911 in the [place] Do not drive yourself to the hospital. Summary  A cough helps to clear your throat and lungs. Many things can cause a cough.  Take over-the-counter and prescription medicines only as told by your doctor.  Always cover your mouth when you cough.  Contact a doctor if you have new symptoms or you have a cough that does not get better or gets worse. This information is not intended to replace advice given to you by your health care provider. Make sure you discuss any questions you have with your health care provider. Document Revised: 07/12/2019 Document Reviewed: 06/11/2018 Elsevier Patient Education  2021 Elsevier Inc.  

## 2020-07-03 NOTE — Progress Notes (Signed)
Acute Office Visit  Subjective:    Patient ID: Mario Smith, male    DOB: 1941/04/14, 80 y.o.   MRN: [id number]  Chief Complaint  Patient presents with  . Cough  . Shortness of Breath    Chest congestion     Cough This is a new problem. Episode onset: in the past 14 days. The problem has been gradually worsening. The problem occurs constantly. The cough is non-productive. Associated symptoms include nasal congestion, shortness of breath and wheezing. Pertinent negatives include no chest pain, ear congestion, fever, headaches or sore throat.  Shortness of Breath Associated symptoms include wheezing. Pertinent negatives include no chest pain, fever, headaches or sore throat.    Past Medical History:  Diagnosis Date  . Abdominal pain 10/10/2017  . Arthritis   . Back pain   . Colon polyp 2011   tubular adenoma.  no HG dysplasia.    . Constipation   . COPD (chronic obstructive pulmonary disease) (HCC)    no per pt  . Dehydration 10/10/2017  . Dyspnea    with exertion  . ED (erectile dysfunction)   . Essential hypertension, benign   . Gallbladder sludge 10/2017  . GERD (gastroesophageal reflux disease)    acute gastritis  . Graves disease   . History of kidney stones   . HTN (hypertension)   . Other and unspecified hyperlipidemia   . PONV (postoperative nausea and vomiting)    Pt's wife also said that when he had a nerve block in 2015 here, pt had a reaction immediately. States pt got very shaky, lost his voice.,Pt does not want a nerve block again  . Thrombocytopenia ([place])   . 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 Vertigo     Past Surgical History:  Procedure Laterality Date  . CATARACT EXTRACTION Bilateral   . CHOLECYSTECTOMY N/A 01/18/2018   Procedure: LAPAROSCOPIC CHOLECYSTECTOMY WITH INTRAOPERATIVE CHOLANGIOGRAM ERAS PATHWAY;  Surgeon: Jovita Kussmaul, MD;  Location: [facility];  Service: General;  Laterality: N/A;  . COLONOSCOPY   03/2010    Dr Thomasenia Sales, [facility]. Higher risk screening study, sister's hx of colon CA.    Marland Kitchen ESOPHAGOGASTRODUODENOSCOPY (EGD) WITH PROPOFOL N/A 10/11/2017   Procedure: ESOPHAGOGASTRODUODENOSCOPY (EGD) WITH PROPOFOL;  Surgeon: Mauri Pole, MD;  Location: [facility];  Service: Endoscopy;  Laterality: N/A;  . EYE SURGERY     lid-lift  . SPINAL FUSION  2001   cervical  . TENDON REPAIR Right 05/16/2014   Procedure: RIGHT RING FINGER FLEXOR TENDON REPAIR WITH PULL OUT BUTTON;  Surgeon: Roseanne Kaufman, MD;  Location: [facility];  Service: Orthopedics;  Laterality: Right;  . UPPER GASTROINTESTINAL ENDOSCOPY      Family History  Problem Relation Age of Onset  . Heart disease Mother   . COPD Mother   . Heart disease Father   . COPD Father   . Alcohol abuse Father   . Cancer Sister        colon cancer  . Emphysema Sister   . Colon cancer Sister   . Cancer Brother        prostate  . Emphysema Sister   . Prostate cancer Brother   . Healthy Daughter   . Esophageal cancer Neg Hx   . Stomach cancer Neg Hx   . Rectal cancer Neg Hx     Social History   Socioeconomic History  . Marital status: Married    Spouse  name: Stanton Kidney  . Number of children: 1  . Years of education: 8  . Highest education level: 8th grade  Occupational History  . Occupation: retired  Tobacco Use  . Smoking status: Former Smoker    Types: Cigarettes    Quit date: 12/19/1992    Years since quitting: 27.5  . Smokeless tobacco: Former Systems developer    Types: [place] date: 12/19/1992  Vaping Use  . Vaping Use: Never used  Substance and Sexual Activity  . Alcohol use: Not Currently  . Drug use: No  . Sexual activity: Yes  Other Topics Concern  . Not on file  Social History Narrative  . Not on file   Social Determinants of Health   Financial Resource Strain: Low Risk   . Difficulty of Paying Living Expenses: Not hard at all  Food Insecurity: No Food Insecurity  . Worried About  Charity fundraiser in the Last Year: Never true  . Ran Out of Food in the Last Year: Never true  Transportation Needs: No Transportation Needs  . Lack of Transportation (Medical): No  . Lack of Transportation (Non-Medical): No  Physical Activity: Inactive  . Days of Exercise per Week: 0 days  . Minutes of Exercise per Session: 0 min  Stress: No Stress Concern Present  . Feeling of Stress : Not at all  Social Connections: Socially Integrated  . Frequency of Communication with Friends and Family: More than three times a week  . Frequency of Social Gatherings with Friends and Family: More than three times a week  . Attends Religious Services: More than 4 times per year  . Active Member of Clubs or Organizations: Yes  . Attends Archivist Meetings: More than 4 times per year  . Marital Status: Married  Human resources officer Violence: Not At Risk  . Fear of Current or Ex-Partner: No  . Emotionally Abused: No  . Physically Abused: No  . Sexually Abused: No    Outpatient Medications Prior to Visit  Medication Sig Dispense Refill  . albuterol (VENTOLIN HFA) 108 (90 Base) MCG/ACT inhaler Inhale 2 puffs into the lungs every 6 (six) hours as needed for wheezing or shortness of breath. 18 g 1  . amoxicillin-clavulanate (AUGMENTIN) 875-125 MG tablet Take 1 tablet by mouth 2 (two) times daily for 7 days. 14 tablet 0  . carboxymethylcellulose (REFRESH PLUS) 0.5 % SOLN Place 1 drop into both eyes as needed (dry eyes).    . Cholecalciferol (VITAMIN D3) 2000 UNITS TABS Take 2,000 Units by mouth daily.    . Coenzyme Q10 (CO Q 10) 100 MG CAPS Take 100 mg by mouth daily.    . fluticasone (FLONASE) 50 MCG/ACT nasal spray SPRAY 2 SPRAYS INTO EACH NOSTRIL EVERY DAY 48 mL 1  . guaiFENesin (MUCINEX) 600 MG 12 hr tablet Take 1-2 tablets every 12 hours as needed for congestion. 30 tablet 0  . hydrochlorothiazide (MICROZIDE) 12.5 MG capsule Take 2 capsules (25 mg total) by mouth daily. 180 capsule 3  .  methimazole (TAPAZOLE) 5 MG tablet Take 1 tablet (5 mg total) by mouth every Monday, Wednesday, and Friday. 90 tablet 3  . metoprolol tartrate (LOPRESSOR) 25 MG tablet Take 1 tablet (25 mg total) by mouth 2 (two) times daily. 180 tablet 3  . Omega-3 Fatty Acids (FISH OIL) 1000 MG CAPS Take 1,000 mg by mouth daily.    . pantoprazole (PROTONIX) 40 MG tablet Take 1 tablet (40 mg total) by mouth 2 (  two) times daily. 180 tablet 3  . rosuvastatin (CRESTOR) 10 MG tablet Take 1 tablet (10 mg total) by mouth daily. 90 tablet 3  . tamsulosin (FLOMAX) 0.4 MG CAPS capsule Take 1 capsule (0.4 mg total) by mouth daily. 90 capsule 3  . telmisartan (MICARDIS) 40 MG tablet Take 0.5 tablets (20 mg total) by mouth daily. 45 tablet 3   Facility-Administered Medications Prior to Visit  Medication Dose Route Frequency Provider Last Rate Last Admin  . cyanocobalamin ((VITAMIN B-12)) injection 1,000 mcg  1,000 mcg Intramuscular Q30 days Dettinger, Fransisca Kaufmann, MD   1,000 mcg at 04/27/20 1012    Allergies  Allergen Reactions  . Simvastatin Other (See Comments)    Leg cramps  . Sulfa Antibiotics     UNSPECIFIED REACTION   . Codeine Nausea And Vomiting  . Hydrocodone Nausea And Vomiting  . Meclizine Nausea Only  . Niacin And Related Other (See Comments)    Flushing in the face and redness   . Ondansetron Other (See Comments)    Tremors   . Phenergan [Promethazine Hcl] Other (See Comments)    Tremors     Review of Systems  Constitutional: Negative for fever.  HENT: Positive for congestion. Negative for sore throat.   Respiratory: Positive for cough, shortness of breath and wheezing.   Cardiovascular: Negative for chest pain.  Gastrointestinal: Negative for nausea.  Neurological: Negative for headaches.  All other systems reviewed and are negative.      Objective:    Physical Exam Vitals reviewed.  HENT:     Head: Normocephalic.     Nose: Congestion present.  Eyes:     Conjunctiva/sclera:  Conjunctivae normal.  Cardiovascular:     Rate and Rhythm: Normal rate and regular rhythm.     Pulses: Normal pulses.     Heart sounds: Normal heart sounds.  Pulmonary:     Effort: Pulmonary effort is normal. No respiratory distress.     Breath sounds: Wheezing present.  Chest:     Chest wall: Tenderness present.  Abdominal:     General: Bowel sounds are normal.  Neurological:     Mental Status: He is alert and oriented to person, place, and time.  Psychiatric:        Behavior: Behavior normal.     BP 138/80   Pulse 72   Temp (!) 97 F (36.1 C)   SpO2 98%  Wt Readings from Last 3 Encounters:  05/27/20 179 lb 14.3 oz (81.6 kg)  04/27/20 180 lb (81.6 kg)  02/20/20 178 lb (80.7 kg)    Health Maintenance Due  Topic Date Due  . COVID-19 Vaccine (3 - Booster for Moderna series) 02/13/2020    There are no preventive care reminders to display for this patient.   Lab Results  Component Value Date   TSH 3.580 04/24/2020   Lab Results  Component Value Date   WBC 6.0 08/26/2019   HGB 15.7 08/26/2019   HCT 45.7 08/26/2019   MCV 93 08/26/2019   PLT 145 (L) 08/26/2019   Lab Results  Component Value Date   NA 143 04/24/2020   K 4.2 04/24/2020   CO2 24 04/24/2020   GLUCOSE 96 04/24/2020   BUN 14 04/24/2020   CREATININE 1.34 (H) 04/24/2020   BILITOT 0.8 04/24/2020   ALKPHOS 78 04/24/2020   AST 20 04/24/2020   ALT 17 04/24/2020   PROT 7.1 04/24/2020   ALBUMIN 4.6 04/24/2020   CALCIUM 9.5 04/24/2020   ANIONGAP 5  10/12/2017   Lab Results  Component Value Date   CHOL 142 08/26/2019   Lab Results  Component Value Date   HDL 47 08/26/2019   Lab Results  Component Value Date   LDLCALC 77 08/26/2019   Lab Results  Component Value Date   TRIG 99 08/26/2019   Lab Results  Component Value Date   CHOLHDL 3.0 08/26/2019   No results found for: HGBA1C     Assessment & Plan:   Problem List Items Addressed This Visit      Other   Cough - Primary     Symptoms of worsening cough and congestion  not well managed in the last 14 days. covid-19 test completed,  Started patient on z-pack, prednisone taper, Muccinex and chest x-ray completed.  Rx sent to pharmacy,  Education provided with printed hand out given       Relevant Medications   azithromycin (ZITHROMAX) 250 MG tablet   predniSONE (STERAPRED UNI-PAK 21 TAB) 10 MG (21) TBPK tablet   Other Relevant Orders   DG Chest 2 View   Novel Coronavirus, NAA ([facility])       Meds ordered this encounter  Medications  . azithromycin (ZITHROMAX) 250 MG tablet    Sig: 2 tablets [500 mg] day 1, 1 tablet [250 mg] day 2-5    Dispense:  1 each    Refill:  0    Order Specific Question:   Supervising Provider    Answer:   Janora Norlander [[id number]]  . predniSONE (STERAPRED UNI-PAK 21 TAB) 10 MG (21) TBPK tablet    Sig: 6 tablets day 1, 5 tablets day 2, 4 tablet day 3, 3 tablets day 4, 5 tablet day 5, 1 tablet day 6.    Dispense:  1 each    Refill:  0    Order Specific Question:   Supervising Provider    Answer:   Janora Norlander [[id number]]     Ivy Lynn, NP

## 2020-07-05 LAB — SARS-COV-2, NAA 2 DAY TAT

## 2020-07-05 LAB — NOVEL CORONAVIRUS, NAA: SARS-CoV-2, NAA: NOT DETECTED

## 2020-08-20 DIAGNOSIS — Z23 Encounter for immunization: Secondary | ICD-10-CM | POA: Diagnosis not present

## 2020-08-24 ENCOUNTER — Other Ambulatory Visit: Payer: Medicare HMO

## 2020-08-24 ENCOUNTER — Other Ambulatory Visit: Payer: Self-pay

## 2020-08-24 DIAGNOSIS — I10 Essential (primary) hypertension: Secondary | ICD-10-CM

## 2020-08-24 DIAGNOSIS — E0590 Thyrotoxicosis, unspecified without thyrotoxic crisis or storm: Secondary | ICD-10-CM | POA: Diagnosis not present

## 2020-08-25 LAB — CMP14+EGFR
ALT: 10 IU/L (ref 0–44)
AST: 16 IU/L (ref 0–40)
Albumin/Globulin Ratio: 1.5 (ref 1.2–2.2)
Albumin: 4 g/dL (ref 3.7–4.7)
Alkaline Phosphatase: 81 IU/L (ref 44–121)
BUN/Creatinine Ratio: 13 (ref 10–24)
BUN: 15 mg/dL (ref 8–27)
Bilirubin Total: 0.4 mg/dL (ref 0.0–1.2)
CO2: 24 mmol/L (ref 20–29)
Calcium: 9.1 mg/dL (ref 8.6–10.2)
Chloride: 107 mmol/L — ABNORMAL HIGH (ref 96–106)
Creatinine, Ser: 1.2 mg/dL (ref 0.76–1.27)
Globulin, Total: 2.7 g/dL (ref 1.5–4.5)
Glucose: 95 mg/dL (ref 65–99)
Potassium: 4.6 mmol/L (ref 3.5–5.2)
Sodium: 146 mmol/L — ABNORMAL HIGH (ref 134–144)
Total Protein: 6.7 g/dL (ref 6.0–8.5)
eGFR: 61 mL/min/{1.73_m2} (ref >59)

## 2020-08-25 LAB — CBC WITH DIFFERENTIAL/PLATELET
Basophils Absolute: 0.1 10*3/uL (ref 0.0–0.2)
Basos: 1 %
EOS (ABSOLUTE): 0.3 10*3/uL (ref 0.0–0.4)
Eos: 5 %
Hematocrit: 45 % (ref 37.5–51.0)
Hemoglobin: 15 g/dL (ref 13.0–17.7)
Immature Grans (Abs): 0 10*3/uL (ref 0.0–0.1)
Immature Granulocytes: 0 %
Lymphocytes Absolute: 1.6 10*3/uL (ref 0.7–3.1)
Lymphs: 32 %
MCH: 30.6 pg (ref 26.6–33.0)
MCHC: 33.3 g/dL (ref 31.5–35.7)
MCV: 92 fL (ref 79–97)
Monocytes Absolute: 0.6 10*3/uL (ref 0.1–0.9)
Monocytes: 12 %
Neutrophils Absolute: 2.5 10*3/uL (ref 1.4–7.0)
Neutrophils: 50 %
Platelets: 113 10*3/uL — ABNORMAL LOW (ref 150–450)
RBC: 4.9 x10E6/uL (ref 4.14–5.80)
RDW: 12.8 % (ref 11.6–15.4)
WBC: 5 10*3/uL (ref 3.4–10.8)

## 2020-08-25 LAB — LIPID PANEL
Chol/HDL Ratio: 3.3 ratio (ref 0.0–5.0)
Cholesterol, Total: 131 mg/dL (ref 100–199)
HDL: 40 mg/dL (ref >39)
LDL Chol Calc (NIH): 75 mg/dL (ref 0–99)
Triglycerides: 79 mg/dL (ref 0–149)
VLDL Cholesterol Cal: 16 mg/dL (ref 5–40)

## 2020-08-25 LAB — TSH: TSH: 4.8 u[IU]/mL — ABNORMAL HIGH (ref 0.450–4.500)

## 2020-08-26 ENCOUNTER — Encounter: Payer: Self-pay | Admitting: Family Medicine

## 2020-08-26 ENCOUNTER — Other Ambulatory Visit: Payer: Self-pay

## 2020-08-26 ENCOUNTER — Ambulatory Visit (INDEPENDENT_AMBULATORY_CARE_PROVIDER_SITE_OTHER): Payer: Medicare HMO | Admitting: Family Medicine

## 2020-08-26 VITALS — BP 136/74 | HR 64 | Ht 67.0 in | Wt 180.0 lb

## 2020-08-26 DIAGNOSIS — K219 Gastro-esophageal reflux disease without esophagitis: Secondary | ICD-10-CM | POA: Diagnosis not present

## 2020-08-26 DIAGNOSIS — R972 Elevated prostate specific antigen [PSA]: Secondary | ICD-10-CM | POA: Diagnosis not present

## 2020-08-26 DIAGNOSIS — E7800 Pure hypercholesterolemia, unspecified: Secondary | ICD-10-CM

## 2020-08-26 DIAGNOSIS — E538 Deficiency of other specified B group vitamins: Secondary | ICD-10-CM | POA: Diagnosis not present

## 2020-08-26 DIAGNOSIS — E0590 Thyrotoxicosis, unspecified without thyrotoxic crisis or storm: Secondary | ICD-10-CM | POA: Diagnosis not present

## 2020-08-26 DIAGNOSIS — I10 Essential (primary) hypertension: Secondary | ICD-10-CM | POA: Diagnosis not present

## 2020-08-26 NOTE — Progress Notes (Signed)
BP 136/74   Pulse 64   Ht '5\' 7"'  (1.702 m)   Wt 180 lb (81.6 kg)   SpO2 97%   BMI 28.19 kg/m    Subjective:   Patient ID: Mario Smith, male    DOB: 13-Jun-1940, 80 y.o.   MRN: [id number]  HPI: Mario Smith is a 80 y.o. male presenting on 08/26/2020 for Medical Management of Chronic Issues and Hypothyroidism   HPI Hyperthyroidism recheck Patient is coming in for thyroid recheck today as well. They deny any issues with hair changes or heat or cold problems or diarrhea or constipation. They deny any chest pain or palpitations. They are currently on methimazole  Hypertension Patient is currently on hydrochlorothiazide and metoprolol, and their blood pressure today is 136/74. Patient denies any lightheadedness or dizziness. Patient denies headaches, blurred vision, chest pains, shortness of breath, or weakness. Denies any side effects from medication and is content with current medication.   Hyperlipidemia Patient is coming in for recheck of his hyperlipidemia. The patient is currently taking fish oil and Crestor. They deny any issues with myalgias or history of liver damage from it. They deny any focal numbness or weakness or chest pain.   GERD Patient is currently on pantoprazole.  She denies any major symptoms or abdominal pain or belching or burping. She denies any blood in her stool or lightheadedness or dizziness.   Relevant past medical, surgical, family and social history reviewed and updated as indicated. Interim medical history since our last visit reviewed. Allergies and medications reviewed and updated.  Review of Systems  Constitutional: Negative for chills and fever.  Eyes: Negative for visual disturbance.  Respiratory: Negative for shortness of breath and wheezing.   Cardiovascular: Negative for chest pain and leg swelling.  Musculoskeletal: Negative for back pain and gait problem.  Skin: Negative for rash.  Neurological: Negative for dizziness, weakness and  light-headedness.  All other systems reviewed and are negative.   Per HPI unless specifically indicated above   Allergies as of 08/26/2020      Reactions   Simvastatin Other (See Comments)   Leg cramps   Sulfa Antibiotics    UNSPECIFIED REACTION    Codeine Nausea And Vomiting   Hydrocodone Nausea And Vomiting   Meclizine Nausea Only   Niacin And Related Other (See Comments)   Flushing in the face and redness    Ondansetron Other (See Comments)   Tremors   Phenergan [promethazine Hcl] Other (See Comments)   Tremors      Medication List       Accurate as of August 26, 2020 10:54 AM. If you have any questions, ask your nurse or doctor.        STOP taking these medications   azithromycin 250 MG tablet Commonly known as: Zithromax Stopped by: Fransisca Kaufmann Dettinger, MD   predniSONE 10 MG (21) Tbpk tablet Commonly known as: STERAPRED UNI-PAK 21 TAB Stopped by: Fransisca Kaufmann Dettinger, MD     TAKE these medications   albuterol 108 (90 Base) MCG/ACT inhaler Commonly known as: VENTOLIN HFA Inhale 2 puffs into the lungs every 6 (six) hours as needed for wheezing or shortness of breath.   carboxymethylcellulose 0.5 % Soln Commonly known as: REFRESH PLUS Place 1 drop into both eyes as needed (dry eyes).   Co Q 10 100 MG Caps Take 100 mg by mouth daily.   Fish Oil 1000 MG Caps Take 1,000 mg by mouth daily.   fluticasone 50 MCG/ACT nasal  spray Commonly known as: FLONASE SPRAY 2 SPRAYS INTO EACH NOSTRIL EVERY DAY   guaiFENesin 600 MG 12 hr tablet Commonly known as: Mucinex Take 1-2 tablets every 12 hours as needed for congestion.   hydrochlorothiazide 12.5 MG capsule Commonly known as: MICROZIDE Take 2 capsules (25 mg total) by mouth daily.   methimazole 5 MG tablet Commonly known as: TAPAZOLE Take 1 tablet (5 mg total) by mouth every Monday, Wednesday, and Friday.   metoprolol tartrate 25 MG tablet Commonly known as: LOPRESSOR Take 1 tablet (25 mg total) by mouth 2  (two) times daily.   pantoprazole 40 MG tablet Commonly known as: PROTONIX Take 1 tablet (40 mg total) by mouth 2 (two) times daily.   rosuvastatin 10 MG tablet Commonly known as: CRESTOR Take 1 tablet (10 mg total) by mouth daily.   tamsulosin 0.4 MG Caps capsule Commonly known as: FLOMAX Take 1 capsule (0.4 mg total) by mouth daily.   telmisartan 40 MG tablet Commonly known as: MICARDIS Take 0.5 tablets (20 mg total) by mouth daily.   Vitamin D3 50 MCG (2000 UT) Tabs Take 2,000 Units by mouth daily.        Objective:   BP 136/74   Pulse 64   Ht '5\' 7"'  (1.702 m)   Wt 180 lb (81.6 kg)   SpO2 97%   BMI 28.19 kg/m   Wt Readings from Last 3 Encounters:  08/26/20 180 lb (81.6 kg)  05/27/20 179 lb 14.3 oz (81.6 kg)  04/27/20 180 lb (81.6 kg)    Physical Exam Vitals and nursing note reviewed.  Constitutional:      General: He is not in acute distress.    Appearance: He is well-developed. He is not diaphoretic.  Eyes:     General: No scleral icterus.    Conjunctiva/sclera: Conjunctivae normal.  Neck:     Thyroid: No thyromegaly.  Cardiovascular:     Rate and Rhythm: Normal rate and regular rhythm.     Heart sounds: Normal heart sounds. No murmur heard.   Pulmonary:     Effort: Pulmonary effort is normal. No respiratory distress.     Breath sounds: Normal breath sounds. No wheezing.  Musculoskeletal:        General: Normal range of motion.     Cervical back: Neck supple.  Lymphadenopathy:     Cervical: No cervical adenopathy.  Skin:    General: Skin is warm and dry.     Findings: No rash.  Neurological:     Mental Status: He is alert and oriented to person, place, and time.     Coordination: Coordination normal.  Psychiatric:        Behavior: Behavior normal.     Results for orders placed or performed in visit on 08/24/20  CBC with Differential/Platelet  Result Value Ref Range   WBC 5.0 3.4 - 10.8 x10E3/uL   RBC 4.90 4.14 - 5.80 x10E6/uL    Hemoglobin 15.0 13.0 - 17.7 g/dL   Hematocrit 45.0 37.5 - 51.0 %   MCV 92 79 - 97 fL   MCH 30.6 26.6 - 33.0 pg   MCHC 33.3 31.5 - 35.7 g/dL   RDW 12.8 11.6 - 15.4 %   Platelets 113 (L) 150 - 450 x10E3/uL   Neutrophils 50 Not Estab. %   Lymphs 32 Not Estab. %   Monocytes 12 Not Estab. %   Eos 5 Not Estab. %   Basos 1 Not Estab. %   Neutrophils Absolute 2.5 1.4 -  7.0 x10E3/uL   Lymphocytes Absolute 1.6 0.7 - 3.1 x10E3/uL   Monocytes Absolute 0.6 0.1 - 0.9 x10E3/uL   EOS (ABSOLUTE) 0.3 0.0 - 0.4 x10E3/uL   Basophils Absolute 0.1 0.0 - 0.2 x10E3/uL   Immature Granulocytes 0 Not Estab. %   Immature Grans (Abs) 0.0 0.0 - 0.1 x10E3/uL  CMP14+EGFR  Result Value Ref Range   Glucose 95 65 - 99 mg/dL   BUN 15 8 - 27 mg/dL   Creatinine, Ser 1.20 0.76 - 1.27 mg/dL   eGFR 61 >59 mL/min/1.73   BUN/Creatinine Ratio 13 10 - 24   Sodium 146 (H) 134 - 144 mmol/L   Potassium 4.6 3.5 - 5.2 mmol/L   Chloride 107 (H) 96 - 106 mmol/L   CO2 24 20 - 29 mmol/L   Calcium 9.1 8.6 - 10.2 mg/dL   Total Protein 6.7 6.0 - 8.5 g/dL   Albumin 4.0 3.7 - 4.7 g/dL   Globulin, Total 2.7 1.5 - 4.5 g/dL   Albumin/Globulin Ratio 1.5 1.2 - 2.2   Bilirubin Total 0.4 0.0 - 1.2 mg/dL   Alkaline Phosphatase 81 44 - 121 IU/L   AST 16 0 - 40 IU/L   ALT 10 0 - 44 IU/L  Lipid panel  Result Value Ref Range   Cholesterol, Total 131 100 - 199 mg/dL   Triglycerides 79 0 - 149 mg/dL   HDL 40 >39 mg/dL   VLDL Cholesterol Cal 16 5 - 40 mg/dL   LDL Chol Calc (NIH) 75 0 - 99 mg/dL   Chol/HDL Ratio 3.3 0.0 - 5.0 ratio  TSH  Result Value Ref Range   TSH 4.800 (H) 0.450 - 4.500 uIU/mL    Assessment & Plan:   Problem List Items Addressed This Visit      Cardiovascular and Mediastinum   Hypertension   Relevant Orders   CMP14+EGFR     Digestive   Gastroesophageal reflux disease     Endocrine   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 - Primary   Relevant Orders   TSH     Other   Hyperlipidemia   Elevated PSA   Relevant  Orders   PSA, total and free      TSH is borderline, will continue current dose of methimazole but if still elevated next time we may have to lower it.  We will check PSA and thyroid again in 4 months.  Blood pressure looks good. Follow up plan: Return in about 4 months (around 12/26/2020), or if symptoms worsen or fail to improve, for Hypothyroidism and cholesterol recheck..  Counseling provided for all of the vaccine components No orders of the defined types were placed in this encounter.   Caryl Pina, MD [facility] 08/26/2020, 10:54 AM

## 2020-08-31 ENCOUNTER — Encounter: Payer: Self-pay | Admitting: Family Medicine

## 2020-08-31 ENCOUNTER — Ambulatory Visit (INDEPENDENT_AMBULATORY_CARE_PROVIDER_SITE_OTHER): Payer: Medicare HMO | Admitting: Family Medicine

## 2020-08-31 DIAGNOSIS — J019 Acute sinusitis, unspecified: Secondary | ICD-10-CM | POA: Diagnosis not present

## 2020-08-31 MED ORDER — AMOXICILLIN 875 MG PO TABS: 875.0000 mg | ORAL_TABLET | Freq: Two times a day (BID) | ORAL | 0 refills | Status: AC

## 2020-08-31 NOTE — Progress Notes (Signed)
.    Virtual Visit  Note Due to COVID-19 pandemic this visit was conducted virtually. This visit type was conducted due to national recommendations for restrictions regarding the COVID-19 Pandemic (e.g. social distancing, sheltering in place) in an effort to limit this patient's exposure and mitigate transmission in our community. All issues noted in this document were discussed and addressed.  A physical exam was not performed with this format.  I connected with Mario Smith on 08/31/20 at 1014 by telephone and verified that I am speaking with the correct person using two identifiers. Mario Smith is currently located at home and his wife is currently with him during the visit. The provider, Gwenlyn Perking, FNP is located in their office at time of visit.  I discussed the limitations, risks, security and privacy concerns of performing an evaluation and management service by telephone and the availability of in person appointments. I also discussed with the patient that there may be a patient responsible charge related to this service. The patient expressed understanding and agreed to proceed.  CC: sinusitis  History and Present Illness:  HPI  Mario Smith reports head congestion for about 10 days. He has also had postnasal drip, cough, sore throat, and sneezing x 4 days. He denies fever, body aches, chills, nausea, vomiting, or shortness of breath. He has been taking flonase and mucinex with little improvement. He has been staying well hydrated.    ROS As per HPI.   Observations/Objective: Alert and oriented x 3. Able to speak in full sentences without difficulty.   Assessment and Plan: Mario Smith was seen today for sinus problem.  Diagnoses and all orders for this visit:  Acute non-recurrent sinusitis, unspecified location Will treat with abx given length of symptoms without improvement with OTC measures.  -     amoxicillin (AMOXIL) 875 MG tablet; Take 1 tablet (875 mg total) by mouth 2 (two) times  daily for 7 days.   Follow Up Instructions: Return to office for new or worsening symptoms, or if symptoms persist.     I discussed the assessment and treatment plan with the patient. The patient was provided an opportunity to ask questions and all were answered. The patient agreed with the plan and demonstrated an understanding of the instructions.   The patient was advised to call back or seek an in-person evaluation if the symptoms worsen or if the condition fails to improve as anticipated.  The above assessment and management plan was discussed with the patient. The patient verbalized understanding of and has agreed to the management plan. Patient is aware to call the clinic if symptoms persist or worsen. Patient is aware when to return to the clinic for a follow-up visit. Patient educated on when it is appropriate to go to the emergency department.   Time call ended:  1025  I provided 11 minutes of  non face-to-face time during this encounter.    Gwenlyn Perking, FNP

## 2020-09-06 DIAGNOSIS — R059 Cough, unspecified: Secondary | ICD-10-CM | POA: Diagnosis not present

## 2020-09-06 DIAGNOSIS — R062 Wheezing: Secondary | ICD-10-CM | POA: Diagnosis not present

## 2020-09-06 DIAGNOSIS — J029 Acute pharyngitis, unspecified: Secondary | ICD-10-CM | POA: Diagnosis not present

## 2020-09-06 DIAGNOSIS — R0981 Nasal congestion: Secondary | ICD-10-CM | POA: Diagnosis not present

## 2020-09-06 DIAGNOSIS — J329 Chronic sinusitis, unspecified: Secondary | ICD-10-CM | POA: Diagnosis not present

## 2020-09-28 ENCOUNTER — Ambulatory Visit (INDEPENDENT_AMBULATORY_CARE_PROVIDER_SITE_OTHER): Payer: Medicare HMO

## 2020-09-28 ENCOUNTER — Other Ambulatory Visit: Payer: Self-pay

## 2020-09-28 DIAGNOSIS — E538 Deficiency of other specified B group vitamins: Secondary | ICD-10-CM | POA: Diagnosis not present

## 2020-09-28 NOTE — Progress Notes (Signed)
Cyanocobalamin injection given to left deltoid.  Patient tolerated well. 

## 2020-10-26 ENCOUNTER — Other Ambulatory Visit: Payer: Self-pay | Admitting: Family Medicine

## 2020-10-26 DIAGNOSIS — J301 Allergic rhinitis due to pollen: Secondary | ICD-10-CM

## 2020-10-28 ENCOUNTER — Other Ambulatory Visit: Payer: Self-pay

## 2020-10-28 ENCOUNTER — Ambulatory Visit (INDEPENDENT_AMBULATORY_CARE_PROVIDER_SITE_OTHER): Payer: Medicare HMO

## 2020-10-28 DIAGNOSIS — E538 Deficiency of other specified B group vitamins: Secondary | ICD-10-CM

## 2020-10-28 NOTE — Progress Notes (Signed)
Pt tolerated b12 well. 

## 2020-11-30 ENCOUNTER — Other Ambulatory Visit: Payer: Self-pay

## 2020-11-30 ENCOUNTER — Ambulatory Visit (INDEPENDENT_AMBULATORY_CARE_PROVIDER_SITE_OTHER): Payer: Medicare HMO

## 2020-11-30 DIAGNOSIS — E538 Deficiency of other specified B group vitamins: Secondary | ICD-10-CM

## 2020-11-30 NOTE — Progress Notes (Signed)
Cyanocobalamin injection given to left deltoid.  Patient tolerated well. 

## 2020-12-21 ENCOUNTER — Other Ambulatory Visit: Payer: Medicare HMO

## 2020-12-21 ENCOUNTER — Other Ambulatory Visit: Payer: Self-pay

## 2020-12-21 DIAGNOSIS — I10 Essential (primary) hypertension: Secondary | ICD-10-CM | POA: Diagnosis not present

## 2020-12-21 DIAGNOSIS — E0590 Thyrotoxicosis, unspecified without thyrotoxic crisis or storm: Secondary | ICD-10-CM | POA: Diagnosis not present

## 2020-12-21 DIAGNOSIS — R972 Elevated prostate specific antigen [PSA]: Secondary | ICD-10-CM

## 2020-12-22 LAB — CMP14+EGFR
ALT: 12 IU/L (ref 0–44)
AST: 19 IU/L (ref 0–40)
Albumin/Globulin Ratio: 1.7 (ref 1.2–2.2)
Albumin: 4 g/dL (ref 3.7–4.7)
Alkaline Phosphatase: 64 IU/L (ref 44–121)
BUN/Creatinine Ratio: 11 (ref 10–24)
BUN: 13 mg/dL (ref 8–27)
Bilirubin Total: 0.8 mg/dL (ref 0.0–1.2)
CO2: 24 mmol/L (ref 20–29)
Calcium: 9.1 mg/dL (ref 8.6–10.2)
Chloride: 106 mmol/L (ref 96–106)
Creatinine, Ser: 1.2 mg/dL (ref 0.76–1.27)
Globulin, Total: 2.3 g/dL (ref 1.5–4.5)
Glucose: 94 mg/dL (ref 65–99)
Potassium: 4.1 mmol/L (ref 3.5–5.2)
Sodium: 141 mmol/L (ref 134–144)
Total Protein: 6.3 g/dL (ref 6.0–8.5)
eGFR: 61 mL/min/{1.73_m2} (ref >59)

## 2020-12-22 LAB — TSH: TSH: 5.6 u[IU]/mL — ABNORMAL HIGH (ref 0.450–4.500)

## 2020-12-22 LAB — PSA, TOTAL AND FREE
PSA, Free Pct: 14.6 %
PSA, Free: 0.51 ng/mL
Prostate Specific Ag, Serum: 3.5 ng/mL (ref 0.0–4.0)

## 2020-12-24 ENCOUNTER — Other Ambulatory Visit: Payer: Self-pay

## 2020-12-24 ENCOUNTER — Encounter: Payer: Self-pay | Admitting: Family Medicine

## 2020-12-24 ENCOUNTER — Ambulatory Visit (INDEPENDENT_AMBULATORY_CARE_PROVIDER_SITE_OTHER): Payer: Medicare HMO | Admitting: Family Medicine

## 2020-12-24 VITALS — BP 108/70 | HR 70 | Ht 67.0 in | Wt 175.0 lb

## 2020-12-24 DIAGNOSIS — K219 Gastro-esophageal reflux disease without esophagitis: Secondary | ICD-10-CM

## 2020-12-24 DIAGNOSIS — E0590 Thyrotoxicosis, unspecified without thyrotoxic crisis or storm: Secondary | ICD-10-CM | POA: Diagnosis not present

## 2020-12-24 DIAGNOSIS — E7800 Pure hypercholesterolemia, unspecified: Secondary | ICD-10-CM

## 2020-12-24 DIAGNOSIS — I10 Essential (primary) hypertension: Secondary | ICD-10-CM

## 2020-12-24 MED ORDER — METHIMAZOLE 5 MG PO TABS: 5.0000 mg | ORAL_TABLET | ORAL | 3 refills | Status: DC

## 2020-12-24 NOTE — Progress Notes (Signed)
BP 108/70   Pulse 70   Ht '5\' 7"'  (1.702 m)   Wt 175 lb (79.4 kg)   SpO2 94%   BMI 27.41 kg/m    Subjective:   Patient ID: Mario Smith, male    DOB: [birth year], 80 y.o.   MRN: [id number]  HPI: BEATRIZ SETTLES is a 80 y.o. male presenting on 12/24/2020 for Medical Management of Chronic Issues, Hypothyroidism, and Hypertension   HPI Hypertension Patient is currently on metoprolol and Micardis although is only taking the Micardis as needed, and their blood pressure today is 108/70. Patient denies any lightheadedness or dizziness. Patient denies headaches, blurred vision, chest pains, shortness of breath, or weakness. Denies any side effects from medication and is content with current medication.   Hyperlipidemia Patient is coming in for recheck of his hyperlipidemia. The patient is currently taking Crestor and fish oil. They deny any issues with myalgias or history of liver damage from it. They deny any focal numbness or weakness or chest pain.   Hyperthyroidism recheck Patient is coming in for thyroid recheck today as well. They deny any issues with hair changes or heat or cold problems or diarrhea or constipation. They deny any chest pain or palpitations. They are currently on methimazole  GERD Patient is currently on pantoprazole.  She denies any major symptoms or abdominal pain or belching or burping. She denies any blood in her stool or lightheadedness or dizziness.   Patient has itchy scalp, no rash noted, recommended as needed hydrocortisone and a moisturizing conditioner  Relevant past medical, surgical, family and social history reviewed and updated as indicated. Interim medical history since our last visit reviewed. Allergies and medications reviewed and updated.  Review of Systems  Constitutional:  Negative for chills and fever.  Eyes:  Negative for visual disturbance.  Respiratory:  Negative for shortness of breath and wheezing.   Cardiovascular:  Negative for chest pain  and leg swelling.  Musculoskeletal:  Negative for back pain and gait problem.  Skin:  Negative for rash.  Neurological:  Negative for dizziness, weakness and light-headedness.  All other systems reviewed and are negative.  Per HPI unless specifically indicated above   Allergies as of 12/24/2020       Reactions   Simvastatin Other (See Comments)   Leg cramps   Sulfa Antibiotics    UNSPECIFIED REACTION    Codeine Nausea And Vomiting   Hydrocodone Nausea And Vomiting   Meclizine Nausea Only   Niacin And Related Other (See Comments)   Flushing in the face and redness    Ondansetron Other (See Comments)   Tremors   Phenergan [promethazine Hcl] Other (See Comments)   Tremors        Medication List        Accurate as of December 24, 2020 11:09 AM. If you have any questions, ask your nurse or doctor.          albuterol 108 (90 Base) MCG/ACT inhaler Commonly known as: VENTOLIN HFA Inhale 2 puffs into the lungs every 6 (six) hours as needed for wheezing or shortness of breath.   carboxymethylcellulose 0.5 % Soln Commonly known as: REFRESH PLUS Place 1 drop into both eyes as needed (dry eyes).   Co Q 10 100 MG Caps Take 100 mg by mouth daily.   Fish Oil 1000 MG Caps Take 1,000 mg by mouth daily.   fluticasone 50 MCG/ACT nasal spray Commonly known as: FLONASE SPRAY 2 SPRAYS INTO EACH NOSTRIL EVERY DAY  guaiFENesin 600 MG 12 hr tablet Commonly known as: Mucinex Take 1-2 tablets every 12 hours as needed for congestion.   hydrochlorothiazide 12.5 MG capsule Commonly known as: MICROZIDE Take 2 capsules (25 mg total) by mouth daily.   methimazole 5 MG tablet Commonly known as: TAPAZOLE Take 1 tablet (5 mg total) by mouth 2 (two) times a week. What changed: when to take this Changed by: Worthy Rancher, MD   metoprolol tartrate 25 MG tablet Commonly known as: LOPRESSOR Take 1 tablet (25 mg total) by mouth 2 (two) times daily.   pantoprazole 40 MG  tablet Commonly known as: PROTONIX Take 1 tablet (40 mg total) by mouth 2 (two) times daily.   rosuvastatin 10 MG tablet Commonly known as: CRESTOR Take 1 tablet (10 mg total) by mouth daily.   tamsulosin 0.4 MG Caps capsule Commonly known as: FLOMAX Take 1 capsule (0.4 mg total) by mouth daily.   telmisartan 40 MG tablet Commonly known as: MICARDIS Take 0.5 tablets (20 mg total) by mouth daily.   Vitamin D3 50 MCG (2000 UT) Tabs Take 2,000 Units by mouth daily.         Objective:   BP 108/70   Pulse 70   Ht '5\' 7"'  (1.702 m)   Wt 175 lb (79.4 kg)   SpO2 94%   BMI 27.41 kg/m   Wt Readings from Last 3 Encounters:  12/24/20 175 lb (79.4 kg)  08/26/20 180 lb (81.6 kg)  05/27/20 179 lb 14.3 oz (81.6 kg)    Physical Exam Vitals and nursing note reviewed.  Constitutional:      General: He is not in acute distress.    Appearance: He is well-developed. He is not diaphoretic.  Eyes:     General: No scleral icterus.    Conjunctiva/sclera: Conjunctivae normal.  Neck:     Thyroid: No thyromegaly.  Cardiovascular:     Rate and Rhythm: Normal rate and regular rhythm.     Heart sounds: Normal heart sounds. No murmur heard. Pulmonary:     Effort: Pulmonary effort is normal. No respiratory distress.     Breath sounds: Normal breath sounds. No wheezing.  Musculoskeletal:        General: Normal range of motion.     Cervical back: Neck supple.  Lymphadenopathy:     Cervical: No cervical adenopathy.  Skin:    General: Skin is warm and dry.     Findings: No rash.  Neurological:     Mental Status: He is alert and oriented to person, place, and time.     Coordination: Coordination normal.  Psychiatric:        Behavior: Behavior normal.    Results for orders placed or performed in visit on 12/21/20  TSH  Result Value Ref Range   TSH 5.600 (H) 0.450 - 4.500 uIU/mL  CMP14+EGFR  Result Value Ref Range   Glucose 94 65 - 99 mg/dL   BUN 13 8 - 27 mg/dL   Creatinine, Ser  1.20 0.76 - 1.27 mg/dL   eGFR 61 >59 mL/min/1.73   BUN/Creatinine Ratio 11 10 - 24   Sodium 141 134 - 144 mmol/L   Potassium 4.1 3.5 - 5.2 mmol/L   Chloride 106 96 - 106 mmol/L   CO2 24 20 - 29 mmol/L   Calcium 9.1 8.6 - 10.2 mg/dL   Total Protein 6.3 6.0 - 8.5 g/dL   Albumin 4.0 3.7 - 4.7 g/dL   Globulin, Total 2.3 1.5 - 4.5 g/dL  Albumin/Globulin Ratio 1.7 1.2 - 2.2   Bilirubin Total 0.8 0.0 - 1.2 mg/dL   Alkaline Phosphatase 64 44 - 121 IU/L   AST 19 0 - 40 IU/L   ALT 12 0 - 44 IU/L  PSA, total and free  Result Value Ref Range   Prostate Specific Ag, Serum 3.5 0.0 - 4.0 ng/mL   PSA, Free 0.51 N/A ng/mL   PSA, Free Pct 14.6 %    Assessment & Plan:   Problem List Items Addressed This Visit       Cardiovascular and Mediastinum   Hypertension - Primary     Digestive   Gastroesophageal reflux disease     Endocrine   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   Relevant Medications   methimazole (TAPAZOLE) 5 MG tablet     Other   Hyperlipidemia    Will reduce methimazole to only twice a week and continue to monitor his thyroid.  He was on the lower side for the last 2 times.   Follow up plan: Return in about 3 months (around 03/26/2021), or if symptoms worsen or fail to improve, for Hyperthyroidism and hypertension.  Counseling provided for all of the vaccine components No orders of the defined types were placed in this encounter.   Caryl Pina, MD [facility] 12/24/2020, 11:09 AM

## 2020-12-25 LAB — THYROID PANEL
Free Thyroxine Index: 2.2 (ref 1.2–4.9)
T3 Uptake Ratio: 30 % (ref 24–39)
T4, Total: 7.4 ug/dL (ref 4.5–12.0)

## 2020-12-25 LAB — SPECIMEN STATUS REPORT

## 2020-12-30 ENCOUNTER — Ambulatory Visit: Payer: Medicare HMO

## 2021-01-07 ENCOUNTER — Other Ambulatory Visit: Payer: Self-pay

## 2021-01-07 ENCOUNTER — Ambulatory Visit (INDEPENDENT_AMBULATORY_CARE_PROVIDER_SITE_OTHER): Payer: Medicare HMO | Admitting: *Deleted

## 2021-01-07 DIAGNOSIS — L819 Disorder of pigmentation, unspecified: Secondary | ICD-10-CM | POA: Diagnosis not present

## 2021-01-07 DIAGNOSIS — L80 Vitiligo: Secondary | ICD-10-CM | POA: Diagnosis not present

## 2021-01-07 DIAGNOSIS — L570 Actinic keratosis: Secondary | ICD-10-CM | POA: Diagnosis not present

## 2021-01-07 DIAGNOSIS — E538 Deficiency of other specified B group vitamins: Secondary | ICD-10-CM

## 2021-01-07 DIAGNOSIS — D1801 Hemangioma of skin and subcutaneous tissue: Secondary | ICD-10-CM | POA: Diagnosis not present

## 2021-01-07 DIAGNOSIS — L814 Other melanin hyperpigmentation: Secondary | ICD-10-CM | POA: Diagnosis not present

## 2021-01-07 DIAGNOSIS — R202 Paresthesia of skin: Secondary | ICD-10-CM | POA: Diagnosis not present

## 2021-01-07 DIAGNOSIS — D229 Melanocytic nevi, unspecified: Secondary | ICD-10-CM | POA: Diagnosis not present

## 2021-01-07 DIAGNOSIS — L821 Other seborrheic keratosis: Secondary | ICD-10-CM | POA: Diagnosis not present

## 2021-02-09 ENCOUNTER — Other Ambulatory Visit: Payer: Self-pay

## 2021-02-09 ENCOUNTER — Ambulatory Visit (INDEPENDENT_AMBULATORY_CARE_PROVIDER_SITE_OTHER): Payer: Medicare HMO | Admitting: *Deleted

## 2021-02-09 DIAGNOSIS — E538 Deficiency of other specified B group vitamins: Secondary | ICD-10-CM

## 2021-02-09 NOTE — Progress Notes (Signed)
Pt given B12 injection IM left deltoid and tolerated well. °

## 2021-03-11 ENCOUNTER — Other Ambulatory Visit: Payer: Self-pay | Admitting: Family Medicine

## 2021-03-11 ENCOUNTER — Telehealth: Payer: Self-pay | Admitting: Family Medicine

## 2021-03-11 DIAGNOSIS — K219 Gastro-esophageal reflux disease without esophagitis: Secondary | ICD-10-CM

## 2021-03-11 DIAGNOSIS — I10 Essential (primary) hypertension: Secondary | ICD-10-CM

## 2021-03-11 DIAGNOSIS — E0590 Thyrotoxicosis, unspecified without thyrotoxic crisis or storm: Secondary | ICD-10-CM

## 2021-03-11 DIAGNOSIS — E7800 Pure hypercholesterolemia, unspecified: Secondary | ICD-10-CM

## 2021-03-11 DIAGNOSIS — E559 Vitamin D deficiency, unspecified: Secondary | ICD-10-CM

## 2021-03-11 NOTE — Progress Notes (Unsigned)
Placed lab orders for patient to come in

## 2021-03-12 ENCOUNTER — Ambulatory Visit (INDEPENDENT_AMBULATORY_CARE_PROVIDER_SITE_OTHER): Payer: Medicare HMO

## 2021-03-12 ENCOUNTER — Other Ambulatory Visit: Payer: Self-pay

## 2021-03-12 DIAGNOSIS — E538 Deficiency of other specified B group vitamins: Secondary | ICD-10-CM | POA: Diagnosis not present

## 2021-03-12 NOTE — Progress Notes (Signed)
Cyanocobalamin injection given to left arm.  Patient tolerated well.

## 2021-03-22 ENCOUNTER — Other Ambulatory Visit: Payer: Self-pay | Admitting: Family Medicine

## 2021-03-22 DIAGNOSIS — E7800 Pure hypercholesterolemia, unspecified: Secondary | ICD-10-CM

## 2021-04-01 ENCOUNTER — Other Ambulatory Visit: Payer: Self-pay

## 2021-04-01 ENCOUNTER — Ambulatory Visit (INDEPENDENT_AMBULATORY_CARE_PROVIDER_SITE_OTHER): Payer: Medicare HMO

## 2021-04-01 DIAGNOSIS — Z23 Encounter for immunization: Secondary | ICD-10-CM | POA: Diagnosis not present

## 2021-04-07 ENCOUNTER — Ambulatory Visit (INDEPENDENT_AMBULATORY_CARE_PROVIDER_SITE_OTHER): Payer: Medicare HMO | Admitting: Family Medicine

## 2021-04-07 ENCOUNTER — Encounter: Payer: Self-pay | Admitting: Family Medicine

## 2021-04-07 ENCOUNTER — Other Ambulatory Visit: Payer: Self-pay

## 2021-04-07 VITALS — BP 143/74 | HR 91 | Ht 67.0 in | Wt 175.0 lb

## 2021-04-07 DIAGNOSIS — M79604 Pain in right leg: Secondary | ICD-10-CM | POA: Diagnosis not present

## 2021-04-07 NOTE — Progress Notes (Signed)
BP (!) 143/74   Pulse 91   Ht 5\' 7"  (1.702 m)   Wt 175 lb (79.4 kg)   SpO2 94%   BMI 27.41 kg/m    Subjective:   Patient ID: Mario Smith, male    DOB: Jan 02, 1941, 80 y.o.   MRN: [id number]  HPI: Mario Smith is a 80 y.o. male presenting on 04/07/2021 for Leg Pain (RLE)   HPI Patient is coming in complaining of increased pain in his right lower extremity, he was told about varicose veins in the past but he is having more pain on both sides of his calf and behind his knee now that been bothering him.  He denies any swelling or redness or warmth in the leg but just having more pain.  Relevant past medical, surgical, family and social history reviewed and updated as indicated. Interim medical history since our last visit reviewed. Allergies and medications reviewed and updated.  Review of Systems  Musculoskeletal:  Positive for myalgias.  Skin:  Negative for color change, rash and wound.  All other systems reviewed and are negative.  Per HPI unless specifically indicated above   Allergies as of 04/07/2021       Reactions   Simvastatin Other (See Comments)   Leg cramps   Sulfa Antibiotics    UNSPECIFIED REACTION    Codeine Nausea And Vomiting   Hydrocodone Nausea And Vomiting   Meclizine Nausea Only   Niacin And Related Other (See Comments)   Flushing in the face and redness    Ondansetron Other (See Comments)   Tremors   Phenergan [promethazine Hcl] Other (See Comments)   Tremors        Medication List        Accurate as of April 07, 2021  4:05 PM. If you have any questions, ask your nurse or doctor.          albuterol 108 (90 Base) MCG/ACT inhaler Commonly known as: VENTOLIN HFA Inhale 2 puffs into the lungs every 6 (six) hours as needed for wheezing or shortness of breath.   carboxymethylcellulose 0.5 % Soln Commonly known as: REFRESH PLUS Place 1 drop into both eyes as needed (dry eyes).   Co Q 10 100 MG Caps Take 100 mg by mouth daily.    Fish Oil 1000 MG Caps Take 1,000 mg by mouth daily.   fluticasone 50 MCG/ACT nasal spray Commonly known as: FLONASE SPRAY 2 SPRAYS INTO EACH NOSTRIL EVERY DAY   guaiFENesin 600 MG 12 hr tablet Commonly known as: Mucinex Take 1-2 tablets every 12 hours as needed for congestion.   hydrochlorothiazide 12.5 MG capsule Commonly known as: MICROZIDE Take 2 capsules (25 mg total) by mouth daily.   methimazole 5 MG tablet Commonly known as: TAPAZOLE Take 1 tablet (5 mg total) by mouth 2 (two) times a week.   metoprolol tartrate 25 MG tablet Commonly known as: LOPRESSOR Take 1 tablet (25 mg total) by mouth 2 (two) times daily.   pantoprazole 40 MG tablet Commonly known as: PROTONIX Take 1 tablet (40 mg total) by mouth 2 (two) times daily.   rosuvastatin 10 MG tablet Commonly known as: CRESTOR TAKE 1 TABLET BY MOUTH EVERY DAY   tamsulosin 0.4 MG Caps capsule Commonly known as: FLOMAX Take 1 capsule (0.4 mg total) by mouth daily.   telmisartan 40 MG tablet Commonly known as: MICARDIS Take 0.5 tablets (20 mg total) by mouth daily.   Vitamin D3 50 MCG (2000 UT) Tabs Take  2,000 Units by mouth daily.         Objective:   BP (!) 143/74   Pulse 91   Ht 5\' 7"  (1.702 m)   Wt 175 lb (79.4 kg)   SpO2 94%   BMI 27.41 kg/m   Wt Readings from Last 3 Encounters:  04/07/21 175 lb (79.4 kg)  12/24/20 175 lb (79.4 kg)  08/26/20 180 lb (81.6 kg)    Physical Exam Vitals and nursing note reviewed.  Constitutional:      Appearance: Normal appearance.  Musculoskeletal:     Right lower leg: Tenderness (pain in calf and right lower) present. No swelling. No edema.  Neurological:     Mental Status: He is alert.      Assessment & Plan:   Problem List Items Addressed This Visit   None Visit Diagnoses     Acute pain of right lower extremity    -  Primary   Relevant Orders   US Venous Img Lower Unilateral Right       We will do DVT ultrasound for tomorrow but likely due  to varicose veins causing the pain, if DVT ultrasound is negative then we may consider sending him to vascular. Follow up plan: Return if symptoms worsen or fail to improve.  Counseling provided for all of the vaccine components Orders Placed This Encounter  Procedures   US Venous Img Lower Unilateral Right    Caryl Pina, MD [facility] 04/07/2021, 4:05 PM

## 2021-04-08 ENCOUNTER — Ambulatory Visit (HOSPITAL_COMMUNITY)
Admission: RE | Admit: 2021-04-08 | Discharge: 2021-04-08 | Disposition: A | Discharge status: Home / Self Care | Payer: Medicare HMO | Source: Ambulatory Visit | Attending: Family Medicine | Admitting: Family Medicine

## 2021-04-08 DIAGNOSIS — M79604 Pain in right leg: Secondary | ICD-10-CM | POA: Insufficient documentation

## 2021-04-08 DIAGNOSIS — M79661 Pain in right lower leg: Secondary | ICD-10-CM | POA: Diagnosis not present

## 2021-04-09 ENCOUNTER — Other Ambulatory Visit: Payer: Self-pay | Admitting: Family Medicine

## 2021-04-09 DIAGNOSIS — I8311 Varicose veins of right lower extremity with inflammation: Secondary | ICD-10-CM

## 2021-04-09 NOTE — Progress Notes (Signed)
Patient aware and verbalized understanding. °

## 2021-04-09 NOTE — Progress Notes (Unsigned)
Placed referral for vascular doctor.

## 2021-04-12 ENCOUNTER — Ambulatory Visit (INDEPENDENT_AMBULATORY_CARE_PROVIDER_SITE_OTHER): Payer: Medicare HMO

## 2021-04-12 ENCOUNTER — Other Ambulatory Visit: Payer: Self-pay

## 2021-04-12 DIAGNOSIS — E538 Deficiency of other specified B group vitamins: Secondary | ICD-10-CM | POA: Diagnosis not present

## 2021-04-12 NOTE — Progress Notes (Signed)
Cyanocobalamin injection given to left deltoid.  Patient tolerated well. 

## 2021-04-21 ENCOUNTER — Other Ambulatory Visit: Payer: Self-pay

## 2021-04-21 DIAGNOSIS — I8390 Asymptomatic varicose veins of unspecified lower extremity: Secondary | ICD-10-CM

## 2021-04-23 ENCOUNTER — Other Ambulatory Visit: Payer: Medicare HMO

## 2021-04-23 ENCOUNTER — Other Ambulatory Visit: Payer: Self-pay

## 2021-04-23 DIAGNOSIS — I10 Essential (primary) hypertension: Secondary | ICD-10-CM | POA: Diagnosis not present

## 2021-04-23 DIAGNOSIS — E0590 Thyrotoxicosis, unspecified without thyrotoxic crisis or storm: Secondary | ICD-10-CM

## 2021-04-23 DIAGNOSIS — E7800 Pure hypercholesterolemia, unspecified: Secondary | ICD-10-CM | POA: Diagnosis not present

## 2021-04-24 LAB — CBC WITH DIFFERENTIAL/PLATELET
Basophils Absolute: 0.1 10*3/uL (ref 0.0–0.2)
Basos: 1 %
EOS (ABSOLUTE): 0.1 10*3/uL (ref 0.0–0.4)
Eos: 1 %
Hematocrit: 46.1 % (ref 37.5–51.0)
Hemoglobin: 15.5 g/dL (ref 13.0–17.7)
Immature Grans (Abs): 0 10*3/uL (ref 0.0–0.1)
Immature Granulocytes: 0 %
Lymphocytes Absolute: 1.8 10*3/uL (ref 0.7–3.1)
Lymphs: 25 %
MCH: 30.3 pg (ref 26.6–33.0)
MCHC: 33.6 g/dL (ref 31.5–35.7)
MCV: 90 fL (ref 79–97)
Monocytes Absolute: 0.8 10*3/uL (ref 0.1–0.9)
Monocytes: 12 %
Neutrophils Absolute: 4.4 10*3/uL (ref 1.4–7.0)
Neutrophils: 61 %
Platelets: 160 10*3/uL (ref 150–450)
RBC: 5.11 x10E6/uL (ref 4.14–5.80)
RDW: 12.5 % (ref 11.6–15.4)
WBC: 7.2 10*3/uL (ref 3.4–10.8)

## 2021-04-24 LAB — CMP14+EGFR
ALT: 13 IU/L (ref 0–44)
AST: 17 IU/L (ref 0–40)
Albumin/Globulin Ratio: 1.9 (ref 1.2–2.2)
Albumin: 4.3 g/dL (ref 3.6–4.6)
Alkaline Phosphatase: 79 IU/L (ref 44–121)
BUN/Creatinine Ratio: 11 (ref 10–24)
BUN: 13 mg/dL (ref 8–27)
Bilirubin Total: 0.7 mg/dL (ref 0.0–1.2)
CO2: 26 mmol/L (ref 20–29)
Calcium: 9.7 mg/dL (ref 8.6–10.2)
Chloride: 104 mmol/L (ref 96–106)
Creatinine, Ser: 1.21 mg/dL (ref 0.76–1.27)
Globulin, Total: 2.3 g/dL (ref 1.5–4.5)
Glucose: 79 mg/dL (ref 70–99)
Potassium: 4.6 mmol/L (ref 3.5–5.2)
Sodium: 142 mmol/L (ref 134–144)
Total Protein: 6.6 g/dL (ref 6.0–8.5)
eGFR: 60 mL/min/{1.73_m2} (ref >59)

## 2021-04-24 LAB — LIPID PANEL
Chol/HDL Ratio: 3 ratio (ref 0.0–5.0)
Cholesterol, Total: 134 mg/dL (ref 100–199)
HDL: 44 mg/dL (ref >39)
LDL Chol Calc (NIH): 74 mg/dL (ref 0–99)
Triglycerides: 82 mg/dL (ref 0–149)
VLDL Cholesterol Cal: 16 mg/dL (ref 5–40)

## 2021-04-24 LAB — TSH: TSH: 2.63 u[IU]/mL (ref 0.450–4.500)

## 2021-04-26 ENCOUNTER — Other Ambulatory Visit: Payer: Self-pay

## 2021-04-26 ENCOUNTER — Ambulatory Visit (INDEPENDENT_AMBULATORY_CARE_PROVIDER_SITE_OTHER): Payer: Medicare HMO | Admitting: Family Medicine

## 2021-04-26 ENCOUNTER — Encounter: Payer: Self-pay | Admitting: Family Medicine

## 2021-04-26 VITALS — BP 130/72 | HR 79 | Ht 67.0 in | Wt 171.0 lb

## 2021-04-26 DIAGNOSIS — E7800 Pure hypercholesterolemia, unspecified: Secondary | ICD-10-CM | POA: Diagnosis not present

## 2021-04-26 DIAGNOSIS — E0500 Thyrotoxicosis with diffuse goiter without thyrotoxic crisis or storm: Secondary | ICD-10-CM

## 2021-04-26 DIAGNOSIS — I10 Essential (primary) hypertension: Secondary | ICD-10-CM

## 2021-04-26 DIAGNOSIS — K219 Gastro-esophageal reflux disease without esophagitis: Secondary | ICD-10-CM

## 2021-04-26 DIAGNOSIS — E0590 Thyrotoxicosis, unspecified without thyrotoxic crisis or storm: Secondary | ICD-10-CM

## 2021-04-26 MED ORDER — PANTOPRAZOLE SODIUM 40 MG PO TBEC: 40.0000 mg | DELAYED_RELEASE_TABLET | Freq: Two times a day (BID) | ORAL | 3 refills | Status: DC

## 2021-04-26 MED ORDER — HYDROCHLOROTHIAZIDE 12.5 MG PO CAPS: 25.0000 mg | ORAL_CAPSULE | Freq: Every day | ORAL | 3 refills | Status: DC

## 2021-04-26 MED ORDER — TELMISARTAN 40 MG PO TABS: 20.0000 mg | ORAL_TABLET | Freq: Every day | ORAL | 3 refills | Status: DC

## 2021-04-26 MED ORDER — TAMSULOSIN HCL 0.4 MG PO CAPS: 0.4000 mg | ORAL_CAPSULE | Freq: Every day | ORAL | 3 refills | Status: DC

## 2021-04-26 MED ORDER — METOPROLOL TARTRATE 25 MG PO TABS
25.0000 mg | ORAL_TABLET | Freq: Two times a day (BID) | ORAL | 3 refills | Status: DC
Start: 2021-04-26 — End: 2021-12-24

## 2021-04-26 MED ORDER — ROSUVASTATIN CALCIUM 10 MG PO TABS
10.0000 mg | ORAL_TABLET | Freq: Every day | ORAL | 3 refills | Status: DC
Start: 2021-04-26 — End: 2021-12-24

## 2021-04-26 NOTE — Progress Notes (Signed)
BP 130/72   Pulse 79   Ht '5\' 7"'  (1.702 m)   Wt 171 lb (77.6 kg)   SpO2 97%   BMI 26.78 kg/m    Subjective:   Patient ID: Mario Smith, male    DOB: [birth year], 80 y.o.   MRN: [id number]  HPI: Mario Smith is a 80 y.o. male presenting on 04/26/2021 for Medical Management of Chronic Issues, Hypertension, and Hypothyroidism   HPI Hypertension Patient is currently on telmisartan and hctz and metoprolol, and their blood pressure today is 130/72. Patient denies any lightheadedness or dizziness. Patient denies headaches, blurred vision, chest pains, shortness of breath, or weakness. Denies any side effects from medication and is content with current medication.   Hyperlipidemia Patient is coming in for recheck of his hyperlipidemia. The patient is currently taking crestor and fish oils. They deny any issues with myalgias or history of liver damage from it. They deny any focal numbness or weakness or chest pain.   Hyperthyroidism and Graves' disease recheck Patient is coming in for thyroid recheck today as well. They deny any issues with hair changes or heat or cold problems or diarrhea or constipation. They deny any chest pain or palpitations. They are currently on methimazole  GERD Patient is currently on pantoprazole.  She denies any major symptoms or abdominal pain or belching or burping. She denies any blood in her stool or lightheadedness or dizziness.   Relevant past medical, surgical, family and social history reviewed and updated as indicated. Interim medical history since our last visit reviewed. Allergies and medications reviewed and updated.  Review of Systems  Constitutional:  Negative for chills and fever.  Eyes:  Negative for visual disturbance.  Respiratory:  Negative for shortness of breath and wheezing.   Cardiovascular:  Negative for chest pain and leg swelling.  Musculoskeletal:  Negative for back pain and gait problem.  Skin:  Negative for rash.  Neurological:   Negative for dizziness, weakness and numbness.  All other systems reviewed and are negative.  Per HPI unless specifically indicated above   Allergies as of 04/26/2021       Reactions   Simvastatin Other (See Comments)   Leg cramps   Sulfa Antibiotics    UNSPECIFIED REACTION    Codeine Nausea And Vomiting   Hydrocodone Nausea And Vomiting   Meclizine Nausea Only   Niacin And Related Other (See Comments)   Flushing in the face and redness    Ondansetron Other (See Comments)   Tremors   Phenergan [promethazine Hcl] Other (See Comments)   Tremors        Medication List        Accurate as of April 26, 2021 11:38 AM. If you have any questions, ask your nurse or doctor.          albuterol 108 (90 Base) MCG/ACT inhaler Commonly known as: VENTOLIN HFA Inhale 2 puffs into the lungs every 6 (six) hours as needed for wheezing or shortness of breath.   carboxymethylcellulose 0.5 % Soln Commonly known as: REFRESH PLUS Place 1 drop into both eyes as needed (dry eyes).   Co Q 10 100 MG Caps Take 100 mg by mouth daily.   Fish Oil 1000 MG Caps Take 1,000 mg by mouth daily.   fluticasone 50 MCG/ACT nasal spray Commonly known as: FLONASE SPRAY 2 SPRAYS INTO EACH NOSTRIL EVERY DAY   guaiFENesin 600 MG 12 hr tablet Commonly known as: Mucinex Take 1-2 tablets every 12 hours as  needed for congestion.   hydrochlorothiazide 12.5 MG capsule Commonly known as: MICROZIDE Take 2 capsules (25 mg total) by mouth daily.   methimazole 5 MG tablet Commonly known as: TAPAZOLE Take 1 tablet (5 mg total) by mouth 2 (two) times a week.   metoprolol tartrate 25 MG tablet Commonly known as: LOPRESSOR Take 1 tablet (25 mg total) by mouth 2 (two) times daily.   pantoprazole 40 MG tablet Commonly known as: PROTONIX Take 1 tablet (40 mg total) by mouth 2 (two) times daily.   rosuvastatin 10 MG tablet Commonly known as: CRESTOR Take 1 tablet (10 mg total) by mouth daily.    tamsulosin 0.4 MG Caps capsule Commonly known as: FLOMAX Take 1 capsule (0.4 mg total) by mouth daily.   telmisartan 40 MG tablet Commonly known as: MICARDIS Take 0.5 tablets (20 mg total) by mouth daily.   Vitamin D3 50 MCG (2000 UT) Tabs Take 2,000 Units by mouth daily.         Objective:   BP 130/72   Pulse 79   Ht '5\' 7"'  (1.702 m)   Wt 171 lb (77.6 kg)   SpO2 97%   BMI 26.78 kg/m   Wt Readings from Last 3 Encounters:  04/26/21 171 lb (77.6 kg)  04/07/21 175 lb (79.4 kg)  12/24/20 175 lb (79.4 kg)    Physical Exam Vitals and nursing note reviewed.  Constitutional:      General: He is not in acute distress.    Appearance: He is well-developed. He is not diaphoretic.  Eyes:     General: No scleral icterus.       Right eye: No discharge.     Conjunctiva/sclera: Conjunctivae normal.  Neck:     Thyroid: No thyromegaly.  Cardiovascular:     Rate and Rhythm: Normal rate and regular rhythm.     Heart sounds: Normal heart sounds. No murmur heard. Pulmonary:     Effort: Pulmonary effort is normal. No respiratory distress.     Breath sounds: Normal breath sounds. No wheezing.  Musculoskeletal:        General: No swelling. Normal range of motion.     Cervical back: Neck supple.  Lymphadenopathy:     Cervical: No cervical adenopathy.  Skin:    General: Skin is warm and dry.     Findings: No rash.  Neurological:     Mental Status: He is alert and oriented to person, place, and time.     Coordination: Coordination normal.  Psychiatric:        Behavior: Behavior normal.    Results for orders placed or performed in visit on 04/23/21  CBC with Differential/Platelet  Result Value Ref Range   WBC 7.2 3.4 - 10.8 x10E3/uL   RBC 5.11 4.14 - 5.80 x10E6/uL   Hemoglobin 15.5 13.0 - 17.7 g/dL   Hematocrit 46.1 37.5 - 51.0 %   MCV 90 79 - 97 fL   MCH 30.3 26.6 - 33.0 pg   MCHC 33.6 31.5 - 35.7 g/dL   RDW 12.5 11.6 - 15.4 %   Platelets 160 150 - 450 x10E3/uL    Neutrophils 61 Not Estab. %   Lymphs 25 Not Estab. %   Monocytes 12 Not Estab. %   Eos 1 Not Estab. %   Basos 1 Not Estab. %   Neutrophils Absolute 4.4 1.4 - 7.0 x10E3/uL   Lymphocytes Absolute 1.8 0.7 - 3.1 x10E3/uL   Monocytes Absolute 0.8 0.1 - 0.9 x10E3/uL   EOS (  ABSOLUTE) 0.1 0.0 - 0.4 x10E3/uL   Basophils Absolute 0.1 0.0 - 0.2 x10E3/uL   Immature Granulocytes 0 Not Estab. %   Immature Grans (Abs) 0.0 0.0 - 0.1 x10E3/uL  CMP14+EGFR  Result Value Ref Range   Glucose 79 70 - 99 mg/dL   BUN 13 8 - 27 mg/dL   Creatinine, Ser 1.21 0.76 - 1.27 mg/dL   eGFR 60 >59 mL/min/1.73   BUN/Creatinine Ratio 11 10 - 24   Sodium 142 134 - 144 mmol/L   Potassium 4.6 3.5 - 5.2 mmol/L   Chloride 104 96 - 106 mmol/L   CO2 26 20 - 29 mmol/L   Calcium 9.7 8.6 - 10.2 mg/dL   Total Protein 6.6 6.0 - 8.5 g/dL   Albumin 4.3 3.6 - 4.6 g/dL   Globulin, Total 2.3 1.5 - 4.5 g/dL   Albumin/Globulin Ratio 1.9 1.2 - 2.2   Bilirubin Total 0.7 0.0 - 1.2 mg/dL   Alkaline Phosphatase 79 44 - 121 IU/L   AST 17 0 - 40 IU/L   ALT 13 0 - 44 IU/L  Lipid panel  Result Value Ref Range   Cholesterol, Total 134 100 - 199 mg/dL   Triglycerides 82 0 - 149 mg/dL   HDL 44 >39 mg/dL   VLDL Cholesterol Cal 16 5 - 40 mg/dL   LDL Chol Calc (NIH) 74 0 - 99 mg/dL   Chol/HDL Ratio 3.0 0.0 - 5.0 ratio  TSH  Result Value Ref Range   TSH 2.630 0.450 - 4.500 uIU/mL    Assessment & Plan:   Problem List Items Addressed This Visit       Cardiovascular and Mediastinum   Hypertension - Primary   Relevant Medications   hydrochlorothiazide (MICROZIDE) 12.5 MG capsule   metoprolol tartrate (LOPRESSOR) 25 MG tablet   rosuvastatin (CRESTOR) 10 MG tablet   telmisartan (MICARDIS) 40 MG tablet     Digestive   Gastroesophageal reflux disease   Relevant Medications   pantoprazole (PROTONIX) 40 MG tablet     Endocrine   Graves disease   Relevant Medications   metoprolol tartrate (LOPRESSOR) 25 MG tablet   Hyperthyroidism  without crisis   Relevant Medications   metoprolol tartrate (LOPRESSOR) 25 MG tablet     Other   Hyperlipidemia   Relevant Medications   hydrochlorothiazide (MICROZIDE) 12.5 MG capsule   metoprolol tartrate (LOPRESSOR) 25 MG tablet   rosuvastatin (CRESTOR) 10 MG tablet   telmisartan (MICARDIS) 40 MG tablet    Pulm looks good, blood pressure looks good, no changes, continue current medicine. Follow up plan: Return in about 4 months (around 08/24/2021), or if symptoms worsen or fail to improve, for Hypertension and cholesterol and thyroid recheck.  Counseling provided for all of the vaccine components No orders of the defined types were placed in this encounter.   Caryl Pina, MD [facility] 04/26/2021, 11:38 AM

## 2021-05-12 ENCOUNTER — Other Ambulatory Visit: Payer: Self-pay

## 2021-05-12 ENCOUNTER — Ambulatory Visit (INDEPENDENT_AMBULATORY_CARE_PROVIDER_SITE_OTHER): Payer: Medicare HMO

## 2021-05-12 DIAGNOSIS — E538 Deficiency of other specified B group vitamins: Secondary | ICD-10-CM

## 2021-05-12 NOTE — Progress Notes (Signed)
Cyanocobalamin injection given to right deltoid.  Patient tolerated well. 

## 2021-05-18 ENCOUNTER — Ambulatory Visit: Payer: Medicare HMO | Admitting: Physician Assistant

## 2021-05-18 ENCOUNTER — Ambulatory Visit (HOSPITAL_COMMUNITY)
Admission: RE | Admit: 2021-05-18 | Discharge: 2021-05-18 | Disposition: A | Discharge status: Home / Self Care | Payer: Medicare HMO | Source: Ambulatory Visit | Attending: Physician Assistant | Admitting: Physician Assistant

## 2021-05-18 ENCOUNTER — Other Ambulatory Visit: Payer: Self-pay

## 2021-05-18 VITALS — BP 144/81 | HR 67 | Temp 98.1°F | Resp 18 | Ht 67.0 in | Wt 169.0 lb

## 2021-05-18 DIAGNOSIS — I8390 Asymptomatic varicose veins of unspecified lower extremity: Secondary | ICD-10-CM | POA: Diagnosis not present

## 2021-05-18 DIAGNOSIS — I8391 Asymptomatic varicose veins of right lower extremity: Secondary | ICD-10-CM

## 2021-05-18 DIAGNOSIS — I872 Venous insufficiency (chronic) (peripheral): Secondary | ICD-10-CM

## 2021-05-18 NOTE — Progress Notes (Signed)
Requested by:  Worthy Rancher, MD [place],  [place] [address]  Reason for consultation: R > L painful veins    History of Present Illness   Mario Smith is a 80 y.o. ([birth year]) male who presents for evaluation of R > L painful veins. He explains that a couple months ago he began having burning, stinging, and cramping in his legs. Right more so then the left. He says it happens throughout day and also at night time. The cramping especially happens only at night and is very painful. He says this happens usually 1x/ week. He otherwise says he notices the stinging and burning seem to be worse on days where he is up on his feet more. He does have a little swelling around his ankles as well but this is not as bothersome. He was seen by his PCP who recommended knee high compression stockings as well as vascular evaluation. He reports that the stockings have been helping a lot but admittedly he says he doesn't always remember to put them on. He does not regularly elevate. 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 He has no history of DVT. Says he may have some family history of venous disease in his sister. He explains that over the past 2-3 years he has had some medical issues that have really caused him to slow down so he is not as active as he would like to be.   Venous symptoms include: burning, stinging, swelling, cramping Onset/duration:  <6 months  Occupation:  retired,  Aggravating factors: sitting, standing, ambulating Alleviating factors: elevation Compression:  yes Helps:  yes Pain medications:  no Previous vein procedures:  no History of DVT:  No  Past Medical History:  Diagnosis Date   Abdominal pain 10/10/2017   Arthritis    Back pain    Colon polyp 2011   tubular adenoma.  no HG dysplasia.     Constipation    COPD (chronic obstructive  pulmonary disease) (HCC)    no per pt   Dehydration 10/10/2017   Dyspnea    with exertion   ED (erectile dysfunction)    Essential hypertension, benign    Gallbladder sludge 10/2017   GERD (gastroesophageal reflux disease)    acute gastritis   Graves disease    History of kidney stones    HTN (hypertension)    Other and unspecified hyperlipidemia    PONV (postoperative nausea and vomiting)    Pt's wife also said that when he had a nerve block in 2015 here, pt had a reaction immediately. 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 Higher risk screening study, sister's hx of colon CA.     ESOPHAGOGASTRODUODENOSCOPY (EGD) WITH PROPOFOL N/A 10/11/2017   Procedure: ESOPHAGOGASTRODUODENOSCOPY (EGD) WITH PROPOFOL;  Surgeon: Mauri Pole, MD;  Location: [facility];  Service: Endoscopy;  Laterality: N/A;   EYE SURGERY     lid-lift   SPINAL FUSION  2001   cervical   TENDON REPAIR Right 05/16/2014   Procedure: RIGHT RING FINGER FLEXOR TENDON REPAIR WITH PULL OUT BUTTON;  Surgeon: Roseanne Kaufman, MD;  Location: [facility];  Service: Orthopedics;  Laterality: Right;   UPPER GASTROINTESTINAL ENDOSCOPY      Social History   Socioeconomic History   Marital status: Married    Spouse name: Mario Smith   Number of children: 1   Years of education: 8   Highest education level: 8th grade  Occupational History   Occupation: retired  Tobacco Use   Smoking status: Former    Types: Cigarettes     Quit date: 12/19/1992    Years since quitting: 28.4   Smokeless tobacco: Former    Types: Chew    Quit date: 12/19/1992  Vaping Use   Vaping Use: Never used  Substance and Sexual Activity   Alcohol use: Not Currently   Drug use: No   Sexual activity: Yes  Other Topics Concern   Not on file  Social History Narrative   Not on file   Social Determinants of Health   Financial Resource Strain: Low Risk    Difficulty of Paying Living Expenses: Not hard at all  Food Insecurity: No Food Insecurity   Worried About Charity fundraiser in the Last Year: Never true   [place] in the Last Year: Never true  Transportation Needs: No Transportation Needs   Lack of Transportation (Medical): No   Lack of Transportation (Non-Medical): No  Physical Activity: Inactive   Days of Exercise per Week: 0 days   Minutes of Exercise per Session: 0 min  Stress: No Stress Concern Present   Feeling of Stress : Not at all  Social Connections: Socially Integrated   Frequency of Communication with Friends and Family: More than three times a week   Frequency of Social Gatherings with Friends and Family: More than three times a week   Attends Religious Services: More than 4 times per year   Active Member of [employer] or Organizations: Yes   Attends Music therapist: More than 4 times per year   Marital Status: Married  Human resources officer Violence: Not At Risk   Fear of Current or Ex-Partner: No   Emotionally Abused: No   Physically Abused: No   Sexually Abused: No    Family History  Problem Relation Age of Onset   Heart disease Mother    COPD Mother    Heart disease Father    COPD Father    Alcohol abuse Father    Cancer Sister        colon cancer   Emphysema Sister    Colon cancer Sister    Cancer Brother        prostate   Emphysema Sister    Prostate cancer Brother    Healthy Daughter    Esophageal cancer Neg Hx    Stomach cancer Neg Hx    Rectal cancer Neg Hx     Current  Outpatient Medications  Medication Sig Dispense Refill   albuterol (VENTOLIN HFA) 108 (90 Base) MCG/ACT inhaler Inhale 2 puffs into the lungs every 6 (six) hours as needed for wheezing or shortness of breath. 18 g 1   carboxymethylcellulose (REFRESH PLUS) 0.5 % SOLN Place 1 drop into both eyes as needed (dry eyes).  Cholecalciferol (VITAMIN D3) 2000 UNITS TABS Take 2,000 Units by mouth daily.     Coenzyme Q10 (CO Q 10) 100 MG CAPS Take 100 mg by mouth daily.     fluticasone (FLONASE) 50 MCG/ACT nasal spray SPRAY 2 SPRAYS INTO EACH NOSTRIL EVERY DAY 48 mL 1   guaiFENesin (MUCINEX) 600 MG 12 hr tablet Take 1-2 tablets every 12 hours as needed for congestion. 30 tablet 0   hydrochlorothiazide (MICROZIDE) 12.5 MG capsule Take 2 capsules (25 mg total) by mouth daily. 180 capsule 3   methimazole (TAPAZOLE) 5 MG tablet Take 1 tablet (5 mg total) by mouth 2 (two) times a week. 90 tablet 3   metoprolol tartrate (LOPRESSOR) 25 MG tablet Take 1 tablet (25 mg total) by mouth 2 (two) times daily. 180 tablet 3   Omega-3 Fatty Acids (FISH OIL) 1000 MG CAPS Take 1,000 mg by mouth daily.     pantoprazole (PROTONIX) 40 MG tablet Take 1 tablet (40 mg total) by mouth 2 (two) times daily. 180 tablet 3   rosuvastatin (CRESTOR) 10 MG tablet Take 1 tablet (10 mg total) by mouth daily. 90 tablet 3   tamsulosin (FLOMAX) 0.4 MG CAPS capsule Take 1 capsule (0.4 mg total) by mouth daily. 90 capsule 3   telmisartan (MICARDIS) 40 MG tablet Take 0.5 tablets (20 mg total) by mouth daily. 45 tablet 3   Current Facility-Administered Medications  Medication Dose Route Frequency Provider Last Rate Last Admin   cyanocobalamin ((VITAMIN B-12)) injection 1,000 mcg  1,000 mcg Intramuscular Q30 days Dettinger, Fransisca Kaufmann, MD   1,000 mcg at 05/12/21 1028    Allergies  Allergen Reactions   Simvastatin Other (See Comments)    Leg cramps   Sulfa Antibiotics     UNSPECIFIED REACTION    Codeine Nausea And Vomiting   Hydrocodone  Nausea And Vomiting   Meclizine Nausea Only   Niacin And Related Other (See Comments)    Flushing in the face and redness    Ondansetron Other (See Comments)    Tremors    Phenergan [Promethazine Hcl] Other (See Comments)    Tremors     REVIEW OF SYSTEMS (negative unless checked):   Cardiac:  []  Chest pain or chest pressure? []  Shortness of breath upon activity? []  Shortness of breath when lying flat? []  Irregular heart rhythm?  Vascular:  []  Pain in calf, thigh, or hip brought on by walking? []  Pain in feet at night that wakes you up from your sleep? []  Blood clot in your veins? []  Leg swelling?  Pulmonary:  []  Oxygen at home? []  Productive cough? []  Wheezing?  Neurologic:  []  Sudden weakness in arms or legs? []  Sudden numbness in arms or legs? []  Sudden onset of difficult speaking or slurred speech? []  Temporary loss of vision in one eye? []  Problems with dizziness?  Gastrointestinal:  []  Blood in stool? []  Vomited blood?  Genitourinary:  []  Burning when urinating? []  Blood in urine?  Psychiatric:  []  Major depression  Hematologic:  []  Bleeding problems? []  Problems with blood clotting?  Dermatologic:  []  Rashes or ulcers?  Constitutional:  []  Fever or chills?  Ear/Nose/Throat:  []  Change in hearing? []  Nose bleeds? []  Sore throat?  Musculoskeletal:  []  Back pain? []  Joint pain? []  Muscle pain?   Physical Examination     Vitals:   05/18/21 1358  BP: (!) 144/81  Pulse: 67  Resp: 18  Temp: 98.1 F (36.7 C)  TempSrc: Oral  SpO2: 96%  Weight: 169 lb (76.7 kg)  Height: 5\' 7"  (1.702 m)   Body mass index is 26.47 kg/m.  General:  WDWN in NAD; vital signs documented above Gait: Normal HENT: WNL, normocephalic Pulmonary: normal non-labored breathing , without wheezing Cardiac:  HR, without  Murmurs without carotid bruit Abdomen: soft Vascular Exam/Pulses:  Right Left  Radial 2+ (normal) 2+ (normal)  Femoral 2+ (normal) 2+  (normal)  Popliteal 2+ (normal) 2+ (normal)  DP 2+ (normal) 2+ (normal)  PT absent absent   Extremities: without varicose veins, with reticular veins BLE, without edema, with very minimal stasis pigmentation changes of right > left leg, without lipodermatosclerosis, without ulcers Musculoskeletal: no muscle wasting or atrophy  Neurologic: A&O X 3;  No focal weakness or paresthesias are detected Psychiatric:  The pt has Normal affect.  Non-invasive Vascular Imaging   BLE Venous Insufficiency Duplex (05/18/21):  RLE:  No DVT and SVT GSV reflux SFJ, mid thigh, prox calf and mid calf , GSV diameter 0.31-0.39 No SSV reflux  No deep venous reflux  Medical Decision Making   Quantavius R Whittington is a 80 y.o. male who presents with: RLE chronic venous insufficiency with pain. His duplex today shows no DVT or SVT. He does have superficial venous insufficiency in the GSV although his GSV is small. No Deep or Superficial venous reflux. Based on the patient's history and examination he is not a candidate for any more invasive venous interventions such as lazer ablation. I recommend daily elevation 20-30 minutes above level of heart, 15-20 mmHg of knee high compression stockings, exercise, and refraining from prolonged sitting or standing. He was measured for knee high compression stockings at todays visit and purchased some from our office. He will follow up as needed if he has new or worsening symptoms.    Karoline Caldwell, PA-C [facility] Office: [phone]  05/18/2021, 2:02 PM  Clinic MD: Roxanne Mins

## 2021-05-21 DIAGNOSIS — J329 Chronic sinusitis, unspecified: Secondary | ICD-10-CM | POA: Diagnosis not present

## 2021-05-21 DIAGNOSIS — R051 Acute cough: Secondary | ICD-10-CM | POA: Diagnosis not present

## 2021-05-26 DIAGNOSIS — L821 Other seborrheic keratosis: Secondary | ICD-10-CM | POA: Diagnosis not present

## 2021-05-26 DIAGNOSIS — L819 Disorder of pigmentation, unspecified: Secondary | ICD-10-CM | POA: Diagnosis not present

## 2021-05-26 DIAGNOSIS — L570 Actinic keratosis: Secondary | ICD-10-CM | POA: Diagnosis not present

## 2021-05-26 DIAGNOSIS — D225 Melanocytic nevi of trunk: Secondary | ICD-10-CM | POA: Diagnosis not present

## 2021-05-26 DIAGNOSIS — L814 Other melanin hyperpigmentation: Secondary | ICD-10-CM | POA: Diagnosis not present

## 2021-05-27 DIAGNOSIS — R059 Cough, unspecified: Secondary | ICD-10-CM | POA: Diagnosis not present

## 2021-05-27 DIAGNOSIS — J329 Chronic sinusitis, unspecified: Secondary | ICD-10-CM | POA: Diagnosis not present

## 2021-06-02 NOTE — Progress Notes (Signed)
Pt declines visit after we got started - he says he keeps having coughing fits - he is taking antibiotics an steroids - I will send a message to scheduling to reschedule his AWV.

## 2021-06-14 ENCOUNTER — Ambulatory Visit (INDEPENDENT_AMBULATORY_CARE_PROVIDER_SITE_OTHER): Payer: Medicare HMO | Admitting: *Deleted

## 2021-06-14 DIAGNOSIS — E538 Deficiency of other specified B group vitamins: Secondary | ICD-10-CM

## 2021-06-14 NOTE — Progress Notes (Signed)
B12 tolerated well to left deltoid

## 2021-07-06 ENCOUNTER — Telehealth: Payer: Self-pay | Admitting: Family Medicine

## 2021-07-06 NOTE — Telephone Encounter (Signed)
Left message for patient to call back and schedule Medicare Annual Wellness Visit (AWV) to be completed by video or phone.   Last AWV: 05/27/2020  Please schedule at anytime with [place]  45 minute appointment  Any questions, please contact me at [phone]

## 2021-07-14 DIAGNOSIS — J329 Chronic sinusitis, unspecified: Secondary | ICD-10-CM | POA: Diagnosis not present

## 2021-07-14 DIAGNOSIS — R051 Acute cough: Secondary | ICD-10-CM | POA: Diagnosis not present

## 2021-07-16 ENCOUNTER — Ambulatory Visit (INDEPENDENT_AMBULATORY_CARE_PROVIDER_SITE_OTHER): Payer: Medicare HMO

## 2021-07-16 DIAGNOSIS — E538 Deficiency of other specified B group vitamins: Secondary | ICD-10-CM

## 2021-07-16 NOTE — Progress Notes (Signed)
Pt given Cyanocobalamin 1016mcg/ml IM on r-arm. Pt tol well and left ambulatory

## 2021-08-03 ENCOUNTER — Telehealth: Payer: Self-pay | Admitting: Family Medicine

## 2021-08-03 NOTE — Telephone Encounter (Signed)
Patient aware that we can move his wife's appointment to 10:00am and complete them together. Her appointment was moved to 10:30am.

## 2021-08-10 ENCOUNTER — Ambulatory Visit: Payer: Medicare HMO

## 2021-08-13 ENCOUNTER — Ambulatory Visit (INDEPENDENT_AMBULATORY_CARE_PROVIDER_SITE_OTHER): Payer: Medicare HMO | Admitting: *Deleted

## 2021-08-13 DIAGNOSIS — E538 Deficiency of other specified B group vitamins: Secondary | ICD-10-CM

## 2021-08-17 ENCOUNTER — Ambulatory Visit: Payer: Medicare HMO

## 2021-08-23 ENCOUNTER — Other Ambulatory Visit: Payer: Medicare HMO

## 2021-08-23 DIAGNOSIS — E0590 Thyrotoxicosis, unspecified without thyrotoxic crisis or storm: Secondary | ICD-10-CM | POA: Diagnosis not present

## 2021-08-23 DIAGNOSIS — I10 Essential (primary) hypertension: Secondary | ICD-10-CM

## 2021-08-23 DIAGNOSIS — E7800 Pure hypercholesterolemia, unspecified: Secondary | ICD-10-CM | POA: Diagnosis not present

## 2021-08-24 LAB — CBC WITH DIFFERENTIAL/PLATELET
Basophils Absolute: 0.1 10*3/uL (ref 0.0–0.2)
Basos: 1 %
EOS (ABSOLUTE): 0.1 10*3/uL (ref 0.0–0.4)
Eos: 1 %
Hematocrit: 47.3 % (ref 37.5–51.0)
Hemoglobin: 15.7 g/dL (ref 13.0–17.7)
Immature Grans (Abs): 0 10*3/uL (ref 0.0–0.1)
Immature Granulocytes: 0 %
Lymphocytes Absolute: 1.6 10*3/uL (ref 0.7–3.1)
Lymphs: 24 %
MCH: 30.8 pg (ref 26.6–33.0)
MCHC: 33.2 g/dL (ref 31.5–35.7)
MCV: 93 fL (ref 79–97)
Monocytes Absolute: 0.6 10*3/uL (ref 0.1–0.9)
Monocytes: 9 %
Neutrophils Absolute: 4.2 10*3/uL (ref 1.4–7.0)
Neutrophils: 65 %
Platelets: 132 10*3/uL — ABNORMAL LOW (ref 150–450)
RBC: 5.09 x10E6/uL (ref 4.14–5.80)
RDW: 13.1 % (ref 11.6–15.4)
WBC: 6.6 10*3/uL (ref 3.4–10.8)

## 2021-08-24 LAB — CMP14+EGFR
ALT: 13 IU/L (ref 0–44)
AST: 17 IU/L (ref 0–40)
Albumin/Globulin Ratio: 1.9 (ref 1.2–2.2)
Albumin: 4.3 g/dL (ref 3.6–4.6)
Alkaline Phosphatase: 72 IU/L (ref 44–121)
BUN/Creatinine Ratio: 13 (ref 10–24)
BUN: 15 mg/dL (ref 8–27)
Bilirubin Total: 0.8 mg/dL (ref 0.0–1.2)
CO2: 25 mmol/L (ref 20–29)
Calcium: 9.3 mg/dL (ref 8.6–10.2)
Chloride: 107 mmol/L — ABNORMAL HIGH (ref 96–106)
Creatinine, Ser: 1.19 mg/dL (ref 0.76–1.27)
Globulin, Total: 2.3 g/dL (ref 1.5–4.5)
Glucose: 116 mg/dL — ABNORMAL HIGH (ref 70–99)
Potassium: 4.2 mmol/L (ref 3.5–5.2)
Sodium: 143 mmol/L (ref 134–144)
Total Protein: 6.6 g/dL (ref 6.0–8.5)
eGFR: 61 mL/min/{1.73_m2} (ref >59)

## 2021-08-24 LAB — LIPID PANEL
Chol/HDL Ratio: 3.8 ratio (ref 0.0–5.0)
Cholesterol, Total: 147 mg/dL (ref 100–199)
HDL: 39 mg/dL — ABNORMAL LOW (ref >39)
LDL Chol Calc (NIH): 87 mg/dL (ref 0–99)
Triglycerides: 116 mg/dL (ref 0–149)
VLDL Cholesterol Cal: 21 mg/dL (ref 5–40)

## 2021-08-24 LAB — TSH: TSH: 3.1 u[IU]/mL (ref 0.450–4.500)

## 2021-08-25 ENCOUNTER — Ambulatory Visit (INDEPENDENT_AMBULATORY_CARE_PROVIDER_SITE_OTHER): Payer: Medicare HMO | Admitting: Family Medicine

## 2021-08-25 ENCOUNTER — Encounter: Payer: Self-pay | Admitting: Family Medicine

## 2021-08-25 VITALS — BP 150/76 | HR 64 | Ht 67.0 in | Wt 174.0 lb

## 2021-08-25 DIAGNOSIS — E0590 Thyrotoxicosis, unspecified without thyrotoxic crisis or storm: Secondary | ICD-10-CM

## 2021-08-25 DIAGNOSIS — E7800 Pure hypercholesterolemia, unspecified: Secondary | ICD-10-CM | POA: Diagnosis not present

## 2021-08-25 DIAGNOSIS — J301 Allergic rhinitis due to pollen: Secondary | ICD-10-CM

## 2021-08-25 DIAGNOSIS — Z23 Encounter for immunization: Secondary | ICD-10-CM

## 2021-08-25 DIAGNOSIS — K219 Gastro-esophageal reflux disease without esophagitis: Secondary | ICD-10-CM | POA: Diagnosis not present

## 2021-08-25 DIAGNOSIS — I10 Essential (primary) hypertension: Secondary | ICD-10-CM

## 2021-08-25 MED ORDER — FLUTICASONE PROPIONATE 50 MCG/ACT NA SUSP: NASAL | 3 refills | Status: DC

## 2021-08-25 NOTE — Progress Notes (Signed)
? ?BP (!) 150/76   Pulse 64   Ht _0  (1.702 m)   Wt 174 lb (78.9 kg)   SpO2 97%   BMI 27.25 kg/m?   ? ?Subjective:  ? ?Patient ID: Mario Smith, male    DOB: 03/18/1941, 81 y.o.   MRN: [id number] ? ?HPI: ?Mario Smith is a 81 y.o. male presenting on 08/25/2021 for No chief complaint on file. ? ? ?HPI ?Hyperthyroidism recheck ?Patient is coming in for thyroid recheck today as well. They deny any issues with hair changes or heat or cold problems or diarrhea or constipation. They deny any chest pain or palpitations. They are currently on methimazole ? ?Hypertension ?Patient is currently on hydrochlorothiazide and metoprolol and telmisartan, and their blood pressure today is 150/76. Patient denies any lightheadedness or dizziness. Patient denies headaches, blurred vision, chest pains, shortness of breath, or weakness. Denies any side effects from medication and is content with current medication.  ? ?Hyperlipidemia ?Patient is coming in for recheck of his hyperlipidemia. The patient is currently taking omega-3 and Crestor. They deny any issues with myalgias or history of liver damage from it. They deny any focal numbness or weakness or chest pain.  ? ?GERD ?Patient is currently on pantoprazole.  She denies any major symptoms or abdominal pain or belching or burping. She denies any blood in her stool or lightheadedness or dizziness.  ? ?Relevant past medical, surgical, family and social history reviewed and updated as indicated. Interim medical history since our last visit reviewed. ?Allergies and medications reviewed and updated. ? ?Review of Systems  ?Constitutional:  Negative for chills and fever.  ?Respiratory:  Negative for shortness of breath and wheezing.   ?Cardiovascular:  Negative for chest pain and leg swelling.  ?Musculoskeletal:  Negative for back pain and gait problem.  ?Skin:  Negative for rash.  ?Neurological:  Negative for dizziness, weakness and light-headedness.  ?All other systems reviewed and  are negative. ? ?Per HPI unless specifically indicated above ? ? ?Allergies as of 08/25/2021   ? ?   Reactions  ? Simvastatin Other (See Comments)  ? Leg cramps  ? Sulfa Antibiotics   ? UNSPECIFIED REACTION   ? Codeine Nausea And Vomiting  ? Doxycycline Nausea And Vomiting  ? Hydrocodone Nausea And Vomiting  ? Meclizine Nausea Only  ? Niacin And Related Other (See Comments)  ? Flushing in the face and redness   ? Ondansetron Other (See Comments)  ? Tremors  ? Phenergan [promethazine Hcl] Other (See Comments)  ? Tremors  ? ?  ? ?  ?Medication List  ?  ? ?  ? Accurate as of August 25, 2021 11:25 AM. If you have any questions, ask your nurse or doctor.  ?  ?  ? ?  ? ?STOP taking these medications   ? ?benzonatate 100 MG capsule ?Commonly known as: TESSALON ?Stopped by: Worthy Rancher, MD ?  ?guaiFENesin 600 MG 12 hr tablet ?Commonly known as: Mucinex ?Stopped by: Worthy Rancher, MD ?  ?predniSONE 10 MG tablet ?Commonly known as: DELTASONE ?Stopped by: Worthy Rancher, MD ?  ? ?  ? ?TAKE these medications   ? ?albuterol 108 (90 Base) MCG/ACT inhaler ?Commonly known as: VENTOLIN HFA ?Inhale 2 puffs into the lungs every 6 (six) hours as needed for wheezing or shortness of breath. ?  ?aspirin 81 MG EC tablet ?Take by mouth. ?  ?betamethasone (augmented) 0.05 % lotion ?Commonly known as: DIPROLENE ?SMARTSIG:Sparingly Topical Every Evening PRN ?  ?  carboxymethylcellulose 0.5 % Soln ?Commonly known as: REFRESH PLUS ?Place 1 drop into both eyes as needed (dry eyes). ?  ?Co Q 10 100 MG Caps ?Take 100 mg by mouth daily. ?  ?Fish Oil 1000 MG Caps ?Take 1,000 mg by mouth daily. ?  ?fluticasone 50 MCG/ACT nasal spray ?Commonly known as: FLONASE ?SPRAY 2 SPRAYS INTO EACH NOSTRIL EVERY DAY ?  ?hydrochlorothiazide 12.5 MG capsule ?Commonly known as: MICROZIDE ?Take 2 capsules (25 mg total) by mouth daily. ?  ?methimazole 5 MG tablet ?Commonly known as: TAPAZOLE ?Take 1 tablet (5 mg total) by mouth 2 (two) times a week. ?   ?metoprolol tartrate 25 MG tablet ?Commonly known as: LOPRESSOR ?Take 1 tablet (25 mg total) by mouth 2 (two) times daily. ?  ?pantoprazole 40 MG tablet ?Commonly known as: PROTONIX ?Take 1 tablet (40 mg total) by mouth 2 (two) times daily. ?  ?rosuvastatin 10 MG tablet ?Commonly known as: CRESTOR ?Take 1 tablet (10 mg total) by mouth daily. ?  ?tamsulosin 0.4 MG Caps capsule ?Commonly known as: FLOMAX ?Take 1 capsule (0.4 mg total) by mouth daily. ?  ?telmisartan 40 MG tablet ?Commonly known as: MICARDIS ?Take 0.5 tablets (20 mg total) by mouth daily. ?  ?Vitamin D3 50 MCG (2000 UT) Tabs ?Take 2,000 Units by mouth daily. ?  ? ?  ? ? ? ?Objective:  ? ?BP (!) 150/76   Pulse 64   Ht _0  (1.702 m)   Wt 174 lb (78.9 kg)   SpO2 97%   BMI 27.25 kg/m?   ?Wt Readings from Last 3 Encounters:  ?08/25/21 174 lb (78.9 kg)  ?05/18/21 169 lb (76.7 kg)  ?04/26/21 171 lb (77.6 kg)  ?  ?Physical Exam ?Vitals and nursing note reviewed.  ?Constitutional:   ?   General: He is not in acute distress. ?   Appearance: He is well-developed. He is not diaphoretic.  ?Eyes:  ?   General: No scleral icterus. ?   Conjunctiva/sclera: Conjunctivae normal.  ?Neck:  ?   Thyroid: No thyromegaly.  ?Cardiovascular:  ?   Rate and Rhythm: Normal rate and regular rhythm.  ?   Heart sounds: Normal heart sounds. No murmur heard. ?Pulmonary:  ?   Effort: Pulmonary effort is normal. No respiratory distress.  ?   Breath sounds: Normal breath sounds. No wheezing.  ?Musculoskeletal:     ?   General: Normal range of motion.  ?   Cervical back: Neck supple.  ?Lymphadenopathy:  ?   Cervical: No cervical adenopathy.  ?Skin: ?   General: Skin is warm and dry.  ?   Findings: No rash.  ?Neurological:  ?   Mental Status: He is alert and oriented to person, place, and time.  ?   Coordination: Coordination normal.  ?Psychiatric:     ?   Behavior: Behavior normal.  ? ? ?Results for orders placed or performed in visit on 08/23/21  ?CBC with Differential/Platelet   ?Result Value Ref Range  ? WBC 6.6 3.4 - 10.8 x10E3/uL  ? RBC 5.09 4.14 - 5.80 x10E6/uL  ? Hemoglobin 15.7 13.0 - 17.7 g/dL  ? Hematocrit 47.3 37.5 - 51.0 %  ? MCV 93 79 - 97 fL  ? MCH 30.8 26.6 - 33.0 pg  ? MCHC 33.2 31.5 - 35.7 g/dL  ? RDW 13.1 11.6 - 15.4 %  ? Platelets 132 (L) 150 - 450 x10E3/uL  ? Neutrophils 65 Not Estab. %  ? Lymphs 24 Not Estab. %  ?  Monocytes 9 Not Estab. %  ? Eos 1 Not Estab. %  ? Basos 1 Not Estab. %  ? Neutrophils Absolute 4.2 1.4 - 7.0 x10E3/uL  ? Lymphocytes Absolute 1.6 0.7 - 3.1 x10E3/uL  ? Monocytes Absolute 0.6 0.1 - 0.9 x10E3/uL  ? EOS (ABSOLUTE) 0.1 0.0 - 0.4 x10E3/uL  ? Basophils Absolute 0.1 0.0 - 0.2 x10E3/uL  ? Immature Granulocytes 0 Not Estab. %  ? Immature Grans (Abs) 0.0 0.0 - 0.1 x10E3/uL  ?CMP14+EGFR  ?Result Value Ref Range  ? Glucose 116 (H) 70 - 99 mg/dL  ? BUN 15 8 - 27 mg/dL  ? Creatinine, Ser 1.19 0.76 - 1.27 mg/dL  ? eGFR 61 >59 mL/min/1.73  ? BUN/Creatinine Ratio 13 10 - 24  ? Sodium 143 134 - 144 mmol/L  ? Potassium 4.2 3.5 - 5.2 mmol/L  ? Chloride 107 (H) 96 - 106 mmol/L  ? CO2 25 20 - 29 mmol/L  ? Calcium 9.3 8.6 - 10.2 mg/dL  ? Total Protein 6.6 6.0 - 8.5 g/dL  ? Albumin 4.3 3.6 - 4.6 g/dL  ? Globulin, Total 2.3 1.5 - 4.5 g/dL  ? Albumin/Globulin Ratio 1.9 1.2 - 2.2  ? Bilirubin Total 0.8 0.0 - 1.2 mg/dL  ? Alkaline Phosphatase 72 44 - 121 IU/L  ? AST 17 0 - 40 IU/L  ? ALT 13 0 - 44 IU/L  ?Lipid panel  ?Result Value Ref Range  ? Cholesterol, Total 147 100 - 199 mg/dL  ? Triglycerides 116 0 - 149 mg/dL  ? HDL 39 (L) >39 mg/dL  ? VLDL Cholesterol Cal 21 5 - 40 mg/dL  ? LDL Chol Calc (NIH) 87 0 - 99 mg/dL  ? Chol/HDL Ratio 3.8 0.0 - 5.0 ratio  ?TSH  ?Result Value Ref Range  ? TSH 3.100 0.450 - 4.500 uIU/mL  ? ? ?Assessment & Plan:  ? ?Problem List Items Addressed This Visit   ? ?  ? Cardiovascular and Mediastinum  ? Hypertension  ?  ? Digestive  ? Gastroesophageal reflux disease  ?  ? Endocrine  ? 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  ?  ? Other  ? Hyperlipidemia -  Primary  ? ?Other Visit Diagnoses   ? ? Non-seasonal allergic rhinitis due to pollen      ? Relevant Medications  ? fluticasone (FLONASE) 50 MCG/ACT nasal spray  ? Need for Tdap vaccination      ? Relevant Orders  ? Td

## 2021-08-30 ENCOUNTER — Ambulatory Visit: Payer: Medicare HMO

## 2021-09-08 ENCOUNTER — Ambulatory Visit (INDEPENDENT_AMBULATORY_CARE_PROVIDER_SITE_OTHER): Payer: Medicare HMO | Admitting: *Deleted

## 2021-09-08 DIAGNOSIS — Z23 Encounter for immunization: Secondary | ICD-10-CM

## 2021-09-08 NOTE — Progress Notes (Signed)
Shingrix given and patient tolerated well.  

## 2021-09-13 ENCOUNTER — Ambulatory Visit: Payer: Medicare HMO

## 2021-09-14 DIAGNOSIS — L298 Other pruritus: Secondary | ICD-10-CM | POA: Diagnosis not present

## 2021-09-14 DIAGNOSIS — L738 Other specified follicular disorders: Secondary | ICD-10-CM | POA: Diagnosis not present

## 2021-09-14 DIAGNOSIS — L814 Other melanin hyperpigmentation: Secondary | ICD-10-CM | POA: Diagnosis not present

## 2021-09-14 DIAGNOSIS — L821 Other seborrheic keratosis: Secondary | ICD-10-CM | POA: Diagnosis not present

## 2021-09-14 DIAGNOSIS — L570 Actinic keratosis: Secondary | ICD-10-CM | POA: Diagnosis not present

## 2021-09-14 DIAGNOSIS — D225 Melanocytic nevi of trunk: Secondary | ICD-10-CM | POA: Diagnosis not present

## 2021-10-08 ENCOUNTER — Ambulatory Visit (INDEPENDENT_AMBULATORY_CARE_PROVIDER_SITE_OTHER): Payer: Medicare HMO | Admitting: Emergency Medicine

## 2021-10-08 DIAGNOSIS — E538 Deficiency of other specified B group vitamins: Secondary | ICD-10-CM | POA: Diagnosis not present

## 2021-10-11 ENCOUNTER — Ambulatory Visit (INDEPENDENT_AMBULATORY_CARE_PROVIDER_SITE_OTHER): Payer: Medicare HMO | Admitting: Family Medicine

## 2021-10-11 ENCOUNTER — Encounter: Payer: Self-pay | Admitting: Family Medicine

## 2021-10-11 VITALS — BP 133/74 | HR 65 | Temp 98.5°F | Ht 67.0 in | Wt 169.4 lb

## 2021-10-11 DIAGNOSIS — I10 Essential (primary) hypertension: Secondary | ICD-10-CM

## 2021-10-11 DIAGNOSIS — L570 Actinic keratosis: Secondary | ICD-10-CM | POA: Diagnosis not present

## 2021-10-11 NOTE — Patient Instructions (Addendum)
Call [facility] [phone] ? ?Actinic Keratosis ?An actinic keratosis is a precancerous growth on the skin. If there is more than one growth, the condition is called actinic keratoses. Actinic keratoses appear most often on areas of skin that get a lot of sun exposure, including the scalp, face, ears, lips, upper back, forearms, and the backs of the hands. ?If left untreated, these growths may develop into a skin cancer called squamous cell carcinoma. It is important to have all these growths checked by a health care provider to determine the best treatment approach. ?What are the causes? ?Actinic keratoses are caused by getting too much ultraviolet (UV) radiation from the sun or other UV light sources. ?What increases the risk? ?You are more likely to develop this condition if you: ?Have light-colored skin and blue eyes. ?Have blond or red hair. ?Spend a lot of time in the sun. ?Do not protect your skin from the sun when outdoors. ?Are an older person. The risk of developing an actinic keratosis increases with age. ?What are the signs or symptoms? ?Actinic keratoses feel like scaly, rough spots of skin. Symptoms of this condition include growths that may: ?Be as small as a pinhead or as big as a quarter. ?Itch, hurt, or feel sensitive. ?Be skin-colored, light tan, dark tan, pink, or a combination of any of these colors. In most cases, the growths become red. ?Have a small piece of pink or gray skin (skin tag) growing from them. ?It may be easier to notice actinic keratoses by feeling them, rather than seeing them. Sometimes, actinic keratoses disappear, but many reappear a few days to a few weeks later. ?How is this diagnosed? ?This condition is usually diagnosed with a physical exam. ?A tissue sample may be removed from the actinic keratosis and examined under a microscope (biopsy). ?How is this treated? ?If needed, this condition may be treated by: ?Scraping off the actinic keratosis  (curettage). ?Freezing the actinic keratosis with liquid nitrogen (cryosurgery). This causes the growth to eventually fall off the skin. ?Applying medicated creams or gels to destroy the cells in the growth. ?Applying chemicals to the actinic keratosis to make the outer layers of skin peel off (chemical peel). ?Using photodynamic therapy. In this procedure, medicated cream is applied to the actinic keratosis. This cream increases your skin's sensitivity to light. Then, a strong light is aimed at the actinic keratosis to destroy cells in the growth. ?Follow these instructions at home: ?Skin care ?Apply cool, wet cloths (cool compresses) to the affected areas. ?Do not scratch your skin. ?Check your skin regularly for any growths, especially growths that: ?Start to itch or bleed. ?Change in size, shape, or color. ?Caring for the treated area ?Keep the treated area clean and dry as told by your health care provider. ?Do not apply any medicine, cream, or lotion to the treated area unless your health care provider tells you to do that. ?Do not pick at blisters or try to break them open. This can cause infection and scarring. ?If you have red or irritated skin after treatment, follow instructions from your health care provider about how to take care of the treated area. Make sure you: ?Wash your hands with soap and water before you change your bandage (dressing). If soap and water are not available, use hand sanitizer. ?Change your dressing as told by your health care provider. ?If you have red or irritated skin after treatment, check your treated area every day for signs of infection. Check for: ?Redness, swelling,  or pain. ?Fluid or blood. ?Warmth. ?Pus or a bad smell. ?General instructions ?Take or apply over-the-counter and prescription medicines only as told by your health care provider. ?Return to your normal activities as told by your health care provider. Ask your health care provider what activities are safe for  you. ?Have a skin exam done every year by a health care provider who is a skin specialist (dermatologist). ?Keep all follow-up visits as told by your health care provider. This is important. ?Lifestyle ?Do not use any products that contain nicotine or tobacco, such as cigarettes and e-cigarettes. If you need help quitting, ask your health care provider. ?Take steps to protect your skin from the sun. ?Try to avoid the sun between 10:00 a.m. and 4:00 p.m. This is when the UV light is the strongest. ?Use a sunscreen or sunblock with SPF 30 (sun protection factor 30) or greater. ?Apply sunscreen before you are exposed to sunlight and reapply as often as directed by the instructions on the sunscreen container. ?Always wear sunglasses that have UV protection, and always wear a hat and clothing to protect your skin from sunlight. ?When possible, avoid medicines that increase your sensitivity to sunlight. ?Do not use tanning beds or other indoor tanning devices. ?Contact a health care provider if: ?You notice any changes or new growths on your skin. ?You have swelling, pain, or more redness around your treated area. ?You have fluid or blood coming from your treated area. ?Your treated area feels warm to the touch. ?You have pus or a bad smell coming from your treated area. ?You have a fever. ?You have a blister that becomes large and painful. ?Summary ?An actinic keratosis is a precancerous growth on the skin. If there is more than one growth, the condition is called actinic keratoses. In some cases, if left untreated, these growths can develop into skin cancer. ?Check your skin regularly for any growths, especially growths that start to itch or bleed, or change in size, shape, or color. ?Take steps to protect your skin from the sun. ?Contact a health care provider if you notice any changes or new growths on your skin. ?Keep all follow-up visits as told by your health care provider. This is important. ?This information is  not intended to replace advice given to you by your health care provider. Make sure you discuss any questions you have with your health care provider. ?Document Revised: 04/06/2021 Document Reviewed: 03/17/2021 ?Elsevier Patient Education ? [place]. ? ?

## 2021-10-11 NOTE — Progress Notes (Signed)
? ?  Acute Office Visit ? ?Subjective:  ? ?  ?Patient ID: Mario Smith, male    DOB: May 23, 1941, 81 y.o.   MRN: [id number] ? ?Chief Complaint  ?Patient presents with  ? Hair/Scalp Problem  ? ? ?HPI ?Patient is in today for lesions on his scalp. He is being followed by dermatology for actinic keratosis on his scalp. He was given medication for this but stopped using it because it made his skin burn. He is not sure of what the medication. He believes he has a follow up appointment with dermatology next month. He is here today because his scalp remains itchy and he reports burning in his scalp. It has been making it difficult for him to sleep. Denies drainage or fever.  ? ?ROS ?As per HPI.  ? ?   ?Objective:  ?  ?BP 133/74 Comment: at home reading per pt  Pulse 65   Temp 98.5 ?F (36.9 ?C) (Temporal)   Ht '5\' 7"'$  (1.702 m)   Wt 169 lb 6 oz (76.8 kg)   BMI 26.53 kg/m?  ?BP Readings from Last 3 Encounters:  ?10/11/21 133/74  ?08/25/21 (!) 150/76  ?05/18/21 (!) 144/81  ? ?  ? ?Physical Exam ?Vitals and nursing note reviewed.  ?Constitutional:   ?   General: He is not in acute distress. ?   Appearance: He is not ill-appearing, toxic-appearing or diaphoretic.  ?HENT:  ?   Head: Normocephalic.  ?   Comments: Actinic keratosis present to scalp. No signs of infection.  ?Eyes:  ?   Conjunctiva/sclera: Conjunctivae normal.  ?   Pupils: Pupils are equal, round, and reactive to light.  ?Cardiovascular:  ?   Rate and Rhythm: Normal rate and regular rhythm.  ?   Heart sounds: Normal heart sounds. No murmur heard. ?Pulmonary:  ?   Effort: Pulmonary effort is normal. No respiratory distress.  ?   Breath sounds: Normal breath sounds.  ?Skin: ?   General: Skin is warm and dry.  ?Neurological:  ?   Mental Status: He is alert and oriented to person, place, and time.  ?Psychiatric:     ?   Mood and Affect: Mood normal.     ?   Behavior: Behavior normal.  ? ? ?No results found for any visits on 10/11/21. ? ? ?   ?Assessment & Plan:  ? ?Jonmichael  was seen today for hair/scalp problem. ? ?Diagnoses and all orders for this visit: ? ?Actinic keratosis ?Discussed need to keep follow up with dermatology. Can go ahead and notify derm that he was unable to tolerate the prescribed medication and see if another can be prescribed while awaiting his follow up appointment.  ? ?Primary hypertension ?Elevated today in office. Continue to monitor at home and notify for elevated reading.  ? ? ?Return if symptoms worsen or fail to improve. ? ?The patient indicates understanding of these issues and agrees with the plan. ? ?Gwenlyn Perking, FNP ? ? ?

## 2021-10-25 ENCOUNTER — Telehealth: Payer: Self-pay

## 2021-10-25 ENCOUNTER — Encounter: Payer: Self-pay | Admitting: Family Medicine

## 2021-10-25 ENCOUNTER — Ambulatory Visit (INDEPENDENT_AMBULATORY_CARE_PROVIDER_SITE_OTHER): Payer: Medicare HMO | Admitting: Family Medicine

## 2021-10-25 VITALS — BP 134/67 | HR 68 | Temp 97.9°F | Ht 67.0 in | Wt 172.0 lb

## 2021-10-25 DIAGNOSIS — S80862A Insect bite (nonvenomous), left lower leg, initial encounter: Secondary | ICD-10-CM | POA: Diagnosis not present

## 2021-10-25 DIAGNOSIS — W57XXXA Bitten or stung by nonvenomous insect and other nonvenomous arthropods, initial encounter: Secondary | ICD-10-CM | POA: Diagnosis not present

## 2021-10-25 DIAGNOSIS — L03116 Cellulitis of left lower limb: Secondary | ICD-10-CM

## 2021-10-25 MED ORDER — DOXYCYCLINE HYCLATE 100 MG PO TABS: 100.0000 mg | ORAL_TABLET | Freq: Two times a day (BID) | ORAL | 0 refills | Status: AC

## 2021-10-25 NOTE — Patient Instructions (Signed)
We talked about doxycycline as treatment for tick borne illnesses.  This is really the best antibiotic that is on the market right now for treatment of Lyme disease and other tick diseases.  I am not quite sure that you have Lyme disease but the rash certainly seems consistent with a cellulitis so regardless you need an antibiotic.  If this medication causes too much nausea even if you are eating with it, let me know and we can switch you off to amoxicillin, which is not as good but may be better tolerated for you. Candied ginger is good for nausea.  You can use ice and cold compresses on the area to help with swelling and pain.  I think that using hydrocortisone cream on that area if needed is fine as well.  That would help with itching  Make sure that you eat a full meal before you take the doxycycline.  Use sunscreen if you are going to be outside while taking the antibiotic.  If you develop any other symptoms that we discussed today please follow-up in office  Tick Bite Information, Adult  Ticks are insects that can bite. Most ticks live in shrubs and grassy areas. They climb onto people and animals that go by. Then they bite. Some ticks carry germs that can make you sick. How can I prevent tick bites? Take these steps: Use insect repellent Use an insect repellent that has 20% or higher of the ingredients DEET, picaridin, or IR3535. Follow the instructions on the label. Put it on: Bare skin. The tops of your boots. Your pant legs. The ends of your sleeves. If you use an insect repellent that has the ingredient permethrin, follow the instructions on the label. Put it on: Clothing. Boots. Supplies or outdoor gear. Tents. When you are outside Wear long sleeves and long pants. Wear light-colored clothes. Tuck your pant legs into your socks. Stay in the middle of the trail. Do not touch the bushes. Avoid walking through long grass. Check for ticks on your clothes, hair, and skin often  while you are outside. Before going inside your house, check your clothes, skin, head, neck, armpits, waist, groin, and joint areas. When you go indoors Check your clothes for ticks. Dry your clothes in a dryer on high heat for 10 minutes or more. If clothes are damp, additional time may be needed. Wash your clothes right away if they need to be washed. Use hot water. Check your pets and outdoor gear. Shower right away. Check your body for ticks. Do a full body check using a mirror. What is the right way to remove a tick? Remove the tick from your skin as soon as possible. Do not remove the tick with your bare fingers. To remove a tick that is crawling on your skin: Go outdoors and brush the tick off. Use tape or a lint roller. To remove a tick that is biting: Wash your hands. If you have latex gloves, put them on. Use tweezers, curved forceps, or a tick-removal tool to grasp the tick. Grasp the tick as close to your skin and as close to the tick's head as possible. Gently pull up until the tick lets go. Try to keep the tick's head attached to its body. Do not twist or jerk the tick. Do not squeeze or crush the tick. Do not try to remove a tick with heat, alcohol, petroleum jelly, or fingernail polish. What should I do after taking out a tick? Throw away the tick.  Do not crush a tick with your fingers. Clean the bite area and your hands with soap and water, rubbing alcohol, or an iodine wash. If an antiseptic cream or ointment is available, apply a small amount to the bite area. Wash and disinfect any instruments that you used to remove the tick. How should I get rid of a live tick? To dispose of a live tick, use one of these methods: Place the tick in rubbing alcohol. Place the tick in a bag or container you can close tightly. Wrap the tick tightly in tape. Flush the tick down the toilet. Contact a doctor if: You have symptoms, such as: A fever or chills. A red rash that makes a  circle (bull's-eye rash) in the bite area. Redness and swelling where the tick bit you. Headache. Pain in a muscle, joint, or bone. Being more tired than normal. Trouble walking or moving your legs. Numbness in your legs. Tender and swollen lymph glands. A part of a tick breaks off and gets stuck in your skin. Get help right away if: You cannot remove a tick. You cannot move (have paralysis) or feel weak. You are feeling worse or have new symptoms. You find a tick that is biting you and filled with blood. This is important if you are in an area where diseases from ticks are common. Summary Ticks may carry germs that can make you sick. To prevent tick bites wear long sleeves, long pants, and light colors. Use insect repellent. Follow the instructions on the label. If the tick is biting, do not try to remove it with heat, alcohol, petroleum jelly, or fingernail polish. Use tweezers, curved forceps, or a tick-removal tool to grasp the tick. Gently pull up until the tick lets go. Do not twist or jerk the tick. Do not squeeze or crush the tick. If you have symptoms, contact a doctor. This information is not intended to replace advice given to you by your health care provider. Make sure you discuss any questions you have with your health care provider. Document Revised: 05/20/2019 Document Reviewed: 05/20/2019 Elsevier Patient Education  [place].

## 2021-10-25 NOTE — Progress Notes (Signed)
Subjective: CC:Tick bite PCP: Dettinger, Fransisca Kaufmann, MD HPI:Mario Smith is a 81 y.o. male presenting to clinic today for:  1. Tick bite Patient reports he sustained a tick bite on Saturday.  He has been having erythema, itching and inflammation along the left lower leg since.  He denies any myalgia, arthralgia, headache, nausea, vomiting or new fatigue.  No rash elsewhere.  ROS: Per HPI  Allergies  Allergen Reactions   Simvastatin Other (See Comments)    Leg cramps   Sulfa Antibiotics     UNSPECIFIED REACTION    Codeine Nausea And Vomiting   Doxycycline Nausea And Vomiting   Hydrocodone Nausea And Vomiting   Meclizine Nausea Only   Niacin And Related Other (See Comments)    Flushing in the face and redness    Ondansetron Other (See Comments)    Tremors    Phenergan [Promethazine Hcl] Other (See Comments)    Tremors    Past Medical History:  Diagnosis Date   Abdominal pain 10/10/2017   Arthritis    Back pain    Colon polyp 2011   tubular adenoma.  no HG dysplasia.     Constipation    COPD (chronic obstructive pulmonary disease) (HCC)    no per pt   Dehydration 10/10/2017   Dyspnea    with exertion   ED (erectile dysfunction)    Essential hypertension, benign    Gallbladder sludge 10/2017   GERD (gastroesophageal reflux disease)    acute gastritis   Graves disease    History of kidney stones    HTN (hypertension)    Other and unspecified hyperlipidemia    PONV (postoperative nausea and vomiting)    Pt's wife also said that when he had a nerve block in 2015 here, pt had a reaction immediately. 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- finished medication   Vertigo     Current Outpatient Medications:    albuterol (VENTOLIN HFA) 108 (90 Base) MCG/ACT inhaler, Inhale 2 puffs into the lungs every 6 (six) hours as needed for wheezing or shortness of  breath., Disp: 18 g, Rfl: 1   aspirin 81 MG EC tablet, Take by mouth., Disp: , Rfl:    betamethasone, augmented, (DIPROLENE) 0.05 % lotion, SMARTSIG:Sparingly Topical Every Evening PRN, Disp: , Rfl:    carboxymethylcellulose (REFRESH PLUS) 0.5 % SOLN, Place 1 drop into both eyes as needed (dry eyes)., Disp: , Rfl:    Cholecalciferol (VITAMIN D3) 2000 UNITS TABS, Take 2,000 Units by mouth daily., Disp: , Rfl:    Coenzyme Q10 (CO Q 10) 100 MG CAPS, Take 100 mg by mouth daily., Disp: , Rfl:    fluticasone (FLONASE) 50 MCG/ACT nasal spray, SPRAY 2 SPRAYS INTO EACH NOSTRIL EVERY DAY, Disp: 48 mL, Rfl: 3   hydrochlorothiazide (MICROZIDE) 12.5 MG capsule, Take 2 capsules (25 mg total) by mouth daily., Disp: 180 capsule, Rfl: 3   methimazole (TAPAZOLE) 5 MG tablet, Take 1 tablet (5 mg total) by mouth 2 (two) times a week., Disp: 90 tablet, Rfl: 3   metoprolol tartrate (LOPRESSOR) 25 MG tablet, Take 1 tablet (25 mg total) by mouth 2 (two) times daily., Disp: 180 tablet, Rfl: 3   Omega-3 Fatty Acids (FISH OIL) 1000 MG CAPS, Take 1,000 mg by mouth daily., Disp: , Rfl:    pantoprazole (PROTONIX) 40 MG tablet, Take  1 tablet (40 mg total) by mouth 2 (two) times daily., Disp: 180 tablet, Rfl: 3   rosuvastatin (CRESTOR) 10 MG tablet, Take 1 tablet (10 mg total) by mouth daily., Disp: 90 tablet, Rfl: 3   tamsulosin (FLOMAX) 0.4 MG CAPS capsule, Take 1 capsule (0.4 mg total) by mouth daily., Disp: 90 capsule, Rfl: 3   telmisartan (MICARDIS) 40 MG tablet, Take 0.5 tablets (20 mg total) by mouth daily., Disp: 45 tablet, Rfl: 3  Current Facility-Administered Medications:    cyanocobalamin ((VITAMIN B-12)) injection 1,000 mcg, 1,000 mcg, Intramuscular, Q30 days, Dettinger, Fransisca Kaufmann, MD, 1,000 mcg at 10/08/21 1047 Social History   Socioeconomic History   Marital status: Married    Spouse name: [place]   Number of children: 1   Years of education: 8   Highest education level: 8th grade  Occupational History    Occupation: retired  Tobacco Use   Smoking status: Former    Types: Cigarettes    Quit date: 12/19/1992    Years since quitting: 28.8   Smokeless tobacco: Former    Types: Chew    Quit date: 12/19/1992  Vaping Use   Vaping Use: Never used  Substance and Sexual Activity   Alcohol use: Not Currently   Drug use: No   Sexual activity: Yes  Other Topics Concern   Not on file  Social History Narrative   Not on file   Social Determinants of Health   Financial Resource Strain: Not on file  Food Insecurity: Not on file  Transportation Needs: Not on file  Physical Activity: Not on file  Stress: Not on file  Social Connections: Not on file  Intimate Partner Violence: Not on file   Family History  Problem Relation Age of Onset   Heart disease Mother    COPD Mother    Heart disease Father    COPD Father    Alcohol abuse Father    Cancer Sister        colon cancer   Emphysema Sister    Colon cancer Sister    Cancer Brother        prostate   Emphysema Sister    Prostate cancer Brother    Healthy Daughter    Esophageal cancer Neg Hx    Stomach cancer Neg Hx    Rectal cancer Neg Hx     Objective: Office vital signs reviewed. BP 134/67   Pulse 68   Temp 97.9 F (36.6 C)   Ht '5\' 7"'$  (1.702 m)   Wt 172 lb (78 kg)   SpO2 96%   BMI 26.94 kg/m   Physical Examination:  General: Awake, alert, well nourished, No acute distress HEENT: sclera white, MMM Cardio: regular rate and rhythm, S1S2 heard, no murmurs appreciated Pulm: clear to auscultation bilaterally, no wheezes, rhonchi or rales; normal work of breathing on room air Skin: Very large area of erythema, warmth and induration along the medial lower leg on the left.  There is no exudates or palpable fluctuance.  Assessment/ Plan: 81 y.o. male   Tick bite of left lower leg, initial encounter - Plan: doxycycline (VIBRA-TABS) 100 MG tablet  Cellulitis of left lower extremity - Plan: doxycycline (VIBRA-TABS) 100 MG  tablet  We discussed trialing doxycycline again given that he is really just had nausea with it.  This is a normal side effect of this particular medication.  We discussed eating a full meal prior to use of the doxycycline.  I had considered giving him promethazine or  Zofran but apparently this is caused tremors in the past so is hesitant to give.  Advised use candy ginger if needed for nausea.  If nausea is severe such that he is not able to keep food or drink down or really just cannot tolerate the doxycycline he will contact me.  We can trial amoxicillin at that point but I do find this to be less useful in the setting of tickborne illnesses then doxycycline is.  Cool compresses to the affected area.  May use topical corticosteroid if needed for itching.  Red flag signs and symptoms warranting further evaluation discussed.  Patient voiced good understanding will follow-up as needed  No orders of the defined types were placed in this encounter.  No orders of the defined types were placed in this encounter.    Janora Norlander, DO [place] [phone]

## 2021-10-25 NOTE — Telephone Encounter (Signed)
Pharmacy needed ok to give doxycycline since it is on his allergy lost, I have verbal ok bceause it causes nausea

## 2021-10-28 DIAGNOSIS — L578 Other skin changes due to chronic exposure to nonionizing radiation: Secondary | ICD-10-CM | POA: Diagnosis not present

## 2021-11-09 ENCOUNTER — Ambulatory Visit (INDEPENDENT_AMBULATORY_CARE_PROVIDER_SITE_OTHER): Payer: Medicare HMO | Admitting: *Deleted

## 2021-11-09 DIAGNOSIS — E538 Deficiency of other specified B group vitamins: Secondary | ICD-10-CM

## 2021-12-09 ENCOUNTER — Ambulatory Visit (INDEPENDENT_AMBULATORY_CARE_PROVIDER_SITE_OTHER): Payer: Medicare HMO | Admitting: Emergency Medicine

## 2021-12-09 DIAGNOSIS — E538 Deficiency of other specified B group vitamins: Secondary | ICD-10-CM

## 2021-12-21 ENCOUNTER — Other Ambulatory Visit: Payer: Medicare HMO

## 2021-12-21 ENCOUNTER — Other Ambulatory Visit: Payer: Self-pay | Admitting: Emergency Medicine

## 2021-12-21 DIAGNOSIS — E7800 Pure hypercholesterolemia, unspecified: Secondary | ICD-10-CM

## 2021-12-21 DIAGNOSIS — E0590 Thyrotoxicosis, unspecified without thyrotoxic crisis or storm: Secondary | ICD-10-CM | POA: Diagnosis not present

## 2021-12-21 DIAGNOSIS — I10 Essential (primary) hypertension: Secondary | ICD-10-CM

## 2021-12-22 LAB — COMPREHENSIVE METABOLIC PANEL
ALT: 19 IU/L (ref 0–44)
AST: 20 IU/L (ref 0–40)
Albumin/Globulin Ratio: 2 (ref 1.2–2.2)
Albumin: 4.4 g/dL (ref 3.7–4.7)
Alkaline Phosphatase: 73 IU/L (ref 44–121)
BUN/Creatinine Ratio: 13 (ref 10–24)
BUN: 17 mg/dL (ref 8–27)
Bilirubin Total: 0.7 mg/dL (ref 0.0–1.2)
CO2: 26 mmol/L (ref 20–29)
Calcium: 9.4 mg/dL (ref 8.6–10.2)
Chloride: 107 mmol/L — ABNORMAL HIGH (ref 96–106)
Creatinine, Ser: 1.28 mg/dL — ABNORMAL HIGH (ref 0.76–1.27)
Globulin, Total: 2.2 g/dL (ref 1.5–4.5)
Glucose: 105 mg/dL — ABNORMAL HIGH (ref 70–99)
Potassium: 4.5 mmol/L (ref 3.5–5.2)
Sodium: 143 mmol/L (ref 134–144)
Total Protein: 6.6 g/dL (ref 6.0–8.5)
eGFR: 56 mL/min/{1.73_m2} — ABNORMAL LOW (ref >59)

## 2021-12-22 LAB — CBC WITH DIFFERENTIAL/PLATELET
Basophils Absolute: 0.1 10*3/uL (ref 0.0–0.2)
Basos: 1 %
EOS (ABSOLUTE): 0.2 10*3/uL (ref 0.0–0.4)
Eos: 3 %
Hematocrit: 46.6 % (ref 37.5–51.0)
Hemoglobin: 16.1 g/dL (ref 13.0–17.7)
Immature Grans (Abs): 0 10*3/uL (ref 0.0–0.1)
Immature Granulocytes: 0 %
Lymphocytes Absolute: 1.7 10*3/uL (ref 0.7–3.1)
Lymphs: 32 %
MCH: 31.8 pg (ref 26.6–33.0)
MCHC: 34.5 g/dL (ref 31.5–35.7)
MCV: 92 fL (ref 79–97)
Monocytes Absolute: 0.6 10*3/uL (ref 0.1–0.9)
Monocytes: 11 %
Neutrophils Absolute: 2.8 10*3/uL (ref 1.4–7.0)
Neutrophils: 53 %
Platelets: 127 10*3/uL — ABNORMAL LOW (ref 150–450)
RBC: 5.07 x10E6/uL (ref 4.14–5.80)
RDW: 13 % (ref 11.6–15.4)
WBC: 5.3 10*3/uL (ref 3.4–10.8)

## 2021-12-22 LAB — LIPID PANEL
Chol/HDL Ratio: 3.2 ratio (ref 0.0–5.0)
Cholesterol, Total: 131 mg/dL (ref 100–199)
HDL: 41 mg/dL (ref >39)
LDL Chol Calc (NIH): 73 mg/dL (ref 0–99)
Triglycerides: 91 mg/dL (ref 0–149)
VLDL Cholesterol Cal: 17 mg/dL (ref 5–40)

## 2021-12-22 LAB — TSH: TSH: 2.99 u[IU]/mL (ref 0.450–4.500)

## 2021-12-24 ENCOUNTER — Ambulatory Visit (INDEPENDENT_AMBULATORY_CARE_PROVIDER_SITE_OTHER): Payer: Medicare HMO | Admitting: Family Medicine

## 2021-12-24 ENCOUNTER — Encounter: Payer: Self-pay | Admitting: Family Medicine

## 2021-12-24 VITALS — BP 145/66 | HR 62 | Temp 97.9°F | Ht 67.0 in | Wt 169.0 lb

## 2021-12-24 DIAGNOSIS — E0590 Thyrotoxicosis, unspecified without thyrotoxic crisis or storm: Secondary | ICD-10-CM

## 2021-12-24 DIAGNOSIS — M479 Spondylosis, unspecified: Secondary | ICD-10-CM

## 2021-12-24 DIAGNOSIS — E7800 Pure hypercholesterolemia, unspecified: Secondary | ICD-10-CM

## 2021-12-24 DIAGNOSIS — I10 Essential (primary) hypertension: Secondary | ICD-10-CM

## 2021-12-24 MED ORDER — METOPROLOL TARTRATE 25 MG PO TABS: 25.0000 mg | ORAL_TABLET | Freq: Two times a day (BID) | ORAL | 3 refills | Status: DC

## 2021-12-24 MED ORDER — ROSUVASTATIN CALCIUM 10 MG PO TABS: 10.0000 mg | ORAL_TABLET | Freq: Every day | ORAL | 3 refills | Status: DC

## 2021-12-24 MED ORDER — METHIMAZOLE 5 MG PO TABS: 5.0000 mg | ORAL_TABLET | ORAL | 3 refills | Status: DC

## 2021-12-24 MED ORDER — DICLOFENAC SODIUM 1 % EX GEL: 2.0000 g | Freq: Four times a day (QID) | CUTANEOUS | 3 refills | Status: DC

## 2021-12-24 MED ORDER — PANTOPRAZOLE SODIUM 40 MG PO TBEC: 40.0000 mg | DELAYED_RELEASE_TABLET | Freq: Two times a day (BID) | ORAL | 3 refills | Status: DC

## 2021-12-24 MED ORDER — TELMISARTAN 40 MG PO TABS: 20.0000 mg | ORAL_TABLET | Freq: Every day | ORAL | 3 refills | Status: DC

## 2021-12-24 MED ORDER — HYDROCHLOROTHIAZIDE 12.5 MG PO CAPS: 25.0000 mg | ORAL_CAPSULE | Freq: Every day | ORAL | 3 refills | Status: DC

## 2021-12-24 MED ORDER — TAMSULOSIN HCL 0.4 MG PO CAPS: 0.4000 mg | ORAL_CAPSULE | Freq: Every day | ORAL | 3 refills | Status: DC

## 2021-12-24 NOTE — Progress Notes (Signed)
BP (!) 145/66   Pulse 62   Temp 97.9 F (36.6 C)   Ht $R'5\' 7"'jP$  (1.702 m)   Wt 169 lb (76.7 kg)   SpO2 97%   BMI 26.47 kg/m    Subjective:   Patient ID: Mario Smith, male    DOB: April 15, 1941, 81 y.o.   MRN: [id number]  HPI: Mario Smith is a 81 y.o. male presenting on 12/24/2021 for Medical Management of Chronic Issues, Hypothyroidism, and Hypertension   HPI Hypertension Patient is currently on metoprolol and telmisartan and hydrochlorothiazide, and their blood pressure today is 145/66. Patient denies any lightheadedness or dizziness. Patient denies headaches, blurred vision, chest pains, shortness of breath, or weakness. Denies any side effects from medication and is content with current medication.   Hyperlipidemia Patient is coming in for recheck of his hyperlipidemia. The patient is currently taking fish oils and Crestor. They deny any issues with myalgias or history of liver damage from it. They deny any focal numbness or weakness or chest pain.   Hyperthyroidism recheck Patient is coming in for thyroid recheck today as well. They deny any issues with hair changes or heat or cold problems or diarrhea or constipation. They deny any chest pain or palpitations. They are currently on methimazole 5 mg  Patient has mid and low back pain that hurts sometimes and been more over the past week.  He denies any specific trauma that he had but just that is been bothering him some.  He denies any numbness or weakness and just says it hurts more when he twists or moves in certain direction but otherwise does not bother him.  He did try some Tylenol but did not seem to help.  Relevant past medical, surgical, family and social history reviewed and updated as indicated. Interim medical history since our last visit reviewed. Allergies and medications reviewed and updated.  Review of Systems  Constitutional:  Negative for chills and fever.  Eyes:  Negative for visual disturbance.  Respiratory:   Negative for shortness of breath and wheezing.   Cardiovascular:  Negative for chest pain and leg swelling.  Musculoskeletal:  Negative for back pain and gait problem.  Skin:  Negative for rash.  Neurological:  Negative for dizziness, weakness and light-headedness.  All other systems reviewed and are negative.   Per HPI unless specifically indicated above   Allergies as of 12/24/2021       Reactions   Simvastatin Other (See Comments)   Leg cramps   Sulfa Antibiotics    UNSPECIFIED REACTION    Codeine Nausea And Vomiting   Doxycycline Nausea And Vomiting   Hydrocodone Nausea And Vomiting   Meclizine Nausea Only   Niacin And Related Other (See Comments)   Flushing in the face and redness    Ondansetron Other (See Comments)   Tremors   Phenergan [promethazine Hcl] Other (See Comments)   Tremors        Medication List        Accurate as of December 24, 2021 11:54 AM. If you have any questions, ask your nurse or doctor.          albuterol 108 (90 Base) MCG/ACT inhaler Commonly known as: VENTOLIN HFA Inhale 2 puffs into the lungs every 6 (six) hours as needed for wheezing or shortness of breath.   aspirin EC 81 MG tablet Take by mouth.   betamethasone (augmented) 0.05 % lotion Commonly known as: DIPROLENE SMARTSIG:Sparingly Topical Every Evening PRN   carboxymethylcellulose 0.5 %  Soln Commonly known as: REFRESH PLUS Place 1 drop into both eyes as needed (dry eyes).   Co Q 10 100 MG Caps Take 100 mg by mouth daily.   diclofenac Sodium 1 % Gel Commonly known as: Voltaren Apply 2 g topically 4 (four) times daily. Started by: Fransisca Kaufmann Kaleb Sek, MD   Fish Oil 1000 MG Caps Take 1,000 mg by mouth daily.   fluticasone 50 MCG/ACT nasal spray Commonly known as: FLONASE SPRAY 2 SPRAYS INTO EACH NOSTRIL EVERY DAY   hydrochlorothiazide 12.5 MG capsule Commonly known as: MICROZIDE Take 2 capsules (25 mg total) by mouth daily.   methimazole 5 MG tablet Commonly  known as: TAPAZOLE Take 1 tablet (5 mg total) by mouth 2 (two) times a week. Start taking on: December 27, 2021   metoprolol tartrate 25 MG tablet Commonly known as: LOPRESSOR Take 1 tablet (25 mg total) by mouth 2 (two) times daily.   pantoprazole 40 MG tablet Commonly known as: PROTONIX Take 1 tablet (40 mg total) by mouth 2 (two) times daily.   rosuvastatin 10 MG tablet Commonly known as: CRESTOR Take 1 tablet (10 mg total) by mouth daily.   tamsulosin 0.4 MG Caps capsule Commonly known as: FLOMAX Take 1 capsule (0.4 mg total) by mouth daily.   telmisartan 40 MG tablet Commonly known as: MICARDIS Take 0.5 tablets (20 mg total) by mouth daily.   Vitamin D3 50 MCG (2000 UT) Tabs Take 2,000 Units by mouth daily.         Objective:   BP (!) 145/66   Pulse 62   Temp 97.9 F (36.6 C)   Ht $R'5\' 7"'vS$  (1.702 m)   Wt 169 lb (76.7 kg)   SpO2 97%   BMI 26.47 kg/m   Wt Readings from Last 3 Encounters:  12/24/21 169 lb (76.7 kg)  10/25/21 172 lb (78 kg)  10/11/21 169 lb 6 oz (76.8 kg)    Physical Exam Vitals and nursing note reviewed.  Constitutional:      General: He is not in acute distress.    Appearance: He is well-developed. He is not diaphoretic.  Eyes:     General: No scleral icterus.    Conjunctiva/sclera: Conjunctivae normal.  Neck:     Thyroid: No thyromegaly.  Cardiovascular:     Rate and Rhythm: Normal rate and regular rhythm.     Heart sounds: Normal heart sounds. No murmur heard. Pulmonary:     Effort: Pulmonary effort is normal. No respiratory distress.     Breath sounds: Normal breath sounds. No wheezing.  Musculoskeletal:        General: No swelling. Normal range of motion.     Cervical back: Neck supple.  Lymphadenopathy:     Cervical: No cervical adenopathy.  Skin:    General: Skin is warm and dry.     Findings: No rash.  Neurological:     Mental Status: He is alert and oriented to person, place, and time.     Coordination: Coordination  normal.  Psychiatric:        Behavior: Behavior normal.     Results for orders placed or performed in visit on 12/21/21  Lipid panel  Result Value Ref Range   Cholesterol, Total 131 100 - 199 mg/dL   Triglycerides 91 0 - 149 mg/dL   HDL 41 >39 mg/dL   VLDL Cholesterol Cal 17 5 - 40 mg/dL   LDL Chol Calc (NIH) 73 0 - 99 mg/dL   Chol/HDL Ratio  3.2 0.0 - 5.0 ratio  Comprehensive metabolic panel  Result Value Ref Range   Glucose 105 (H) 70 - 99 mg/dL   BUN 17 8 - 27 mg/dL   Creatinine, Ser 1.28 (H) 0.76 - 1.27 mg/dL   eGFR 56 (L) >59 mL/min/1.73   BUN/Creatinine Ratio 13 10 - 24   Sodium 143 134 - 144 mmol/L   Potassium 4.5 3.5 - 5.2 mmol/L   Chloride 107 (H) 96 - 106 mmol/L   CO2 26 20 - 29 mmol/L   Calcium 9.4 8.6 - 10.2 mg/dL   Total Protein 6.6 6.0 - 8.5 g/dL   Albumin 4.4 3.7 - 4.7 g/dL   Globulin, Total 2.2 1.5 - 4.5 g/dL   Albumin/Globulin Ratio 2.0 1.2 - 2.2   Bilirubin Total 0.7 0.0 - 1.2 mg/dL   Alkaline Phosphatase 73 44 - 121 IU/L   AST 20 0 - 40 IU/L   ALT 19 0 - 44 IU/L  CBC with Differential/Platelet  Result Value Ref Range   WBC 5.3 3.4 - 10.8 x10E3/uL   RBC 5.07 4.14 - 5.80 x10E6/uL   Hemoglobin 16.1 13.0 - 17.7 g/dL   Hematocrit 46.6 37.5 - 51.0 %   MCV 92 79 - 97 fL   MCH 31.8 26.6 - 33.0 pg   MCHC 34.5 31.5 - 35.7 g/dL   RDW 13.0 11.6 - 15.4 %   Platelets 127 (L) 150 - 450 x10E3/uL   Neutrophils 53 Not Estab. %   Lymphs 32 Not Estab. %   Monocytes 11 Not Estab. %   Eos 3 Not Estab. %   Basos 1 Not Estab. %   Neutrophils Absolute 2.8 1.4 - 7.0 x10E3/uL   Lymphocytes Absolute 1.7 0.7 - 3.1 x10E3/uL   Monocytes Absolute 0.6 0.1 - 0.9 x10E3/uL   EOS (ABSOLUTE) 0.2 0.0 - 0.4 x10E3/uL   Basophils Absolute 0.1 0.0 - 0.2 x10E3/uL   Immature Granulocytes 0 Not Estab. 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  Lab results look good for the most part, recommended hydration and will monitor platelets. Follow up plan: Return in about 4 months (around 04/26/2022), or if symptoms worsen or fail to improve, for Hypertension cholesterol .  Counseling provided for all of the vaccine components No orders of the defined types were placed in this encounter.   Caryl Pina, MD [facility] 12/24/2021, 11:54  AM     

## 2021-12-27 ENCOUNTER — Telehealth: Payer: Self-pay | Admitting: Family Medicine

## 2021-12-27 NOTE — Telephone Encounter (Signed)
Spoke with wife and she advised that husbands BP is low at home and she is worried about him taking an additional medication. She states that she has BP readings from home and she would call us back with some of those numbers so we can decide if he needs this med or not. Patients wife states that she will call us back today with readings.

## 2021-12-28 ENCOUNTER — Telehealth: Payer: Self-pay | Admitting: Family Medicine

## 2021-12-28 NOTE — Telephone Encounter (Signed)
Patient was seen by Dr. Warrick Parisian on 12/24/21.

## 2021-12-28 NOTE — Telephone Encounter (Signed)
Have patient hold new blood pressure medication for 24 hours and follow up with PCP. Continue to monitor blood pressure. Until reassessed in clinic or over the phone.

## 2021-12-29 NOTE — Telephone Encounter (Signed)
Wife and pt informed of Je's recommendations. Spoke in length about BP highs and lows. Pt will continue to take Metoprolol BID and Telmisartan 1/2 tab daily until 8/2 appt with Dettinger. HCTZ will be held. Pt will take BP twice daily. Pt and wife advised to call if they have any further concerns.

## 2022-01-05 ENCOUNTER — Ambulatory Visit (INDEPENDENT_AMBULATORY_CARE_PROVIDER_SITE_OTHER): Payer: Medicare HMO | Admitting: Family Medicine

## 2022-01-05 ENCOUNTER — Encounter: Payer: Self-pay | Admitting: Family Medicine

## 2022-01-05 VITALS — BP 142/84 | HR 84 | Temp 97.6°F | Ht 67.0 in | Wt 172.0 lb

## 2022-01-05 DIAGNOSIS — K219 Gastro-esophageal reflux disease without esophagitis: Secondary | ICD-10-CM | POA: Diagnosis not present

## 2022-01-05 DIAGNOSIS — E0590 Thyrotoxicosis, unspecified without thyrotoxic crisis or storm: Secondary | ICD-10-CM | POA: Diagnosis not present

## 2022-01-05 DIAGNOSIS — E7800 Pure hypercholesterolemia, unspecified: Secondary | ICD-10-CM | POA: Diagnosis not present

## 2022-01-05 DIAGNOSIS — I10 Essential (primary) hypertension: Secondary | ICD-10-CM

## 2022-01-05 NOTE — Progress Notes (Signed)
BP (!) 142/84   Pulse 84   Temp 97.6 F (36.4 C)   Ht _0  (1.702 m)   Wt 172 lb (78 kg)   SpO2 97%   BMI 26.94 kg/m    Subjective:   Patient ID: Mario Smith, male    DOB: [birth year], 81 y.o.   MRN: [id number]  HPI: Mario Smith is a 81 y.o. male presenting on 01/05/2022 for No chief complaint on file.   HPI Hypertension Patient is currently on metoprolol and telmisartan, his blood pressures are running typically in the 130s at home., and their blood pressure today is 142/84. Patient denies any lightheadedness or dizziness. Patient denies headaches, blurred vision, chest pains, shortness of breath, or weakness. Denies any side effects from medication and is content with current medication.   Hyperlipidemia Patient is coming in for recheck of his hyperlipidemia. The patient is currently taking Crestor and fish oil. They deny any issues with myalgias or history of liver damage from it. They deny any focal numbness or weakness or chest pain.   Hyperthyroidism recheck Patient is coming in for thyroid recheck today as well. They deny any issues with hair changes or heat or cold problems or diarrhea or constipation. They deny any chest pain or palpitations. They are currently on methimazole  Relevant past medical, surgical, family and social history reviewed and updated as indicated. Interim medical history since our last visit reviewed. Allergies and medications reviewed and updated.  Review of Systems  Constitutional:  Negative for chills and fever.  Eyes:  Negative for visual disturbance.  Respiratory:  Negative for shortness of breath and wheezing.   Cardiovascular:  Negative for chest pain and leg swelling.  Musculoskeletal:  Negative for back pain and gait problem.  Skin:  Negative for rash.  Neurological:  Negative for dizziness, weakness and light-headedness.  All other systems reviewed and are negative.   Per HPI unless specifically indicated above   Allergies as  of 01/05/2022       Reactions   Simvastatin Other (See Comments)   Leg cramps   Sulfa Antibiotics    UNSPECIFIED REACTION    Codeine Nausea And Vomiting   Doxycycline Nausea And Vomiting   Hydrocodone Nausea And Vomiting   Meclizine Nausea Only   Niacin And Related Other (See Comments)   Flushing in the face and redness    Ondansetron Other (See Comments)   Tremors   Phenergan [promethazine Hcl] Other (See Comments)   Tremors        Medication List        Accurate as of January 05, 2022  9:49 AM. If you have any questions, ask your nurse or doctor.          STOP taking these medications    hydrochlorothiazide 12.5 MG capsule Commonly known as: MICROZIDE Stopped by: Fransisca Kaufmann Anyla Israelson, MD       TAKE these medications    albuterol 108 (90 Base) MCG/ACT inhaler Commonly known as: VENTOLIN HFA Inhale 2 puffs into the lungs every 6 (six) hours as needed for wheezing or shortness of breath.   aspirin EC 81 MG tablet Take by mouth.   betamethasone (augmented) 0.05 % lotion Commonly known as: DIPROLENE SMARTSIG:Sparingly Topical Every Evening PRN   carboxymethylcellulose 0.5 % Soln Commonly known as: REFRESH PLUS Place 1 drop into both eyes as needed (dry eyes).   Co Q 10 100 MG Caps Take 100 mg by mouth daily.   diclofenac Sodium 1 %  Gel Commonly known as: Voltaren Apply 2 g topically 4 (four) times daily.   Fish Oil 1000 MG Caps Take 1,000 mg by mouth daily.   fluticasone 50 MCG/ACT nasal spray Commonly known as: FLONASE SPRAY 2 SPRAYS INTO EACH NOSTRIL EVERY DAY   methimazole 5 MG tablet Commonly known as: TAPAZOLE Take 1 tablet (5 mg total) by mouth 2 (two) times a week.   metoprolol tartrate 25 MG tablet Commonly known as: LOPRESSOR Take 1 tablet (25 mg total) by mouth 2 (two) times daily.   pantoprazole 40 MG tablet Commonly known as: PROTONIX Take 1 tablet (40 mg total) by mouth 2 (two) times daily.   rosuvastatin 10 MG tablet Commonly  known as: CRESTOR Take 1 tablet (10 mg total) by mouth daily.   tamsulosin 0.4 MG Caps capsule Commonly known as: FLOMAX Take 1 capsule (0.4 mg total) by mouth daily.   telmisartan 40 MG tablet Commonly known as: MICARDIS Take 0.5 tablets (20 mg total) by mouth daily.   Vitamin D3 50 MCG (2000 UT) Tabs Take 2,000 Units by mouth daily.         Objective:   BP (!) 142/84   Pulse 84   Temp 97.6 F (36.4 C)   Ht _0  (1.702 m)   Wt 172 lb (78 kg)   SpO2 97%   BMI 26.94 kg/m   Wt Readings from Last 3 Encounters:  01/05/22 172 lb (78 kg)  12/24/21 169 lb (76.7 kg)  10/25/21 172 lb (78 kg)    Physical Exam Vitals and nursing note reviewed.  Constitutional:      General: He is not in acute distress.    Appearance: He is well-developed. He is not diaphoretic.  Eyes:     General: No scleral icterus.    Conjunctiva/sclera: Conjunctivae normal.  Neck:     Thyroid: No thyromegaly.  Cardiovascular:     Rate and Rhythm: Normal rate and regular rhythm.     Heart sounds: Normal heart sounds. No murmur heard. Pulmonary:     Effort: Pulmonary effort is normal. No respiratory distress.     Breath sounds: Normal breath sounds. No wheezing.  Musculoskeletal:        General: No swelling. Normal range of motion.     Cervical back: Neck supple.  Lymphadenopathy:     Cervical: No cervical adenopathy.  Skin:    General: Skin is warm and dry.     Findings: No rash.  Neurological:     Mental Status: He is alert and oriented to person, place, and time.     Coordination: Coordination normal.  Psychiatric:        Behavior: Behavior normal.     Results for orders placed or performed in visit on 12/21/21  Lipid panel  Result Value Ref Range   Cholesterol, Total 131 100 - 199 mg/dL   Triglycerides 91 0 - 149 mg/dL   HDL 41 >39 mg/dL   VLDL Cholesterol Cal 17 5 - 40 mg/dL   LDL Chol Calc (NIH) 73 0 - 99 mg/dL   Chol/HDL Ratio 3.2 0.0 - 5.0 ratio  Comprehensive metabolic panel   Result Value Ref Range   Glucose 105 (H) 70 - 99 mg/dL   BUN 17 8 - 27 mg/dL   Creatinine, Ser 1.28 (H) 0.76 - 1.27 mg/dL   eGFR 56 (L) >59 mL/min/1.73   BUN/Creatinine Ratio 13 10 - 24   Sodium 143 134 - 144 mmol/L   Potassium 4.5 3.5 -  5.2 mmol/L   Chloride 107 (H) 96 - 106 mmol/L   CO2 26 20 - 29 mmol/L   Calcium 9.4 8.6 - 10.2 mg/dL   Total Protein 6.6 6.0 - 8.5 g/dL   Albumin 4.4 3.7 - 4.7 g/dL   Globulin, Total 2.2 1.5 - 4.5 g/dL   Albumin/Globulin Ratio 2.0 1.2 - 2.2   Bilirubin Total 0.7 0.0 - 1.2 mg/dL   Alkaline Phosphatase 73 44 - 121 IU/L   AST 20 0 - 40 IU/L   ALT 19 0 - 44 IU/L  CBC with Differential/Platelet  Result Value Ref Range   WBC 5.3 3.4 - 10.8 x10E3/uL   RBC 5.07 4.14 - 5.80 x10E6/uL   Hemoglobin 16.1 13.0 - 17.7 g/dL   Hematocrit 46.6 37.5 - 51.0 %   MCV 92 79 - 97 fL   MCH 31.8 26.6 - 33.0 pg   MCHC 34.5 31.5 - 35.7 g/dL   RDW 13.0 11.6 - 15.4 %   Platelets 127 (L) 150 - 450 x10E3/uL   Neutrophils 53 Not Estab. %   Lymphs 32 Not Estab. %   Monocytes 11 Not Estab. %   Eos 3 Not Estab. %   Basos 1 Not Estab. %   Neutrophils Absolute 2.8 1.4 - 7.0 x10E3/uL   Lymphocytes Absolute 1.7 0.7 - 3.1 x10E3/uL   Monocytes Absolute 0.6 0.1 - 0.9 x10E3/uL   EOS (ABSOLUTE) 0.2 0.0 - 0.4 x10E3/uL   Basophils Absolute 0.1 0.0 - 0.2 x10E3/uL   Immature Granulocytes 0 Not Estab. %   Immature Grans (Abs) 0.0 0.0 - 0.1 x10E3/uL  TSH  Result Value Ref Range   TSH 2.990 0.450 - 4.500 uIU/mL    Assessment & Plan:   Problem List Items Addressed This Visit       Cardiovascular and Mediastinum   Hypertension - Primary     Digestive   Gastroesophageal reflux disease     Endocrine   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     Other   Hyperlipidemia    Patient has not been taking the hydrochlorothiazide and his blood pressures been running good so we will take it off of his list.  No change in medication, seems to be doing well.  He just restarted the tamsulosin  and he will see if it helps with his urinary dribbling. Follow up plan: Return in about 6 months (around 07/08/2022), or if symptoms worsen or fail to improve, for Thyroid and hyperlipidemia and hypertension.  Counseling provided for all of the vaccine components No orders of the defined types were placed in this encounter.   Caryl Pina, MD [facility] 01/05/2022, 9:49 AM

## 2022-01-10 ENCOUNTER — Ambulatory Visit (INDEPENDENT_AMBULATORY_CARE_PROVIDER_SITE_OTHER): Payer: Medicare HMO

## 2022-01-10 DIAGNOSIS — E538 Deficiency of other specified B group vitamins: Secondary | ICD-10-CM

## 2022-01-10 NOTE — Progress Notes (Signed)
Cyanocobalamin injection given to left deltoid.  Patient tolerated well. 

## 2022-01-12 ENCOUNTER — Other Ambulatory Visit: Payer: Self-pay | Admitting: Family Medicine

## 2022-01-12 DIAGNOSIS — E0590 Thyrotoxicosis, unspecified without thyrotoxic crisis or storm: Secondary | ICD-10-CM

## 2022-01-13 DIAGNOSIS — D492 Neoplasm of unspecified behavior of bone, soft tissue, and skin: Secondary | ICD-10-CM | POA: Diagnosis not present

## 2022-01-13 DIAGNOSIS — D225 Melanocytic nevi of trunk: Secondary | ICD-10-CM | POA: Diagnosis not present

## 2022-01-13 DIAGNOSIS — C44519 Basal cell carcinoma of skin of other part of trunk: Secondary | ICD-10-CM | POA: Diagnosis not present

## 2022-01-13 DIAGNOSIS — L821 Other seborrheic keratosis: Secondary | ICD-10-CM | POA: Diagnosis not present

## 2022-01-13 DIAGNOSIS — L578 Other skin changes due to chronic exposure to nonionizing radiation: Secondary | ICD-10-CM | POA: Diagnosis not present

## 2022-01-13 DIAGNOSIS — L570 Actinic keratosis: Secondary | ICD-10-CM | POA: Diagnosis not present

## 2022-01-14 ENCOUNTER — Ambulatory Visit (INDEPENDENT_AMBULATORY_CARE_PROVIDER_SITE_OTHER): Payer: Medicare HMO

## 2022-01-14 DIAGNOSIS — Z23 Encounter for immunization: Secondary | ICD-10-CM

## 2022-02-10 ENCOUNTER — Ambulatory Visit (INDEPENDENT_AMBULATORY_CARE_PROVIDER_SITE_OTHER): Payer: Medicare HMO | Admitting: *Deleted

## 2022-02-10 DIAGNOSIS — E538 Deficiency of other specified B group vitamins: Secondary | ICD-10-CM

## 2022-03-03 DIAGNOSIS — L905 Scar conditions and fibrosis of skin: Secondary | ICD-10-CM | POA: Diagnosis not present

## 2022-03-03 DIAGNOSIS — C44519 Basal cell carcinoma of skin of other part of trunk: Secondary | ICD-10-CM | POA: Diagnosis not present

## 2022-03-14 ENCOUNTER — Ambulatory Visit (INDEPENDENT_AMBULATORY_CARE_PROVIDER_SITE_OTHER): Payer: Medicare HMO | Admitting: *Deleted

## 2022-03-14 DIAGNOSIS — E538 Deficiency of other specified B group vitamins: Secondary | ICD-10-CM | POA: Diagnosis not present

## 2022-03-14 DIAGNOSIS — Z23 Encounter for immunization: Secondary | ICD-10-CM

## 2022-03-14 NOTE — Progress Notes (Signed)
Pt in today for B12, received flu vaccine too. Pt tolerated well.

## 2022-03-22 ENCOUNTER — Ambulatory Visit: Payer: Medicare HMO

## 2022-04-01 ENCOUNTER — Ambulatory Visit: Payer: Medicare HMO

## 2022-04-15 ENCOUNTER — Ambulatory Visit (INDEPENDENT_AMBULATORY_CARE_PROVIDER_SITE_OTHER): Payer: Medicare HMO

## 2022-04-15 DIAGNOSIS — E538 Deficiency of other specified B group vitamins: Secondary | ICD-10-CM

## 2022-04-15 NOTE — Progress Notes (Signed)
Cyanocobalamin injection given to left deltoid.  Patient tolerated well. 

## 2022-04-27 ENCOUNTER — Ambulatory Visit (INDEPENDENT_AMBULATORY_CARE_PROVIDER_SITE_OTHER): Payer: Medicare HMO | Admitting: Family Medicine

## 2022-04-27 ENCOUNTER — Encounter: Payer: Self-pay | Admitting: Family Medicine

## 2022-04-27 VITALS — BP 133/71 | HR 72 | Ht 67.0 in | Wt 168.0 lb

## 2022-04-27 DIAGNOSIS — E7800 Pure hypercholesterolemia, unspecified: Secondary | ICD-10-CM | POA: Diagnosis not present

## 2022-04-27 DIAGNOSIS — E0590 Thyrotoxicosis, unspecified without thyrotoxic crisis or storm: Secondary | ICD-10-CM | POA: Diagnosis not present

## 2022-04-27 DIAGNOSIS — I10 Essential (primary) hypertension: Secondary | ICD-10-CM

## 2022-04-27 DIAGNOSIS — E0500 Thyrotoxicosis with diffuse goiter without thyrotoxic crisis or storm: Secondary | ICD-10-CM | POA: Diagnosis not present

## 2022-04-27 NOTE — Progress Notes (Signed)
BP 133/71   Pulse 72   Ht _0  (1.702 m)   Wt 168 lb (76.2 kg)   SpO2 97%   BMI 26.31 kg/m    Subjective:   Patient ID: Mario Smith, male    DOB: 12-03-1940, 81 y.o.   MRN: [id number]  HPI: Mario Smith is a 81 y.o. male presenting on 04/27/2022 for Medical Management of Chronic Issues, Hypertension, and Hypothyroidism   HPI Hypertension Patient is currently on metoprolol and telmisartan, and their blood pressure today is 133/71. Patient denies any lightheadedness or dizziness. Patient denies headaches, blurred vision, chest pains, shortness of breath, or weakness. Denies any side effects from medication and is content with current medication.  Hyperlipidemia Patient is coming in for recheck of his hyperlipidemia. The patient is currently taking fish oils and Crestor. They deny any issues with myalgias or history of liver damage from it. They deny any focal numbness or weakness or chest pain.   Hyperthyroidism recheck Patient is coming in for thyroid recheck today as well. They deny any issues with hair changes or heat or cold problems or diarrhea or constipation. They deny any chest pain or palpitations. They are currently on methimazole  Relevant past medical, surgical, family and social history reviewed and updated as indicated. Interim medical history since our last visit reviewed. Allergies and medications reviewed and updated.  Review of Systems  Constitutional:  Negative for chills and fever.  Eyes:  Positive for visual disturbance.  Respiratory:  Negative for shortness of breath and wheezing.   Cardiovascular:  Negative for chest pain and leg swelling.  Musculoskeletal:  Negative for back pain and gait problem.  Skin:  Negative for rash.  Neurological:  Positive for headaches. Negative for dizziness and weakness.  All other systems reviewed and are negative.   Per HPI unless specifically indicated above   Allergies as of 04/27/2022       Reactions    Simvastatin Other (See Comments)   Leg cramps   Sulfa Antibiotics    UNSPECIFIED REACTION    Codeine Nausea And Vomiting   Doxycycline Nausea And Vomiting   Hydrocodone Nausea And Vomiting   Meclizine Nausea Only   Niacin And Related Other (See Comments)   Flushing in the face and redness    Ondansetron Other (See Comments)   Tremors   Phenergan [promethazine Hcl] Other (See Comments)   Tremors        Medication List        Accurate as of April 27, 2022 10:29 AM. If you have any questions, ask your nurse or doctor.          albuterol 108 (90 Base) MCG/ACT inhaler Commonly known as: VENTOLIN HFA Inhale 2 puffs into the lungs every 6 (six) hours as needed for wheezing or shortness of breath.   aspirin EC 81 MG tablet Take by mouth.   betamethasone (augmented) 0.05 % lotion Commonly known as: DIPROLENE SMARTSIG:Sparingly Topical Every Evening PRN   carboxymethylcellulose 0.5 % Soln Commonly known as: REFRESH PLUS Place 1 drop into both eyes as needed (dry eyes).   Co Q 10 100 MG Caps Take 100 mg by mouth daily.   diclofenac Sodium 1 % Gel Commonly known as: Voltaren Apply 2 g topically 4 (four) times daily.   Fish Oil 1000 MG Caps Take 1,000 mg by mouth daily.   fluticasone 50 MCG/ACT nasal spray Commonly known as: FLONASE SPRAY 2 SPRAYS INTO EACH NOSTRIL EVERY DAY   methimazole  5 MG tablet Commonly known as: TAPAZOLE Take 1 tablet (5 mg total) by mouth 2 (two) times a week.   metoprolol tartrate 25 MG tablet Commonly known as: LOPRESSOR Take 1 tablet (25 mg total) by mouth 2 (two) times daily.   pantoprazole 40 MG tablet Commonly known as: PROTONIX Take 1 tablet (40 mg total) by mouth 2 (two) times daily.   rosuvastatin 10 MG tablet Commonly known as: CRESTOR Take 1 tablet (10 mg total) by mouth daily.   tamsulosin 0.4 MG Caps capsule Commonly known as: FLOMAX Take 1 capsule (0.4 mg total) by mouth daily.   telmisartan 40 MG  tablet Commonly known as: MICARDIS Take 0.5 tablets (20 mg total) by mouth daily.   Vitamin D3 50 MCG (2000 UT) Tabs Take 2,000 Units by mouth daily.         Objective:   BP 133/71   Pulse 72   Ht _0  (1.702 m)   Wt 168 lb (76.2 kg)   SpO2 97%   BMI 26.31 kg/m   Wt Readings from Last 3 Encounters:  04/27/22 168 lb (76.2 kg)  01/05/22 172 lb (78 kg)  12/24/21 169 lb (76.7 kg)    Physical Exam Vitals and nursing note reviewed.  Constitutional:      General: He is not in acute distress.    Appearance: He is well-developed. He is not diaphoretic.  Eyes:     General: No scleral icterus.    Conjunctiva/sclera: Conjunctivae normal.  Neck:     Thyroid: No thyromegaly.  Cardiovascular:     Rate and Rhythm: Normal rate and regular rhythm.     Heart sounds: Normal heart sounds. No murmur heard. Pulmonary:     Effort: Pulmonary effort is normal. No respiratory distress.     Breath sounds: Normal breath sounds. No wheezing.  Musculoskeletal:        General: No swelling. Normal range of motion.     Cervical back: Neck supple.  Lymphadenopathy:     Cervical: No cervical adenopathy.  Skin:    General: Skin is warm and dry.     Findings: No rash.  Neurological:     Mental Status: He is alert and oriented to person, place, and time.     Coordination: Coordination normal.  Psychiatric:        Behavior: Behavior normal.       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  Seems to be doing well.  Will check blood work. Follow up plan: Return if symptoms worsen or fail to improve, for Hypertension and hypothyroidism and cholesterol..  Counseling provided for all  of the vaccine components Orders Placed This Encounter  Procedures   TSH   BMP8+EGFR    Caryl Pina, MD [facility] 04/27/2022, 10:29 AM

## 2022-05-16 ENCOUNTER — Ambulatory Visit (INDEPENDENT_AMBULATORY_CARE_PROVIDER_SITE_OTHER): Payer: Medicare HMO

## 2022-05-16 DIAGNOSIS — E538 Deficiency of other specified B group vitamins: Secondary | ICD-10-CM

## 2022-05-16 NOTE — Progress Notes (Signed)
Tolerated b12 very well

## 2022-06-01 ENCOUNTER — Telehealth: Payer: Self-pay | Admitting: Family Medicine

## 2022-06-01 NOTE — Telephone Encounter (Signed)
PT Wife r/c

## 2022-06-01 NOTE — Telephone Encounter (Signed)
Wife wants nurse to call her on her cell phone regarding med issue.

## 2022-06-02 NOTE — Telephone Encounter (Signed)
Wife would like his medication to be changed due to side effects.  States he is off balance and weak.  A family friend was taking the same medication and when she got off of it the side effects went away.  Covering PCP- please advise

## 2022-06-02 NOTE — Telephone Encounter (Signed)
Patients wife notified and stated that they would break in half until Dr Dettinger can review and advise. Forwarding to Dr Dettinger for his review

## 2022-06-02 NOTE — Telephone Encounter (Signed)
Lmtcb acc 12.28

## 2022-06-02 NOTE — Telephone Encounter (Signed)
Try breaking in half and taking half a tablet and see dr. Warrick Parisian to discuss

## 2022-06-08 NOTE — Telephone Encounter (Signed)
Left message for wife to return call 

## 2022-06-08 NOTE — Telephone Encounter (Signed)
Wife calling back.  °

## 2022-06-08 NOTE — Telephone Encounter (Signed)
Yes I agree with Mario Smith, try breaking it in half and then if not doing better with that then make an appointment to come see me and we can discuss options.

## 2022-06-08 NOTE — Telephone Encounter (Signed)
R/c

## 2022-06-10 NOTE — Telephone Encounter (Signed)
Tried calling pt. No answer. Left detailed message to take 1/2 of the '25mg'$  Metoprolol and if pt does not feel better that he needs an appt

## 2022-06-16 ENCOUNTER — Ambulatory Visit: Payer: No Typology Code available for payment source | Admitting: *Deleted

## 2022-06-16 DIAGNOSIS — E538 Deficiency of other specified B group vitamins: Secondary | ICD-10-CM | POA: Diagnosis not present

## 2022-06-16 NOTE — Progress Notes (Signed)
B12 injection given, left deltoid, intramuscular.  Patient tolerated well

## 2022-07-18 ENCOUNTER — Ambulatory Visit (INDEPENDENT_AMBULATORY_CARE_PROVIDER_SITE_OTHER): Payer: No Typology Code available for payment source | Admitting: *Deleted

## 2022-07-18 DIAGNOSIS — E538 Deficiency of other specified B group vitamins: Secondary | ICD-10-CM

## 2022-07-18 NOTE — Progress Notes (Signed)
B12 1000 mcg given IM in right deltoid. Patient tolerated well.

## 2022-07-19 DIAGNOSIS — H903 Sensorineural hearing loss, bilateral: Secondary | ICD-10-CM | POA: Diagnosis not present

## 2022-08-15 ENCOUNTER — Ambulatory Visit (INDEPENDENT_AMBULATORY_CARE_PROVIDER_SITE_OTHER): Payer: No Typology Code available for payment source

## 2022-08-15 DIAGNOSIS — E538 Deficiency of other specified B group vitamins: Secondary | ICD-10-CM | POA: Diagnosis not present

## 2022-08-15 NOTE — Progress Notes (Signed)
Cyanocobalamin injection given to left deltoid.  Patient tolerated well. 

## 2022-08-24 ENCOUNTER — Other Ambulatory Visit: Payer: Self-pay

## 2022-08-24 ENCOUNTER — Other Ambulatory Visit: Payer: No Typology Code available for payment source

## 2022-08-24 DIAGNOSIS — E0590 Thyrotoxicosis, unspecified without thyrotoxic crisis or storm: Secondary | ICD-10-CM | POA: Diagnosis not present

## 2022-08-24 DIAGNOSIS — E7800 Pure hypercholesterolemia, unspecified: Secondary | ICD-10-CM

## 2022-08-24 DIAGNOSIS — I10 Essential (primary) hypertension: Secondary | ICD-10-CM

## 2022-08-25 DIAGNOSIS — H903 Sensorineural hearing loss, bilateral: Secondary | ICD-10-CM | POA: Diagnosis not present

## 2022-08-25 LAB — CMP14+EGFR
ALT: 13 IU/L (ref 0–44)
AST: 17 IU/L (ref 0–40)
Albumin/Globulin Ratio: 1.6 (ref 1.2–2.2)
Albumin: 4.1 g/dL (ref 3.7–4.7)
Alkaline Phosphatase: 69 IU/L (ref 44–121)
BUN/Creatinine Ratio: 8 — ABNORMAL LOW (ref 10–24)
BUN: 10 mg/dL (ref 8–27)
Bilirubin Total: 0.6 mg/dL (ref 0.0–1.2)
CO2: 22 mmol/L (ref 20–29)
Calcium: 9.7 mg/dL (ref 8.6–10.2)
Chloride: 105 mmol/L (ref 96–106)
Creatinine, Ser: 1.3 mg/dL — ABNORMAL HIGH (ref 0.76–1.27)
Globulin, Total: 2.5 g/dL (ref 1.5–4.5)
Glucose: 94 mg/dL (ref 70–99)
Potassium: 4.6 mmol/L (ref 3.5–5.2)
Sodium: 143 mmol/L (ref 134–144)
Total Protein: 6.6 g/dL (ref 6.0–8.5)
eGFR: 55 mL/min/{1.73_m2} — ABNORMAL LOW (ref >59)

## 2022-08-25 LAB — CBC WITH DIFFERENTIAL/PLATELET
Basophils Absolute: 0.1 10*3/uL (ref 0.0–0.2)
Basos: 1 %
EOS (ABSOLUTE): 0.1 10*3/uL (ref 0.0–0.4)
Eos: 2 %
Hematocrit: 46.4 % (ref 37.5–51.0)
Hemoglobin: 15.3 g/dL (ref 13.0–17.7)
Immature Grans (Abs): 0 10*3/uL (ref 0.0–0.1)
Immature Granulocytes: 0 %
Lymphocytes Absolute: 1.5 10*3/uL (ref 0.7–3.1)
Lymphs: 23 %
MCH: 29.7 pg (ref 26.6–33.0)
MCHC: 33 g/dL (ref 31.5–35.7)
MCV: 90 fL (ref 79–97)
Monocytes Absolute: 0.7 10*3/uL (ref 0.1–0.9)
Monocytes: 11 %
Neutrophils Absolute: 4.1 10*3/uL (ref 1.4–7.0)
Neutrophils: 63 %
Platelets: 131 10*3/uL — ABNORMAL LOW (ref 150–450)
RBC: 5.15 x10E6/uL (ref 4.14–5.80)
RDW: 12.9 % (ref 11.6–15.4)
WBC: 6.5 10*3/uL (ref 3.4–10.8)

## 2022-08-25 LAB — THYROID PANEL WITH TSH
Free Thyroxine Index: 2.4 (ref 1.2–4.9)
T3 Uptake Ratio: 30 % (ref 24–39)
T4, Total: 7.9 ug/dL (ref 4.5–12.0)
TSH: 3.03 u[IU]/mL (ref 0.450–4.500)

## 2022-08-25 LAB — LIPID PANEL
Chol/HDL Ratio: 2.9 ratio (ref 0.0–5.0)
Cholesterol, Total: 132 mg/dL (ref 100–199)
HDL: 45 mg/dL (ref >39)
LDL Chol Calc (NIH): 70 mg/dL (ref 0–99)
Triglycerides: 89 mg/dL (ref 0–149)
VLDL Cholesterol Cal: 17 mg/dL (ref 5–40)

## 2022-08-26 ENCOUNTER — Ambulatory Visit: Payer: No Typology Code available for payment source | Admitting: Family Medicine

## 2022-08-26 ENCOUNTER — Encounter: Payer: Self-pay | Admitting: Family Medicine

## 2022-08-26 VITALS — BP 168/80 | HR 67 | Ht 67.0 in | Wt 172.0 lb

## 2022-08-26 DIAGNOSIS — E0590 Thyrotoxicosis, unspecified without thyrotoxic crisis or storm: Secondary | ICD-10-CM

## 2022-08-26 DIAGNOSIS — E0500 Thyrotoxicosis with diffuse goiter without thyrotoxic crisis or storm: Secondary | ICD-10-CM | POA: Diagnosis not present

## 2022-08-26 DIAGNOSIS — I10 Essential (primary) hypertension: Secondary | ICD-10-CM

## 2022-08-26 DIAGNOSIS — E7800 Pure hypercholesterolemia, unspecified: Secondary | ICD-10-CM | POA: Diagnosis not present

## 2022-08-26 MED ORDER — CARVEDILOL 6.25 MG PO TABS: 6.2500 mg | ORAL_TABLET | Freq: Two times a day (BID) | ORAL | 3 refills | Status: DC

## 2022-08-26 NOTE — Progress Notes (Signed)
BP (!) 168/80   Pulse 67   Ht 5\' 7"  (1.702 m)   Wt 172 lb (78 kg)   SpO2 97%   BMI 26.94 kg/m    Subjective:   Patient ID: Mario Smith, male    DOB: [birth year], 82 y.o.   MRN: [id number]  HPI: Mario Smith is a 82 y.o. male presenting on 08/26/2022 for Medical Management of Chronic Issues, Hypertension, and Hyperthyroidism  Hypertension Patient is coming in for a recheck of their hypertension. Patient is currently taking Metoprolol and Telmisartan. Patient's blood pressure today is 165/79. Recheck is 168/80. Patient has been taking their blood pressure at home twice daily. Average blood pressure readings at home are 120s/60s. Patient states he has been feeling dizzy and lightheaded, so he cut his Metoprolol in half and this helped some but he is still having dizzy and lightheaded spells occasionally. Patient denies weakness, headaches, blurred vision, chest pains, and shortness of breath.   Hyperthyroidism  Patient is coming in for thyroid recheck today. They deny any issues with hair changes, heat or cold intolerance, agitation, anxiety, chest pain or palpitations. They are currently on Methimazole.   Hyperlipidemia Patient is coming in for recheck of their hyperlipidemia. The patient is currently taking Fish Oils and Rosuvastatin. They deny any issues with myalgias or history of liver damage from it. They deny any focal numbness or weakness or chest pain.   Relevant past medical, surgical, family and social history were reviewed and updated as indicated. Interim medical history were reviewed. Allergies and medications were reviewed and updated.  Review of Systems  Constitutional:  Negative for chills and fever.  Eyes:  Negative for visual disturbance.  Gastrointestinal:  Negative for abdominal pain, constipation, diarrhea, nausea and vomiting.  Endocrine: Negative for cold intolerance and heat intolerance.  Musculoskeletal:  Negative for back pain, gait problem and myalgias.   Skin:  Negative for rash and wound.  Neurological:  Positive for dizziness and light-headedness. Negative for weakness, numbness and headaches.  Psychiatric/Behavioral:  Negative for agitation. The patient is not nervous/anxious and is not hyperactive.   All other systems reviewed and are negative.   Per HPI unless specifically indicated above.   Allergies as of 08/26/2022       Reactions   Simvastatin Other (See Comments)   Leg cramps   Sulfa Antibiotics    UNSPECIFIED REACTION    Codeine Nausea And Vomiting   Doxycycline Nausea And Vomiting   Hydrocodone Nausea And Vomiting   Meclizine Nausea Only   Niacin And Related Other (See Comments)   Flushing in the face and redness    Ondansetron Other (See Comments)   Tremors   Phenergan [promethazine Hcl] Other (See Comments)   Tremors        Medication List        Accurate as of August 26, 2022 11:18 AM. If you have any questions, ask your nurse or doctor.          STOP taking these medications    metoprolol tartrate 25 MG tablet Commonly known as: LOPRESSOR Stopped by: Fransisca Kaufmann Arrionna Serena, MD       TAKE these medications    albuterol 108 (90 Base) MCG/ACT inhaler Commonly known as: VENTOLIN HFA Inhale 2 puffs into the lungs every 6 (six) hours as needed for wheezing or shortness of breath.   aspirin EC 81 MG tablet Take by mouth.   betamethasone (augmented) 0.05 % lotion Commonly known as: DIPROLENE SMARTSIG:Sparingly Topical Every  Evening PRN   carboxymethylcellulose 0.5 % Soln Commonly known as: REFRESH PLUS Place 1 drop into both eyes as needed (dry eyes).   carvedilol 6.25 MG tablet Commonly known as: COREG Take 1 tablet (6.25 mg total) by mouth 2 (two) times daily with a meal. Started by: Fransisca Kaufmann Jenipher Havel, MD   Co Q 10 100 MG Caps Take 100 mg by mouth daily.   diclofenac Sodium 1 % Gel Commonly known as: Voltaren Apply 2 g topically 4 (four) times daily.   Fish Oil 1000 MG Caps Take  1,000 mg by mouth daily.   fluticasone 50 MCG/ACT nasal spray Commonly known as: FLONASE SPRAY 2 SPRAYS INTO EACH NOSTRIL EVERY DAY   methimazole 5 MG tablet Commonly known as: TAPAZOLE Take 1 tablet (5 mg total) by mouth 2 (two) times a week.   pantoprazole 40 MG tablet Commonly known as: PROTONIX Take 1 tablet (40 mg total) by mouth 2 (two) times daily.   rosuvastatin 10 MG tablet Commonly known as: CRESTOR Take 1 tablet (10 mg total) by mouth daily.   tamsulosin 0.4 MG Caps capsule Commonly known as: FLOMAX Take 1 capsule (0.4 mg total) by mouth daily.   telmisartan 40 MG tablet Commonly known as: MICARDIS Take 0.5 tablets (20 mg total) by mouth daily.   Vitamin D3 50 MCG (2000 UT) Tabs Generic drug: Cholecalciferol Take 2,000 Units by mouth daily.         Objective:   BP (!) 168/80   Pulse 67   Ht 5\' 7"  (1.702 m)   Wt 78 kg   SpO2 97%   BMI 26.94 kg/m   Wt Readings from Last 3 Encounters:  08/26/22 78 kg  04/27/22 76.2 kg  01/05/22 78 kg    Physical Exam Vitals reviewed.  Constitutional:      Appearance: Normal appearance.  HENT:     Head: Normocephalic and atraumatic.  Eyes:     General: Lids are normal. No scleral icterus.    Conjunctiva/sclera: Conjunctivae normal.  Neck:     Thyroid: No thyromegaly.  Cardiovascular:     Rate and Rhythm: Normal rate and regular rhythm.     Heart sounds: Normal heart sounds.  Pulmonary:     Effort: Pulmonary effort is normal.     Breath sounds: Normal breath sounds.  Abdominal:     Tenderness: There is no right CVA tenderness or left CVA tenderness.  Musculoskeletal:     Cervical back: Neck supple. No tenderness.     Right lower leg: No edema.     Left lower leg: No edema.  Lymphadenopathy:     Cervical: No cervical adenopathy.  Skin:    General: Skin is warm and dry.  Neurological:     Mental Status: He is alert and oriented to person, place, and time.     Gait: Gait normal.  Psychiatric:         Mood and Affect: Mood normal.        Behavior: Behavior normal.    Results for orders placed or performed in visit on 08/24/22  Thyroid Panel With TSH  Result Value Ref Range   TSH 3.030 0.450 - 4.500 uIU/mL   T4, Total 7.9 4.5 - 12.0 ug/dL   T3 Uptake Ratio 30 24 - 39 %   Free Thyroxine Index 2.4 1.2 - 4.9  Lipid panel  Result Value Ref Range   Cholesterol, Total 132 100 - 199 mg/dL   Triglycerides 89 0 - 149 mg/dL  HDL 45 >39 mg/dL   VLDL Cholesterol Cal 17 5 - 40 mg/dL   LDL Chol Calc (NIH) 70 0 - 99 mg/dL   Chol/HDL Ratio 2.9 0.0 - 5.0 ratio  CMP14+EGFR  Result Value Ref Range   Glucose 94 70 - 99 mg/dL   BUN 10 8 - 27 mg/dL   Creatinine, Ser 1.30 (H) 0.76 - 1.27 mg/dL   eGFR 55 (L) >59 mL/min/1.73   BUN/Creatinine Ratio 8 (L) 10 - 24   Sodium 143 134 - 144 mmol/L   Potassium 4.6 3.5 - 5.2 mmol/L   Chloride 105 96 - 106 mmol/L   CO2 22 20 - 29 mmol/L   Calcium 9.7 8.6 - 10.2 mg/dL   Total Protein 6.6 6.0 - 8.5 g/dL   Albumin 4.1 3.7 - 4.7 g/dL   Globulin, Total 2.5 1.5 - 4.5 g/dL   Albumin/Globulin Ratio 1.6 1.2 - 2.2   Bilirubin Total 0.6 0.0 - 1.2 mg/dL   Alkaline Phosphatase 69 44 - 121 IU/L   AST 17 0 - 40 IU/L   ALT 13 0 - 44 IU/L  CBC with Differential/Platelet  Result Value Ref Range   WBC 6.5 3.4 - 10.8 x10E3/uL   RBC 5.15 4.14 - 5.80 x10E6/uL   Hemoglobin 15.3 13.0 - 17.7 g/dL   Hematocrit 46.4 37.5 - 51.0 %   MCV 90 79 - 97 fL   MCH 29.7 26.6 - 33.0 pg   MCHC 33.0 31.5 - 35.7 g/dL   RDW 12.9 11.6 - 15.4 %   Platelets 131 (L) 150 - 450 x10E3/uL   Neutrophils 63 Not Estab. %   Lymphs 23 Not Estab. %   Monocytes 11 Not Estab. %   Eos 2 Not Estab. %   Basos 1 Not Estab. %   Neutrophils Absolute 4.1 1.4 - 7.0 x10E3/uL   Lymphocytes Absolute 1.5 0.7 - 3.1 x10E3/uL   Monocytes Absolute 0.7 0.1 - 0.9 x10E3/uL   EOS (ABSOLUTE) 0.1 0.0 - 0.4 x10E3/uL   Basophils Absolute 0.1 0.0 - 0.2 x10E3/uL   Immature Granulocytes 0 Not Estab. %   Immature Grans  (Abs) 0.0 0.0 - 0.1 x10E3/uL    Assessment & Plan:   Problem List Items Addressed This Visit       Cardiovascular and Mediastinum   Hypertension - Primary   Relevant Medications   carvedilol (COREG) 6.25 MG tablet     Endocrine   Graves disease   Relevant Medications   carvedilol (COREG) 6.25 MG tablet   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   Relevant Medications   carvedilol (COREG) 6.25 MG tablet     Other   Hyperlipidemia   Relevant Medications   carvedilol (COREG) 6.25 MG tablet  Labs completed two days ago. Discussed results with patient. Patient instructed to increase water intake to maintain adequate kidney function due to low eGFR and BUN/Creatinine ratio, and elevated creatinine.   Discontinue Metoprolol. Begin Carvedilol 6.25 mg to see if there are reductions in dizziness and lightheadedness. Continue Telmisartan. Continue all other medications as prescribed.  Follow up plan: Return in about 4 months (around 12/26/2022), or if symptoms worsen or fail to improve, for Hypertension and hyperthyroidism.  Counseling provided for all of the vaccine components No orders of the defined types were placed in this encounter.  Summer [place], PA-S2 [employer] 08/26/2022, 11:18 AM  Patient feels like the metoprolol is not doing as well for him so he is really not taking it because he just does not  feel well, we will switch him to carvedilol and see if it reduces lightheadedness and dizziness and will continue his telmisartan he will monitor his blood pressure closely over the next couple weeks and let us know. Mario Smith [employer] 08/26/2022, 11:18 AM

## 2022-09-16 ENCOUNTER — Ambulatory Visit: Payer: No Typology Code available for payment source

## 2022-09-20 ENCOUNTER — Ambulatory Visit (INDEPENDENT_AMBULATORY_CARE_PROVIDER_SITE_OTHER): Payer: No Typology Code available for payment source

## 2022-09-20 DIAGNOSIS — E538 Deficiency of other specified B group vitamins: Secondary | ICD-10-CM

## 2022-09-20 NOTE — Progress Notes (Signed)
Cyanocobalamin injection given to right deltoid.  Patient tolerated well. 

## 2022-09-23 ENCOUNTER — Telehealth: Payer: Self-pay | Admitting: Family Medicine

## 2022-09-23 NOTE — Telephone Encounter (Signed)
Contacted Mario Smith to schedule their annual wellness visit. Appointment made for 09/26/2022.  Thank you,  Judeth Cornfield,  AMB Clinical Support [facility] AWV Program Direct Dial ??[phone]

## 2022-09-26 ENCOUNTER — Ambulatory Visit: Payer: No Typology Code available for payment source

## 2022-09-26 VITALS — Ht 67.0 in | Wt 172.0 lb

## 2022-09-26 DIAGNOSIS — Z0000 Encounter for general adult medical examination without abnormal findings: Secondary | ICD-10-CM

## 2022-09-26 NOTE — Patient Instructions (Signed)
Mario Smith , Thank you for taking time to come for your Medicare Wellness Visit. I appreciate your ongoing commitment to your health goals. Please review the following plan we discussed and let me know if I can assist you in the future.   These are the goals we discussed:  Goals      DIET - INCREASE WATER INTAKE     Try to drink 6-8 glasses of water daily.        This is a list of the screening recommended for you and due dates:  Health Maintenance  Topic Date Due   COVID-19 Vaccine (4 - 2023-24 season) 08/12/2022   Flu Shot  01/05/2023   Medicare Annual Wellness Visit  09/26/2023   DTaP/Tdap/Td vaccine (5 - Td or Tdap) 08/26/2031   Pneumonia Vaccine  Completed   Zoster (Shingles) Vaccine  Completed   HPV Vaccine  Aged Out    Advanced directives: Please bring a copy of your health care power of attorney and living will to the office to be added to your chart at your convenience.   Conditions/risks identified: Aim for 30 minutes of exercise or brisk walking, 6-8 glasses of water, and 5 servings of fruits and vegetables each day.   Next appointment: Follow up in one year for your annual wellness visit.   Preventive Care 79 Years and Older, Male  Preventive care refers to lifestyle choices and visits with your health care provider that can promote health and wellness. What does preventive care include? A yearly physical exam. This is also called an annual well check. Dental exams once or twice a year. Routine eye exams. Ask your health care provider how often you should have your eyes checked. Personal lifestyle choices, including: Daily care of your teeth and gums. Regular physical activity. Eating a healthy diet. Avoiding tobacco and drug use. Limiting alcohol use. Practicing safe sex. Taking low doses of aspirin every day. Taking vitamin and mineral supplements as recommended by your health care provider. What happens during an annual well check? The services and  screenings done by your health care provider during your annual well check will depend on your age, overall health, lifestyle risk factors, and family history of disease. Counseling  Your health care provider may ask you questions about your: Alcohol use. Tobacco use. Drug use. Emotional well-being. Home and relationship well-being. Sexual activity. Eating habits. History of falls. Memory and ability to understand (cognition). Work and work Astronomer. Screening  You may have the following tests or measurements: Height, weight, and BMI. Blood pressure. Lipid and cholesterol levels. These may be checked every 5 years, or more frequently if you are over 61 years old. Skin check. Lung cancer screening. You may have this screening every year starting at age 57 if you have a 30-pack-year history of smoking and currently smoke or have quit within the past 15 years. Fecal occult blood test (FOBT) of the stool. You may have this test every year starting at age 54. Flexible sigmoidoscopy or colonoscopy. You may have a sigmoidoscopy every 5 years or a colonoscopy every 10 years starting at age 57. Prostate cancer screening. Recommendations will vary depending on your family history and other risks. Hepatitis C blood test. Hepatitis B blood test. Sexually transmitted disease (STD) testing. Diabetes screening. This is done by checking your blood sugar (glucose) after you have not eaten for a while (fasting). You may have this done every 1-3 years. Abdominal aortic aneurysm (AAA) screening. You may need this if  you are a current or former smoker. Osteoporosis. You may be screened starting at age 34 if you are at high risk. Talk with your health care provider about your test results, treatment options, and if necessary, the need for more tests. Vaccines  Your health care provider may recommend certain vaccines, such as: Influenza vaccine. This is recommended every year. Tetanus, diphtheria, and  acellular pertussis (Tdap, Td) vaccine. You may need a Td booster every 10 years. Zoster vaccine. You may need this after age 62. Pneumococcal 13-valent conjugate (PCV13) vaccine. One dose is recommended after age 65. Pneumococcal polysaccharide (PPSV23) vaccine. One dose is recommended after age 3. Talk to your health care provider about which screenings and vaccines you need and how often you need them. This information is not intended to replace advice given to you by your health care provider. Make sure you discuss any questions you have with your health care provider. Document Released: 06/19/2015 Document Revised: 02/10/2016 Document Reviewed: 03/24/2015 Elsevier Interactive Patient Education  2017 [employer] Prevention in the Home Falls can cause injuries. They can happen to people of all ages. There are many things you can do to make your home safe and to help prevent falls. What can I do on the outside of my home? Regularly fix the edges of walkways and driveways and fix any cracks. Remove anything that might make you trip as you walk through a door, such as a raised step or threshold. Trim any bushes or trees on the path to your home. Use bright outdoor lighting. Clear any walking paths of anything that might make someone trip, such as rocks or tools. Regularly check to see if handrails are loose or broken. Make sure that both sides of any steps have handrails. Any raised decks and porches should have guardrails on the edges. Have any leaves, snow, or ice cleared regularly. Use sand or salt on walking paths during winter. Clean up any spills in your garage right away. This includes oil or grease spills. What can I do in the bathroom? Use night lights. Install grab bars by the toilet and in the tub and shower. Do not use towel bars as grab bars. Use non-skid mats or decals in the tub or shower. If you need to sit down in the shower, use a plastic, non-slip stool. Keep  the floor dry. Clean up any water that spills on the floor as soon as it happens. Remove soap buildup in the tub or shower regularly. Attach bath mats securely with double-sided non-slip rug tape. Do not have throw rugs and other things on the floor that can make you trip. What can I do in the bedroom? Use night lights. Make sure that you have a light by your bed that is easy to reach. Do not use any sheets or blankets that are too big for your bed. They should not hang down onto the floor. Have a firm chair that has side arms. You can use this for support while you get dressed. Do not have throw rugs and other things on the floor that can make you trip. What can I do in the kitchen? Clean up any spills right away. Avoid walking on wet floors. Keep items that you use a lot in easy-to-reach places. If you need to reach something above you, use a strong step stool that has a grab bar. Keep electrical cords out of the way. Do not use floor polish or wax that makes floors slippery. If  you must use wax, use non-skid floor wax. Do not have throw rugs and other things on the floor that can make you trip. What can I do with my stairs? Do not leave any items on the stairs. Make sure that there are handrails on both sides of the stairs and use them. Fix handrails that are broken or loose. Make sure that handrails are as long as the stairways. Check any carpeting to make sure that it is firmly attached to the stairs. Fix any carpet that is loose or worn. Avoid having throw rugs at the top or bottom of the stairs. If you do have throw rugs, attach them to the floor with carpet tape. Make sure that you have a light switch at the top of the stairs and the bottom of the stairs. If you do not have them, ask someone to add them for you. What else can I do to help prevent falls? Wear shoes that: Do not have high heels. Have rubber bottoms. Are comfortable and fit you well. Are closed at the toe. Do not  wear sandals. If you use a stepladder: Make sure that it is fully opened. Do not climb a closed stepladder. Make sure that both sides of the stepladder are locked into place. Ask someone to hold it for you, if possible. Clearly mark and make sure that you can see: Any grab bars or handrails. First and last steps. Where the edge of each step is. Use tools that help you move around (mobility aids) if they are needed. These include: Canes. Walkers. Scooters. Crutches. Turn on the lights when you go into a dark area. Replace any light bulbs as soon as they burn out. Set up your furniture so you have a clear path. Avoid moving your furniture around. If any of your floors are uneven, fix them. If there are any pets around you, be aware of where they are. Review your medicines with your doctor. Some medicines can make you feel dizzy. This can increase your chance of falling. Ask your doctor what other things that you can do to help prevent falls. This information is not intended to replace advice given to you by your health care provider. Make sure you discuss any questions you have with your health care provider. Document Released: 03/19/2009 Document Revised: 10/29/2015 Document Reviewed: 06/27/2014 Elsevier Interactive Patient Education  2017 [employer].

## 2022-09-26 NOTE — Progress Notes (Signed)
Subjective:   Mario Smith is a 82 y.o. male who presents for Medicare Annual/Subsequent preventive examination.  I connected with  Chue R Kirkwood on 09/26/22 by a audio enabled telemedicine application and verified that I am speaking with the correct person using two identifiers.  Patient Location: Home  Provider Location: Home Office  I discussed the limitations of evaluation and management by telemedicine. The patient expressed understanding and agreed to proceed.  Review of Systems     Cardiac Risk Factors include: advanced age (>23men, >67 women);hypertension;male gender;dyslipidemia     Objective:    Today's Vitals   09/26/22 1307  Weight: 172 lb (78 kg)  Height:  (1.702 m)   Body mass index is 26.94 kg/m.     09/26/2022    1:18 PM 05/27/2020    3:11 PM 05/24/2019    1:44 PM 01/10/2018   11:25 AM 10/10/2017    6:06 PM 02/11/2016    2:42 PM 05/16/2014    9:27 AM  Advanced Directives  Does Patient Have a Medical Advance Directive? Yes Yes Yes Yes Yes Yes Yes  Type of Special educational needs teacher of [place];Living will Living will Living will Living will Healthcare Power of Attorney Living will  Does patient want to make changes to medical advance directive? No - Patient declined No - Patient declined No - Patient declined No - Patient declined No - Patient declined  No - Patient declined  Copy of Healthcare Power of Attorney in Chart?  No - copy requested     No - copy requested    Current Medications (verified) Outpatient Encounter Medications as of 09/26/2022  Medication Sig   albuterol (VENTOLIN HFA) 108 (90 Base) MCG/ACT inhaler Inhale 2 puffs into the lungs every 6 (six) hours as needed for wheezing or shortness of breath.   aspirin 81 MG EC tablet Take by mouth.   betamethasone, augmented, (DIPROLENE) 0.05 % lotion SMARTSIG:Sparingly Topical Every Evening PRN   carboxymethylcellulose (REFRESH PLUS) 0.5 % SOLN Place 1 drop into both eyes as needed  (dry eyes).   carvedilol (COREG) 6.25 MG tablet Take 1 tablet (6.25 mg total) by mouth 2 (two) times daily with a meal.   Cholecalciferol (VITAMIN D3) 2000 UNITS TABS Take 2,000 Units by mouth daily.   Coenzyme Q10 (CO Q 10) 100 MG CAPS Take 100 mg by mouth daily.   diclofenac Sodium (VOLTAREN) 1 % GEL Apply 2 g topically 4 (four) times daily.   fluticasone (FLONASE) 50 MCG/ACT nasal spray SPRAY 2 SPRAYS INTO EACH NOSTRIL EVERY DAY   methimazole (TAPAZOLE) 5 MG tablet Take 1 tablet (5 mg total) by mouth 2 (two) times a week.   Omega-3 Fatty Acids (FISH OIL) 1000 MG CAPS Take 1,000 mg by mouth daily.   pantoprazole (PROTONIX) 40 MG tablet Take 1 tablet (40 mg total) by mouth 2 (two) times daily.   rosuvastatin (CRESTOR) 10 MG tablet Take 1 tablet (10 mg total) by mouth daily.   tamsulosin (FLOMAX) 0.4 MG CAPS capsule Take 1 capsule (0.4 mg total) by mouth daily.   telmisartan (MICARDIS) 40 MG tablet Take 0.5 tablets (20 mg total) by mouth daily.   Facility-Administered Encounter Medications as of 09/26/2022  Medication   cyanocobalamin ((VITAMIN B-12)) injection 1,000 mcg    Allergies (verified) Simvastatin, Sulfa antibiotics, Codeine, Doxycycline, Hydrocodone, Meclizine, Niacin and related, Ondansetron, and Phenergan [promethazine hcl]   History: Past Medical History:  Diagnosis Date   Abdominal pain 10/10/2017   Arthritis  Back pain    Colon polyp 2011   tubular adenoma.  no HG dysplasia.     Constipation    COPD (chronic obstructive pulmonary disease)    no per pt   Dehydration 10/10/2017   Dyspnea    with exertion   ED (erectile dysfunction)    Essential hypertension, benign    Gallbladder sludge 10/2017   GERD (gastroesophageal reflux disease)    acute gastritis   Graves disease    History of kidney stones    HTN (hypertension)    Other and unspecified hyperlipidemia    PONV (postoperative nausea and vomiting)    Pt's wife also said that when he had a nerve block in  2015 here, pt had a reaction immediately. 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 Higher risk screening study, sister's hx of colon CA.     ESOPHAGOGASTRODUODENOSCOPY (EGD) WITH PROPOFOL N/A 10/11/2017   Procedure: ESOPHAGOGASTRODUODENOSCOPY (EGD) WITH PROPOFOL;  Surgeon: Napoleon Form, MD;  Location: [facility];  Service: Endoscopy;  Laterality: N/A;   EYE SURGERY     lid-lift   SPINAL FUSION  2001   cervical   TENDON REPAIR Right 05/16/2014   Procedure: RIGHT RING FINGER FLEXOR TENDON REPAIR WITH PULL OUT BUTTON;  Surgeon: Dominica Severin, MD;  Location: [facility];  Service: Orthopedics;  Laterality: Right;   UPPER GASTROINTESTINAL ENDOSCOPY     Family History  Problem Relation Age of Onset   Heart disease Mother    COPD Mother    Heart disease Father    COPD Father    Alcohol abuse Father    Cancer Sister        colon cancer   Emphysema Sister    Colon cancer Sister    Cancer Brother        prostate   Emphysema Sister    Prostate cancer Brother    Healthy Daughter    Esophageal cancer Neg Hx    Stomach cancer Neg Hx    Rectal cancer Neg Hx    Social History   Socioeconomic History   Marital status: Married    Spouse name: Mary   Number of children: 1   Years of education: 8   Highest education level: 8th grade  Occupational History   Occupation: retired  Tobacco Use   Smoking status: Former    Types: Cigarettes    Quit date: 12/19/1992    Years since quitting:  29.7   Smokeless tobacco: Former    Types: Chew    Quit date: 12/19/1992  Vaping Use   Vaping Use: Never used  Substance and Sexual Activity   Alcohol use: Not Currently   Drug use: No   Sexual activity: Yes  Other Topics Concern   Not on file  Social History Narrative   Active with yardwork/gardening    Social Determinants of Health   Financial Resource Strain: Low Risk  (09/26/2022)   Overall Financial Resource Strain (CARDIA)  Difficulty of Paying Living Expenses: Not hard at all  Food Insecurity: No Food Insecurity (09/26/2022)   Hunger Vital Sign    Worried About Running Out of Food in the Last Year: Never true    Ran Out of Food in the Last Year: Never true  Transportation Needs: No Transportation Needs (09/26/2022)   PRAPARE - Administrator, Civil Service (Medical): No    Lack of Transportation (Non-Medical): No  Physical Activity: Sufficiently Active (09/26/2022)   Exercise Vital Sign    Days of Exercise per Week: 5 days    Minutes of Exercise per Session: 30 min  Stress: No Stress Concern Present (09/26/2022)   [employer] of Occupational Health - Occupational Stress Questionnaire    Feeling of Stress : Not at all  Social Connections: Socially Integrated (09/26/2022)   Social Connection and Isolation Panel [NHANES]    Frequency of Communication with Friends and Family: More than three times a week    Frequency of Social Gatherings with Friends and Family: More than three times a week    Attends Religious Services: More than 4 times per year    Active Member of [employer] or Organizations: Yes    Attends Engineer, structural: More than 4 times per year    Marital Status: Married    Tobacco Counseling Counseling given: Not Answered   Clinical Intake:  Pre-visit preparation completed: Yes  Pain : No/denies pain  Diabetes: No  How often do you need to have someone help you when you read instructions, pamphlets, or other written materials  from your doctor or pharmacy?: 1 - Never  Diabetic?No   Interpreter Needed?: No  Information entered by :: Kandis Fantasia LPN   Activities of Daily Living    09/26/2022    1:18 PM  In your present state of health, do you have any difficulty performing the following activities:  Hearing? 0  Vision? 0  Difficulty concentrating or making decisions? 0  Walking or climbing stairs? 0  Dressing or bathing? 0  Doing errands, shopping? 0  Preparing Food and eating ? N  Using the Toilet? N  In the past six months, have you accidently leaked urine? N  Do you have problems with loss of bowel control? N  Managing your Medications? N  Managing your Finances? N  Housekeeping or managing your Housekeeping? N    Patient Care Team: Dettinger, Elige Radon, MD as PCP - General (Family Medicine) Louis Meckel, MD (Inactive) as Attending Physician (Gastroenterology) Ebbie Latus, MD as Referring Physician (Endocrinology) Saintclair Halsted, OD as Referring Physician (Ophthalmology)  Indicate any recent Medical Services you may have received from other than [facility] providers in the past year (date may be approximate).     Assessment:   This is a routine wellness examination for Jerod.  Hearing/Vision screen Hearing Screening - Comments:: Denies hearing difficulties   Vision Screening - Comments:: Wears rx glasses - up to date with routine eye exams with [facility]. Wyn Forster    Dietary issues and exercise activities discussed: Current Exercise Habits: Home exercise routine, Type of exercise: walking;stretching, Time (Minutes): 30, Frequency (Times/Week): 5, Weekly Exercise (Minutes/Week): 150, Intensity: Mild   Goals Addressed             This Visit's Progress    COMPLETED: Exercise 3x per week (30 min per time)       Walk for 30 minutes 3 times a week as tolerated. May need to build up to  30 minutes slowly.   05/27/2020 AWV Goal: Exercise for General Health  Patient will  verbalize understanding of the benefits of increased physical activity: Exercising regularly is important. It will improve your overall fitness, flexibility, and endurance. Regular exercise also will improve your overall health. It can help you control your weight, reduce stress, and improve your bone density. Over the next year, patient will increase physical activity as tolerated with a goal of at least 150 minutes of moderate physical activity per week.  You can tell that you are exercising at a moderate intensity if your heart starts beating faster and you start breathing faster but can still hold a conversation. Moderate-intensity exercise ideas include: Walking 1 mile (1.6 km) in about 15 minutes Biking Hiking Golfing Dancing Water aerobics Patient will verbalize understanding of everyday activities that increase physical activity by providing examples like the following: Yard work, such as: Insurance underwriter Gardening Washing windows or floors Patient will be able to explain general safety guidelines for exercising:  Before you start a new exercise program, talk with your health care provider. Do not exercise so much that you hurt yourself, feel dizzy, or get very short of breath. Wear comfortable clothes and wear shoes with good support. Drink plenty of water while you exercise to prevent dehydration or heat stroke. Work out until your breathing and your heartbeat get faster.       Depression Screen    09/26/2022    1:17 PM 08/26/2022   10:45 AM 04/27/2022   10:05 AM 01/05/2022    9:22 AM 12/24/2021   11:32 AM 10/25/2021   11:35 AM 10/11/2021   11:48 AM  PHQ 2/9 Scores  PHQ - 2 Score 0 0 0 0 0 0 0  PHQ- 9 Score 0  0 0 1 0 0    Fall Risk    09/26/2022    1:13 PM 08/26/2022   10:45 AM 04/27/2022   10:05 AM 01/05/2022    9:22 AM 12/24/2021   11:32 AM  Fall Risk   Falls in the past year? 0 0 0  0 0  Number falls in past yr: 0      Injury with Fall? 0      Risk for fall due to : No Fall Risks      Follow up Falls prevention discussed;Education provided;Falls evaluation completed        FALL RISK PREVENTION PERTAINING TO THE HOME:  Any stairs in or around the home? No  If so, are there any without handrails? No  Home free of loose throw rugs in walkways, pet beds, electrical cords, etc? Yes  Adequate lighting in your home to reduce risk of falls? Yes   ASSISTIVE DEVICES UTILIZED TO PREVENT FALLS:  Life alert? No  Use of a cane, walker or w/c? No  Grab bars in the bathroom? Yes  Shower chair or bench in shower? No  Elevated toilet seat or a handicapped toilet? Yes   TIMED UP AND GO:  Was the test performed? No . Telephonic visit  Cognitive Function:    04/19/2016    2:57 PM  MMSE - Mini Mental State Exam  Orientation to time 5  Orientation to Place 5  Registration 3  Attention/ Calculation 4  Recall 3  Language- name 2 objects 2  Language- repeat 1  Language- follow 3 step command 3  Language- read & follow direction 1  Write a sentence 1  Copy design 1  Total score 29        09/26/2022    1:18 PM 05/27/2020    3:12 PM 05/24/2019    1:49 PM  6CIT Screen  What Year? 0 points 0 points 0 points  What month? 0 points 0 points 0 points  What time? 0 points 0 points 0 points  Count back from 20 0 points 0 points 2 points  Months in reverse 0 points 0 points 2 points  Repeat phrase 0 points 0 points 0 points  Total Score 0 points 0 points 4 points    Immunizations Immunization History  Administered Date(s) Administered   Fluad Quad(high Dose 65+) 03/21/2019, 03/30/2020, 04/01/2021, 03/14/2022   Influenza Whole 11/04/2009   Influenza, High Dose Seasonal PF 04/02/2015, 04/19/2016, 03/09/2017, 03/08/2018   Influenza,inj,Quad PF,6+ Mos 03/27/2013, 04/16/2014   Moderna Sars-Covid-2 Vaccination 07/16/2019, 08/13/2019, 08/20/2020   PFIZER SARS-COV-2  Pediatric Vaccination 5-27yrs 08/06/2020   Pneumococcal Conjugate-13 05/27/2013   Pneumococcal Polysaccharide-23 01/12/2007   Td 12/08/2002, 03/16/2011   Tdap 03/16/2011, 08/25/2021   Zoster Recombinat (Shingrix) 09/08/2021, 01/14/2022   Zoster, Live 09/05/2006    TDAP status: Up to date  Flu Vaccine status: Up to date  Pneumococcal vaccine status: Up to date  Covid-19 vaccine status: Information provided on how to obtain vaccines.   Qualifies for Shingles Vaccine? Yes   Zostavax completed Yes   Shingrix Completed?: Yes  Screening Tests Health Maintenance  Topic Date Due   COVID-19 Vaccine (4 - 2023-24 season) 08/12/2022   INFLUENZA VACCINE  01/05/2023   Medicare Annual Wellness (AWV)  09/26/2023   DTaP/Tdap/Td (5 - Td or Tdap) 08/26/2031   Pneumonia Vaccine 91+ Years old  Completed   Zoster Vaccines- Shingrix  Completed   HPV VACCINES  Aged Out    Health Maintenance  Health Maintenance Due  Topic Date Due   COVID-19 Vaccine (4 - 2023-24 season) 08/12/2022    Colorectal cancer screening: No longer required.   Lung Cancer Screening: (Low Dose CT Chest recommended if Age 14-80 years, 30 pack-year currently smoking OR have quit w/in 15years.) does not qualify.   Lung Cancer Screening Referral: n/a  Additional Screening:  Hepatitis C Screening: does not qualify  Vision Screening: Recommended annual ophthalmology exams for early detection of glaucoma and other disorders of the eye. Is the patient up to date with their annual eye exam?  Yes  Who is the provider or what is the name of the office in which the patient attends annual eye exams? [facility]  If pt is not established with a provider, would they like to be referred to a provider to establish care? No .   Dental Screening: Recommended annual dental exams for proper oral hygiene  Community Resource Referral / Chronic Care Management: CRR required this visit?  No   CCM required this visit?  No      Plan:      I have personally reviewed and noted the following in the patient's chart:   Medical and social history Use of alcohol, tobacco or illicit drugs  Current medications and supplements including opioid prescriptions. Patient is not currently taking opioid prescriptions. Functional ability and status Nutritional status Physical activity Advanced directives List of other physicians Hospitalizations, surgeries, and ER visits in previous 12 months Vitals Screenings to include cognitive, depression, and falls Referrals and appointments  In addition, I have reviewed and discussed with patient certain preventive protocols, quality metrics, and best practice  recommendations. A written personalized care plan for preventive services as well as general preventive health recommendations were provided to patient.     Durwin Nora, [place]   11/24/3084   Due to this being a virtual visit, the after visit summary with patients personalized plan was offered to patient via mail or my-chart. per request, patient was mailed a copy of AVS.  Nurse Notes: No concerns

## 2022-10-05 ENCOUNTER — Emergency Department (HOSPITAL_COMMUNITY): Payer: No Typology Code available for payment source

## 2022-10-05 ENCOUNTER — Inpatient Hospital Stay (HOSPITAL_COMMUNITY)
Admission: EM | Admit: 2022-10-05 | Discharge: 2022-11-05 | DRG: 061 | Disposition: E | Payer: No Typology Code available for payment source | Attending: Neurology | Admitting: Neurology

## 2022-10-05 ENCOUNTER — Encounter (HOSPITAL_COMMUNITY): Payer: Self-pay | Admitting: Radiology

## 2022-10-05 ENCOUNTER — Other Ambulatory Visit: Payer: Self-pay

## 2022-10-05 ENCOUNTER — Inpatient Hospital Stay (HOSPITAL_COMMUNITY): Payer: No Typology Code available for payment source

## 2022-10-05 DIAGNOSIS — R251 Tremor, unspecified: Secondary | ICD-10-CM | POA: Clinically undetermined

## 2022-10-05 DIAGNOSIS — I619 Nontraumatic intracerebral hemorrhage, unspecified: Secondary | ICD-10-CM | POA: Diagnosis not present

## 2022-10-05 DIAGNOSIS — N1831 Chronic kidney disease, stage 3a: Secondary | ICD-10-CM | POA: Diagnosis not present

## 2022-10-05 DIAGNOSIS — H510 Palsy (spasm) of conjugate gaze: Secondary | ICD-10-CM | POA: Diagnosis present

## 2022-10-05 DIAGNOSIS — J9811 Atelectasis: Secondary | ICD-10-CM | POA: Diagnosis not present

## 2022-10-05 DIAGNOSIS — I161 Hypertensive emergency: Secondary | ICD-10-CM | POA: Diagnosis present

## 2022-10-05 DIAGNOSIS — D649 Anemia, unspecified: Secondary | ICD-10-CM | POA: Diagnosis not present

## 2022-10-05 DIAGNOSIS — I611 Nontraumatic intracerebral hemorrhage in hemisphere, cortical: Secondary | ICD-10-CM | POA: Diagnosis present

## 2022-10-05 DIAGNOSIS — E039 Hypothyroidism, unspecified: Secondary | ICD-10-CM | POA: Diagnosis present

## 2022-10-05 DIAGNOSIS — I63511 Cerebral infarction due to unspecified occlusion or stenosis of right middle cerebral artery: Principal | ICD-10-CM | POA: Diagnosis present

## 2022-10-05 DIAGNOSIS — I129 Hypertensive chronic kidney disease with stage 1 through stage 4 chronic kidney disease, or unspecified chronic kidney disease: Secondary | ICD-10-CM | POA: Diagnosis not present

## 2022-10-05 DIAGNOSIS — G936 Cerebral edema: Secondary | ICD-10-CM | POA: Diagnosis not present

## 2022-10-05 DIAGNOSIS — Z66 Do not resuscitate: Secondary | ICD-10-CM | POA: Diagnosis present

## 2022-10-05 DIAGNOSIS — I63411 Cerebral infarction due to embolism of right middle cerebral artery: Secondary | ICD-10-CM | POA: Diagnosis not present

## 2022-10-05 DIAGNOSIS — Z79899 Other long term (current) drug therapy: Secondary | ICD-10-CM

## 2022-10-05 DIAGNOSIS — J9601 Acute respiratory failure with hypoxia: Secondary | ICD-10-CM | POA: Diagnosis not present

## 2022-10-05 DIAGNOSIS — I672 Cerebral atherosclerosis: Secondary | ICD-10-CM | POA: Diagnosis not present

## 2022-10-05 DIAGNOSIS — R509 Fever, unspecified: Secondary | ICD-10-CM | POA: Diagnosis not present

## 2022-10-05 DIAGNOSIS — Z515 Encounter for palliative care: Secondary | ICD-10-CM

## 2022-10-05 DIAGNOSIS — T45615A Adverse effect of thrombolytic drugs, initial encounter: Secondary | ICD-10-CM | POA: Diagnosis present

## 2022-10-05 DIAGNOSIS — R0600 Dyspnea, unspecified: Secondary | ICD-10-CM | POA: Diagnosis not present

## 2022-10-05 DIAGNOSIS — H0520 Unspecified exophthalmos: Secondary | ICD-10-CM | POA: Diagnosis present

## 2022-10-05 DIAGNOSIS — I639 Cerebral infarction, unspecified: Secondary | ICD-10-CM | POA: Diagnosis not present

## 2022-10-05 DIAGNOSIS — Z881 Allergy status to other antibiotic agents status: Secondary | ICD-10-CM

## 2022-10-05 DIAGNOSIS — Z981 Arthrodesis status: Secondary | ICD-10-CM

## 2022-10-05 DIAGNOSIS — K5900 Constipation, unspecified: Secondary | ICD-10-CM | POA: Diagnosis not present

## 2022-10-05 DIAGNOSIS — G8194 Hemiplegia, unspecified affecting left nondominant side: Secondary | ICD-10-CM | POA: Diagnosis present

## 2022-10-05 DIAGNOSIS — R29810 Facial weakness: Secondary | ICD-10-CM | POA: Diagnosis not present

## 2022-10-05 DIAGNOSIS — I10 Essential (primary) hypertension: Secondary | ICD-10-CM | POA: Diagnosis not present

## 2022-10-05 DIAGNOSIS — R531 Weakness: Secondary | ICD-10-CM | POA: Diagnosis not present

## 2022-10-05 DIAGNOSIS — E0500 Thyrotoxicosis with diffuse goiter without thyrotoxic crisis or storm: Secondary | ICD-10-CM | POA: Diagnosis present

## 2022-10-05 DIAGNOSIS — I6523 Occlusion and stenosis of bilateral carotid arteries: Secondary | ICD-10-CM | POA: Diagnosis not present

## 2022-10-05 DIAGNOSIS — Z4659 Encounter for fitting and adjustment of other gastrointestinal appliance and device: Secondary | ICD-10-CM | POA: Diagnosis not present

## 2022-10-05 DIAGNOSIS — Z452 Encounter for adjustment and management of vascular access device: Secondary | ICD-10-CM | POA: Diagnosis not present

## 2022-10-05 DIAGNOSIS — R471 Dysarthria and anarthria: Secondary | ICD-10-CM | POA: Diagnosis present

## 2022-10-05 DIAGNOSIS — J449 Chronic obstructive pulmonary disease, unspecified: Secondary | ICD-10-CM | POA: Diagnosis present

## 2022-10-05 DIAGNOSIS — K219 Gastro-esophageal reflux disease without esophagitis: Secondary | ICD-10-CM | POA: Diagnosis present

## 2022-10-05 DIAGNOSIS — E876 Hypokalemia: Secondary | ICD-10-CM | POA: Diagnosis not present

## 2022-10-05 DIAGNOSIS — I615 Nontraumatic intracerebral hemorrhage, intraventricular: Secondary | ICD-10-CM | POA: Diagnosis not present

## 2022-10-05 DIAGNOSIS — I4892 Unspecified atrial flutter: Secondary | ICD-10-CM | POA: Diagnosis not present

## 2022-10-05 DIAGNOSIS — Z9282 Status post administration of tPA (rtPA) in a different facility within the last 24 hours prior to admission to current facility: Secondary | ICD-10-CM | POA: Diagnosis not present

## 2022-10-05 DIAGNOSIS — R1310 Dysphagia, unspecified: Secondary | ICD-10-CM | POA: Diagnosis present

## 2022-10-05 DIAGNOSIS — D696 Thrombocytopenia, unspecified: Secondary | ICD-10-CM | POA: Diagnosis present

## 2022-10-05 DIAGNOSIS — J690 Pneumonitis due to inhalation of food and vomit: Secondary | ICD-10-CM

## 2022-10-05 DIAGNOSIS — I609 Nontraumatic subarachnoid hemorrhage, unspecified: Secondary | ICD-10-CM | POA: Diagnosis not present

## 2022-10-05 DIAGNOSIS — E878 Other disorders of electrolyte and fluid balance, not elsewhere classified: Secondary | ICD-10-CM | POA: Diagnosis present

## 2022-10-05 DIAGNOSIS — Z9049 Acquired absence of other specified parts of digestive tract: Secondary | ICD-10-CM

## 2022-10-05 DIAGNOSIS — E870 Hyperosmolality and hypernatremia: Secondary | ICD-10-CM | POA: Diagnosis not present

## 2022-10-05 DIAGNOSIS — Z825 Family history of asthma and other chronic lower respiratory diseases: Secondary | ICD-10-CM

## 2022-10-05 DIAGNOSIS — R4182 Altered mental status, unspecified: Secondary | ICD-10-CM | POA: Diagnosis not present

## 2022-10-05 DIAGNOSIS — R519 Headache, unspecified: Secondary | ICD-10-CM | POA: Diagnosis not present

## 2022-10-05 DIAGNOSIS — E785 Hyperlipidemia, unspecified: Secondary | ICD-10-CM | POA: Diagnosis present

## 2022-10-05 DIAGNOSIS — Z888 Allergy status to other drugs, medicaments and biological substances status: Secondary | ICD-10-CM

## 2022-10-05 DIAGNOSIS — R0603 Acute respiratory distress: Secondary | ICD-10-CM | POA: Diagnosis not present

## 2022-10-05 DIAGNOSIS — I480 Paroxysmal atrial fibrillation: Secondary | ICD-10-CM | POA: Diagnosis not present

## 2022-10-05 DIAGNOSIS — I6389 Other cerebral infarction: Secondary | ICD-10-CM | POA: Diagnosis not present

## 2022-10-05 DIAGNOSIS — Z7982 Long term (current) use of aspirin: Secondary | ICD-10-CM

## 2022-10-05 DIAGNOSIS — Z8249 Family history of ischemic heart disease and other diseases of the circulatory system: Secondary | ICD-10-CM

## 2022-10-05 DIAGNOSIS — Z882 Allergy status to sulfonamides status: Secondary | ICD-10-CM

## 2022-10-05 DIAGNOSIS — G935 Compression of brain: Secondary | ICD-10-CM | POA: Diagnosis not present

## 2022-10-05 DIAGNOSIS — A419 Sepsis, unspecified organism: Secondary | ICD-10-CM | POA: Diagnosis not present

## 2022-10-05 DIAGNOSIS — Z9911 Dependence on respirator [ventilator] status: Secondary | ICD-10-CM | POA: Diagnosis not present

## 2022-10-05 DIAGNOSIS — G9349 Other encephalopathy: Secondary | ICD-10-CM | POA: Diagnosis not present

## 2022-10-05 DIAGNOSIS — J9690 Respiratory failure, unspecified, unspecified whether with hypoxia or hypercapnia: Secondary | ICD-10-CM | POA: Diagnosis not present

## 2022-10-05 DIAGNOSIS — Z8615 Personal history of latent tuberculosis infection: Secondary | ICD-10-CM

## 2022-10-05 DIAGNOSIS — R29718 NIHSS score 18: Secondary | ICD-10-CM | POA: Diagnosis present

## 2022-10-05 DIAGNOSIS — G911 Obstructive hydrocephalus: Secondary | ICD-10-CM | POA: Diagnosis not present

## 2022-10-05 DIAGNOSIS — Z885 Allergy status to narcotic agent status: Secondary | ICD-10-CM

## 2022-10-05 LAB — PROTIME-INR
INR: 0.9 (ref 0.8–1.2)
INR: 1 (ref 0.8–1.2)
Prothrombin Time: 12.5 seconds (ref 11.4–15.2)
Prothrombin Time: 13.5 seconds (ref 11.4–15.2)

## 2022-10-05 LAB — COMPREHENSIVE METABOLIC PANEL
ALT: 16 U/L (ref 0–44)
AST: 20 U/L (ref 15–41)
Albumin: 3.9 g/dL (ref 3.5–5.0)
Alkaline Phosphatase: 63 U/L (ref 38–126)
Anion gap: 7 (ref 5–15)
BUN: 19 mg/dL (ref 8–23)
CO2: 24 mmol/L (ref 22–32)
Calcium: 8.7 mg/dL — ABNORMAL LOW (ref 8.9–10.3)
Chloride: 104 mmol/L (ref 98–111)
Creatinine, Ser: 1.22 mg/dL (ref 0.61–1.24)
GFR, Estimated: 59 mL/min — ABNORMAL LOW (ref >60)
Glucose, Bld: 103 mg/dL — ABNORMAL HIGH (ref 70–99)
Potassium: 3.9 mmol/L (ref 3.5–5.1)
Sodium: 135 mmol/L (ref 135–145)
Total Bilirubin: 0.9 mg/dL (ref 0.3–1.2)
Total Protein: 6.9 g/dL (ref 6.5–8.1)

## 2022-10-05 LAB — CBC
HCT: 45.3 % (ref 39.0–52.0)
HCT: 43.6 % (ref 39.0–52.0)
Hemoglobin: 15.1 g/dL (ref 13.0–17.0)
Hemoglobin: 14.3 g/dL (ref 13.0–17.0)
MCH: 31.1 pg (ref 26.0–34.0)
MCH: 30.9 pg (ref 26.0–34.0)
MCHC: 33.3 g/dL (ref 30.0–36.0)
MCHC: 32.8 g/dL (ref 30.0–36.0)
MCV: 93.2 fL (ref 80.0–100.0)
MCV: 94.2 fL (ref 80.0–100.0)
Platelets: 131 10*3/uL — ABNORMAL LOW (ref 150–400)
Platelets: 126 10*3/uL — ABNORMAL LOW (ref 150–400)
RBC: 4.86 MIL/uL (ref 4.22–5.81)
RBC: 4.63 MIL/uL (ref 4.22–5.81)
RDW: 13.3 % (ref 11.5–15.5)
RDW: 13.2 % (ref 11.5–15.5)
WBC: 6.6 10*3/uL (ref 4.0–10.5)
WBC: 6.7 10*3/uL (ref 4.0–10.5)
nRBC: 0 % (ref 0.0–0.2)
nRBC: 0 % (ref 0.0–0.2)

## 2022-10-05 LAB — DIFFERENTIAL
Abs Immature Granulocytes: 0.01 10*3/uL (ref 0.00–0.07)
Basophils Absolute: 0.1 10*3/uL (ref 0.0–0.1)
Basophils Relative: 1 %
Eosinophils Absolute: 0.1 10*3/uL (ref 0.0–0.5)
Eosinophils Relative: 2 %
Immature Granulocytes: 0 %
Lymphocytes Relative: 22 %
Lymphs Abs: 1.4 10*3/uL (ref 0.7–4.0)
Monocytes Absolute: 0.5 10*3/uL (ref 0.1–1.0)
Monocytes Relative: 7 %
Neutro Abs: 4.5 10*3/uL (ref 1.7–7.7)
Neutrophils Relative %: 68 %

## 2022-10-05 LAB — TYPE AND SCREEN
ABO/RH(D): B POS
Antibody Screen: NEGATIVE

## 2022-10-05 LAB — ABO/RH: ABO/RH(D): B POS

## 2022-10-05 LAB — I-STAT CHEM 8, ED
BUN: 20 mg/dL (ref 8–23)
Calcium, Ion: 1.09 mmol/L — ABNORMAL LOW (ref 1.15–1.40)
Chloride: 107 mmol/L (ref 98–111)
Creatinine, Ser: 1.2 mg/dL (ref 0.61–1.24)
Glucose, Bld: 102 mg/dL — ABNORMAL HIGH (ref 70–99)
HCT: 44 % (ref 39.0–52.0)
Hemoglobin: 15 g/dL (ref 13.0–17.0)
Potassium: 4.1 mmol/L (ref 3.5–5.1)
Sodium: 140 mmol/L (ref 135–145)
TCO2: 24 mmol/L (ref 22–32)

## 2022-10-05 LAB — RAPID URINE DRUG SCREEN, HOSP PERFORMED
Amphetamines: NOT DETECTED
Barbiturates: NOT DETECTED
Benzodiazepines: NOT DETECTED
Cocaine: NOT DETECTED
Opiates: NOT DETECTED
Tetrahydrocannabinol: NOT DETECTED

## 2022-10-05 LAB — PREPARE CRYOPRECIPITATE: Unit division: 0

## 2022-10-05 LAB — URINALYSIS, ROUTINE W REFLEX MICROSCOPIC
Bacteria, UA: NONE SEEN
Bilirubin Urine: NEGATIVE
Glucose, UA: NEGATIVE mg/dL
Ketones, ur: NEGATIVE mg/dL
Leukocytes,Ua: NEGATIVE
Nitrite: NEGATIVE
Protein, ur: NEGATIVE mg/dL
Specific Gravity, Urine: 1.018 (ref 1.005–1.030)
pH: 7 (ref 5.0–8.0)

## 2022-10-05 LAB — ETHANOL: Alcohol, Ethyl (B): <10 mg/dL (ref <10)

## 2022-10-05 LAB — SODIUM: Sodium: 134 mmol/L — ABNORMAL LOW (ref 135–145)

## 2022-10-05 LAB — BPAM CRYOPRECIPITATE
Blood Product Expiration Date: 202405020035
ISSUE DATE / TIME: 202405011841

## 2022-10-05 LAB — FIBRINOGEN: Fibrinogen: 351 mg/dL (ref 210–475)

## 2022-10-05 LAB — APTT
aPTT: 22 seconds — ABNORMAL LOW (ref 24–36)
aPTT: 25 seconds (ref 24–36)

## 2022-10-05 LAB — MRSA NEXT GEN BY PCR, NASAL: MRSA by PCR Next Gen: NOT DETECTED

## 2022-10-05 LAB — CBG MONITORING, ED: Glucose-Capillary: 96 mg/dL (ref 70–99)

## 2022-10-05 MED ORDER — ONDANSETRON HCL 4 MG/2ML IJ SOLN
INTRAMUSCULAR | Status: AC
  Filled 2022-10-05: qty 2

## 2022-10-05 MED ORDER — CLEVIDIPINE BUTYRATE 0.5 MG/ML IV EMUL
0.0000 mg/h | INTRAVENOUS | Status: DC
  Administered 2022-10-05: 8 mg/h via INTRAVENOUS
  Administered 2022-10-05: 2 mg/h via INTRAVENOUS
  Administered 2022-10-06: 3 mg/h via INTRAVENOUS
  Filled 2022-10-05: qty 100
  Filled 2022-10-05 (×2): qty 50

## 2022-10-05 MED ORDER — ACETAMINOPHEN 160 MG/5ML PO SOLN
650.0000 mg | ORAL | Status: DC | PRN
  Administered 2022-10-08 – 2022-10-12 (×14): 650 mg
  Filled 2022-10-05 (×13): qty 20.3

## 2022-10-05 MED ORDER — SENNOSIDES-DOCUSATE SODIUM 8.6-50 MG PO TABS
1.0000 | ORAL_TABLET | Freq: Two times a day (BID) | ORAL | Status: DC
  Administered 2022-10-06 – 2022-10-07 (×3): 1 via ORAL
  Filled 2022-10-05 (×4): qty 1

## 2022-10-05 MED ORDER — SODIUM CHLORIDE 3 % IV SOLN
INTRAVENOUS | Status: DC
  Filled 2022-10-05 (×8): qty 500

## 2022-10-05 MED ORDER — STROKE: EARLY STAGES OF RECOVERY BOOK: Freq: Once | Status: AC

## 2022-10-05 MED ORDER — SODIUM CHLORIDE 0.9 % IV SOLN: Freq: Once | INTRAVENOUS | Status: DC

## 2022-10-05 MED ORDER — ACETAMINOPHEN 650 MG RE SUPP
650.0000 mg | RECTAL | Status: DC | PRN
  Administered 2022-10-06: 650 mg via RECTAL
  Filled 2022-10-05: qty 1

## 2022-10-05 MED ORDER — TRANEXAMIC ACID-NACL 1000-0.7 MG/100ML-% IV SOLN
1000.0000 mg | INTRAVENOUS | Status: AC
  Administered 2022-10-05: 1000 mg via INTRAVENOUS
  Filled 2022-10-05: qty 100

## 2022-10-05 MED ORDER — ONDANSETRON HCL 4 MG/2ML IJ SOLN
4.0000 mg | Freq: Once | INTRAMUSCULAR | Status: AC
  Administered 2022-10-05: 4 mg via INTRAVENOUS

## 2022-10-05 MED ORDER — LABETALOL HCL 5 MG/ML IV SOLN
INTRAVENOUS | Status: AC
  Administered 2022-10-05: 10 mg
  Filled 2022-10-05: qty 4

## 2022-10-05 MED ORDER — ACETAMINOPHEN 325 MG PO TABS
650.0000 mg | ORAL_TABLET | ORAL | Status: DC | PRN
  Administered 2022-10-07 (×4): 650 mg via ORAL
  Filled 2022-10-05 (×5): qty 2

## 2022-10-05 MED ORDER — PANTOPRAZOLE SODIUM 40 MG IV SOLR
40.0000 mg | Freq: Every day | INTRAVENOUS | Status: DC
  Administered 2022-10-05 – 2022-10-11 (×7): 40 mg via INTRAVENOUS
  Filled 2022-10-05 (×7): qty 10

## 2022-10-05 MED ORDER — IPRATROPIUM-ALBUTEROL 0.5-2.5 (3) MG/3ML IN SOLN
3.0000 mL | RESPIRATORY_TRACT | Status: DC | PRN
  Administered 2022-10-08 – 2022-10-12 (×2): 3 mL via RESPIRATORY_TRACT
  Filled 2022-10-05 (×2): qty 3

## 2022-10-05 MED ORDER — TENECTEPLASE FOR STROKE
PACK | INTRAVENOUS | Status: AC
  Administered 2022-10-05: 20 mg via INTRAVENOUS
  Filled 2022-10-05: qty 10

## 2022-10-05 MED ORDER — IOHEXOL 350 MG/ML SOLN
75.0000 mL | Freq: Once | INTRAVENOUS | Status: AC | PRN
  Administered 2022-10-05: 75 mL via INTRAVENOUS

## 2022-10-05 MED ORDER — TENECTEPLASE FOR STROKE: 0.2500 mg/kg | PACK | Freq: Once | INTRAVENOUS | Status: AC

## 2022-10-05 MED ORDER — CLEVIDIPINE BUTYRATE 0.5 MG/ML IV EMUL
0.0000 mg/h | INTRAVENOUS | Status: DC
  Filled 2022-10-05: qty 50

## 2022-10-05 NOTE — ED Provider Notes (Signed)
Pt's nurse told me that he developed a headache shortly after 5 pm.  A CT scan was ordered stat.  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  Pt taken directly to the CT scanner.  Unfortunately, the CT scan showed a large ICH.  CT scan reviewed by me.  I agree with the radiologist.  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 Subarachnoid blood products are also  seen in the right Sylvian fissure. (Class 2 - 3C)  2. Mass effect on the right lateral ventricular system which is  otherwise unchanged in size. No significant midline shift.    Dr. Otelia Limes notified.  BP elevated, so I ordered Cleviprex with SBP goals of 130-150.  Dr. Otelia Limes ordered TXA and cryoprecipitate.  I did obtain consent for these meds by talking to pt's wife, daughter, and grandson.  Pt remains awake and alert and is stable for transfer to [facility].  CRITICAL CARE Performed by: Jacalyn Lefevre   Total critical care time: 45 minutes  Critical care time was exclusive of separately billable procedures and treating other patients.  Critical care was necessary to treat or prevent imminent or life-threatening deterioration.  Critical care was time spent personally by me on the following activities: development of treatment plan with patient and/or surrogate as well as nursing, discussions with consultants, evaluation of patient's response to treatment, examination of patient, obtaining history from patient or surrogate, ordering and performing treatments and interventions, ordering and review of laboratory studies, ordering and review of radiographic studies, pulse oximetry and re-evaluation of patient's condition.      Jacalyn Lefevre, MD 10/05/22 2332
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
## 2022-10-05 NOTE — ED Provider Notes (Signed)
[place] EMERGENCY DEPARTMENT AT [facility] Provider Note   CSN: [id number] Arrival date & time: 10/05/22  1359     History  Chief Complaint  Patient presents with   Code Stroke    Mario Smith is a 82 y.o. male.  He is brought in as a code stroke activation from home by EMS.  Reportedly at 1230 patient experienced left facial droop slurred speech.  EMS states symptoms present on their arrival although seems somewhat better during transport.  Patient denies any complaints.  He does not feel numb or weak anywhere no blurry vision double vision headache chest pain shortness of breath.  The history is provided by the patient and the EMS personnel.  Cerebrovascular Accident This is a new problem. The current episode started 1 to 2 hours ago. The problem has been gradually improving. Pertinent negatives include no chest pain, no abdominal pain, no headaches and no shortness of breath. Nothing aggravates the symptoms. Nothing relieves the symptoms. He has tried nothing for the symptoms. The treatment provided no relief.       Home Medications Prior to Admission medications   Medication Sig Start Date End Date Taking? Authorizing Provider  albuterol (VENTOLIN HFA) 108 (90 Base) MCG/ACT inhaler Inhale 2 puffs into the lungs every 6 (six) hours as needed for wheezing or shortness of breath. 06/26/20   Gabriel Earing, FNP  aspirin 81 MG EC tablet Take by mouth.    [provider]  betamethasone, augmented, (DIPROLENE) 0.05 % lotion SMARTSIG:Sparingly Topical Every Evening PRN 03/22/21   [provider]  carboxymethylcellulose (REFRESH PLUS) 0.5 % SOLN Place 1 drop into both eyes as needed (dry eyes).    [provider]  carvedilol (COREG) 6.25 MG tablet Take 1 tablet (6.25 mg total) by mouth 2 (two) times daily with a meal. 08/26/22   Dettinger, Elige Radon, MD  Cholecalciferol (VITAMIN D3) 2000 UNITS TABS Take 2,000 Units by mouth daily.    [provider]  Coenzyme Q10 (CO Q 10) 100 MG CAPS Take 100 mg by mouth daily.    [provider]  diclofenac Sodium (VOLTAREN) 1 % GEL Apply 2 g topically 4 (four) times daily. 12/24/21   Dettinger, Elige Radon, MD  fluticasone (FLONASE) 50 MCG/ACT nasal spray SPRAY 2 SPRAYS INTO EACH NOSTRIL EVERY DAY 08/25/21   Dettinger, Elige Radon, MD  methimazole (TAPAZOLE) 5 MG tablet Take 1 tablet (5 mg total) by mouth 2 (two) times a week. 12/27/21   Dettinger, Elige Radon, MD  Omega-3 Fatty Acids (FISH OIL) 1000 MG CAPS Take 1,000 mg by mouth daily.    [provider]  pantoprazole (PROTONIX) 40 MG tablet Take 1 tablet (40 mg total) by mouth 2 (two) times daily. 12/24/21   Dettinger, Elige Radon, MD  rosuvastatin (CRESTOR) 10 MG tablet Take 1 tablet (10 mg total) by mouth daily. 12/24/21   Dettinger, Elige Radon, MD  tamsulosin (FLOMAX) 0.4 MG CAPS capsule Take 1 capsule (0.4 mg total) by mouth daily. 12/24/21   Dettinger, Elige Radon, MD  telmisartan (MICARDIS) 40 MG tablet Take 0.5 tablets (20 mg total) by mouth daily. 12/24/21   Dettinger, Elige Radon, MD      Allergies    Simvastatin, Sulfa antibiotics, Codeine, Doxycycline, Hydrocodone, Meclizine, Niacin and related, Ondansetron, and Phenergan [promethazine hcl]    Review of Systems   Review of Systems  Constitutional:  Negative for fever.  HENT:  Negative for trouble swallowing.   Eyes:  Negative for visual disturbance.  Respiratory:  Negative for shortness of breath.   Cardiovascular:  Negative for chest pain.  Gastrointestinal:  Negative for abdominal pain.  Neurological:  Negative for speech difficulty, weakness and headaches.    Physical Exam Updated Vital Signs BP (!) 151/99   Pulse 61   Temp 97.7 F (36.5 C)   Resp 14   Wt 78.8 kg   SpO2 96%   BMI 27.21 kg/m  Physical Exam Vitals and nursing note reviewed.  Constitutional:      General: He is not in acute distress.    Appearance: Normal appearance. He is well-developed.  HENT:      Head: Normocephalic and atraumatic.  Eyes:     Conjunctiva/sclera: Conjunctivae normal.  Cardiovascular:     Rate and Rhythm: Normal rate and regular rhythm.     Heart sounds: No murmur heard. Pulmonary:     Effort: Pulmonary effort is normal. No respiratory distress.     Breath sounds: Normal breath sounds.  Abdominal:     Palpations: Abdomen is soft.     Tenderness: There is no abdominal tenderness. There is no guarding or rebound.  Musculoskeletal:        General: No deformity. Normal range of motion.     Cervical back: Neck supple.  Skin:    General: Skin is warm and dry.     Capillary Refill: Capillary refill takes less than 2 seconds.  Neurological:     Mental Status: He is alert.     Cranial Nerves: Cranial nerve deficit present.     Sensory: No sensory deficit.     Motor: No weakness.     Comments: Patient is awake and alert.  He does have some facial asymmetry with weakness on the left side.  His speech seems intact and intelligible.  His upper and lower extremity strength is 5 out of 5.  He has a resting tremor right upper extremity.     ED Results / Procedures / Treatments   Labs (all labs ordered are listed, but only abnormal results are displayed) Labs Reviewed  APTT - Abnormal; Notable for the following components:      Result Value   aPTT 22 (*)    All other components within normal limits  CBC - Abnormal; Notable for the following components:   Platelets 131 (*)    All other components within normal limits  COMPREHENSIVE METABOLIC PANEL - Abnormal; Notable for the following components:   Glucose, Bld 103 (*)    Calcium 8.7 (*)    GFR, Estimated 59 (*)    All other components within normal limits  URINALYSIS, ROUTINE W REFLEX MICROSCOPIC - Abnormal; Notable for the following components:   Color, Urine STRAW (*)    Hgb urine dipstick SMALL (*)    All other components within normal limits  I-STAT CHEM 8, ED - Abnormal; Notable for the following  components:   Glucose, Bld 102 (*)    Calcium, Ion 1.09 (*)    All other components within normal limits  ETHANOL  PROTIME-INR  DIFFERENTIAL  RAPID URINE DRUG SCREEN, HOSP PERFORMED  CBG MONITORING, ED    EKG EKG Interpretation  Date/Time:  Wednesday Oct 05 2022 14:19:43 EDT Ventricular Rate:  67 PR Interval:    QRS Duration: 107 QT Interval:  417 QTC Calculation: 441 R Axis:   25 Text Interpretation: Normal sinus rhythm RSR' in V1 or V2, probably normal variant Borderline ST elevation, anterior leads No significant change  since prior 8/19 Confirmed by Meridee Score [phone]) on 10/05/2022 2:23:35 PM  Radiology CT ANGIO HEAD NECK W WO CM (CODE STROKE)  Result Date: 10/05/2022 CLINICAL DATA:  Neuro deficit, acute, stroke suspected EXAM: CT ANGIOGRAPHY HEAD AND NECK WITH AND WITHOUT CONTRAST TECHNIQUE: Multidetector CT imaging of the head and neck was performed using the standard protocol during bolus administration of intravenous contrast. Multiplanar CT image reconstructions and MIPs were obtained to evaluate the vascular anatomy. Carotid stenosis measurements (when applicable) are obtained utilizing NASCET criteria, using the distal internal carotid diameter as the denominator. RADIATION DOSE REDUCTION: This exam was performed according to the departmental dose-optimization program which includes automated exposure control, adjustment of the mA and/or kV according to patient size and/or use of iterative reconstruction technique. CONTRAST:  75mL OMNIPAQUE IOHEXOL 350 MG/ML SOLN COMPARISON:  Same day CT head. FINDINGS: CTA NECK FINDINGS Aortic arch: Great vessel origins are patent without significant stenosis. Right carotid system: Atherosclerosis at the carotid bifurcation without greater than 50% stenosis. Left carotid system: Atherosclerosis at the carotid bifurcation without greater than 50% stenosis. Vertebral arteries: Mild atherosclerotic narrowing of the right vertebral artery origin.  Otherwise, vertebral arteries are patent without significant stenosis. Skeleton: C5-C7 ACDF, solid. Other neck: No acute abnormality on limited assessment. Upper chest: Visualized lung apices are clear. Review of the MIP images confirms the above findings CTA HEAD FINDINGS Anterior circulation: Bilateral intracranial ICAs, MCAs, and ACAs are patent without proximal hemodynamically significant stenosis. Posterior circulation: Bilateral intradural vertebral arteries, basilar artery and bilateral posterior cerebral arteries are patent without proximal hemodynamically significant stenosis. Venous sinuses: As permitted by contrast timing, patent. Review of the MIP images confirms the above findings IMPRESSION: No large vessel occlusion or proximal hemodynamically significant stenosis. Findings discussed with Dr. Otelia Limes via telephone at 3:27 PM. Electronically Signed   By: Feliberto Harts M.D.   On: 10/05/2022 15:29   CT HEAD CODE STROKE WO CONTRAST  Result Date: 10/05/2022 CLINICAL DATA:  Code stroke. Neuro deficit, acute, stroke suspected l face droop EXAM: CT HEAD WITHOUT CONTRAST TECHNIQUE: Contiguous axial images were obtained from the base of the skull through the vertex without intravenous contrast. RADIATION DOSE REDUCTION: This exam was performed according to the departmental dose-optimization program which includes automated exposure control, adjustment of the mA and/or kV according to patient size and/or use of iterative reconstruction technique. COMPARISON:  None Available. FINDINGS: Brain: Question subtle loss of gray differentiation in the high right frontal lobe. No acute hemorrhage, hydrocephalus, extra-axial collection or mass lesion/mass effect. Atrophy. Patchy T2/FLAIR hyperintensities in the white matter, nonspecific but compatible with chronic microvascular ischemic change. Remote right parietal cortical infarct. Vascular: No hyperdense vessel identified. Skull: No acute fracture.  Sinuses/Orbits: Clear sinuses.  No acute orbital findings. Other: No mastoid effusions. ASPECTS [facility] Stroke Program Early CT Score) total score (0-10 with 10 being normal): 9 IMPRESSION: 1. Question subtle loss of gray differentiation in the high right frontal lobe. This could reflect a small acute or subacute infarct or artifact. Consider MRI for more sensitive evaluation. ASPECTS is 9 at worst. 2. No acute hemorrhage. 3. Chronic microvascular ischemic change. Code stroke imaging results were communicated on 10/05/2022 at 2:12 pm to provider Dr. Charm Barges via telephone, who verbally acknowledged these results. Electronically Signed   By: Feliberto Harts M.D.   On: 10/05/2022 14:13    Procedures .Critical Care  Performed by: Terrilee Files, MD Authorized by: Terrilee Files, MD   Critical care provider statement:  Critical care time (minutes):  45   Critical care time was exclusive of:  Separately billable procedures and treating other patients   Critical care was necessary to treat or prevent imminent or life-threatening deterioration of the following conditions:  CNS failure or compromise   Critical care was time spent personally by me on the following activities:  Development of treatment plan with patient or surrogate, discussions with consultants, evaluation of patient's response to treatment, examination of patient, obtaining history from patient or surrogate, ordering and performing treatments and interventions, ordering and review of laboratory studies, ordering and review of radiographic studies, pulse oximetry, re-evaluation of patient's condition and review of old charts   I assumed direction of critical care for this patient from another provider in my specialty: no       Medications Ordered in ED Medications  clevidipine (CLEVIPREX) infusion 0.5 mg/mL (has no administration in time range)  tenecteplase (TNKASE) injection for Stroke 20 mg (20 mg Intravenous Given 10/05/22 1501)   labetalol (NORMODYNE) 5 MG/ML injection (10 mg  Given 10/05/22 1500)  iohexol (OMNIPAQUE) 350 MG/ML injection 75 mL (75 mLs Intravenous Contrast Given 10/05/22 1511)    ED Course/ Medical Decision Making/ A&P Clinical Course as of 10/05/22 1650  Wed Oct 05, 2022  1500 Patient being evaluated by teleneuro Dr. Otelia Limes.  He is recommending proceeding with TNK.  Patient will need a CT angio head and neck and anticipated for admission to [facility].  Wife at bedside who was involved in the decision-making. [MB]    Clinical Course User Index [MB] Terrilee Files, MD                             Medical Decision Making Amount and/or Complexity of Data Reviewed Labs: ordered. Radiology: ordered.  Risk Decision regarding hospitalization.   This patient complains of acute left-sided weakness; this involves an extensive number of treatment Options and is a complaint that carries with it a high risk of complications and morbidity. The differential includes stroke, bleed, mass, hypoglycemia, seizure, metabolic derangement  I ordered, reviewed and interpreted labs, which included CBC with normal white count mildly low platelets, chemistries fairly unremarkable, talk screen negative, urinalysis unremarkable, glucose normal I ordered imaging studies which included CT head CT angio head and neck and I independently    visualized and interpreted imaging which showed no acute LVO, possible stroke Additional history obtained from EMS and patient's wife Previous records obtained and reviewed in epic including prior ED visits I consulted teleneurology Dr. Otelia Limes and discussed lab and imaging findings and discussed disposition.  Cardiac monitoring reviewed, normal sinus rhythm Social determinants considered, no significant barriers Critical Interventions: Code stroke activation bedside evaluation and complex decision making, TNK ultimately given for likely stroke within window  After the interventions  stated above, I reevaluated the patient and found patient still to be with left facial droop Admission and further testing considered, patient will need admission to the hospital down at [facility] campus for further workup.  Patient in agreement with plan for admission.         Final Clinical Impression(s) / ED Diagnoses Final diagnoses:  Acute CVA (cerebrovascular accident) [facility]    Rx / DC Orders ED Discharge Orders     None         Terrilee Files, MD 10/05/22 [phone]

## 2022-10-05 NOTE — Progress Notes (Signed)
Cart re elerted at 1746  Per bedside RN at 1700 pt had a change in symptoms (R sided severe headache, L side flaccid).   Non con was repeated and shows "There is a 6.3 x 5.0 x 4.5 cm parenchymal hematoma centered in the right frontal lobe (approximately 70 cc), in the region of infarct questioned on same day CT head. Subarachnoid blood products are also seen in the right sylvian fissure. There is mass effect on the right lateral ventricular system which is otherwise unchanged in Size"  Dr. Otelia Limes made aware at 1750. TSRN disconnected from cart at 1817.

## 2022-10-05 NOTE — ED Notes (Signed)
Patient transported to CT 

## 2022-10-05 NOTE — ED Notes (Signed)
Skin tear to pt right elbow. Pt states fell in yard yesterday while praying weeds. Rewrapped pr arm with guaze.

## 2022-10-05 NOTE — ED Notes (Signed)
PT had sudden onset of headache at this time. MD made aware. Upon [facility] assessment pt flaccid on left side. Unable to move left or left leg. ED to room and escorted immediately to CT.

## 2022-10-05 NOTE — Progress Notes (Addendum)
Elert 1411  Pt already to CT and back at time of elert Dr. Otelia Limes paged 1413 Dr. Otelia Limes on screen 1418  mRS 0 LNW 1230 - pt was awake sitting on his couch when he noticed he could not move his left arm. Slurred speech noted but has since resolved. Pt has L facial droop at time of elert.   TNK 1501 Pt back to CT for CTA at 1511 Plan to transfer to [facility]

## 2022-10-05 NOTE — ED Triage Notes (Signed)
BIB EMS for left side facial droop and slurred speech. LKW 1230pm today. Pt alert and oriented. Per EMS symptoms resolved enroute to ED. Pt has right sided arm tremors on arrival that EMS states as new .

## 2022-10-05 NOTE — ED Notes (Signed)
[facility] on site to transport [facility] at this time. ]

## 2022-10-05 NOTE — ED Notes (Signed)
Pt reports he noticed he was unable to use his L arm normally and notified his wife around 1230p.  Pt's wife noted a L side facial droop and slurred speech.  Arm weakness resolved prior to EMS arrival at 1330.

## 2022-10-05 NOTE — H&P (Signed)
Neurology H&P  CC: Left sided weakness   History is obtained from: Family at bedside and chart review   HPI: Mario Smith is a 82 y.o. male with a past medical history significant for hypertension, hyperlipidemia, thrombocytopenia, Graves' disease with exophthalmos, Mnire's disease, hard of hearing at baseline uses hearing aids, cervical spine fusion, latent tuberculosis s/p treatment, COPD  He has been in his usual state of health, did have a mechanical fall yesterday without any significant injury while doing yard work.  Subsequently today he called his wife and reported that he was not feeling well, he had felt fine just slightly earlier before calling her.  She came to check on him and found he was having left-sided weakness for which EMS was activated and he came to the hospital as a code stroke.  He was evaluated by video and TNK was administered.  Subsequently developed a severe headache and was found to have hemorrhagic conversion which was reversed with trans examination acid as well as cryoprecipitate (10 units, 2 pooled units of 5 each),   LKW: 12:30 PM Thrombolytic given?: TNKase at [facility] @15 :01  Checklist of contraindications was reviewed and negative. Risks, benefits and alternatives were discussed by Dr. Otelia Limes via video neurology cart IA performed?: No, no LVO  Premorbid modified rankin scale:      1 - No significant disability. Able to carry out all usual activities, despite some symptoms (still doing yard work day prior to admission)  ROS: Unable to obtain due to altered mental status.   Past Medical History:  Diagnosis Date   Abdominal pain 10/10/2017   Arthritis    Back pain    Colon polyp 2011   tubular adenoma.  no HG dysplasia.     Constipation    COPD (chronic obstructive pulmonary disease) (HCC)    no per pt   Dehydration 10/10/2017   Dyspnea    with exertion   ED (erectile dysfunction)    Essential hypertension, benign    Gallbladder sludge 10/2017    GERD (gastroesophageal reflux disease)    acute gastritis   Graves disease    History of kidney stones    HTN (hypertension)    Other and unspecified hyperlipidemia    PONV (postoperative nausea and vomiting)    Pt's wife also said that when he had a nerve block in 2015 here, pt had a reaction immediately. 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- finished medication   Vertigo    Past Surgical History:  Procedure Laterality Date   CATARACT EXTRACTION Bilateral    CHOLECYSTECTOMY N/A 01/18/2018   Procedure: LAPAROSCOPIC CHOLECYSTECTOMY WITH INTRAOPERATIVE CHOLANGIOGRAM ERAS PATHWAY;  Surgeon: Griselda Miner, MD;  Location: [facility] OR;  Service: General;  Laterality: N/A;   COLONOSCOPY  03/2010    Dr Walker Kehr, [facility]. Higher risk screening study, sister's hx of colon CA.     ESOPHAGOGASTRODUODENOSCOPY (EGD) WITH PROPOFOL N/A 10/11/2017   Procedure: ESOPHAGOGASTRODUODENOSCOPY (EGD) WITH PROPOFOL;  Surgeon: Napoleon Form, MD;  Location: [facility];  Service: Endoscopy;  Laterality: N/A;   EYE SURGERY     lid-lift   SPINAL FUSION  2001   cervical   TENDON REPAIR Right 05/16/2014   Procedure: RIGHT RING FINGER FLEXOR TENDON REPAIR WITH PULL OUT BUTTON;  Surgeon: Dominica Severin, MD;  Location: [facility];  Service: Orthopedics;  Laterality: Right;   UPPER GASTROINTESTINAL ENDOSCOPY     Current Outpatient Medications  Medication Instructions   albuterol (VENTOLIN HFA) 108 (90 Base) MCG/ACT inhaler 2 puffs, Inhalation, Every 6 hours PRN   aspirin EC 81 mg, Oral, Daily   carvedilol (COREG) 6.25 mg, Oral, 2 times daily with meals   Co Q 10 100 mg, Oral, Daily   Fish Oil 1,000 mg, Oral, Daily,     methimazole (TAPAZOLE) 5 mg, Oral, 2 times weekly   pantoprazole (PROTONIX) 40 mg, Oral, 2 times daily   rosuvastatin (CRESTOR) 10 mg, Oral, Daily   tamsulosin  (FLOMAX) 0.4 mg, Oral, Daily   telmisartan (MICARDIS) 20 mg, Oral, Daily   Vitamin D3 2,000 Units, Oral, Daily,      Family History  Problem Relation Age of Onset   Heart disease Mother    COPD Mother    Heart disease Father    COPD Father    Alcohol abuse Father    Cancer Sister        colon cancer   Emphysema Sister    Colon cancer Sister    Cancer Brother        prostate   Emphysema Sister    Prostate cancer Brother    Healthy Daughter    Esophageal cancer Neg Hx    Stomach cancer Neg Hx    Rectal cancer Neg Hx     Social History:  reports that he quit smoking about 29 years ago. His smoking use included cigarettes. He quit smokeless tobacco use about 29 years ago.  His smokeless tobacco use included chew. He reports that he does not currently use alcohol. He reports that he does not use drugs.   Exam: Current vital signs: BP 128/60   Pulse 78   Temp 98.1 F (36.7 C) (Oral)   Resp 15   Wt 78.8 kg   SpO2 98%   BMI 27.21 kg/m  Vital signs in last 24 hours: Temp:  [97.7 F (36.5 C)-98.6 F (37 C)] 98.1 F (36.7 C) (05/01 1931) Pulse Rate:  [57-84] 78 (05/01 1931) Resp:  [13-20] 15 (05/01 1931) BP: (122-190)/(57-99) 128/60 (05/01 1931) SpO2:  [91 %-98 %] 98 % (05/01 1931) Weight:  [78.8 kg] 78.8 kg (05/01 1418)   Physical Exam  Constitutional: Appears well-developed and well-nourished, reports headache is improved Psych: Affect pleasant and cooperative Eyes: No significant scleral injection HENT: No oropharyngeal obstruction.  MSK: no major joint deformities. Cardiovascular: Perfusing extremities well Respiratory: Effort normal, non-labored breathing GI: Soft.  No distension. There is no tenderness.  Skin: Bandage in place over some skin wounds from recent fall  Neuro: Mental Status: Patient is awake, alert, oriented to person, place, month, year, though slightly confused about situation No signs of aphasia, but does have severe left-sided  neglect Cranial Nerves: II: Visual Fields are little left hemianopia. Pupils are round, with left possibly 0.5 mm larger than the right but both are reactive III,IV, VI: Intermittent left eye ptosis which is baseline per family V: Facial sensation is reduced on the left VII: Facial movement is notable for left facial droop.  VIII: hearing is intact to voice XII: tongue is midline without atrophy or fasciculations.  Motor: Tone is normal. Bulk is normal.  Slight flexion of the left upper extremity, triple flexion of the left lower extremity.  5/5 throughout the right side except for hip flexion which is 3/5 Sensory: Severe loss of sensation on the left side Deep Tendon  Reflexes: 2+ and symmetric patellae.  Plantars: Toes are downgoing on the right and upgoing on the left Cerebellar: FNF and HKS are intact on the right Gait:  Deferred  NIHSS total 18 Score breakdown: 1 point for drowsiness, 1 point for gaze palsy, 2 points for hemianopia, 2 points for facial droop, 3 points for left arm weakness, 3 points for left leg weakness, 1 point for right leg weakness, 2 points for sensory loss, 1 point for dysarthria, 2 points for neglect Performed at 8:15 PM   I have reviewed labs in epic and the results pertinent to this consultation are:  Basic Metabolic Panel: Recent Labs  Lab 10/05/22 1402 10/05/22 1409 10/05/22 1817  NA 135 140 134*  K 3.9 4.1  --   CL 104 107  --   CO2 24  --   --   GLUCOSE 103* 102*  --   BUN 19 20  --   CREATININE 1.22 1.20  --   CALCIUM 8.7*  --   --     CBC: Recent Labs  Lab 10/05/22 1402 10/05/22 1409 10/05/22 1817  WBC 6.6  --  6.7  NEUTROABS 4.5  --   --   HGB 15.1 15.0 14.3  HCT 45.3 44.0 43.6  MCV 93.2  --  94.2  PLT 131*  --  126*    Coagulation Studies: Recent Labs    10/05/22 1402 10/05/22 1817  LABPROT 12.5 13.5  INR 0.9 1.0    No results found for: "HGBA1C"  Lab Results  Component Value Date   CHOL 132 08/24/2022   HDL  45 08/24/2022   LDLCALC 70 08/24/2022   TRIG 89 08/24/2022   CHOLHDL 2.9 08/24/2022     I have reviewed the images obtained:  Repeat Head CT 17:28 personally reviewed, agree with radiology:   1. 6.3 x 5.0 x 4.5 cm parenchymal hematoma centered in the right frontal lobe (approximately 70 cc), in the region of infarct questioned on same day CT head. Subarachnoid blood products are also seen in the right Sylvian fissure. (Class 2 - 3C) 2. Mass effect on the right lateral ventricular system which is otherwise unchanged in size. No significant midline shift.  Head CT Code stroke personally reviewed, agree with radiology:   1. Question subtle loss of gray differentiation in the high right frontal lobe. This could reflect a small acute or subacute infarct or artifact. Consider MRI for more sensitive evaluation. ASPECTS is 9 at worst. 2. No acute hemorrhage. 3. Chronic microvascular ischemic change.  CTA No large vessel occlusion or proximal hemodynamically significant stenosis.     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  Recommendations:  Assessment:   Plan: Cortical ICH, nontraumatic secondary to TNK  Acuity: Acute Laterality: Right Current suspected etiology: TNK, hemorrhagic conversion of ischemic stroke Treatment: -Admit to: ICU -ICH Score: 2  -ICH Volume: 70 cc  -BP control goal SYS 130-150 -PT/OT/ST  -neuromonitoring  CNS Cerebral edema -Hyperosmolar therapy  -NSGY consult if patient declines, and if the family indicates this would be within goals of care at the time -Close neuro monitoring  Dysarthria Dysphagia following ICH  -NPO until cleared by speech  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  -Continue PT/OT/ST  RESP Protecting airway -DNR/DNI confirmed with family with full medical interventions  CV Hypertensive Emergency Hypertensive  Urgency -Aggressive BP control, goal SBP 130-150 -Titrate oral agents when able to take PO -TTE  GI/GU 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) -Gentle hydration -avoid nephrotoxic agents  HEME  Coagulopathy secondary to thrombolytic s/p txa and cryo for reversal  -Appreciate monitoring per pharmacy   ENDO -goal HgbA1c < 7   Fluid/Electrolyte Disorders Mild Hypo Calcium -Trend -Check Mg  ID No active issues Check UA  Nutrition NPO pending swallow by SLP  Prophylaxis DVT: SCDs GI: Protonix Bowel: Senna  Dispo: Pending clinical course  Diet: NPO until cleared by speech  Code Status: DNR/DNI but full medical care   THE FOLLOWING WERE PRESENT ON ADMISSION: CNS -  Acute Ischemic Stroke Cerebral Edema, ICH, Hemiparesis/Hemiplegia, Cardiovascular - Hypertensive Emergency Urgency,  Renal -  CKD stage IIA, hypertonic saline for ICH Heme-  Coagulopathy secondary to TNK at outside hospital DNI/DNR  Brooke Dare MD-PhD [facility] [phone] Available 7 PM to 7 AM, outside of these hours please call Neurologist on call as listed on Amion.   Total critical care time: 60 minutes   Critical care time was exclusive of separately billable procedures and treating other patients.   Critical care was necessary to treat or prevent imminent or life-threatening deterioration.   Critical care was time spent personally by me on the following activities: development of treatment plan with patient and/or surrogate as well as nursing, discussions with consultants/primary team, evaluation of patient's response to treatment, examination of patient, obtaining history from patient or surrogate, ordering and performing treatments and interventions, ordering and review of laboratory studies, ordering and review of radiographic studies, and re-evaluation of patient's condition as needed, as documented above.    And he was transferred to [facility] for further

## 2022-10-05 NOTE — ED Notes (Signed)
Pt wife took pt hearing aids and eye glasses.

## 2022-10-06 ENCOUNTER — Inpatient Hospital Stay (HOSPITAL_COMMUNITY): Payer: No Typology Code available for payment source

## 2022-10-06 ENCOUNTER — Other Ambulatory Visit: Payer: Self-pay

## 2022-10-06 DIAGNOSIS — I639 Cerebral infarction, unspecified: Secondary | ICD-10-CM | POA: Diagnosis not present

## 2022-10-06 DIAGNOSIS — G911 Obstructive hydrocephalus: Secondary | ICD-10-CM | POA: Diagnosis not present

## 2022-10-06 DIAGNOSIS — Z9282 Status post administration of tPA (rtPA) in a different facility within the last 24 hours prior to admission to current facility: Secondary | ICD-10-CM | POA: Diagnosis not present

## 2022-10-06 DIAGNOSIS — I615 Nontraumatic intracerebral hemorrhage, intraventricular: Secondary | ICD-10-CM | POA: Diagnosis not present

## 2022-10-06 DIAGNOSIS — J9811 Atelectasis: Secondary | ICD-10-CM | POA: Diagnosis not present

## 2022-10-06 DIAGNOSIS — I611 Nontraumatic intracerebral hemorrhage in hemisphere, cortical: Secondary | ICD-10-CM

## 2022-10-06 DIAGNOSIS — I6389 Other cerebral infarction: Secondary | ICD-10-CM

## 2022-10-06 DIAGNOSIS — I480 Paroxysmal atrial fibrillation: Secondary | ICD-10-CM | POA: Diagnosis not present

## 2022-10-06 DIAGNOSIS — I619 Nontraumatic intracerebral hemorrhage, unspecified: Secondary | ICD-10-CM | POA: Diagnosis not present

## 2022-10-06 DIAGNOSIS — I63411 Cerebral infarction due to embolism of right middle cerebral artery: Secondary | ICD-10-CM | POA: Diagnosis not present

## 2022-10-06 DIAGNOSIS — Z452 Encounter for adjustment and management of vascular access device: Secondary | ICD-10-CM | POA: Diagnosis not present

## 2022-10-06 LAB — BPAM CRYOPRECIPITATE
Blood Product Expiration Date: 202405052359
ISSUE DATE / TIME: 202405020143
ISSUE DATE / TIME: 202405020143
Unit Type and Rh: 5100
Unit Type and Rh: 6200

## 2022-10-06 LAB — PREPARE CRYOPRECIPITATE

## 2022-10-06 LAB — LIPID PANEL
Cholesterol: 130 mg/dL (ref 0–200)
HDL: 38 mg/dL — ABNORMAL LOW (ref >40)
LDL Cholesterol: 79 mg/dL (ref 0–99)
Total CHOL/HDL Ratio: 3.4 RATIO
Triglycerides: 64 mg/dL (ref <150)
VLDL: 13 mg/dL (ref 0–40)

## 2022-10-06 LAB — MAGNESIUM: Magnesium: 2.1 mg/dL (ref 1.7–2.4)

## 2022-10-06 LAB — SODIUM
Sodium: 142 mmol/L (ref 135–145)
Sodium: 144 mmol/L (ref 135–145)
Sodium: 146 mmol/L — ABNORMAL HIGH (ref 135–145)
Sodium: 150 mmol/L — ABNORMAL HIGH (ref 135–145)

## 2022-10-06 LAB — TYPE AND SCREEN
ABO/RH(D): B POS
Antibody Screen: NEGATIVE

## 2022-10-06 LAB — HEMOGLOBIN A1C
Hgb A1c MFr Bld: 5.4 % (ref 4.8–5.6)
Mean Plasma Glucose: 108.28 mg/dL

## 2022-10-06 LAB — ECHOCARDIOGRAM COMPLETE: Weight: 2779.56 oz

## 2022-10-06 LAB — ABO/RH: ABO/RH(D): B POS

## 2022-10-06 MED ORDER — SODIUM CHLORIDE 0.9% FLUSH: 10.0000 mL | INTRAVENOUS | Status: DC | PRN

## 2022-10-06 MED ORDER — METHIMAZOLE 5 MG PO TABS
5.0000 mg | ORAL_TABLET | ORAL | Status: DC
  Administered 2022-10-06: 5 mg via ORAL
  Filled 2022-10-06: qty 1

## 2022-10-06 MED ORDER — CHLORHEXIDINE GLUCONATE CLOTH 2 % EX PADS
6.0000 | MEDICATED_PAD | Freq: Every day | CUTANEOUS | Status: DC
  Administered 2022-10-06 – 2022-10-08 (×3): 6 via TOPICAL

## 2022-10-06 MED ORDER — SODIUM CHLORIDE 0.9 % IV SOLN: Freq: Once | INTRAVENOUS | Status: AC

## 2022-10-06 MED ORDER — SODIUM CHLORIDE 0.9% FLUSH
10.0000 mL | Freq: Two times a day (BID) | INTRAVENOUS | Status: DC
  Administered 2022-10-06: 10 mL

## 2022-10-06 MED ORDER — SODIUM CHLORIDE 3 % IV BOLUS
250.0000 mL | Freq: Once | INTRAVENOUS | Status: AC
  Administered 2022-10-06: 250 mL via INTRAVENOUS

## 2022-10-06 MED ORDER — LORAZEPAM 1 MG PO TABS
0.5000 mg | ORAL_TABLET | Freq: Once | ORAL | Status: AC | PRN
  Administered 2022-10-06: 0.5 mg via ORAL
  Filled 2022-10-06: qty 1

## 2022-10-06 MED ORDER — ORAL CARE MOUTH RINSE
15.0000 mL | OROMUCOSAL | Status: DC
  Administered 2022-10-07 – 2022-10-08 (×6): 15 mL via OROMUCOSAL

## 2022-10-06 MED ORDER — CARVEDILOL 3.125 MG PO TABS
6.2500 mg | ORAL_TABLET | Freq: Two times a day (BID) | ORAL | Status: DC
  Administered 2022-10-07 (×2): 6.25 mg via ORAL
  Filled 2022-10-06 (×3): qty 2

## 2022-10-06 MED ORDER — ORAL CARE MOUTH RINSE: 15.0000 mL | OROMUCOSAL | Status: DC | PRN

## 2022-10-06 NOTE — Hospital Course (Signed)
Family reports swallowing well, SLP to do barium next.  No headache, last time was yesterday. Nystagmus to right gaze on R side.Eyes do not cross midline to the L but do to the right. Pill rolling tremor on the R. L eye droop since surgery for graves exopthalmos. Lethargic.  Oriented to person, place, reason for admission, time. Family reports L facial droop yesterday. L neglect. Follows motor commandts to RUE and RLE. Holds R extremities against gravity. 2/5 strength to major joints LLE. Bilateral facial sensation. No sensation to LUE or LLE. Finger to nose good on R. LUE no movement to gravity. Only R hand shaking per family.Says he walked himself to the ambulance. Family thinks he is getting better. Before TNK laughing and talking but just facial droop. Still has L facial droop. Nurse says more lethargic today.

## 2022-10-06 NOTE — Progress Notes (Addendum)
STROKE TEAM PROGRESS NOTE   INTERVAL HISTORY His wife, daughter, son in law were at the bedside.  Moderate increase in Volume of R intraparenchymal hemorrhage from 75 to on CT this AM. Underlying ventricular size has not changed on interval CT although there was concern for potential hydrocephalus. Patient is lethargic but interactive in the room. Denies headache and A&Ox4. Patient denies headache. Family thinks things are improving with his speech and swallowing.    Vitals:   10/06/22 0615 10/06/22 0630 10/06/22 0645 10/06/22 0700  BP: 123/75 132/73 139/75 131/66  Pulse: 82 93 84 90  Resp: 16 (!) 47 18 16  Temp:      TempSrc:      SpO2:  97% 97% 97%  Weight:       CBC:  Recent Labs  Lab 10/05/22 1402 10/05/22 1409 10/05/22 1817  WBC 6.6  --  6.7  NEUTROABS 4.5  --   --   HGB 15.1 15.0 14.3  HCT 45.3 44.0 43.6  MCV 93.2  --  94.2  PLT 131*  --  126*   Basic Metabolic Panel:  Recent Labs  Lab 10/05/22 1402 10/05/22 1409 10/05/22 1817 10/05/22 2307 10/06/22 0617  NA 135 140   < > 142 144  K 3.9 4.1  --   --   --   CL 104 107  --   --   --   CO2 24  --   --   --   --   GLUCOSE 103* 102*  --   --   --   BUN 19 20  --   --   --   CREATININE 1.22 1.20  --   --   --   CALCIUM 8.7*  --   --   --   --   MG  --   --   --   --  2.1   < > = values in this interval not displayed.   Lipid Panel:  Recent Labs  Lab 10/06/22 0617  CHOL 130  TRIG 64  HDL 38*  CHOLHDL 3.4  VLDL 13  LDLCALC 79   HgbA1c:  Recent Labs  Lab 10/06/22 0617  HGBA1C 5.4   Urine Drug Screen:  Recent Labs  Lab 10/05/22 1553  LABOPIA NONE DETECTED  COCAINSCRNUR NONE DETECTED  LABBENZ NONE DETECTED  AMPHETMU NONE DETECTED  THCU NONE DETECTED  LABBARB NONE DETECTED    Alcohol Level  Recent Labs  Lab 10/05/22 1402  ETH <10    IMAGING past 24 hours CT HEAD WO CONTRAST ( )  Result Date: 10/06/2022 CLINICAL DATA:  Hemorrhagic stroke EXAM: CT HEAD WITHOUT CONTRAST TECHNIQUE:  Contiguous axial images were obtained from the base of the skull through the vertex without intravenous contrast. RADIATION DOSE REDUCTION: This exam was performed according to the departmental dose-optimization program which includes automated exposure control, adjustment of the mA and/or kV according to patient size and/or use of iterative reconstruction technique. COMPARISON:  10/05/2022 FINDINGS: Brain: Slight interval increase in the size of the right intraparenchymal hematoma, which now measures 7.1 x 4.0 x 5.3 cm (AP x TR x CC) (series 4, image 28 and series 6, image 45), with a volume of approximately 75 mm, with surrounding edema and mass effect on the right lateral ventricle. No significant midline shift. Redemonstrated subarachnoid hemorrhage in the right sylvian fissure and scattered right cerebral hemisphere sulci, as well as now in the left temporal sulci (series 4, image 22). Interval extension  into right greater than left lateral ventricles. Enlargement of the temporal and occipital horns, concerning for hydrocephalus. Increased hypodensity and loss of gray-white differentiation in the right posterior frontal lobe (series 4, image 32), consistent with of all vein infarct. Vascular: No hyperdense vessel. Skull: Negative for fracture or focal lesion. Sinuses/Orbits: Mild mucosal thickening in the paranasal sinuses. No acute finding in the orbits. Other: The mastoids are well aerated. IMPRESSION: 1. Slight interval increase in the size of the right intraparenchymal hematoma, with surrounding edema and mass effect on the right lateral ventricle. No significant midline shift. 2. Redemonstrated subarachnoid hemorrhage in the right Sylvian fissure and scattered right cerebral hemisphere sulci, as well as now in the left temporal sulci. 3. Interval extension into right-greater-than-left lateral ventricles, with enlargement of the temporal and occipital horns, concerning for hydrocephalus. 4. Increased  hypodensity and loss of gray-white differentiation in the right posterior frontal lobe, consistent with known infarct. These findings were discussed by telephone on 10/06/2022 at 1:11 am with provider BHAGAT. Electronically Signed   By: Wiliam Ke M.D.   On: 10/06/2022 01:13   CT Head Wo Contrast  Result Date: 10/05/2022 CLINICAL DATA:  Headache.  Sudden onset. EXAM: CT HEAD WITHOUT CONTRAST TECHNIQUE: Contiguous axial images were obtained from the base of the skull through the vertex without intravenous contrast. RADIATION DOSE REDUCTION: This exam was performed according to the departmental dose-optimization program which includes automated exposure control, adjustment of the mA and/or kV according to patient size and/or use of iterative reconstruction technique. COMPARISON:  Same day CT head. FINDINGS: Brain: There is a 6.3 x 5.0 x 4.5 cm parenchymal hematoma centered in the right frontal lobe (approximately 70 cc), in the region of infarct questioned on same day CT head. Subarachnoid blood products are also seen in the right sylvian fissure. There is mass effect on the right lateral ventricular system which is otherwise unchanged in size. There is no significant midline shift. No hydrocephalus. No intraventricular blood products. Sequela of moderate chronic microvascular ischemic change. Vascular: No hyperdense vessel or unexpected calcification. Skull: Normal. Negative for fracture or focal lesion. Sinuses/Orbits: No middle ear or mastoid effusion. Paranasal sinuses are clear. Bilateral lens replacement. Orbits are otherwise unremarkable. Other: None. IMPRESSION: 1. 6.3 x 5.0 x 4.5 cm parenchymal hematoma centered in the right frontal lobe (approximately 70 cc), in the region of infarct questioned on same day CT head. Subarachnoid blood products are also seen in the right Sylvian fissure. (Class 2 - 3C) 2. Mass effect on the right lateral ventricular system which is otherwise unchanged in size. No  significant midline shift. Findings were discussed with Dr. Particia Nearing on 10/05/22 at 5:42 PM. Electronically Signed   By: Lorenza Cambridge M.D.   On: 10/05/2022 17:43   CT ANGIO HEAD NECK W WO CM (CODE STROKE)  Result Date: 10/05/2022 CLINICAL DATA:  Neuro deficit, acute, stroke suspected EXAM: CT ANGIOGRAPHY HEAD AND NECK WITH AND WITHOUT CONTRAST TECHNIQUE: Multidetector CT imaging of the head and neck was performed using the standard protocol during bolus administration of intravenous contrast. Multiplanar CT image reconstructions and MIPs were obtained to evaluate the vascular anatomy. Carotid stenosis measurements (when applicable) are obtained utilizing NASCET criteria, using the distal internal carotid diameter as the denominator. RADIATION DOSE REDUCTION: This exam was performed according to the departmental dose-optimization program which includes automated exposure control, adjustment of the mA and/or kV according to patient size and/or use of iterative reconstruction technique. CONTRAST:  75mL OMNIPAQUE IOHEXOL 350 MG/ML SOLN  COMPARISON:  Same day CT head. FINDINGS: CTA NECK FINDINGS Aortic arch: Great vessel origins are patent without significant stenosis. Right carotid system: Atherosclerosis at the carotid bifurcation without greater than 50% stenosis. Left carotid system: Atherosclerosis at the carotid bifurcation without greater than 50% stenosis. Vertebral arteries: Mild atherosclerotic narrowing of the right vertebral artery origin. Otherwise, vertebral arteries are patent without significant stenosis. Skeleton: C5-C7 ACDF, solid. Other neck: No acute abnormality on limited assessment. Upper chest: Visualized lung apices are clear. Review of the MIP images confirms the above findings CTA HEAD FINDINGS Anterior circulation: Bilateral intracranial ICAs, MCAs, and ACAs are patent without proximal hemodynamically significant stenosis. Posterior circulation: Bilateral intradural vertebral arteries, basilar  artery and bilateral posterior cerebral arteries are patent without proximal hemodynamically significant stenosis. Venous sinuses: As permitted by contrast timing, patent. Review of the MIP images confirms the above findings IMPRESSION: No large vessel occlusion or proximal hemodynamically significant stenosis. Findings discussed with Dr. Otelia Limes via telephone at 3:27 PM. Electronically Signed   By: Feliberto Harts M.D.   On: 10/05/2022 15:29   CT HEAD CODE STROKE WO CONTRAST  Result Date: 10/05/2022 CLINICAL DATA:  Code stroke. Neuro deficit, acute, stroke suspected l face droop EXAM: CT HEAD WITHOUT CONTRAST TECHNIQUE: Contiguous axial images were obtained from the base of the skull through the vertex without intravenous contrast. RADIATION DOSE REDUCTION: This exam was performed according to the departmental dose-optimization program which includes automated exposure control, adjustment of the mA and/or kV according to patient size and/or use of iterative reconstruction technique. COMPARISON:  None Available. FINDINGS: Brain: Question subtle loss of gray differentiation in the high right frontal lobe. No acute hemorrhage, hydrocephalus, extra-axial collection or mass lesion/mass effect. Atrophy. Patchy T2/FLAIR hyperintensities in the white matter, nonspecific but compatible with chronic microvascular ischemic change. Remote right parietal cortical infarct. Vascular: No hyperdense vessel identified. Skull: No acute fracture. Sinuses/Orbits: Clear sinuses.  No acute orbital findings. Other: No mastoid effusions. ASPECTS [facility] Stroke Program Early CT Score) total score (0-10 with 10 being normal): 9 IMPRESSION: 1. Question subtle loss of gray differentiation in the high right frontal lobe. This could reflect a small acute or subacute infarct or artifact. Consider MRI for more sensitive evaluation. ASPECTS is 9 at worst. 2. No acute hemorrhage. 3. Chronic microvascular ischemic change. Code stroke imaging  results were communicated on 10/05/2022 at 2:12 pm to provider Dr. Charm Barges via telephone, who verbally acknowledged these results. Electronically Signed   By: Feliberto Harts M.D.   On: 10/05/2022 14:13    PHYSICAL EXAM  Temp:  [97.6 F (36.4 C)-99.6 F (37.6 C)] 98.3 F (36.8 C) (05/02 0400) Pulse Rate:  [57-108] 76 (05/02 1200) Resp:  [12-47] 12 (05/02 1200) BP: (105-190)/(48-99) 129/65 (05/02 1200) SpO2:  [91 %-100 %] 94 % (05/02 1200) Weight:  [78.8 kg] 78.8 kg (05/01 1418)  General - Well nourished, well developed,lethargic, no acute distress.  Mental Status -  Level of arousal and orientation to time, place, and person were intact x 3. Language including expression, naming, repetition, comprehension was assessed and found intact. Attention span and concentration were normal. Recent and remote memory were intact. Fund of Knowledge was assessed and was intact.  Cranial Nerves II - XII - II - Visual field intact OU. III, IV, VI - Eyes unable to cross midline on L gaze, other wise extraocular movements intact. V - Facial sensation intact bilaterally but reduced on the left. VII - Left lower facial droop, L ptosis. VIII - Hearing &  vestibular intact bilaterally. X - Palate elevates symmetrically. XI - Chin turning & shoulder shrug intact bilaterally. XII - Tongue protrusion intact.  Motor Strength -RUE strength normal and no pronator drift. Left lower extremity with 2/5 strength to all joints. 0/5 strength to all L upper extremity joints. Bulk was normal and fasciculations were absent.    Motor Tone - Muscle tone was assessed at the neck and appendages and was decreased in the left upper and lower extremities..  Reflexes - Deferred.  Sensory - Light touch was normal to the R upper and lower extremity and absent to the L upper and lower extremity.    Coordination - No dysmetria with the R hand, unable to participate with the L hand. Pill rolling tremor noted to the R hand.     Gait and Station - deferred.   ASSESSMENT/PLAN [name] is a 82 y.o. male with history of hypertension, hyperlipidemia, thrombocytopenia, Graves' disease with exophthalmos, Mnire's disease, hard of hearing at baseline uses hearing aids, cervical spine fusion, latent tuberculosis s/p treatment, COPD  presenting with ischemic stroke and secondary R cortical ICH secondary to TNK.   Stroke: right MCA infarct status post TNK complicated by large parenchymal hemorrhagic infarct with IVH, stroke embolic pattern, concerning for occult A-fib Code Stroke:CT head with subtle loss of gray-white differentiation in R frontal lobe. Chronic small vessel disease. Atrophy. Remote R parietal cortical infarct. ASPECTS 9.    CTA head & neck: No large vessel occlusion or proximal hemodynamically significant stenosis CT repeat for headache showed large parenchymal hematoma centered in the right frontal lobe, SAH seen in the right sylvian fissure.  Mass effect on right lateral ventricle CT repeat slight interval increase of right parenchymal hematoma and ICH, interval extension right > left IVH into lateral ventricles, enlargement of temporal and occipital horns, concerning for hydrocephalus.  Right posterior frontal infarct. CT repeat 5/2 stable parenchymal hematoma and IVH. MRI brain: Pending 2D Echo: Pending LDL 79 HgbA1c 5.4 VTE prophylaxis - SCD aspirin 81 mg daily prior to admission, now on No antithrombotic.  Therapy recommendations: Pending Disposition:  Pending  Cerebral edema CT showed large ICH with mass effect on the right ventricle but no significant midline shift On 3% saline @75  S/p 3% bolus x 1 Na monitoring Na 140->144->146  IVH, risk of hydrocephalus CT head concerning for developing hydrocephalus Repeat CT IVH stable NSGY consult if patient declines, and if the family indicates this would be within goals of care at the time   Hypertension Home meds:  Telmisartan 20mg ,  Carvedilol 6.25mg  BID Stable Resume home Coreg Aggressive BP control, goal SBP < 160 Long-term BP goal normotensive  Hyperlipidemia Home meds:  Crestor 10mg  daily LDL 79, goal < 70 Consider to resume statin at discharge  Other Stroke Risk Factors Advanced Age >/= 74   Other Active Problems Graves Dz status post left eye surgery causing ptosis, methimazole 5mg  twice weekly Right hand resting tremor, concerning for Parkinson disease.  Patient has no neurology following PTA.   Hospital day # 1  Willette Cluster, MD Internal Medicine Resident, PGY-1 [facility]  Pager: [phone]   ATTENDING NOTE: I reviewed above note and agree with the assessment and plan. Pt was seen and examined.   Wife and daughter are at the bedside.  Patient sleepy, lethargic, open eyes on voice but easily falls back to sleep without further stimulation.  On exam, patient eyes open on voice, orientated to place, time and people  and age, mild psychomotor slowing.  Right gaze preference, barely cross midline.  Not blinking to visual threat on the left.  Left facial droop, left hemiplegia and diminished left upper and lower extremity sensation.  Patient received a TNK last night, developed a headache, repeat CT showed large right ICH with worsening IVH but decreased parenchymal ICH volume.  Patient stated that his headache improved overnight, likely due to partial ICH converting to IVH.  Continue 3% saline with sodium goal 150-155.  Continue ICU monitoring, if mental status decline, may need to repeat CT and consider EVD placement.  BP goal less than 160.  PT and OT pending.  I had long discussion with wife and daughter at bedside, updated pt current condition, treatment plan and potential prognosis, and answered all the questions. Discussed with them possible clinical worsening and frequent GOC discussion. They expressed understanding and appreciation.  Patient currently CODE STATUS  DNR.  For detailed assessment and plan, please refer to above/below as I have made changes wherever appropriate.   Marvel Plan, MD PhD Stroke Neurology 10/06/2022 6:17 PM  This patient is critically ill due to right MCA stroke status post TNK, large hemorrhagic transformation, IVH, cerebral edema and at significant risk of neurological worsening, death form brain herniation, hydrocephalus, seizure. This patient's care requires constant monitoring of vital signs, hemodynamics, respiratory and cardiac monitoring, review of multiple databases, neurological assessment, discussion with family, other specialists and medical decision making of high complexity. I spent 50 minutes of neurocritical care time in the care of this patient.    To contact Stroke Continuity provider, please refer to [email or web address]. After hours, contact General Neurology

## 2022-10-06 NOTE — Evaluation (Signed)
Speech Language Pathology Evaluation Patient Details Name: KLEIN WILLCOX MRN: [id number] DOB: [birth year] Today's Date: 10/06/2022 Time: 7829-5621 SLP Time Calculation (min) (ACUTE ONLY): 12 min  Problem List:  Patient Active Problem List   Diagnosis Date Noted   Cerebrovascular accident (CVA) (HCC) 10/05/2022   CVA (cerebral vascular accident) (HCC) 10/05/2022   Right-sided nontraumatic intracerebral hemorrhage (HCC) 10/05/2022   Lid retraction of left eye 02/07/2018   Meniere disease, left 01/02/2018   Bilateral sensorineural hearing loss 11/13/2017   Gastroesophageal reflux disease 10/18/2017   Vertigo 10/10/2017   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 10/10/2017   B12 deficiency 10/06/2017   Regular astigmatism of left eye 06/21/2017   Graves disease 10/27/2016   Elevated PSA 10/27/2016   Thrombocytopenia (HCC) 06/25/2016   Latent tuberculosis 02/19/2016   Exophthalmos, constant, left 11/17/2015   Hyperlipidemia 05/27/2013   Vitamin D deficiency 05/27/2013   Hypertension 02/11/2010   Past Medical History:  Past Medical History:  Diagnosis Date   Abdominal pain 10/10/2017   Arthritis    Back pain    Colon polyp 2011   tubular adenoma.  no HG dysplasia.     Constipation    COPD (chronic obstructive pulmonary disease) (HCC)    no per pt   Dehydration 10/10/2017   Dyspnea    with exertion   ED (erectile dysfunction)    Essential hypertension, benign    Gallbladder sludge 10/2017   GERD (gastroesophageal reflux disease)    acute gastritis   Graves disease    History of kidney stones    HTN (hypertension)    Other and unspecified hyperlipidemia    PONV (postoperative nausea and vomiting)    Pt's wife also said that when he had a nerve block in 2015 here, pt had a reaction immediately. 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- finished medication   Vertigo     Past Surgical History:  Past Surgical History:  Procedure Laterality Date   CATARACT EXTRACTION Bilateral    CHOLECYSTECTOMY N/A 01/18/2018   Procedure: LAPAROSCOPIC CHOLECYSTECTOMY WITH INTRAOPERATIVE CHOLANGIOGRAM ERAS PATHWAY;  Surgeon: Griselda Miner, MD;  Location: [facility] OR;  Service: General;  Laterality: N/A;   COLONOSCOPY  03/2010    Dr Walker Kehr, [facility]. Higher risk screening study, sister's hx of colon CA.     ESOPHAGOGASTRODUODENOSCOPY (EGD) WITH PROPOFOL N/A 10/11/2017   Procedure: ESOPHAGOGASTRODUODENOSCOPY (EGD) WITH PROPOFOL;  Surgeon: Napoleon Form, MD;  Location: [facility];  Service: Endoscopy;  Laterality: N/A;   EYE SURGERY     lid-lift   SPINAL FUSION  2001   cervical   TENDON REPAIR Right 05/16/2014   Procedure: RIGHT RING FINGER FLEXOR TENDON REPAIR WITH PULL OUT BUTTON;  Surgeon: Dominica Severin, MD;  Location: [facility];  Service: Orthopedics;  Laterality: Right;   UPPER GASTROINTESTINAL ENDOSCOPY     HPI:  The pt is an 82 yo male presenting 5/1 with L-sided facial droop and slurred speech. Pt given TNK 15:01 5/1, developed headache, found to have large hemorrhagic conversion. Head CT: SAH right Sylvian fissure and scattered right sulci, interval extension into right-greater-than-left lateral  ventricles, with enlargement of the temporal and occipital horns,  concerning for hydrocephalus. Increased hypodensity and loss of gray-white differentiation in the right posterior frontal lobe, consistent with known infarct. PMH includes: COPD, dyspnea on exertion, HTN, graves disease, GERD, kidney  stones, vertigo, arthritis, HOH wears bilateral HA.   Assessment / Plan / Recommendation Clinical Impression  Pt presents with significant right hemisphere communication deficits marked by flat affect, inattention to left with right gaze preference, delayed responses with severe deficits in sustained attention (impacting working memory), moderate dysarthria  of speech, poor judgment and awareness, impaired pragmatics. Pt will benefit from intensive SLP intervention while admitted and at next level of care.    SLP Assessment  SLP Recommendation/Assessment: Patient needs continued Speech Lanaguage Pathology Services SLP Visit Diagnosis: Cognitive communication deficit (R41.841)    Recommendations for follow up therapy are one component of a multi-disciplinary discharge planning process, led by the attending physician.  Recommendations may be updated based on patient status, additional functional criteria and insurance authorization.    Follow Up Recommendations  Other (comment) (tba)    Assistance Recommended at Discharge  Frequent or constant Supervision/Assistance  Functional Status Assessment Patient has had a recent decline in their functional status and demonstrates the ability to make significant improvements in function in a reasonable and predictable amount of time.  Frequency and Duration min 2x/week  2 weeks      SLP Evaluation Cognition  Overall Cognitive Status: Impaired/Different from baseline Arousal/Alertness: Awake/alert Orientation Level: Oriented to person;Oriented to place;Disoriented to time;Disoriented to situation Attention: Sustained Sustained Attention: Impaired Sustained Attention Impairment: Verbal basic;Functional basic Memory: Impaired Memory Impairment: Storage deficit Awareness: Impaired Awareness Impairment: Intellectual impairment Safety/Judgment: Impaired       Comprehension  Auditory Comprehension Overall Auditory Comprehension: Appears within functional limits for tasks assessed Reading Comprehension Reading Status: Not tested    Expression Expression Primary Mode of Expression: Verbal Verbal Expression Overall Verbal Expression: Impaired Initiation: Impaired   Oral / Motor  Oral Motor/Sensory Function Overall Oral Motor/Sensory Function: Mild impairment Facial ROM: Reduced left;Suspected CN  VII (facial) dysfunction Facial Symmetry: Abnormal symmetry left;Suspected CN VII (facial) dysfunction Facial Sensation: Reduced left;Suspected CN V (Trigeminal) dysfunction Lingual ROM: Within Functional Limits Lingual Symmetry: Within Functional Limits Motor Speech Overall Motor Speech: Impaired Phonation: Hoarse Resonance: Hypernasality Articulation: Impaired Level of Impairment: Phrase Intelligibility: Intelligibility reduced Word: 50-74% accurate Phrase: 75-100% accurate Motor Planning: Witnin functional limits            [name] 10/06/2022, 10:20 AM Marchelle Folks L. Samson Frederic, MA CCC/SLP Clinical Specialist - Acute Care SLP Acute Rehabilitation Services Office number [phone]

## 2022-10-06 NOTE — Progress Notes (Signed)
   10/06/22 0148  Spiritual Encounters  Type of Visit Initial  Care provided to: Pt and family  Conversation partners present during encounter Nurse  Referral source Family  Reason for visit Urgent spiritual support  OnCall Visit Yes  Spiritual Framework  Presenting Themes Goals in life/care;Values and beliefs;Significant life change;Impactful experiences and emotions  Values/beliefs God as heaier  Patient Stress Factors Health changes  Family Stress Factors Other (Comment) (anticipatory grief)  Interventions  Spiritual Care Interventions Made Established relationship of care and support;Compassionate presence;Reflective listening;Normalization of emotions;Narrative/life review;Bereavement/grief support;Prayer;Supported grief process  Intervention Outcomes  Outcomes Connection to spiritual care;Connection to values and goals of care;Awareness of support;Reduced anxiety;Patient family open to resources  Spiritual Care Plan  Spiritual Care Issues Still Outstanding Referring to oncoming chaplain for further support  Follow up plan  Chaplain support will be available when requested by family.   Chaplain Nichael Ehly responded to a page from [facility]. Family requested chaplain's presence. Chaplain provided emotional and spiritual support for the patient's wife and daughter. Family asked for prayer. Family is praying for full healing for the patient.   Note prepared by Arlyce Dice, Chaplain Resident [phone]

## 2022-10-06 NOTE — Evaluation (Signed)
Clinical/Bedside Swallow Evaluation Patient Details  Name: Mario Smith MRN: [id number] Date of Birth: [birth year]  Today's Date: 10/06/2022 Time: SLP Start Time (ACUTE ONLY): 4098 SLP Stop Time (ACUTE ONLY): 0943 SLP Time Calculation (min) (ACUTE ONLY): 15 min  Past Medical History:  Past Medical History:  Diagnosis Date   Abdominal pain 10/10/2017   Arthritis    Back pain    Colon polyp 2011   tubular adenoma.  no HG dysplasia.     Constipation    COPD (chronic obstructive pulmonary disease) (HCC)    no per pt   Dehydration 10/10/2017   Dyspnea    with exertion   ED (erectile dysfunction)    Essential hypertension, benign    Gallbladder sludge 10/2017   GERD (gastroesophageal reflux disease)    acute gastritis   Graves disease    History of kidney stones    HTN (hypertension)    Other and unspecified hyperlipidemia    PONV (postoperative nausea and vomiting)    Pt's wife also said that when he had a nerve block in 2015 here, pt had a reaction immediately. 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 Higher risk screening study, sister's hx of colon CA.     ESOPHAGOGASTRODUODENOSCOPY (EGD) WITH PROPOFOL N/A 10/11/2017   Procedure: ESOPHAGOGASTRODUODENOSCOPY (EGD) WITH PROPOFOL;  Surgeon: Napoleon Form, MD;  Location: [facility];  Service: Endoscopy;  Laterality: N/A;   EYE SURGERY     lid-lift   SPINAL FUSION  2001   cervical   TENDON REPAIR Right 05/16/2014    Procedure: RIGHT RING FINGER FLEXOR TENDON REPAIR WITH PULL OUT BUTTON;  Surgeon: Dominica Severin, MD;  Location: [facility];  Service: Orthopedics;  Laterality: Right;   UPPER GASTROINTESTINAL ENDOSCOPY     HPI:  The pt is an 82 yo male presenting 5/1 with L-sided facial droop and slurred speech. Pt given TNK 15:01 5/1, developed headache, found to have hemorrhagic conversion. Head CT: SAH right Sylvian fissure and scattered right sulci, interval extension into right-greater-than-left lateral  ventricles, with enlargement of the temporal and occipital horns,  concerning for hydrocephalus. Increased hypodensity and loss of gray-white differentiation inthe right posterior frontal lobe, consistent with known infarct. PMH includes: COPD, dyspnea on exertion, HTN, graves disease, GERD, kidney stones, vertigo, arthritis, HOH wears bilateral HA.    Assessment / Plan / Recommendation  Clinical Impression  Pt presents with a neurogenic dysphagia with concerns for aspiration with thin and nectar thick liquids.  He demonstrates focal deficits CN V, VII on left, decreased oral awareness, probable delay in initiation of the pharyngeal swallow, and delayed coughing with liquids. Recommend proceeding with MBS today. D/W family at bedside; RN. Will proceed with MBS at 11:30. SLP Visit Diagnosis: Dysphagia, oropharyngeal phase (R13.12)    Aspiration Risk    tba   Diet Recommendation   NPO pending MBS       Other  Recommendations Oral Care Recommendations: Oral  care QID    Recommendations for follow up therapy are one component of a multi-disciplinary discharge planning process, led by the attending physician.  Recommendations may be updated based on patient status, additional functional criteria and insurance authorization.  Follow up Recommendations Other (comment) (tba)              Frequency and Duration min 2x/week  2 weeks        Swallow Study   General Date of Onset:  10/05/22 HPI: The pt is an 82 yo male presenting 5/1 with L-sided facial droop and slurred speech. Pt given TNK 15:01 5/1, developed headache, found to have hemorrhagic conversion. Head CT: SAH right Sylvian fissure and scattered right sulci, interval extension into right-greater-than-left lateral  ventricles, with enlargement of the temporal and occipital horns,  concerning for hydrocephalus. Increased hypodensity and loss of gray-white differentiation inthe right posterior frontal lobe, consistent with known infarct. PMH includes: COPD, dyspnea on exertion, HTN, graves disease, GERD, kidney stones, vertigo, arthritis, HOH wears bilateral HA. Type of Study: Bedside Swallow Evaluation Diet Prior to this Study: NPO Temperature Spikes Noted: No Respiratory Status: Nasal cannula History of Recent Intubation: No Behavior/Cognition: Alert;Confused Oral Cavity Assessment: Dry Oral Care Completed by SLP: Recent completion by staff Oral Cavity - Dentition: Adequate natural dentition Vision: Impaired for self-feeding Self-Feeding Abilities: Total assist Patient Positioning: Upright in bed Baseline Vocal Quality: Hoarse Volitional Cough: Weak    Oral/Motor/Sensory Function Overall Oral Motor/Sensory Function: Mild impairment Facial ROM: Reduced left;Suspected CN VII (facial) dysfunction Facial Symmetry: Abnormal symmetry left;Suspected CN VII (facial) dysfunction Facial Sensation: Reduced left;Suspected CN V (Trigeminal) dysfunction Lingual ROM: Within Functional Limits Lingual Symmetry: Within Functional Limits   Ice Chips Ice chips: Within functional limits   Thin Liquid Thin Liquid: Impaired Presentation: Cup;Straw Pharyngeal  Phase Impairments: Suspected delayed Swallow;Cough - Delayed    Nectar Thick Nectar Thick Liquid: Impaired Presentation: Cup;Straw Pharyngeal Phase Impairments: Suspected delayed Swallow;Cough - Delayed   Honey Thick Honey Thick Liquid: Not tested   Puree Puree: Within  functional limits   Solid     Solid: Not tested      Blenda Smith Mario 10/06/2022,10:11 AM  .Mario Smith

## 2022-10-06 NOTE — Progress Notes (Signed)
Modified Barium Swallow Study  Patient Details  Name: Mario Smith MRN: [id number] Date of Birth: 11/04/1940  Today's Date: 10/06/2022  Modified Barium Swallow completed.  Full report located under Chart Review in the Imaging Section.  History of Present Illness The pt is an 82 yo male presenting 5/1 with L-sided facial droop and slurred speech. Pt given TNK 15:01 5/1, developed headache, found to have large hemorrhagic conversion. Head CT: SAH right Sylvian fissure and scattered right sulci, interval extension into right-greater-than-left lateral  ventricles, with enlargement of the temporal and occipital horns,  concerning for hydrocephalus. Increased hypodensity and loss of gray-white differentiation in the right posterior frontal lobe, consistent with known infarct. PMH includes: COPD, dyspnea on exertion, HTN, graves disease, GERD, kidney stones, vertigo, arthritis, HOH wears bilateral HA.   Clinical Impression Pt presents with a primary oral dysphagia with both sensory and motor impairments.  His right gaze preference caused right head turn throughout study; pt tended to elevate chin and and required frequent cues to lower head.  There was notable oral holding of boluses, poor oral containment with barium spilling posteriorly as well as out of mouth on left without awareness. Left oral residue persisted.  There was mildly reduced pharyngeal stripping wave, reduced inversion of tip of epiglottis, but generally functional arytenoid to base of epiglottis squeeze. There were intermittent episodes of  transient penetration of thins, nectars, and honey-thick liquids, but no observable aspiration. Penetration was related to timing of swallow onset, premature loss of bolus, and  inattention, with pt frequently talking while swallowing.   Recommend starting a dysphagia 1 diet/thin liquids.  Episodes of aspiration have potential to occur - regardless of consistency - due to his poor awareness and sensory  loss. Recommend full supervision with meals; allow one sip at a time with a straw; cue pt to stop talking when POs are in his mouth. SLP will follow. RN present for exam and we discussed results/recs.   Factors that may increase risk of adverse event in presence of aspiration Rubye Oaks [employer] Clearance Coots 2021): Reduced cognitive function;Dependence for feeding and/or oral hygiene  Swallow Evaluation Recommendations Recommendations: PO diet PO Diet Recommendation: Dysphagia 1 (Pureed);Thin liquids (Level 0) Liquid Administration via: Straw Medication Administration: Crushed with puree Supervision: Full assist for feeding Swallowing strategies  : Check for pocketing or oral holding;Small bites/sips;Slow rate Postural changes: Position pt fully upright for meals Oral care recommendations: Oral care BID (2x/day)    Harli Engelken L. Samson Frederic, MA CCC/SLP Clinical Specialist - Acute Care SLP Acute Rehabilitation Services Office number [phone]   [name] 10/06/2022,12:09 PM

## 2022-10-06 NOTE — Progress Notes (Signed)
Peripherally Inserted Central Catheter Placement  The IV Nurse has discussed with the patient and/or persons authorized to consent for the patient, the purpose of this procedure and the potential benefits and risks involved with this procedure.  The benefits include less needle sticks, lab draws from the catheter, and the patient may be discharged home with the catheter. Risks include, but not limited to, infection, bleeding, blood clot (thrombus formation), and puncture of an artery; nerve damage and irregular heartbeat and possibility to perform a PICC exchange if needed/ordered by physician.  Alternatives to this procedure were also discussed.  Bard Power PICC patient education guide, fact sheet on infection prevention and patient information card has been provided to patient /or left at bedside.    PICC Placement Documentation  PICC Triple Lumen 10/06/22 Right Basilic 38 cm 1 cm (Active)  Indication for Insertion or Continuance of Line Administration of hyperosmolar/irritating solutions (i.e. TPN, Vancomycin, etc.) 10/06/22 1525  Exposed Catheter (cm) 1 cm 10/06/22 1525  Site Assessment Clean, Dry, Intact 10/06/22 1525  Lumen #1 Status Saline locked;Flushed;Blood return noted 10/06/22 1525  Lumen #2 Status Flushed;Saline locked;Blood return noted 10/06/22 1525  Lumen #3 Status Flushed;Saline locked;Blood return noted 10/06/22 1525  Dressing Type Transparent;Securing device 10/06/22 1525  Dressing Status Antimicrobial disc in place;Clean, Dry, Intact 10/06/22 1525  Safety Lock Not Applicable 10/06/22 1525  Line Care Connections checked and tightened 10/06/22 1525  Line Adjustment (NICU/IV Team Only) No 10/06/22 1525  Dressing Intervention New dressing 10/06/22 1525  Dressing Change Due 10/13/22 10/06/22 1525    Patient's spouse, Ian Castagna, signed PICC consent.   Annett Fabian 10/06/2022, 3:28 PM

## 2022-10-06 NOTE — Progress Notes (Signed)
  Transition of Care (TOC) Screening Note   Patient Details  Name: Mario Smith Date of Birth: [birth year]   Transition of Care [facility] CM/SW Contact:    Glennon Mac, RN Phone Number: 10/06/2022, 1:54 PM    Transition of Care Department [facility]) has reviewed patient and no TOC needs have been identified at this time. We will continue to monitor patient advancement through interdisciplinary progression rounds. If new patient transition needs arise, please place a TOC consult.  Quintella Baton, RN, BSN  Trauma/Neuro ICU Case Manager [phone]

## 2022-10-06 NOTE — Progress Notes (Signed)
   10/06/22 1300  Spiritual Encounters  Type of Visit Follow up  Care provided to: Patient;Family  Referral source Chaplain team  Reason for visit Routine spiritual support  OnCall Visit No  Interventions  Spiritual Care Interventions Made Established relationship of care and support;Compassionate presence;Reflective listening;Prayer   Ch followed-up with pt. Pt's wife was at bedside. She was concerned about pt's health and wanted Ch to prayer for healing. Ch provided hospitality and offered a word of prayer for healing. No follow-up needed at this time.

## 2022-10-06 NOTE — Progress Notes (Signed)
OT Cancellation Note  Patient Details Name: JAYLENN ALTIER MRN: [id number] DOB: 03-Apr-1941   Cancelled Treatment:    Reason Eval/Treat Not Completed: Active bedrest order (Discussed with nursing. Will follow up tomorrow as appropriate.)  Tequilla Cousineau,HILLARY 10/06/2022, 2:05 PM Luisa Dago, OT/L   Acute OT Clinical Specialist Acute Rehabilitation Services Pager [phone] Office [phone]

## 2022-10-06 NOTE — Progress Notes (Signed)
2D echo attempted, patient unavailable. Will try later

## 2022-10-06 NOTE — Progress Notes (Addendum)
Pt has no history of A.fib. Current rhythm on monitor is A.fib/flutter. MD made aware and orders for EKG placed.

## 2022-10-06 NOTE — Progress Notes (Addendum)
Head CT personally reviewed, agree with radiology; reviewed with family at bedside 1. Slight interval increase in the size of the right intraparenchymal hematoma, with surrounding edema and mass effect on the right lateral ventricle. No significant midline shift. 2. Redemonstrated subarachnoid hemorrhage in the right Sylvian fissure and scattered right cerebral hemisphere sulci, as well as now in the left temporal sulci. 3. Interval extension into right-greater-than-left lateral ventricles, with enlargement of the temporal and occipital horns, concerning for hydrocephalus. 4. Increased hypodensity and loss of gray-white differentiation in the right posterior frontal lobe, consistent with known infarct.   Patient now complaining of worsening headache, slightly slower to respond, otherwise exam remains stable  Giving 10 units additional cryoprecipitate, after reviewing risk of blood products and obtaining consent from family Increase sodium rate to 75 cc/hr Will not be a surgical candidate due to recent TNK  Grave prognosis reviewed with family, but given he is still at this time interactive will continue all medical measures   Addendum 6:30 AM Notified by nursing, patient is a little more restless  Plan -250 cc 3% additional bolus as last sodium only 142 per lab by verbal report, could not be entered in EMR as it was processed under [facility] order and patient had already been discharged from there -Repeat Head CT for 8 AM with 0.5 mg Ativan for sedation if needed -Soft waist restraints    25 minutes of additional care  Brooke Dare MD-PhD [facility] [phone] Available 7 PM to 7 AM, outside of these hours please call Neurologist on call as listed on Amion.

## 2022-10-06 NOTE — Progress Notes (Signed)
PT Cancellation Note  Patient Details Name: Mario Smith MRN: [id number] DOB: [birth year]   Cancelled Treatment:    Reason Eval/Treat Not Completed: Active bedrest order this morning that is not set to expire until 20:53 today. PT will continue to follow but hold on initial evaluation until after bedrest expires or until otherwise directed by MD.   Vickki Muff, PT, DPT   Acute Rehabilitation Department Office [phone] Secure Chat Communication Preferred   [name] 10/06/2022, 8:17 AM

## 2022-10-07 ENCOUNTER — Inpatient Hospital Stay (HOSPITAL_COMMUNITY): Payer: No Typology Code available for payment source

## 2022-10-07 DIAGNOSIS — G936 Cerebral edema: Secondary | ICD-10-CM | POA: Diagnosis not present

## 2022-10-07 DIAGNOSIS — G8194 Hemiplegia, unspecified affecting left nondominant side: Secondary | ICD-10-CM | POA: Diagnosis not present

## 2022-10-07 DIAGNOSIS — R509 Fever, unspecified: Secondary | ICD-10-CM

## 2022-10-07 DIAGNOSIS — I639 Cerebral infarction, unspecified: Secondary | ICD-10-CM | POA: Diagnosis not present

## 2022-10-07 DIAGNOSIS — A419 Sepsis, unspecified organism: Secondary | ICD-10-CM | POA: Diagnosis not present

## 2022-10-07 DIAGNOSIS — E870 Hyperosmolality and hypernatremia: Secondary | ICD-10-CM | POA: Diagnosis not present

## 2022-10-07 DIAGNOSIS — Z66 Do not resuscitate: Secondary | ICD-10-CM | POA: Diagnosis not present

## 2022-10-07 DIAGNOSIS — I619 Nontraumatic intracerebral hemorrhage, unspecified: Secondary | ICD-10-CM | POA: Diagnosis not present

## 2022-10-07 DIAGNOSIS — I63511 Cerebral infarction due to unspecified occlusion or stenosis of right middle cerebral artery: Secondary | ICD-10-CM | POA: Diagnosis not present

## 2022-10-07 DIAGNOSIS — Z515 Encounter for palliative care: Secondary | ICD-10-CM | POA: Diagnosis not present

## 2022-10-07 DIAGNOSIS — I63411 Cerebral infarction due to embolism of right middle cerebral artery: Secondary | ICD-10-CM | POA: Diagnosis not present

## 2022-10-07 DIAGNOSIS — G935 Compression of brain: Secondary | ICD-10-CM | POA: Diagnosis not present

## 2022-10-07 DIAGNOSIS — I611 Nontraumatic intracerebral hemorrhage in hemisphere, cortical: Secondary | ICD-10-CM | POA: Diagnosis not present

## 2022-10-07 DIAGNOSIS — I615 Nontraumatic intracerebral hemorrhage, intraventricular: Secondary | ICD-10-CM | POA: Diagnosis not present

## 2022-10-07 DIAGNOSIS — G911 Obstructive hydrocephalus: Secondary | ICD-10-CM | POA: Diagnosis not present

## 2022-10-07 DIAGNOSIS — I480 Paroxysmal atrial fibrillation: Secondary | ICD-10-CM

## 2022-10-07 DIAGNOSIS — G9349 Other encephalopathy: Secondary | ICD-10-CM | POA: Diagnosis not present

## 2022-10-07 DIAGNOSIS — R0603 Acute respiratory distress: Secondary | ICD-10-CM

## 2022-10-07 DIAGNOSIS — I6389 Other cerebral infarction: Secondary | ICD-10-CM | POA: Diagnosis not present

## 2022-10-07 DIAGNOSIS — J9601 Acute respiratory failure with hypoxia: Secondary | ICD-10-CM | POA: Diagnosis not present

## 2022-10-07 DIAGNOSIS — J690 Pneumonitis due to inhalation of food and vomit: Secondary | ICD-10-CM | POA: Diagnosis not present

## 2022-10-07 LAB — CBC WITH DIFFERENTIAL/PLATELET
Abs Immature Granulocytes: 0.02 10*3/uL (ref 0.00–0.07)
Basophils Absolute: 0 10*3/uL (ref 0.0–0.1)
Basophils Relative: 0 %
Eosinophils Absolute: 0 10*3/uL (ref 0.0–0.5)
Eosinophils Relative: 0 %
HCT: 38 % — ABNORMAL LOW (ref 39.0–52.0)
Hemoglobin: 12.3 g/dL — ABNORMAL LOW (ref 13.0–17.0)
Immature Granulocytes: 0 %
Lymphocytes Relative: 12 %
Lymphs Abs: 1 10*3/uL (ref 0.7–4.0)
MCH: 31.2 pg (ref 26.0–34.0)
MCHC: 32.4 g/dL (ref 30.0–36.0)
MCV: 96.4 fL (ref 80.0–100.0)
Monocytes Absolute: 1 10*3/uL (ref 0.1–1.0)
Monocytes Relative: 11 %
Neutro Abs: 6.6 10*3/uL (ref 1.7–7.7)
Neutrophils Relative %: 77 %
Platelets: 102 10*3/uL — ABNORMAL LOW (ref 150–400)
RBC: 3.94 MIL/uL — ABNORMAL LOW (ref 4.22–5.81)
RDW: 14 % (ref 11.5–15.5)
WBC: 8.7 10*3/uL (ref 4.0–10.5)
nRBC: 0 % (ref 0.0–0.2)

## 2022-10-07 LAB — LACTIC ACID, PLASMA: Lactic Acid, Venous: 1.1 mmol/L (ref 0.5–1.9)

## 2022-10-07 LAB — SODIUM
Sodium: 169 mmol/L (ref 135–145)
Sodium: 154 mmol/L — ABNORMAL HIGH (ref 135–145)
Sodium: 153 mmol/L — ABNORMAL HIGH (ref 135–145)
Sodium: 169 mmol/L (ref 135–145)
Sodium: 153 mmol/L — ABNORMAL HIGH (ref 135–145)

## 2022-10-07 LAB — COMPREHENSIVE METABOLIC PANEL
ALT: 14 U/L (ref 0–44)
AST: 17 U/L (ref 15–41)
Albumin: 3.2 g/dL — ABNORMAL LOW (ref 3.5–5.0)
Alkaline Phosphatase: 38 U/L (ref 38–126)
Anion gap: 11 (ref 5–15)
BUN: 13 mg/dL (ref 8–23)
CO2: 21 mmol/L — ABNORMAL LOW (ref 22–32)
Calcium: 8.8 mg/dL — ABNORMAL LOW (ref 8.9–10.3)
Chloride: 122 mmol/L — ABNORMAL HIGH (ref 98–111)
Creatinine, Ser: 1.3 mg/dL — ABNORMAL HIGH (ref 0.61–1.24)
GFR, Estimated: 55 mL/min — ABNORMAL LOW (ref >60)
Glucose, Bld: 125 mg/dL — ABNORMAL HIGH (ref 70–99)
Potassium: 3.8 mmol/L (ref 3.5–5.1)
Sodium: 154 mmol/L — ABNORMAL HIGH (ref 135–145)
Total Bilirubin: 0.9 mg/dL (ref 0.3–1.2)
Total Protein: 6 g/dL — ABNORMAL LOW (ref 6.5–8.1)

## 2022-10-07 LAB — ECHOCARDIOGRAM COMPLETE
AR max vel: 3.06 cm2
AV Area VTI: 3.09 cm2
AV Area mean vel: 2.64 cm2
AV Mean grad: 3 mmHg
AV Peak grad: 3.9 mmHg
Ao pk vel: 0.99 m/s
Area-P 1/2: 2.9 cm2
S' Lateral: 2.8 cm

## 2022-10-07 LAB — LIPASE, BLOOD: Lipase: 38 U/L (ref 11–51)

## 2022-10-07 LAB — PREPARE CRYOPRECIPITATE
Unit division: 0
Unit division: 0

## 2022-10-07 LAB — TSH: TSH: 1.249 u[IU]/mL (ref 0.350–4.500)

## 2022-10-07 LAB — BPAM CRYOPRECIPITATE
Blood Product Expiration Date: 202405072359
Unit Type and Rh: 6200

## 2022-10-07 LAB — PROCALCITONIN: Procalcitonin: <0.1 ng/mL

## 2022-10-07 LAB — STREP PNEUMONIAE URINARY ANTIGEN: Strep Pneumo Urinary Antigen: NEGATIVE

## 2022-10-07 LAB — CORTISOL: Cortisol, Plasma: 28.3 ug/dL

## 2022-10-07 MED ORDER — SODIUM CHLORIDE 0.9 % IV SOLN
2.0000 g | Freq: Every day | INTRAVENOUS | Status: DC
  Administered 2022-10-07: 2 g via INTRAVENOUS
  Filled 2022-10-07: qty 20

## 2022-10-07 MED ORDER — AMLODIPINE BESYLATE 10 MG PO TABS
10.0000 mg | ORAL_TABLET | Freq: Every day | ORAL | Status: DC
  Administered 2022-10-07: 10 mg via ORAL
  Filled 2022-10-07 (×2): qty 1

## 2022-10-07 MED ORDER — HYDRALAZINE HCL 20 MG/ML IJ SOLN
20.0000 mg | INTRAMUSCULAR | Status: DC | PRN
  Administered 2022-10-07 – 2022-10-08 (×3): 20 mg via INTRAVENOUS
  Filled 2022-10-07 (×4): qty 1

## 2022-10-07 MED ORDER — SODIUM CHLORIDE 0.9 % IV SOLN
500.0000 mg | Freq: Every day | INTRAVENOUS | Status: DC
  Administered 2022-10-07: 500 mg via INTRAVENOUS
  Filled 2022-10-07: qty 5

## 2022-10-07 MED ORDER — SODIUM CHLORIDE 0.9 % IV SOLN
3.0000 g | Freq: Four times a day (QID) | INTRAVENOUS | Status: DC
  Administered 2022-10-07 – 2022-10-12 (×19): 3 g via INTRAVENOUS
  Filled 2022-10-07 (×19): qty 8

## 2022-10-07 MED ORDER — LORAZEPAM 2 MG/ML IJ SOLN: 0.5000 mg | Freq: Once | INTRAMUSCULAR | Status: DC

## 2022-10-07 MED ORDER — SODIUM CHLORIDE 3 % IV SOLN
INTRAVENOUS | Status: DC
  Filled 2022-10-07: qty 500

## 2022-10-07 MED ORDER — LABETALOL HCL 5 MG/ML IV SOLN
20.0000 mg | INTRAVENOUS | Status: DC | PRN
  Administered 2022-10-07 – 2022-10-08 (×4): 20 mg via INTRAVENOUS
  Filled 2022-10-07 (×4): qty 4

## 2022-10-07 MED ORDER — IRBESARTAN 150 MG PO TABS
150.0000 mg | ORAL_TABLET | Freq: Every day | ORAL | Status: DC
  Administered 2022-10-07: 150 mg via ORAL
  Filled 2022-10-07: qty 1

## 2022-10-07 MED ORDER — LORAZEPAM 2 MG/ML IJ SOLN
0.5000 mg | Freq: Once | INTRAMUSCULAR | Status: DC | PRN
  Filled 2022-10-07: qty 1

## 2022-10-07 NOTE — Progress Notes (Signed)
  Echocardiogram 2D Echocardiogram has been performed.  Mario Smith 10/07/2022, 9:56 AM

## 2022-10-07 NOTE — Consult Note (Signed)
Mario Smith, MRN:  [id number], DOB:  1941-03-22, LOS: 2 ADMISSION DATE:  11/04/2022, CONSULTATION DATE: 10/07/2022 REFERRING MD: Dr. Roda Shutters, CHIEF COMPLAINT: Fever  History of Present Illness:  82 year old male with hypertension, hyperlipidemia and tuberculosis status posttreatment who was admitted with acute right MCA stroke, received TNK, complicated with hemorrhagic conversion leading to large intraparenchymal hemorrhage on right side.  Patient was started on 3% hypertonic saline.  He started spiking fever with Tmax 102, PCCM was consulted for help evaluation medical management  Patient denies chest pain, shortness of breath, nausea, vomiting, cough and dysuria  Pertinent  Medical History   Past Medical History:  Diagnosis Date   Abdominal pain 10/10/2017   Arthritis    Back pain    Colon polyp 2011   tubular adenoma.  no HG dysplasia.     Constipation    COPD (chronic obstructive pulmonary disease) (HCC)    no per pt   Dehydration 10/10/2017   Dyspnea    with exertion   ED (erectile dysfunction)    Essential hypertension, benign    Gallbladder sludge 10/2017   GERD (gastroesophageal reflux disease)    acute gastritis   Graves disease    History of kidney stones    HTN (hypertension)    Other and unspecified hyperlipidemia    PONV (postoperative nausea and vomiting)    Pt's wife also said that when he had a nerve block in 2015 here, pt had a reaction immediately. 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- finished medication   Vertigo      Significant Hospital Events: Including procedures, antibiotic start and stop dates in addition to other pertinent events     Interim History / Subjective:  Consulted  Objective   Blood pressure (!) 145/76, pulse 63, temperature (!) 100.9 F (38.3 C), temperature source Axillary, resp. rate 15, weight 78.8 kg, SpO2  100 %.        Intake/Output Summary (Last 24 hours) at 10/07/2022 4098 Last data filed at 10/07/2022 0700 Gross per 24 hour  Intake 2004.58 ml  Output 850 ml  Net 1154.58 ml   Filed Weights   10/25/2022 1418  Weight: 78.8 kg    Examination: Physical exam: General: Acute on chronically ill-appearing male, lying on the bed HEENT: [place]/AT, eyes anicteric.  Severely dry mucus membranes Neuro: Alert, awake, following commands.  Antigravity on right side, plegic left upper extremity, 2/5 left lower extremity.  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 Continue neuro watch every hour Stroke team is following Continue secondary stroke prophylaxis Hold antiplatelet and anticoagulation  Cerebral edema with brain compression Induced hypernatremia/hyperchloremia Patient serum sodium trended up to 154 3% hypertonic saline was stopped Monitor serum sodium every 6 hours Keep head end of the bed elevated >30 Maintain euthymia Seizure precautions  Fever Patient started spiking fever with Tmax 102 Differential could be aspiration pneumonia versus central fever Procalcitonin is negative Cultures are pending Continue IV Unasyn for  now, low threshold to stop  Paroxysmal A-fib with controlled rate Patient was noted to be in A-fib this morning with heart rate in 60s Continue Coreg Holding anticoagulation in the setting of intraparenchymal hemorrhage Continue telemetry monitoring  CKD stage IIIa Serum creatinine is at baseline, monitor intake and output Avoid nephrotoxic  agents  Anemia and thrombocytopenia of critical illness Monitor H&H and platelet count  Best Practice (right click and "Reselect all SmartList Selections" daily)   Diet/type: dysphagia diet (see orders) DVT prophylaxis: SCD GI prophylaxis: PPI Lines: Central line Foley:  Yes, and it is still needed Code Status:  DNR Last date of multidisciplinary goals of care discussion [5/3 patient wife and daughter were updated at bedside]  Labs   CBC: Recent Labs  Lab 31-Oct-2022 1402 10-31-2022 1409 10/31/2022 1817 10/07/22 0545  WBC 6.6  --  6.7 8.7  NEUTROABS 4.5  --   --  6.6  HGB 15.1 15.0 14.3 12.3*  HCT 45.3 44.0 43.6 38.0*  MCV 93.2  --  94.2 96.4  PLT 131*  --  126* 102*    Basic Metabolic Panel: Recent Labs  Lab 10/31/22 1402 10/31/2022 1409 31-Oct-2022 1817 10/06/22 0617 10/06/22 1243 10/06/22 1804 10/06/22 2355 10/07/22 0149 10/07/22 0545  NA 135 140   < > 144 146* 150* 169* 154* 154*  K 3.9 4.1  --   --   --   --   --   --  3.8  CL 104 107  --   --   --   --   --   --  122*  CO2 24  --   --   --   --   --   --   --  21*  GLUCOSE 103* 102*  --   --   --   --   --   --  125*  BUN 19 20  --   --   --   --   --   --  13  CREATININE 1.22 1.20  --   --   --   --   --   --  1.30*  CALCIUM 8.7*  --   --   --   --   --   --   --  8.8*  MG  --   --   --  2.1  --   --   --   --   --    < > = values in this interval not displayed.   GFR: Estimated Creatinine Clearance: 41 mL/min (A) (by C-G formula based on SCr of 1.3 mg/dL (H)). Recent Labs  Lab 10/31/2022 1402 2022/10/31 1817 10/07/22 0545  PROCALCITON  --   --  <0.10  WBC 6.6 6.7 8.7  LATICACIDVEN  --   --  1.1    Liver Function Tests: Recent Labs  Lab 31-Oct-2022 1402 10/07/22 0545  AST 20 17  ALT 16 14  ALKPHOS 63 38  BILITOT 0.9 0.9  PROT 6.9 6.0*  ALBUMIN 3.9 3.2*   Recent Labs  Lab 10/07/22 0545  LIPASE 38   No results for input(s): "AMMONIA" in the last 168 hours.  ABG    Component Value Date/Time    TCO2 24 10-31-2022 1409     Coagulation Profile: Recent Labs  Lab 10/31/22 1402 31-Oct-2022 1817  INR 0.9 1.0    Cardiac Enzymes: No results for input(s): "CKTOTAL", "CKMB", "CKMBINDEX", "TROPONINI" in the last 168 hours.  HbA1C: Hgb A1c MFr  Bld  Date/Time Value Ref Range Status  10/06/2022 06:17 AM 5.4 4.8 - 5.6 % Final    Comment:    (NOTE) Pre diabetes:          5.7%-6.4%  Diabetes:              >6.4%  Glycemic control for   <7.0% adults with diabetes     CBG: Recent Labs  Lab 10/14/22 1401  GLUCAP 96    Review of Systems:   12 point review of system is significant for complaint mentioned HPI, rest negative  Past Medical History:  He,  has a past medical history of Abdominal pain (10/10/2017), Arthritis, Back pain, Colon polyp (2011), Constipation, COPD (chronic obstructive pulmonary disease) (HCC), Dehydration (10/10/2017), Dyspnea, ED (erectile dysfunction), Essential hypertension, benign, Gallbladder sludge (10/2017), GERD (gastroesophageal reflux disease), Graves disease, History of kidney stones, HTN (hypertension), Other and unspecified hyperlipidemia, PONV (postoperative nausea and vomiting), Thrombocytopenia (HCC), Tuberculosis, and Vertigo.   Surgical History:   Past Surgical History:  Procedure Laterality Date   CATARACT EXTRACTION Bilateral    CHOLECYSTECTOMY N/A 01/18/2018   Procedure: LAPAROSCOPIC CHOLECYSTECTOMY WITH INTRAOPERATIVE CHOLANGIOGRAM ERAS PATHWAY;  Surgeon: Griselda Miner, MD;  Location: [facility] OR;  Service: General;  Laterality: N/A;   COLONOSCOPY  03/2010    Dr Walker Kehr, [facility]. Higher risk screening study, sister's hx of colon CA.     ESOPHAGOGASTRODUODENOSCOPY (EGD) WITH PROPOFOL N/A 10/11/2017   Procedure: ESOPHAGOGASTRODUODENOSCOPY (EGD) WITH PROPOFOL;  Surgeon: Napoleon Form, MD;  Location: [facility];  Service: Endoscopy;  Laterality: N/A;   EYE SURGERY     lid-lift   SPINAL FUSION  2001   cervical   TENDON REPAIR  Right 05/16/2014   Procedure: RIGHT RING FINGER FLEXOR TENDON REPAIR WITH PULL OUT BUTTON;  Surgeon: Dominica Severin, MD;  Location: [facility];  Service: Orthopedics;  Laterality: Right;   UPPER GASTROINTESTINAL ENDOSCOPY       Social History:   reports that he quit smoking about 29 years ago. His smoking use included cigarettes. He quit smokeless tobacco use about 29 years ago.  His smokeless tobacco use included chew. He reports that he does not currently use alcohol. He reports that he does not use drugs.   Family History:  His family history includes Alcohol abuse in his father; COPD in his father and mother; Cancer in his brother and sister; Colon cancer in his sister; Emphysema in his sister and sister; Healthy in his daughter; Heart disease in his father and mother; Prostate cancer in his brother. There is no history of Esophageal cancer, Stomach cancer, or Rectal cancer.   Allergies Allergies  Allergen Reactions   Simvastatin Other (See Comments)    Leg cramps   Sulfa Antibiotics     UNSPECIFIED REACTION    Codeine Nausea And Vomiting   Doxycycline Nausea And Vomiting   Hydrocodone Nausea And Vomiting   Meclizine Nausea Only   Niacin And Related Other (See Comments)    Flushing in the face and redness    Ondansetron Other (See Comments)    Tremors    Phenergan [Promethazine Hcl] Other (See Comments)    Tremors      Home Medications  Prior to Admission medications   Medication Sig Start Date End Date Taking? Authorizing Provider  albuterol (VENTOLIN HFA) 108 (90 Base) MCG/ACT inhaler Inhale 2 puffs into the lungs every 6 (six) hours as needed for wheezing or shortness of breath. 06/26/20  Yes Harlow Mares  M, FNP  aspirin 81 MG EC tablet Take 81 mg by mouth daily.   Yes [provider]  carvedilol (COREG) 6.25 MG tablet Take 1 tablet (6.25 mg total) by mouth 2 (two) times daily with a meal. 08/26/22  Yes Dettinger, Elige Radon, MD  Cholecalciferol  (VITAMIN D3) 2000 UNITS TABS Take 2,000 Units by mouth daily.   Yes [provider]  Coenzyme Q10 (CO Q 10) 100 MG CAPS Take 100 mg by mouth daily.   Yes [provider]  methimazole (TAPAZOLE) 5 MG tablet Take 1 tablet (5 mg total) by mouth 2 (two) times a week. 12/27/21  Yes Dettinger, Elige Radon, MD  Omega-3 Fatty Acids (FISH OIL) 1000 MG CAPS Take 1,000 mg by mouth daily.   Yes [provider]  pantoprazole (PROTONIX) 40 MG tablet Take 1 tablet (40 mg total) by mouth 2 (two) times daily. 12/24/21  Yes Dettinger, Elige Radon, MD  rosuvastatin (CRESTOR) 10 MG tablet Take 1 tablet (10 mg total) by mouth daily. 12/24/21  Yes Dettinger, Elige Radon, MD  telmisartan (MICARDIS) 40 MG tablet Take 0.5 tablets (20 mg total) by mouth daily. 12/24/21  Yes Dettinger, Elige Radon, MD  tamsulosin (FLOMAX) 0.4 MG CAPS capsule Take 1 capsule (0.4 mg total) by mouth daily. Patient not taking: Reported on 2022/11/02 12/24/21   Dettinger, Elige Radon, MD     Critical care time:     This patient is critically ill with multiple organ system failure which requires frequent high complexity decision making, assessment, support, evaluation, and titration of therapies. This was completed through the application of advanced monitoring technologies and extensive interpretation of multiple databases.  During this encounter critical care time was devoted to patient care services described in this note for 38 minutes.    Cheri Fowler, MD [facility] See Amion for pager If no response to pager, please call [phone] until 7pm After 7pm, Please call E-link [phone]

## 2022-10-07 NOTE — Progress Notes (Addendum)
STROKE TEAM PROGRESS NOTE   INTERVAL HISTORY His wife, daughter, son in law were at the bedside.  CCM consulted for fever management. HTS back at 50ml/hr - Na 153. BP medications increased today  Moderate increase in Volume of R intraparenchymal hemorrhage from 75 to on CT yesterday. Underlying ventricular size has not changed on interval CT although there was concern for potential hydrocephalus. Patient is lethargic but interactive in the room. Denies headache and A&Ox4. Patient denies headache. Family thinks things are improving with his speech and swallowing.  MRI shows Large intraparenchymal hematoma in the right cerebral hemisphere with intraventricular and subarachnoid extension of hemorrhage, similar to prior CT. Subacute infarct in the posterior right frontal lobe, corresponding to area of hypodensity seen on prior CT. Advanced chronic microvascular ischemic changes of the white matter. Small remote infarcts in the bilateral cerebellar hemispheres.  Bilateral exophthalmos, left greater than right.  Vitals:   10/07/22 0800 10/07/22 0815 10/07/22 0830 10/07/22 0900  BP: (!) 144/80 (!) 155/85 (!) 157/80 (!) 150/68  Pulse: (!) 56 67 63 71  Resp: 14 19 19 19   Temp: 99.4 F (37.4 C)     TempSrc: Axillary     SpO2:  99% 99% 97%  Weight:       CBC:  Recent Labs  Lab Nov 01, 2022 1402 11-01-22 1409 01-Nov-2022 1817 10/07/22 0545  WBC 6.6  --  6.7 8.7  NEUTROABS 4.5  --   --  6.6  HGB 15.1   < > 14.3 12.3*  HCT 45.3   < > 43.6 38.0*  MCV 93.2  --  94.2 96.4  PLT 131*  --  126* 102*   < > = values in this interval not displayed.    Basic Metabolic Panel:  Recent Labs  Lab Nov 01, 2022 1402 11/01/22 1409 01-Nov-2022 1817 10/06/22 0617 10/06/22 1243 10/07/22 0149 10/07/22 0545  NA 135 140   < > 144   < > 154* 154*  K 3.9 4.1  --   --   --   --  3.8  CL 104 107  --   --   --   --  122*  CO2 24  --   --   --   --   --  21*  GLUCOSE 103* 102*  --   --   --   --  125*  BUN 19 20  --    --   --   --  13  CREATININE 1.22 1.20  --   --   --   --  1.30*  CALCIUM 8.7*  --   --   --   --   --  8.8*  MG  --   --   --  2.1  --   --   --    < > = values in this interval not displayed.    Lipid Panel:  Recent Labs  Lab 10/06/22 0617  CHOL 130  TRIG 64  HDL 38*  CHOLHDL 3.4  VLDL 13  LDLCALC 79    HgbA1c:  Recent Labs  Lab 10/06/22 0617  HGBA1C 5.4    Urine Drug Screen:  Recent Labs  Lab 11/01/22 1553  LABOPIA NONE DETECTED  COCAINSCRNUR NONE DETECTED  LABBENZ NONE DETECTED  AMPHETMU NONE DETECTED  THCU NONE DETECTED  LABBARB NONE DETECTED     Alcohol Level  Recent Labs  Lab 11-01-22 1402  ETH <10     IMAGING past 24 hours DG CHEST PORT  1 VIEW  Result Date: 10/06/2022 CLINICAL DATA:  PICC line placement EXAM: PORTABLE CHEST 1 VIEW COMPARISON:  07/03/2020 FINDINGS: Right PICC line in place with the tip at the cavoatrial junction. Heart and mediastinal contours are within normal limits. Mild vascular congestion. Left lower lobe atelectasis or infiltrate with possible small left effusion. No focal opacity on the right. IMPRESSION: Right PICC line tip at the cavoatrial junction. Left lower lobe atelectasis or infiltrate with small left effusion. Electronically Signed   By: Charlett Nose M.D.   On: 10/06/2022 15:56   [place] EKG SITE RITE  Result Date: 10/06/2022 If Site Rite image not attached, placement could not be confirmed due to current cardiac rhythm.  DG Swallowing Func-Speech Pathology  Result Date: 10/06/2022 Table formatting from the original result was not included. Modified Barium Swallow Study Patient Details Name: Mario Smith MRN: [id number] Date of Birth: 1941/06/06 Today's Date: 10/06/2022 HPI/PMH: HPI: The pt is an 82 yo male presenting 5/1 with L-sided facial droop and slurred speech. Pt given TNK 15:01 5/1, developed headache, found to have large hemorrhagic conversion. Head CT: SAH right Sylvian fissure and scattered right sulci, interval  extension into right-greater-than-left lateral  ventricles, with enlargement of the temporal and occipital horns,  concerning for hydrocephalus. Increased hypodensity and loss of gray-white differentiation in the right posterior frontal lobe, consistent with known infarct. PMH includes: COPD, dyspnea on exertion, HTN, graves disease, GERD, kidney stones, vertigo, arthritis, HOH wears bilateral HA. Clinical Impression: Clinical Impression: Pt presents with a primary oral dysphagia with both sensory and motor impairments.  His right gaze preference caused right head turn throughout study; pt tended to elevate chin and and required frequent cues to lower head.  There was notable oral holding of boluses, poor oral containment with barium spilling posteriorly as well as out of mouth on left without awareness. Left oral residue persisted.  There was mildly reduced pharyngeal stripping wave, reduced inversion of tip of epiglottis, but generally functional arytenoid to base of epiglottis squeeze. There were intermittent episodes of  transient penetration of thins, nectars, and honey-thick liquids, but no observable aspiration. Penetration was related to timing of swallow onset, premature loss of bolus, and  inattention, with pt frequently talking while swallowing. Recommend starting a dysphagia 1 diet/thin liquids.  Episodes of aspiration have potential to occur - regardless of consistency - due to his poor awareness and sensory loss. Recommend full supervision with meals; allow one sip at a time with a straw; cue pt to stop talking when POs are in his mouth. SLP will follow. RN present for exam and we discussed results/recs. Factors that may increase risk of adverse event in presence of aspiration Rubye Oaks & Clearance Coots 2021): Factors that may increase risk of adverse event in presence of aspiration Rubye Oaks & Clearance Coots 2021): Reduced cognitive function; Dependence for feeding and/or oral hygiene Recommendations/Plan: Swallowing  Evaluation Recommendations Swallowing Evaluation Recommendations Recommendations: PO diet PO Diet Recommendation: Dysphagia 1 (Pureed); Thin liquids (Level 0) Liquid Administration via: Straw Medication Administration: Crushed with puree Supervision: Full assist for feeding Swallowing strategies  : Check for pocketing or oral holding; Small bites/sips; Slow rate Postural changes: Position pt fully upright for meals Oral care recommendations: Oral care BID (2x/day) Treatment Plan Treatment Plan Treatment recommendations: Therapy as outlined in treatment plan below Functional status assessment: Patient has had a recent decline in their functional status and demonstrates the ability to make significant improvements in function in a reasonable and predictable amount of time. Treatment frequency:  Min 2x/week Treatment duration: 2 weeks Interventions: Aspiration precaution training; Patient/family education; Trials of upgraded texture/liquids; Diet toleration management by SLP Recommendations Recommendations for follow up therapy are one component of a multi-disciplinary discharge planning process, led by the attending physician.  Recommendations may be updated based on patient status, additional functional criteria and insurance authorization. Assessment: Orofacial Exam: Orofacial Exam Oral Cavity: Oral Hygiene: WFL Oral Cavity - Dentition: Adequate natural dentition Orofacial Anatomy: WFL Oral Motor/Sensory Function: Suspected cranial nerve impairment CN V - Trigeminal: Left sensory impairment CN VII - Facial: Left motor impairment CN XII - Hypoglossal: WFL Anatomy: Anatomy: Presence of cervical hardware Boluses Administered: Boluses Administered Boluses Administered: Thin liquids (Level 0); Mildly thick liquids (Level 2, nectar thick); Moderately thick liquids (Level 3, honey thick); Puree  Oral Impairment Domain: Oral Impairment Domain Lip Closure: Escape beyond mid-chin Tongue control during bolus hold: Posterior  escape of less than half of bolus Bolus preparation/mastication: Slow prolonged chewing/mashing with complete recollection Bolus transport/lingual motion: Slow tongue motion Oral residue: Residue collection on oral structures Location of oral residue : Floor of mouth; Tongue; Lateral sulci Initiation of pharyngeal swallow : Pyriform sinuses  Pharyngeal Impairment Domain: Pharyngeal Impairment Domain Soft palate elevation: No bolus between soft palate (SP)/pharyngeal wall (PW) Laryngeal elevation: Complete superior movement of thyroid cartilage with complete approximation of arytenoids to epiglottic petiole Anterior hyoid excursion: Complete anterior movement Epiglottic movement: Partial inversion Laryngeal vestibule closure: Incomplete, narrow column air/contrast in laryngeal vestibule Pharyngeal stripping wave : Present - diminished Pharyngeal contraction (A/P view only): N/A Pharyngoesophageal segment opening: Complete distension and complete duration, no obstruction of flow Tongue base retraction: Narrow column of contrast or air between tongue base and PPW Pharyngeal residue: Collection of residue within or on pharyngeal structures Location of pharyngeal residue: Tongue base; Valleculae; Pharyngeal wall; Pyriform sinuses  Esophageal Impairment Domain: Esophageal Impairment Domain Esophageal clearance upright position: -- (n/a) Pill: Esophageal Impairment Domain Esophageal clearance upright position: -- (n/a) Penetration/Aspiration Scale Score: Penetration/Aspiration Scale Score 1.  Material does not enter airway: Moderately thick liquids (Level 3, honey thick); Puree 2.  Material enters airway, remains ABOVE vocal cords then ejected out: Thin liquids (Level 0); Mildly thick liquids (Level 2, nectar thick); Moderately thick liquids (Level 3, honey thick) 3.  Material enters airway, remains ABOVE vocal cords and not ejected out: Thin liquids (Level 0) Compensatory Strategies: Compensatory Strategies Compensatory  strategies: Yes Straw: -- (negligible effect)   General Information: Caregiver present: No  Diet Prior to this Study: NPO   Temperature : Normal   No data recorded  Supplemental O2: Nasal cannula   History of Recent Intubation: No  Behavior/Cognition: Alert; Confused Self-Feeding Abilities: Dependent for feeding Baseline vocal quality/speech: Dysphonic No data recorded No data recorded Exam Limitations: Poor positioning Goal Planning: Prognosis for improved oropharyngeal function: Good Barriers to Reach Goals: Cognitive deficits No data recorded No data recorded Consulted and agree with results and recommendations: Pt unable/family or caregiver not available Pain: Pain Assessment Pain Assessment: No/denies pain End of Session: Start Time:SLP Start Time (ACUTE ONLY): 1120 Stop Time: SLP Stop Time (ACUTE ONLY): 1140 Time Calculation:SLP Time Calculation (min) (ACUTE ONLY): 20 min Charges: SLP Evaluations $ SLP Speech Visit: 1 Visit SLP Evaluations $BSS Swallow: 1 Procedure $MBS Swallow: 1 Procedure $ SLP EVAL LANGUAGE/SOUND PRODUCTION: 1 Procedure SLP visit diagnosis: SLP Visit Diagnosis: Dysphagia, oropharyngeal phase (R13.12) Past Medical History: Past Medical History: Diagnosis Date  Abdominal pain 10/10/2017  Arthritis   Back pain   Colon polyp 2011  tubular adenoma.  no HG dysplasia.    Constipation   COPD (chronic obstructive pulmonary disease) (HCC)   no per pt  Dehydration 10/10/2017  Dyspnea   with exertion  ED (erectile dysfunction)   Essential hypertension, benign   Gallbladder sludge 10/2017  GERD (gastroesophageal reflux disease)   acute gastritis  Graves disease   History of kidney stones   HTN (hypertension)   Other and unspecified hyperlipidemia   PONV (postoperative nausea and vomiting)   Pt's wife also said that when he had a nerve block in 2015 here, pt had a reaction immediately. States pt got very shaky, lost his voice.,Pt does not want a nerve block again  Thrombocytopenia (HCC)   Tuberculosis    positive test PPD 2 years ago- finished medication  Vertigo  Past Surgical History: Past Surgical History: Procedure Laterality Date  CATARACT EXTRACTION Bilateral   CHOLECYSTECTOMY N/A 01/18/2018  Procedure: LAPAROSCOPIC CHOLECYSTECTOMY WITH INTRAOPERATIVE CHOLANGIOGRAM ERAS PATHWAY;  Surgeon: Griselda Miner, MD;  Location: [facility] OR;  Service: General;  Laterality: N/A;  COLONOSCOPY  03/2010   Dr Walker Kehr, [facility]. Higher risk screening study, sister's hx of colon CA.    ESOPHAGOGASTRODUODENOSCOPY (EGD) WITH PROPOFOL N/A 10/11/2017  Procedure: ESOPHAGOGASTRODUODENOSCOPY (EGD) WITH PROPOFOL;  Surgeon: Napoleon Form, MD;  Location: [facility] ENDOSCOPY;  Service: Endoscopy;  Laterality: N/A;  EYE SURGERY    lid-lift  SPINAL FUSION  2001  cervical  TENDON REPAIR Right 05/16/2014  Procedure: RIGHT RING FINGER FLEXOR TENDON REPAIR WITH PULL OUT BUTTON;  Surgeon: Dominica Severin, MD;  Location:  SURGERY CENTER;  Service: Orthopedics;  Laterality: Right;  UPPER GASTROINTESTINAL ENDOSCOPY   [name] 10/06/2022, 12:09 PM   PHYSICAL EXAM  Temp:  [99.3 F (37.4 C)-102.4 F (39.1 C)] 99.4 F (37.4 C) (05/03 0800) Pulse Rate:  [56-92] 71 (05/03 0900) Resp:  [12-25] 19 (05/03 0900) BP: (116-157)/(62-86) 150/68 (05/03 0900) SpO2:  [94 %-100 %] 97 % (05/03 0900)  General - Well nourished, well developed,lethargic, no acute distress.  Mental Status -  Level of arousal and orientation to time, place, and person were intact x 3. Language including expression, naming, repetition, comprehension was assessed and found intact. Attention span and concentration were normal. Recent and remote memory were intact. Fund of Knowledge was assessed and was intact.  Cranial Nerves II - XII - II - Visual field intact OU. III, IV, VI - Eyes unable to cross midline on L gaze, other wise extraocular movements intact. V - Facial sensation intact bilaterally but reduced on the left. VII - Left lower facial  droop, L ptosis. VIII - Hearing & vestibular intact bilaterally. X - Palate elevates symmetrically. XI - Chin turning & shoulder shrug intact bilaterally. XII - Tongue protrusion intact.  Motor Strength -RUE strength normal and no pronator drift. Left lower extremity with 2/5 strength to all joints. 0/5 strength to all L upper extremity joints. Bulk was normal and fasciculations were absent.    Motor Tone - Muscle tone was assessed at the neck and appendages and was decreased in the left upper and lower extremities..  Reflexes - Deferred.  Sensory - Light touch was normal to the R upper and lower extremity and absent to the L upper and lower extremity.    Coordination - No dysmetria with the R hand, unable to participate with the L hand. Pill rolling tremor noted to the R hand.    Gait and Station - deferred.   ASSESSMENT/PLAN Mr. Nouman Malagon  Smith is a 82 y.o. male with history of hypertension, hyperlipidemia, thrombocytopenia, Graves' disease with exophthalmos, Mnire's disease, hard of hearing at baseline uses hearing aids, cervical spine fusion, latent tuberculosis s/p treatment, COPD  presenting with ischemic stroke and secondary R cortical ICH secondary to TNK.   Stroke: right MCA infarct status post TNK, complicated by large parenchymal hemorrhagic infarct with IVH, stroke embolic pattern, likely due to new diagnosed A-fib Code Stroke:CT head with subtle loss of gray-white differentiation in R frontal lobe. Chronic small vessel disease. Atrophy. Remote R parietal cortical infarct. ASPECTS 9.    CTA head & neck: No large vessel occlusion or proximal hemodynamically significant stenosis CT repeat for headache showed large parenchymal hematoma centered in the right frontal lobe, SAH seen in the right sylvian fissure.  Mass effect on right lateral ventricle CT repeat slight interval increase of right parenchymal hematoma and ICH, interval extension right > left IVH into lateral ventricles,  enlargement of temporal and occipital horns, concerning for hydrocephalus.  Right posterior frontal infarct. CT repeat 5/2 stable parenchymal hematoma and IVH. MRI brain: Large intraparenchymal hematoma in the right cerebral hemisphere with intraventricular and subarachnoid extension of hemorrhage, similar to prior CT. Subacute infarct in the posterior right frontal lobe, corresponding to area of hypodensity seen on prior CT. Small remote infarcts in the bilateral cerebellar hemispheres.   2D Echo: EF 60-65%  LDL 79 HgbA1c 5.4 VTE prophylaxis - SCD aspirin 81 mg daily prior to admission, now on No antithrombotic.  Therapy recommendations: CIR Disposition:  Pending  Cerebral edema CT showed large ICH with mass effect on the right ventricle but no significant midline shift On 3% saline @75  -> 50 ml/hr S/p 3% bolus x 1 Na monitoring Na 140->144->146 -> 154 ->153  IVH, risk of hydrocephalus CT head concerning for developing hydrocephalus Repeat CT IVH stable MRI showed stable IVH NSGY consult if patient declines, and if the family indicates this would be within goals of care at the time   Afib, new diagnosis Telemetry and EKG captured A-fib Possible cause of stroke this time Hold off [facility] now due to hemorrhage May consider [facility] once hemorrhage resolves and neuro stable  Hypertension Home meds:  Telmisartan 20mg , Carvedilol 6.25mg  BID Stable on the high end Resume Coreg and avapro Add amlodipine Aggressive BP control, goal SBP < 160 Long-term BP goal normotensive  Hyperlipidemia Home meds:  Crestor 10mg  daily LDL 79, goal < 70 Statin on hold for now Consider to resume statin at discharge  Dysphagia Passed swallow Currently on dysphagia 1 and honey thick liquid Continue IV fluid Speech on board  Other Stroke Risk Factors Advanced Age >/= 94   Other Active Problems Graves Dz status post left eye surgery causing ptosis, methimazole 5mg  twice weekly Right hand resting  tremor, concerning for Parkinson disease.  Patient has no neurology following PTA.   Hospital day # 2  Patient seen and examined by NP/APP with MD. MD to update note as needed.   Elmer Picker, DNP, FNP-BC [facility] Pager: [phone]  ATTENDING NOTE: I reviewed above note and agree with the assessment and plan. Pt was seen and examined.   Wife and daughter at bedside.  Patient lying in bed, lethargic, however open eyes on voice, answer questions appropriately, orientated to place, age and people.  Moderate dysarthria, still has left hemiplegia, left facial droop and left neglect.  Moving right arm and leg.  MRI shows stable hemorrhagic transformation, IVH, no significant midline shift.  Overnight  found to have A-fib on telemetry and EKG.  Sodium 153 now, resume 3% saline at 50.  Not candidate for [facility], may consider once ICH resolves and patient neuro stable.  PT and OT recommend CIR.  For detailed assessment and plan, please refer to above/below as I have made changes wherever appropriate.   I had long discussion with wife and daughter at bedside, updated pt current condition, treatment plan and potential prognosis, and answered all the questions.  They expressed understanding and appreciation.    Marvel Plan, MD PhD Stroke Neurology 10/07/2022 4:31 PM  This patient is critically ill due to stroke status post TNK, hemorrhagic induration, IVH, hydrocephalus, A-fib new diagnosis and at significant risk of neurological worsening, death form hematoma expansion, brain herniation, hydrocephalus, heart failure, seizure. This patient's care requires constant monitoring of vital signs, hemodynamics, respiratory and cardiac monitoring, review of multiple databases, neurological assessment, discussion with family, other specialists and medical decision making of high complexity. I spent 45 minutes of neurocritical care time in the care of this patient.    To contact Stroke Continuity  provider, please refer to [email or web address]. After hours, contact General Neurology

## 2022-10-07 NOTE — Progress Notes (Addendum)
PROGRESS NOTE   SHAWNDRE PEEL  ZOX:[id number]    DOB: 1941/05/25    DOA: 11-04-2022  PCP: Dettinger, Elige Radon, MD   I have briefly reviewed patients previous medical records in [facility].  Chief Complaint  Patient presents with   Code Stroke    Brief Narrative:  82 year old married male with PMH of HTN, HLD, thrombocytopenia, Graves' disease with exophthalmos, Mnire's disease, hard of hearing on hearing aids, latent TB s/p treatment, COPD, admitted to ICU by stroke team for acute ischemic stroke (right MCA infarct), s/p TNK complicated by large parenchymal hemorrhagic infarct with IVH and mass effect, treating with 3% saline, complicated by hypernatremia.  TRH consulted on 5/3 for fevers, possibly neurogenic versus infectious from pneumonia.  As communicated with Dr.Jindong Roda Shutters and Dr. Cheri Fowler, [facility] will sign off on 5/3.  Please consult TRH again for assistance.   Assessment & Plan:  Principal Problem:   CVA (cerebral vascular accident) [facility]) Active Problems:   Cerebrovascular accident (CVA) (HCC)   Right-sided nontraumatic intracerebral hemorrhage (HCC)   Fevers, possibly central/neurogenic fever related to ICH versus infectious from possible aspiration pneumonia Tmax 102.4 on 5/2.  Lactate, WBC, TSH: WNL.  Blood cultures drawn and pending.  Chest x-ray shows left lower lobe atelectasis versus infiltrate with small pleural effusion.  UA on 5/1 not indicated above UTI.  UDS negative.  On dysphagia 1 diet, could have aspiration.  Cortisol 28.3.  Check urine pneumococcal antigen.  Follow procalcitonin levels.  Continue empirically started IV ceftriaxone and azithromycin.  Strict aspiration precautions.  Supportive treatment with antipyretics, cooling blanket as needed.  Fevers down this morning.  Hypernatremia: Suspected due to 3% saline.  Serum sodium on admission 135 which has gradually crept up.  Peaked to 169 at around midnight last night.  3% saline drip reduced from 75  mL/h to 50 mL/h and serum sodium has improved to 154.  Defer decision to continue versus stop and monitor BMP closely to Neurology team.  Communicated with Dr. Roda Shutters who plans to consult PCCM since patient is in the ICU.  Stage IIIa CKD: Creatinine appears to be close to baseline.  Follow BMP closely.  Thrombocytopenia: Appears chronic and intermittent.  Presented with platelet of 131 which is down to 102.  Monitor closely.  Possible atrial flutter: Telemetry shows periods of possible atrial flutter with controlled ventricular rate but does have tremor artifacts as well.  Awaiting repeat EEG, could not be done last night due to lack of patient cooperation.  TTE pending. TSH: Normal.  Not a candidate for anticoagulation anytime soon.  Hypertension: Controlled on carvedilol, continue.  Hyperlipidemia: Per stroke team, on Crestor 10 Mg daily PTA, LDL 79 with plans to resume statin at discharge.  Graves' disease: Continue methimazole.  TSH normal.  Acute ischemic stroke (right MCA infarct), suspecting embolic due to atrial flutter/A-fib, complicated by hemorrhagic transformation with large parenchymal hemorrhage and intraventricular hemorrhage with mass effect after TNK: Management per primary service/neurology.  Dysphagia: Strict aspiration precautions.  Per SLP, continue dysphagia 1 diet and thin liquids.  Body mass index is 27.21 kg/m.    ACP Documents: None present. DVT prophylaxis: SCD's Start: Nov 04, 2022 2053     Code Status: DNR:  Family Communication: Spouse and daughter at bedside. Disposition:  Status is: Inpatient Remains inpatient appropriate because: Remains on 3% NS, hyponatremia evaluation and management, IV antibiotics for fevers.     Consultants:   TRH were consultants-signed off 5/3 PCCM will take over medical consultation  from 5/3, as communicated by Dr. Merrily Pew  Procedures:     Antimicrobials:   As noted above   Subjective:  Per nursing, was restless  overnight but no agitation.  Did not allow EKG.  Fevers down after cooling blanket, cooling blanket now off.  Patient denies complaints.  States that he is "okay".  Denies cough, dyspnea or pain.  Objective:   Vitals:   10/07/22 0400 10/07/22 0500 10/07/22 0600 10/07/22 0700  BP: 127/70 (!) 142/77 (!) 147/86 (!) 145/76  Pulse: 86 86 81 63  Resp: 20 18 18 15   Temp: (!) 100.9 F (38.3 C)     TempSrc: Axillary     SpO2: 97% 96% 99% 100%  Weight:        General exam: Elderly male, moderately built and nourished lying comfortably propped up in bed without distress.  Does not appear toxic. Respiratory system: Clear to auscultation anteriorly.  Diminished breath sounds in the bases.  No wheezing, rhonchi or crackles.  No increased work of breathing. Cardiovascular system: S1 & S2 heard, RRR. No JVD, murmurs, rubs, gallops or clicks. No pedal edema.  Telemetry personally reviewed: Has tremor artifact but also noted what looks like possible atrial flutter with variable but controlled ventricular rate. Gastrointestinal system: Abdomen is nondistended, soft and nontender. No organomegaly or masses felt. Normal bowel sounds heard. Central nervous system: Alert and oriented to person, place. PERTLA eyes unable to cross midline on left gaze.  Left facial asymmetry with partial ptosis. Extremities: Right limbs graded 5 x 5 power.  Left lower extremity grade 2 x 5 power at least left upper extremity grade 2 x 5 power at least. Skin: No rashes, lesions or ulcers Psychiatry: Judgement and insight appear normal. Mood & affect appropriate.     Data Reviewed:   I have personally reviewed following labs and imaging studies   CBC: Recent Labs  Lab 11-03-2022 1402 11-03-2022 1409 2022/11/03 1817 10/07/22 0545  WBC 6.6  --  6.7 8.7  NEUTROABS 4.5  --   --  6.6  HGB 15.1 15.0 14.3 12.3*  HCT 45.3 44.0 43.6 38.0*  MCV 93.2  --  94.2 96.4  PLT 131*  --  126* 102*    Basic Metabolic Panel: Recent Labs   Lab 11/03/2022 1402 2022/11/03 1409 11-03-22 1817 10/06/22 0617 10/06/22 1243 10/06/22 1804 10/06/22 2355 10/07/22 0149 10/07/22 0545  NA 135 140   < > 144 146* 150* 169* 154* 154*  K 3.9 4.1  --   --   --   --   --   --  3.8  CL 104 107  --   --   --   --   --   --  122*  CO2 24  --   --   --   --   --   --   --  21*  GLUCOSE 103* 102*  --   --   --   --   --   --  125*  BUN 19 20  --   --   --   --   --   --  13  CREATININE 1.22 1.20  --   --   --   --   --   --  1.30*  CALCIUM 8.7*  --   --   --   --   --   --   --  8.8*  MG  --   --   --  2.1  --   --   --   --   --    < > =  values in this interval not displayed.    Liver Function Tests: Recent Labs  Lab 10/19/2022 1402 10/07/22 0545  AST 20 17  ALT 16 14  ALKPHOS 63 38  BILITOT 0.9 0.9  PROT 6.9 6.0*  ALBUMIN 3.9 3.2*    CBG: Recent Labs  Lab 10/28/2022 1401  GLUCAP 96    Microbiology Studies:   Recent Results (from the past 240 hour(s))  MRSA Next Gen by PCR, Nasal     Status: None   Collection Time: 10/26/2022  9:04 PM   Specimen: Nasal Mucosa; Nasal Swab  Result Value Ref Range Status   MRSA by PCR Next Gen NOT DETECTED NOT DETECTED Final    Comment: (NOTE) The GeneXpert MRSA Assay (FDA approved for NASAL specimens only), is one component of a comprehensive MRSA colonization surveillance program. It is not intended to diagnose MRSA infection nor to guide or monitor treatment for MRSA infections. Test performance is not FDA approved in patients less than 34 years old. Performed at [facility], [address], [place], [place] [address]     Radiology Studies:  DG CHEST PORT 1 VIEW  Result Date: 10/06/2022 CLINICAL DATA:  PICC line placement EXAM: PORTABLE CHEST 1 VIEW COMPARISON:  07/03/2020 FINDINGS: Right PICC line in place with the tip at the cavoatrial junction. Heart and mediastinal contours are within normal limits. Mild vascular congestion. Left lower lobe atelectasis or infiltrate with  possible small left effusion. No focal opacity on the right. IMPRESSION: Right PICC line tip at the cavoatrial junction. Left lower lobe atelectasis or infiltrate with small left effusion. Electronically Signed   By: Charlett Nose M.D.   On: 10/06/2022 15:56   [place] EKG SITE RITE  Result Date: 10/06/2022 If Site Rite image not attached, placement could not be confirmed due to current cardiac rhythm.  DG Swallowing Func-Speech Pathology  Result Date: 10/06/2022 Table formatting from the original result was not included. Modified Barium Swallow Study Patient Details Name: LOCKWOOD SCHNUR MRN: [id number] Date of Birth: [birth year] Today's Date: 10/06/2022 HPI/PMH: HPI: The pt is an 82 yo male presenting 5/1 with L-sided facial droop and slurred speech. Pt given TNK 15:01 5/1, developed headache, found to have large hemorrhagic conversion. Head CT: SAH right Sylvian fissure and scattered right sulci, interval extension into right-greater-than-left lateral  ventricles, with enlargement of the temporal and occipital horns,  concerning for hydrocephalus. Increased hypodensity and loss of gray-white differentiation in the right posterior frontal lobe, consistent with known infarct. PMH includes: COPD, dyspnea on exertion, HTN, graves disease, GERD, kidney stones, vertigo, arthritis, HOH wears bilateral HA. Clinical Impression: Clinical Impression: Pt presents with a primary oral dysphagia with both sensory and motor impairments.  His right gaze preference caused right head turn throughout study; pt tended to elevate chin and and required frequent cues to lower head.  There was notable oral holding of boluses, poor oral containment with barium spilling posteriorly as well as out of mouth on left without awareness. Left oral residue persisted.  There was mildly reduced pharyngeal stripping wave, reduced inversion of tip of epiglottis, but generally functional arytenoid to base of epiglottis squeeze. There were intermittent  episodes of  transient penetration of thins, nectars, and honey-thick liquids, but no observable aspiration. Penetration was related to timing of swallow onset, premature loss of bolus, and  inattention, with pt frequently talking while swallowing. Recommend starting a dysphagia 1 diet/thin liquids.  Episodes of aspiration have potential to occur - regardless of consistency -  due to his poor awareness and sensory loss. Recommend full supervision with meals; allow one sip at a time with a straw; cue pt to stop talking when POs are in his mouth. SLP will follow. RN present for exam and we discussed results/recs. Factors that may increase risk of adverse event in presence of aspiration Rubye Oaks & Clearance Coots 2021): Factors that may increase risk of adverse event in presence of aspiration Rubye Oaks & Clearance Coots 2021): Reduced cognitive function; Dependence for feeding and/or oral hygiene Recommendations/Plan: Swallowing Evaluation Recommendations Swallowing Evaluation Recommendations Recommendations: PO diet PO Diet Recommendation: Dysphagia 1 (Pureed); Thin liquids (Level 0) Liquid Administration via: Straw Medication Administration: Crushed with puree Supervision: Full assist for feeding Swallowing strategies  : Check for pocketing or oral holding; Small bites/sips; Slow rate Postural changes: Position pt fully upright for meals Oral care recommendations: Oral care BID (2x/day) Treatment Plan Treatment Plan Treatment recommendations: Therapy as outlined in treatment plan below Functional status assessment: Patient has had a recent decline in their functional status and demonstrates the ability to make significant improvements in function in a reasonable and predictable amount of time. Treatment frequency: Min 2x/week Treatment duration: 2 weeks Interventions: Aspiration precaution training; Patient/family education; Trials of upgraded texture/liquids; Diet toleration management by SLP Recommendations Recommendations for follow  up therapy are one component of a multi-disciplinary discharge planning process, led by the attending physician.  Recommendations may be updated based on patient status, additional functional criteria and insurance authorization. Assessment: Orofacial Exam: Orofacial Exam Oral Cavity: Oral Hygiene: WFL Oral Cavity - Dentition: Adequate natural dentition Orofacial Anatomy: WFL Oral Motor/Sensory Function: Suspected cranial nerve impairment CN V - Trigeminal: Left sensory impairment CN VII - Facial: Left motor impairment CN XII - Hypoglossal: WFL Anatomy: Anatomy: Presence of cervical hardware Boluses Administered: Boluses Administered Boluses Administered: Thin liquids (Level 0); Mildly thick liquids (Level 2, nectar thick); Moderately thick liquids (Level 3, honey thick); Puree  Oral Impairment Domain: Oral Impairment Domain Lip Closure: Escape beyond mid-chin Tongue control during bolus hold: Posterior escape of less than half of bolus Bolus preparation/mastication: Slow prolonged chewing/mashing with complete recollection Bolus transport/lingual motion: Slow tongue motion Oral residue: Residue collection on oral structures Location of oral residue : Floor of mouth; Tongue; Lateral sulci Initiation of pharyngeal swallow : Pyriform sinuses  Pharyngeal Impairment Domain: Pharyngeal Impairment Domain Soft palate elevation: No bolus between soft palate (SP)/pharyngeal wall (PW) Laryngeal elevation: Complete superior movement of thyroid cartilage with complete approximation of arytenoids to epiglottic petiole Anterior hyoid excursion: Complete anterior movement Epiglottic movement: Partial inversion Laryngeal vestibule closure: Incomplete, narrow column air/contrast in laryngeal vestibule Pharyngeal stripping wave : Present - diminished Pharyngeal contraction (A/P view only): N/A Pharyngoesophageal segment opening: Complete distension and complete duration, no obstruction of flow Tongue base retraction: Narrow column of  contrast or air between tongue base and PPW Pharyngeal residue: Collection of residue within or on pharyngeal structures Location of pharyngeal residue: Tongue base; Valleculae; Pharyngeal wall; Pyriform sinuses  Esophageal Impairment Domain: Esophageal Impairment Domain Esophageal clearance upright position: -- (n/a) Pill: Esophageal Impairment Domain Esophageal clearance upright position: -- (n/a) Penetration/Aspiration Scale Score: Penetration/Aspiration Scale Score 1.  Material does not enter airway: Moderately thick liquids (Level 3, honey thick); Puree 2.  Material enters airway, remains ABOVE vocal cords then ejected out: Thin liquids (Level 0); Mildly thick liquids (Level 2, nectar thick); Moderately thick liquids (Level 3, honey thick) 3.  Material enters airway, remains ABOVE vocal cords and not ejected out: Thin liquids (Level 0) Compensatory Strategies:  Compensatory Strategies Compensatory strategies: Yes Straw: -- (negligible effect)   General Information: Caregiver present: No  Diet Prior to this Study: NPO   Temperature : Normal   No data recorded  Supplemental O2: Nasal cannula   History of Recent Intubation: No  Behavior/Cognition: Alert; Confused Self-Feeding Abilities: Dependent for feeding Baseline vocal quality/speech: Dysphonic No data recorded No data recorded Exam Limitations: Poor positioning Goal Planning: Prognosis for improved oropharyngeal function: Good Barriers to Reach Goals: Cognitive deficits No data recorded No data recorded Consulted and agree with results and recommendations: Pt unable/family or caregiver not available Pain: Pain Assessment Pain Assessment: No/denies pain End of Session: Start Time:SLP Start Time (ACUTE ONLY): 1120 Stop Time: SLP Stop Time (ACUTE ONLY): 1140 Time Calculation:SLP Time Calculation (min) (ACUTE ONLY): 20 min Charges: SLP Evaluations $ SLP Speech Visit: 1 Visit SLP Evaluations $BSS Swallow: 1 Procedure $MBS Swallow: 1 Procedure $ SLP EVAL  LANGUAGE/SOUND PRODUCTION: 1 Procedure SLP visit diagnosis: SLP Visit Diagnosis: Dysphagia, oropharyngeal phase (R13.12) Past Medical History: Past Medical History: Diagnosis Date  Abdominal pain 10/10/2017  Arthritis   Back pain   Colon polyp 2011  tubular adenoma.  no HG dysplasia.    Constipation   COPD (chronic obstructive pulmonary disease) (HCC)   no per pt  Dehydration 10/10/2017  Dyspnea   with exertion  ED (erectile dysfunction)   Essential hypertension, benign   Gallbladder sludge 10/2017  GERD (gastroesophageal reflux disease)   acute gastritis  Graves disease   History of kidney stones   HTN (hypertension)   Other and unspecified hyperlipidemia   PONV (postoperative nausea and vomiting)   Pt's wife also said that when he had a nerve block in 2015 here, pt had a reaction immediately. States pt got very shaky, lost his voice.,Pt does not want a nerve block again  Thrombocytopenia (HCC)   Tuberculosis   positive test PPD 2 years ago- finished medication  Vertigo  Past Surgical History: Past Surgical History: Procedure Laterality Date  CATARACT EXTRACTION Bilateral   CHOLECYSTECTOMY N/A 01/18/2018  Procedure: LAPAROSCOPIC CHOLECYSTECTOMY WITH INTRAOPERATIVE CHOLANGIOGRAM ERAS PATHWAY;  Surgeon: Griselda Miner, MD;  Location: [facility];  Service: General;  Laterality: N/A;  COLONOSCOPY  03/2010   Dr Walker Kehr, [facility]. Higher risk screening study, sister's hx of colon CA.    ESOPHAGOGASTRODUODENOSCOPY (EGD) WITH PROPOFOL N/A 10/11/2017  Procedure: ESOPHAGOGASTRODUODENOSCOPY (EGD) WITH PROPOFOL;  Surgeon: Napoleon Form, MD;  Location: [facility] END[facility]COPY;  Service: Endoscopy;  Laterality: N/A;  EYE SURGERY    lid-lift  SPINAL FUSION  2001  cervical  TENDON REPAIR Right 05/16/2014  Procedure: RIGHT RING FINGER FLEXOR TENDON REPAIR WITH PULL OUT BUTTON;  Surgeon: Dominica Severin, MD;  Location: [facility];  Service: Orthopedics;  Laterality: Right;  UPPER GASTROINTESTINAL ENDOSCOPY   Blenda Mounts  [name] 10/06/2022, 12:09 PM  CT HEAD WO CONTRAST ( )  Result Date: 10/06/2022 CLINICAL DATA:  82 year old male code stroke presentation yesterday. Subsequent right frontal lobe hematoma. EXAM: CT HEAD WITHOUT CONTRAST TECHNIQUE: Contiguous axial images were obtained from the base of the skull through the vertex without intravenous contrast. RADIATION DOSE REDUCTION: This exam was performed according to the departmental dose-optimization program which includes automated exposure control, adjustment of the mA and/or kV according to patient size and/or use of iterative reconstruction technique. COMPARISON:  Head CT 2337 hours yesterday and earlier. FINDINGS: Brain: Mixed density right hemisphere intra-axial hemorrhage appears somewhat discontinuous, encompasses 72 x 45 x 44 mm (AP by transverse by CC).  Intra-axial volume estimated at 85 mL. Evidence of superimposed cytotoxic edema in the posterior right MCA territory most pronounced in the superolateral perirolandic region series 2, image 29. Underlying extensive bilateral cerebral white matter hypodensity. Subarachnoid extension of blood into the right sylvian fissure is stable from last night. Small volume contralateral left sylvian fissure subarachnoid blood also stable. Basilar cisterns are spared, patent. Moderate volume of intraventricular extension, hemorrhage filling the right occipital horn, layering in the left occipital horn. But underlying ventricular size and configuration has not significantly changed. Mild mass effect on the right lateral ventricle with no significant midline shift. Stable gray-white differentiation. Vascular: Calcified atherosclerosis at the skull base. Skull: Intact.  No acute osseous abnormality identified. Sinuses/Orbits: Visualized paranasal sinuses and mastoids are stable and well aerated. Other: Mild rightward gaze. Negative scalp soft tissues. Negative visible noncontrast deep soft tissue spaces of the face. IMPRESSION: 1.  Right hemisphere intra-axial Hemorrhage superimposed on MCA territory infarct is stable since last night, intra-axial blood volume estimated at 75-85 mL. 2. Moderate volume right greater than left Intraventricular And Subarachnoid blood is stable. 3. No acute ventriculomegaly. No midline shift. No new intracranial abnormality. Electronically Signed   By: Odessa Fleming M.D.   On: 10/06/2022 08:54   CT HEAD WO CONTRAST ( )  Result Date: 10/06/2022 CLINICAL DATA:  Hemorrhagic stroke EXAM: CT HEAD WITHOUT CONTRAST TECHNIQUE: Contiguous axial images were obtained from the base of the skull through the vertex without intravenous contrast. RADIATION DOSE REDUCTION: This exam was performed according to the departmental dose-optimization program which includes automated exposure control, adjustment of the mA and/or kV according to patient size and/or use of iterative reconstruction technique. COMPARISON:  10/16/2022 FINDINGS: Brain: Slight interval increase in the size of the right intraparenchymal hematoma, which now measures 7.1 x 4.0 x 5.3 cm (AP x TR x CC) (series 4, image 28 and series 6, image 45), with a volume of approximately 75 mm, with surrounding edema and mass effect on the right lateral ventricle. No significant midline shift. Redemonstrated subarachnoid hemorrhage in the right sylvian fissure and scattered right cerebral hemisphere sulci, as well as now in the left temporal sulci (series 4, image 22). Interval extension into right greater than left lateral ventricles. Enlargement of the temporal and occipital horns, concerning for hydrocephalus. Increased hypodensity and loss of gray-white differentiation in the right posterior frontal lobe (series 4, image 32), consistent with of all vein infarct. Vascular: No hyperdense vessel. Skull: Negative for fracture or focal lesion. Sinuses/Orbits: Mild mucosal thickening in the paranasal sinuses. No acute finding in the orbits. Other: The mastoids are well aerated.  IMPRESSION: 1. Slight interval increase in the size of the right intraparenchymal hematoma, with surrounding edema and mass effect on the right lateral ventricle. No significant midline shift. 2. Redemonstrated subarachnoid hemorrhage in the right Sylvian fissure and scattered right cerebral hemisphere sulci, as well as now in the left temporal sulci. 3. Interval extension into right-greater-than-left lateral ventricles, with enlargement of the temporal and occipital horns, concerning for hydrocephalus. 4. Increased hypodensity and loss of gray-white differentiation in the right posterior frontal lobe, consistent with known infarct. These findings were discussed by telephone on 10/06/2022 at 1:11 am with provider BHAGAT. Electronically Signed   By: Wiliam Ke M.D.   On: 10/06/2022 01:13   CT Head Wo Contrast  Result Date: 10/16/22 CLINICAL DATA:  Headache.  Sudden onset. EXAM: CT HEAD WITHOUT CONTRAST TECHNIQUE: Contiguous axial images were obtained from the base of the skull through the  vertex without intravenous contrast. RADIATION DOSE REDUCTION: This exam was performed according to the departmental dose-optimization program which includes automated exposure control, adjustment of the mA and/or kV according to patient size and/or use of iterative reconstruction technique. COMPARISON:  Same day CT head. FINDINGS: Brain: There is a 6.3 x 5.0 x 4.5 cm parenchymal hematoma centered in the right frontal lobe (approximately 70 cc), in the region of infarct questioned on same day CT head. Subarachnoid blood products are also seen in the right sylvian fissure. There is mass effect on the right lateral ventricular system which is otherwise unchanged in size. There is no significant midline shift. No hydrocephalus. No intraventricular blood products. Sequela of moderate chronic microvascular ischemic change. Vascular: No hyperdense vessel or unexpected calcification. Skull: Normal. Negative for fracture or focal  lesion. Sinuses/Orbits: No middle ear or mastoid effusion. Paranasal sinuses are clear. Bilateral lens replacement. Orbits are otherwise unremarkable. Other: None. IMPRESSION: 1. 6.3 x 5.0 x 4.5 cm parenchymal hematoma centered in the right frontal lobe (approximately 70 cc), in the region of infarct questioned on same day CT head. Subarachnoid blood products are also seen in the right Sylvian fissure. (Class 2 - 3C) 2. Mass effect on the right lateral ventricular system which is otherwise unchanged in size. No significant midline shift. Findings were discussed with Dr. Particia Nearing on 10/24/22 at 5:42 PM. Electronically Signed   By: Lorenza Cambridge M.D.   On: Oct 24, 2022 17:43   CT ANGIO HEAD NECK W WO CM (CODE STROKE)  Result Date: Oct 24, 2022 CLINICAL DATA:  Neuro deficit, acute, stroke suspected EXAM: CT ANGIOGRAPHY HEAD AND NECK WITH AND WITHOUT CONTRAST TECHNIQUE: Multidetector CT imaging of the head and neck was performed using the standard protocol during bolus administration of intravenous contrast. Multiplanar CT image reconstructions and MIPs were obtained to evaluate the vascular anatomy. Carotid stenosis measurements (when applicable) are obtained utilizing NASCET criteria, using the distal internal carotid diameter as the denominator. RADIATION DOSE REDUCTION: This exam was performed according to the departmental dose-optimization program which includes automated exposure control, adjustment of the mA and/or kV according to patient size and/or use of iterative reconstruction technique. CONTRAST:  75mL OMNIPAQUE IOHEXOL 350 MG/ML SOLN COMPARISON:  Same day CT head. FINDINGS: CTA NECK FINDINGS Aortic arch: Great vessel origins are patent without significant stenosis. Right carotid system: Atherosclerosis at the carotid bifurcation without greater than 50% stenosis. Left carotid system: Atherosclerosis at the carotid bifurcation without greater than 50% stenosis. Vertebral arteries: Mild atherosclerotic  narrowing of the right vertebral artery origin. Otherwise, vertebral arteries are patent without significant stenosis. Skeleton: C5-C7 ACDF, solid. Other neck: No acute abnormality on limited assessment. Upper chest: Visualized lung apices are clear. Review of the MIP images confirms the above findings CTA HEAD FINDINGS Anterior circulation: Bilateral intracranial ICAs, MCAs, and ACAs are patent without proximal hemodynamically significant stenosis. Posterior circulation: Bilateral intradural vertebral arteries, basilar artery and bilateral posterior cerebral arteries are patent without proximal hemodynamically significant stenosis. Venous sinuses: As permitted by contrast timing, patent. Review of the MIP images confirms the above findings IMPRESSION: No large vessel occlusion or proximal hemodynamically significant stenosis. Findings discussed with Dr. Otelia Limes via telephone at 3:27 PM. Electronically Signed   By: Feliberto Harts M.D.   On: 2022/10/24 15:29   CT HEAD CODE STROKE WO CONTRAST  Result Date: 10/24/22 CLINICAL DATA:  Code stroke. Neuro deficit, acute, stroke suspected l face droop EXAM: CT HEAD WITHOUT CONTRAST TECHNIQUE: Contiguous axial images were obtained from the base of the  skull through the vertex without intravenous contrast. RADIATION DOSE REDUCTION: This exam was performed according to the departmental dose-optimization program which includes automated exposure control, adjustment of the mA and/or kV according to patient size and/or use of iterative reconstruction technique. COMPARISON:  None Available. FINDINGS: Brain: Question subtle loss of gray differentiation in the high right frontal lobe. No acute hemorrhage, hydrocephalus, extra-axial collection or mass lesion/mass effect. Atrophy. Patchy T2/FLAIR hyperintensities in the white matter, nonspecific but compatible with chronic microvascular ischemic change. Remote right parietal cortical infarct. Vascular: No hyperdense vessel  identified. Skull: No acute fracture. Sinuses/Orbits: Clear sinuses.  No acute orbital findings. Other: No mastoid effusions. ASPECTS [facility] Stroke Program Early CT Score) total score (0-10 with 10 being normal): 9 IMPRESSION: 1. Question subtle loss of gray differentiation in the high right frontal lobe. This could reflect a small acute or subacute infarct or artifact. Consider MRI for more sensitive evaluation. ASPECTS is 9 at worst. 2. No acute hemorrhage. 3. Chronic microvascular ischemic change. Code stroke imaging results were communicated on 10/11/2022 at 2:12 pm to provider Dr. Charm Barges via telephone, who verbally acknowledged these results. Electronically Signed   By: Feliberto Harts M.D.   On: 10/23/2022 14:13    Scheduled Meds:    carvedilol  6.25 mg Oral BID WC   Chlorhexidine Gluconate Cloth  6 each Topical Daily   LORazepam  0.5 mg Intravenous Once   methimazole  5 mg Oral Once per day on Mon Thu   mouth rinse  15 mL Mouth Rinse 4 times per day   pantoprazole (PROTONIX) IV  40 mg Intravenous QHS   senna-docusate  1 tablet Oral BID   sodium chloride flush  10-40 mL Intracatheter Q12H    Continuous Infusions:    azithromycin Stopped (10/07/22 9604)   cefTRIAXone (ROCEPHIN)  IV Stopped (10/07/22 0525)   clevidipine 1 mg/hr (10/07/22 0700)   sodium chloride (hypertonic) 50 mL/hr at 10/07/22 0700     LOS: 2 days     Marcellus Scott, MD,  FACP, [facility], [facility], [place], [facility]   [facility] Advisor [place]     To contact the attending provider between 7A-7P or the covering provider during after hours 7P-7A, please log into the web site [email or web address] and access using universal [place] password for that web site. If you do not have the password, please call the hospital operator.  10/07/2022, 7:45 AM

## 2022-10-07 NOTE — Evaluation (Signed)
Occupational Therapy Evaluation Patient Details Name: Mario Smith MRN: [id number] DOB: Jul 28, 1940 Today's Date: 10/07/2022   History of Present Illness The pt is an 82 yo male presenting 5/1 with L-sided facial droop and slurred speech. Pt given TNK 15:01 5/1, developed headache and was found to have hemorrhagic conversion. PMH includes: COPD, dyspnea on exertion, HTN, graves disease, kidney stones, vertigo, and arthritis.   Clinical Impression   Prior to this admission, patient was independent, driving, liked to mow the yard and bass fish. Currently, patient presenting with dense L hemiplegia, R gaze preference (with L eyelid also affected by stroke but can open with effort) and need for significant assist to complete all ADLs and transfers. Patient max to total A of 2 for bed mobility, with LUE demonstrating no current movement, sensation, or response to painful stimuli. Patient able to sit EOB for 20+ minutes with significant assist from OT or PT due to heavy left lateral lean. Patient total A of 2 to stand. Given prior level of function, OT recommending rehab greater than >3 hours in hopes of returning to prior level of function.      Recommendations for follow up therapy are one component of a multi-disciplinary discharge planning process, led by the attending physician.  Recommendations may be updated based on patient status, additional functional criteria and insurance authorization.   Assistance Recommended at Discharge Frequent or constant Supervision/Assistance  Patient can return home with the following Two people to help with walking and/or transfers;A lot of help with bathing/dressing/bathroom;Assistance with cooking/housework;Assistance with feeding;Direct supervision/assist for medications management;Direct supervision/assist for financial management;Assist for transportation;Help with stairs or ramp for entrance    Functional Status Assessment  Patient has had a recent decline in  their functional status and demonstrates the ability to make significant improvements in function in a reasonable and predictable amount of time.  Equipment Recommendations   (Will continue to assess)    Recommendations for Other Services Rehab consult     Precautions / Restrictions Precautions Precautions: Fall Precaution Comments: SBP <160 Restrictions Weight Bearing Restrictions: No      Mobility Bed Mobility Overal bed mobility: Needs Assistance Bed Mobility: Sidelying to Sit, Sit to Sidelying   Sidelying to sit: Max assist, +2 for physical assistance, +2 for safety/equipment     Sit to sidelying: Total assist, +2 for physical assistance, +2 for safety/equipment General bed mobility comments: Able to initiate minimally with BLEs (trace noted at L hip flexor) but significant assist needed at the trunk. Patient with significant L lateral lean, able to sit for 20+ minutes EOB with heavy external assist from PT or OT. Total A of 2 to return to supine due to fatigue.    Transfers Overall transfer level: Needs assistance Equipment used: 2 person hand held assist Transfers: Sit to/from Stand Sit to Stand: Total assist, +2 physical assistance, +2 safety/equipment           General transfer comment: total A of 2 to complete incremental scooting after standing at EOB, minimal to no initiation noted, however patient extremely fatigued after sitting EOB for 20+ minutes      Balance Overall balance assessment: Needs assistance Sitting-balance support: Bilateral upper extremity supported, Feet supported Sitting balance-Leahy Scale: Zero Sitting balance - Comments: reliant on heavy external assist Postural control: Left lateral lean Standing balance support: Bilateral upper extremity supported, During functional activity, Reliant on assistive device for balance Standing balance-Leahy Scale: Zero Standing balance comment: total A of 2 to stand  ADL either performed or assessed with clinical judgement   ADL Overall ADL's : Needs assistance/impaired Eating/Feeding: Sitting;Maximal assistance   Grooming: Maximal assistance;Sitting   Upper Body Bathing: Maximal assistance;Sitting   Lower Body Bathing: Total assistance;+2 for physical assistance;+2 for safety/equipment   Upper Body Dressing : Maximal assistance;Sitting   Lower Body Dressing: Total assistance;+2 for physical assistance;+2 for safety/equipment   Toilet Transfer: Total assistance;+2 for physical assistance;+2 for safety/equipment;Squat-pivot   Toileting- Clothing Manipulation and Hygiene: Total assistance;+2 for physical assistance;+2 for safety/equipment;Bed level       Functional mobility during ADLs: Total assistance;+2 for physical assistance;+2 for safety/equipment;Cueing for safety;Cueing for sequencing General ADL Comments: Patient presenting with dense L hemiplegia, R gaze preference (with L eyelid also affected by stroke but can open with effort) and need for significant assist to complete all ADLs and transfers.     Vision Baseline Vision/History: 1 Wears glasses (Readers) Ability to See in Adequate Light: 0 Adequate Patient Visual Report: No change from baseline Vision Assessment?: Yes Eye Alignment: Impaired (comment) (Eyes shifted to the R) Ocular Range of Motion: Restricted on the left Alignment/Gaze Preference: Head tilt;Gaze right Tracking/Visual Pursuits: Impaired - to be further tested in functional context Saccades: Impaired - to be further tested in functional context Convergence: Impaired - to be further tested in functional context Visual Fields: Impaired-to be further tested in functional context Depth Perception: Overshoots Additional Comments: Will continue to assess, multiple stimuli and SLP present for eval to reassess swallowing     Perception     Praxis      Pertinent Vitals/Pain Pain Assessment Pain Assessment:  Faces Faces Pain Scale: Hurts a little bit Pain Location: headache Pain Descriptors / Indicators: Guarding, Grimacing, Headache Pain Intervention(s): Limited activity within patient's tolerance, Monitored during session, Repositioned     Hand Dominance Right   Extremity/Trunk Assessment Upper Extremity Assessment Upper Extremity Assessment: LUE deficits/detail LUE Deficits / Details: currently flaccid, does not feel pain LUE Sensation: decreased light touch;decreased proprioception LUE Coordination: decreased fine motor;decreased gross motor   Lower Extremity Assessment Lower Extremity Assessment: Defer to PT evaluation   Cervical / Trunk Assessment Cervical / Trunk Assessment: Other exceptions (Head tilted to the R)   Communication Communication Communication: No difficulties   Cognition Arousal/Alertness: Awake/alert Behavior During Therapy: WFL for tasks assessed/performed Overall Cognitive Status: Impaired/Different from baseline Area of Impairment: Following commands, Safety/judgement, Awareness, Problem solving                       Following Commands: Follows one step commands with increased time Safety/Judgement: Decreased awareness of safety, Decreased awareness of deficits Awareness: Emergent Problem Solving: Slow processing, Decreased initiation, Difficulty sequencing, Requires verbal cues, Requires tactile cues General Comments: Patient oriented, but requires increased cues to maintain attention to task, minimally perseverative/eager to drink water     General Comments  BP assessed over 160 perameters, BP medication given by RN in session.    Exercises     Shoulder Instructions      Home Living Family/patient expects to be discharged to:: Private residence Living Arrangements: Spouse/significant other Available Help at Discharge: Family Type of Home: House Home Access: Level entry     Home Layout: One level     Bathroom Shower/Tub:  Chief Strategy Officer: Standard     Home Equipment: Grab bars - tub/shower;Shower seat      Lives With: Spouse    Prior Functioning/Environment Prior Level of Function : Independent/Modified Independent;Driving  Mobility Comments: mows, weed eats, works outside a lot ADLs Comments: independent loves to fish        OT Problem List: Decreased strength;Decreased range of motion;Impaired balance (sitting and/or standing);Impaired vision/perception;Decreased coordination;Decreased cognition;Decreased safety awareness;Decreased knowledge of use of DME or AE;Decreased knowledge of precautions;Decreased activity tolerance;Impaired sensation;Impaired tone;Impaired UE functional use;Pain      OT Treatment/Interventions: Self-care/ADL training;Therapeutic exercise;Neuromuscular education;Energy conservation;DME and/or AE instruction;Manual therapy;Therapeutic activities;Cognitive remediation/compensation;Visual/perceptual remediation/compensation;Patient/family education;Balance training    OT Goals(Current goals can be found in the care plan section) Acute Rehab OT Goals Patient Stated Goal: to get some water OT Goal Formulation: Patient unable to participate in goal setting Time For Goal Achievement: 10/21/22 Potential to Achieve Goals: Good  OT Frequency: Min 2X/week    Co-evaluation PT/OT/SLP Co-Evaluation/Treatment: Yes Reason for Co-Treatment: Complexity of the patient's impairments (multi-system involvement);Necessary to address cognition/behavior during functional activity;For patient/therapist safety;To address functional/ADL transfers   OT goals addressed during session: ADL's and self-care;Strengthening/ROM      AM-PAC OT "6 Clicks" Daily Activity     Outcome Measure Help from another person eating meals?: A Lot Help from another person taking care of personal grooming?: A Lot Help from another person toileting, which includes using toliet,  bedpan, or urinal?: Total Help from another person bathing (including washing, rinsing, drying)?: Total Help from another person to put on and taking off regular upper body clothing?: A Lot Help from another person to put on and taking off regular lower body clothing?: Total 6 Click Score: 9   End of Session Equipment Utilized During Treatment: Gait belt Nurse Communication: Mobility status;Need for lift equipment  Activity Tolerance: Patient tolerated treatment well Patient left: in bed;with call bell/phone within reach;with bed alarm set;with family/visitor present  OT Visit Diagnosis: Unsteadiness on feet (R26.81);Other abnormalities of gait and mobility (R26.89);Muscle weakness (generalized) (M62.81);Other symptoms and signs involving the nervous system (R29.898);Other symptoms and signs involving cognitive function;Hemiplegia and hemiparesis;Pain Hemiplegia - Right/Left: Left Hemiplegia - dominant/non-dominant: Non-Dominant Hemiplegia - caused by: Nontraumatic intracerebral hemorrhage Pain - Right/Left:  (head) Pain - part of body:  (head)                Time: 4098-1191 OT Time Calculation (min): 43 min Charges:  OT General Charges $OT Visit: 1 Visit OT Evaluation $OT Eval Moderate Complexity: 1 Mod OT Treatments $Self Care/Home Management : 23-37 mins  Pollyann Glen E. Deeana Atwater, OTR/L Acute Rehabilitation Services [phone]   Cherlyn Cushing 10/07/2022, 1:33 PM

## 2022-10-07 NOTE — Progress Notes (Signed)
   Inpatient Rehab Admissions Coordinator :  Per therapy recommendations patient was screened for CIR candidacy by Kalden Wanke RN MSN. Patient is not yet at a level to tolerate the intensity required to pursue a CIR admit . Patient may have the potential to progress to become a candidate. The CIR admissions team will follow and monitor for progress and place a Rehab Consult order if felt to be appropriate. Please contact me with any questions.  Amy Gothard RN MSN Admissions Coordinator [phone]  

## 2022-10-07 NOTE — Progress Notes (Signed)
Pharmacy Antibiotic Note  Mario Smith is a 82 y.o. male admitted on 10/18/22 with ICH new concern for pneumonia.  Pharmacy has been consulted for Unasyn dosing.  Plan: Unasyn 3g Q6H. Follow culture data for de-escalation.  Monitor renal function for dose adjustments as indicated.   Weight: 78.8 kg (173 lb 11.6 oz)  Temp (24hrs), Avg:101 F (38.3 C), Min:99.3 F (37.4 C), Max:102.4 F (39.1 C)  Recent Labs  Lab 10-18-2022 1402 Oct 18, 2022 1409 10/18/22 1817 10/07/22 0545  WBC 6.6  --  6.7 8.7  CREATININE 1.22 1.20  --  1.30*  LATICACIDVEN  --   --   --  1.1    Estimated Creatinine Clearance: 41 mL/min (A) (by C-G formula based on SCr of 1.3 mg/dL (H)).    Allergies  Allergen Reactions   Simvastatin Other (See Comments)    Leg cramps   Sulfa Antibiotics     UNSPECIFIED REACTION    Codeine Nausea And Vomiting   Doxycycline Nausea And Vomiting   Hydrocodone Nausea And Vomiting   Meclizine Nausea Only   Niacin And Related Other (See Comments)    Flushing in the face and redness    Ondansetron Other (See Comments)    Tremors    Phenergan [Promethazine Hcl] Other (See Comments)    Tremors     Thank you for allowing pharmacy to be a part of this patient's care.  Estill Batten, PharmD, BCCCP  10/07/2022 8:22 AM

## 2022-10-07 NOTE — Progress Notes (Signed)
Pt has Na+ 169 from collection during 1st shift. Per RN handoff a redraw was collected, but this lab result not resulted as of 2010. Dr. Iver Nestle informed of lab result and STAT sodium re-collect order placed. Phlebotomy informed and I asked them to collect lab to ensure accurate reading.

## 2022-10-07 NOTE — Progress Notes (Addendum)
Speech Language Pathology Treatment: Dysphagia  Patient Details Name: Mario Smith MRN: [id number] DOB: 03-14-1941 Today's Date: 10/07/2022 Time: 1205-1228 SLP Time Calculation (min) (ACUTE ONLY): 23 min  Assessment / Plan / Recommendation Clinical Impression  Pt observed to be coughing, sometimes explosively, with PO intake this pm.  Pt was engaged in co-tx session with OT/PT upon entering room. He was sitting EOB with support, sleepy but able to accept PO intake.  Continues to present with left anterior spillage with poor recognition, intermittent oral holding, and intermittent coughing when swallowing thin liquids. He responded with improved consistency to turn head from right to midline and followed verbal commands, with delays, to attend to approaching spoon/straw.  Honey-thick liquids did not elicit cough response.    Recommend changing diet to dysphagia 1/honey-thick liquids for now. May have ice chips between meals. Discussed rationale with family. Signs over bed updated to reflect changes. SLP will continue to follow for swallowing/cognition.    HPI HPI: The pt is an 82 yo male presenting 5/1 with L-sided facial droop and slurred speech. Pt given TNK 15:01 5/1, developed headache, found to have large hemorrhagic conversion. Head CT: SAH right Sylvian fissure and scattered right sulci, interval extension into right-greater-than-left lateral  ventricles, with enlargement of the temporal and occipital horns,  concerning for hydrocephalus. Increased hypodensity and loss of gray-white differentiation in the right posterior frontal lobe, consistent with known infarct. PMH includes: COPD, dyspnea on exertion, HTN, graves disease, GERD, kidney stones, vertigo, arthritis, HOH wears bilateral HA.      SLP Plan  Continue with current plan of care      Recommendations for follow up therapy are one component of a multi-disciplinary discharge planning process, led by the attending physician.   Recommendations may be updated based on patient status, additional functional criteria and insurance authorization.    Recommendations  Diet recommendations: Dysphagia 1 (puree);Honey-thick liquid; may have ice chips between meals Liquids provided via: Cup;Straw;Teaspoon Medication Administration: Crushed with puree Supervision: Full supervision/cueing for compensatory strategies Compensations: Minimize environmental distractions;Slow rate;Small sips/bites                  Oral care BID   Frequent or constant Supervision/Assistance Dysphagia, oropharyngeal phase (R13.12)     Continue with current plan of care    Mandel Seiden L. Samson Frederic, MA CCC/SLP Clinical Specialist - Acute Care SLP Acute Rehabilitation Services Office number [phone]  [name]  10/07/2022, 12:35 PM

## 2022-10-07 NOTE — Evaluation (Signed)
Physical Therapy Evaluation Patient Details Name: Mario Smith MRN: [id number] DOB: 1941/05/06 Today's Date: 10/07/2022  History of Present Illness  The pt is an 82 yo male presenting 5/1 with L-sided facial droop and slurred speech. Pt given TNK 15:01 5/1, developed headache and was found to have hemorrhagic conversion. PMH includes: COPD, dyspnea on exertion, HTN, graves disease, kidney stones, vertigo, and arthritis.  Clinical Impression  Patient presents with decreased mobility due to decreased sitting balance, decreased midline awareness with R gaze preference, decreased strength L hemiplegia and poor activity tolerance.  He currently needs +2 A to get up to EOB and for lateral scoots along EOB toward HOB.  Patient previously active and working in the yard.  Family supportive so hopeful for progression to allow for d/c home following inpatient rehab stay.        Recommendations for follow up therapy are one component of a multi-disciplinary discharge planning process, led by the attending physician.  Recommendations may be updated based on patient status, additional functional criteria and insurance authorization.  Follow Up Recommendations       Assistance Recommended at Discharge Frequent or constant Supervision/Assistance  Patient can return home with the following  Two people to help with walking and/or transfers;Two people to help with bathing/dressing/bathroom;Help with stairs or ramp for entrance;Assist for transportation;Assistance with cooking/housework    Equipment Recommendations Other (comment) (TBA)  Recommendations for Other Services       Functional Status Assessment Patient has had a recent decline in their functional status and demonstrates the ability to make significant improvements in function in a reasonable and predictable amount of time.     Precautions / Restrictions Precautions Precautions: Fall Precaution Comments: SBP <160 Restrictions Weight Bearing  Restrictions: No      Mobility  Bed Mobility Overal bed mobility: Needs Assistance Bed Mobility: Sidelying to Sit, Sit to Sidelying   Sidelying to sit: Max assist, +2 for physical assistance, +2 for safety/equipment     Sit to sidelying: Total assist, +2 for physical assistance, +2 for safety/equipment General bed mobility comments: Able to initiate minimally with BLEs (trace noted at L hip flexor) but significant assist needed at the trunk. Patient with significant L lateral lean, able to sit for 20+ minutes EOB with heavy external assist from PT or OT. Total A of 2 to return to supine due to fatigue.    Transfers Overall transfer level: Needs assistance Equipment used: 2 person hand held assist Transfers: Sit to/from Stand Sit to Stand: Total assist, +2 physical assistance, +2 safety/equipment           General transfer comment: total A of 2 to complete incremental scooting after standing at EOB, minimal to no initiation noted, however patient extremely fatigued after sitting EOB for 20+ minutes    Ambulation/Gait                  Stairs            Wheelchair Mobility    Modified Rankin (Stroke Patients Only) Modified Rankin (Stroke Patients Only) Pre-Morbid Rankin Score: Moderate disability Modified Rankin: Severe disability     Balance Overall balance assessment: Modified Independent Sitting-balance support: Bilateral upper extremity supported, Feet supported Sitting balance-Leahy Scale: Zero Sitting balance - Comments: reliant on heavy external assist Postural control: Left lateral lean Standing balance support: Bilateral upper extremity supported, During functional activity, Reliant on assistive device for balance Standing balance-Leahy Scale: Zero Standing balance comment: total A of 2 to stand  Pertinent Vitals/Pain Pain Assessment Pain Assessment: Faces Faces Pain Scale: Hurts a little bit Pain  Location: headache Pain Descriptors / Indicators: Guarding, Grimacing, Headache Pain Intervention(s): Monitored during session, Limited activity within patient's tolerance    Home Living Family/patient expects to be discharged to:: Private residence Living Arrangements: Spouse/significant other Available Help at Discharge: Family Type of Home: House Home Access: Level entry       Home Layout: One level Home Equipment: Grab bars - tub/shower;Shower seat      Prior Function Prior Level of Function : Independent/Modified Independent;Driving             Mobility Comments: mows, weed eats, works outside a lot ADLs Comments: independent loves to fish     Hand Dominance   Dominant Hand: Right    Extremity/Trunk Assessment   Upper Extremity Assessment Upper Extremity Assessment: Defer to OT evaluation LUE Deficits / Details: currently flaccid, does not feel pain LUE Sensation: decreased light touch;decreased proprioception LUE Coordination: decreased fine motor;decreased gross motor    Lower Extremity Assessment Lower Extremity Assessment: LLE deficits/detail LLE Deficits / Details: some movement noted, but not antigravity    Cervical / Trunk Assessment Cervical / Trunk Assessment: Other exceptions (head tilted r)  Communication   Communication: No difficulties  Cognition Arousal/Alertness: Awake/alert Behavior During Therapy: WFL for tasks assessed/performed Overall Cognitive Status: Impaired/Different from baseline Area of Impairment: Following commands, Safety/judgement, Awareness, Problem solving                       Following Commands: Follows one step commands with increased time Safety/Judgement: Decreased awareness of safety, Decreased awareness of deficits Awareness: Emergent Problem Solving: Slow processing, Decreased initiation, Difficulty sequencing, Requires verbal cues, Requires tactile cues General Comments: Patient oriented, but requires  increased cues to maintain attention to task, minimally perseverative/eager to drink water        General Comments General comments (skin integrity, edema, etc.): BP over 160 in sitting, RN delivered meds during session    Exercises     Assessment/Plan    PT Assessment Patient needs continued PT services  PT Problem List Decreased strength;Decreased balance;Decreased cognition;Decreased knowledge of use of DME;Decreased mobility;Decreased activity tolerance;Decreased safety awareness;Impaired sensation       PT Treatment Interventions DME instruction;Functional mobility training;Balance training;Patient/family education;Therapeutic activities;Gait training;Therapeutic exercise;Cognitive remediation;Neuromuscular re-education    PT Goals (Current goals can be found in the Care Plan section)  Acute Rehab PT Goals Patient Stated Goal: to return home PT Goal Formulation: With patient/family Time For Goal Achievement: 10/21/22 Potential to Achieve Goals: Fair    Frequency Min 4X/week     Co-evaluation PT/OT/SLP Co-Evaluation/Treatment: Yes Reason for Co-Treatment: Complexity of the patient's impairments (multi-system involvement);Necessary to address cognition/behavior during functional activity;For patient/therapist safety;To address functional/ADL transfers PT goals addressed during session: Mobility/safety with mobility;Balance OT goals addressed during session: ADL's and self-care;Strengthening/ROM       AM-PAC PT "6 Clicks" Mobility  Outcome Measure Help needed turning from your back to your side while in a flat bed without using bedrails?: Total Help needed moving from lying on your back to sitting on the side of a flat bed without using bedrails?: Total Help needed moving to and from a bed to a chair (including a wheelchair)?: Total Help needed standing up from a chair using your arms (e.g., wheelchair or bedside chair)?: Total Help needed to walk in hospital room?:  Total Help needed climbing 3-5 steps with a railing? : Total 6 Click Score:  6    End of Session Equipment Utilized During Treatment: Gait belt Activity Tolerance: Patient limited by fatigue Patient left: in bed;with bed alarm set;with call bell/phone within reach;with family/visitor present Nurse Communication: Mobility status PT Visit Diagnosis: Other abnormalities of gait and mobility (R26.89);Hemiplegia and hemiparesis Hemiplegia - Right/Left: Left Hemiplegia - dominant/non-dominant: Non-dominant Hemiplegia - caused by: Cerebral infarction;Nontraumatic intracerebral hemorrhage    Time: 1146-1229 PT Time Calculation (min) (ACUTE ONLY): 43 min   Charges:   PT Evaluation $PT Eval Moderate Complexity: 1 Mod          Cyndi Desire Fulp, PT Acute Rehabilitation Services Office:[phone] 10/07/2022   Elray Mcgregor 10/07/2022, 4:44 PM

## 2022-10-07 NOTE — Consult Note (Addendum)
Initial Consultation Note   Patient: Mario Smith ZOX:[id number] DOB: [birth year] PCP: Nils Pyle, MD DOA: 10/09/22 DOS: the patient was seen and examined on 10/07/2022 Primary service: Stroke, Md, MD  Referring physician: Dr. Iver Nestle, Neurohospitalist/stroke team Reason for consult: Recurrent fevers.  Assessment/Plan: Recurrent fevers, Tmax greater than 102 concerning for possible active infective process Suspected left lower lobe community-acquired pneumonia, likely present on admission. UA negative for pyuria, MRSA screening test negative Personally reviewed chest x-ray done on 10/06/2022 showing left lower lobe infiltrates versus atelectasis. Obtain blood cultures x 2 peripherally and sputum culture if able, follow results. Obtain CBC with differential, CMP, procalcitonin, lactic acid. Rule out noninfectious causes of fever, follow TSH, random cortisol, lipase. Will cover empirically with Rocephin and IV azithromycin, plan for 5-day course of antibiotics to treat community-acquired pneumonia. Start use of incentive spirometer and pulmonary toilet if able  Intracranial bleed, could also be a cause of noninfectious fever Will need to rule out infectious cause of fever with presenting fever with Tmax of greater than 102 Continue to monitor fever curve and WBCs Treat fever with antipyretics and cooling blankets.   [facility] will continue to follow the patient.  Thank you for allowing [place] to participate in the care of this patient.  HPI: Mario Smith is a 82 y.o. 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  The patient developed a headache after TNK.  Repeat CT head revealed a large right-sided lobar hemorrhage with mass effect.  The patient received TNK reversal and hypertonic saline was started.  He was transferred to ICU  under neurology care.  The patient had a repeat noncontrast CT head on 10/06/2022 which revealed the following findings:  1. Right hemisphere intra-axial Hemorrhage superimposed on MCA territory infarct is stable since last night, intra-axial blood volume estimated at 75-85 mL. 2. Moderate volume right greater than left Intraventricular And Subarachnoid blood is stable. 3. No acute ventriculomegaly. No midline shift. No new intracranial abnormality.   His hospital course has been complicated by fevers with Tmax 102.4 on 10/06/2022 at 1600.  He had a recurrent fever overnight 10/07/2022 0200 with a Tmax of 101.3.  TR[facility], hospitalist service, was asked to consult due to recurrent fevers.  Upon evaluation, the patient is somnolent.  No reported specific complaints.  Has had an intermittent mild nonproductive cough.  Chest x-ray personally reviewed with possible left lower lobe infiltrates versus atelectasis.  UA negative for pyuria.  No leukocytosis.  No tachycardia.  MRSA screening test was negative.    Peripheral blood cultures x 2 ordered.  Suspected possible left lower lobe community-acquired pneumonia, likely present on admission.  Will cover empirically with Rocephin and IV azithromycin until active infective process can be ruled out.  Review of Systems: As mentioned in the history of present illness. All other systems reviewed and are negative. Past Medical History:  Diagnosis Date   Abdominal pain 10/10/2017   Arthritis    Back pain    Colon polyp 2011   tubular adenoma.  no HG dysplasia.     Constipation    COPD (chronic obstructive pulmonary disease) (HCC)    no per pt   Dehydration 10/10/2017   Dyspnea    with exertion   ED (erectile dysfunction)    Essential hypertension, benign    Gallbladder sludge 10/2017   GERD (gastroesophageal reflux  disease)    acute gastritis   Graves disease    History of kidney stones    HTN (hypertension)    Other and unspecified hyperlipidemia     PONV (postoperative nausea and vomiting)    Pt's wife also said that when he had a nerve block in 2015 here, pt had a reaction immediately. 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- finished medication   Vertigo    Past Surgical History:  Procedure Laterality Date   CATARACT EXTRACTION Bilateral    CHOLECYSTECTOMY N/A 01/18/2018   Procedure: LAPAROSCOPIC CHOLECYSTECTOMY WITH INTRAOPERATIVE CHOLANGIOGRAM ERAS PATHWAY;  Surgeon: Griselda Miner, MD;  Location: [facility];  Service: General;  Laterality: N/A;   COLONOSCOPY  03/2010    Dr Walker Kehr, [facility]. Higher risk screening study, sister's hx of colon CA.     ESOPHAGOGASTRODUODENOSCOPY (EGD) WITH PROPOFOL N/A 10/11/2017   Procedure: ESOPHAGOGASTRODUODENOSCOPY (EGD) WITH PROPOFOL;  Surgeon: Napoleon Form, MD;  Location: [facility] ENDOSCOPY;  Service: Endoscopy;  Laterality: N/A;   EYE SURGERY     lid-lift   SPINAL FUSION  2001   cervical   TENDON REPAIR Right 05/16/2014   Procedure: RIGHT RING FINGER FLEXOR TENDON REPAIR WITH PULL OUT BUTTON;  Surgeon: Dominica Severin, MD;  Location: [facility];  Service: Orthopedics;  Laterality: Right;   UPPER GASTROINTESTINAL ENDOSCOPY     Social History:  reports that he quit smoking about 29 years ago. His smoking use included cigarettes. He quit smokeless tobacco use about 29 years ago.  His smokeless tobacco use included chew. He reports that he does not currently use alcohol. He reports that he does not use drugs.  Allergies  Allergen Reactions   Simvastatin Other (See Comments)    Leg cramps   Sulfa Antibiotics     UNSPECIFIED REACTION    Codeine Nausea And Vomiting   Doxycycline Nausea And Vomiting   Hydrocodone Nausea And Vomiting   Meclizine Nausea Only   Niacin And Related Other (See Comments)    Flushing in the face and redness    Ondansetron Other (See  Comments)    Tremors    Phenergan [Promethazine Hcl] Other (See Comments)    Tremors     Family History  Problem Relation Age of Onset   Heart disease Mother    COPD Mother    Heart disease Father    COPD Father    Alcohol abuse Father    Cancer Sister        colon cancer   Emphysema Sister    Colon cancer Sister    Cancer Brother        prostate   Emphysema Sister    Prostate cancer Brother    Healthy Daughter    Esophageal cancer Neg Hx    Stomach cancer Neg Hx    Rectal cancer Neg Hx     Prior to Admission medications   Medication Sig Start Date End Date Taking? Authorizing Provider  albuterol (VENTOLIN HFA) 108 (90 Base) MCG/ACT inhaler Inhale 2 puffs into the lungs every 6 (six) hours as needed for wheezing or shortness of breath. 06/26/20  Yes Gabriel Earing, FNP  aspirin 81 MG EC tablet Take 81 mg by mouth daily.   Yes [provider]  carvedilol (COREG) 6.25 MG tablet Take 1 tablet (6.25 mg total) by  mouth 2 (two) times daily with a meal. 08/26/22  Yes Dettinger, Elige Radon, MD  Cholecalciferol (VITAMIN D3) 2000 UNITS TABS Take 2,000 Units by mouth daily.   Yes [provider]  Coenzyme Q10 (CO Q 10) 100 MG CAPS Take 100 mg by mouth daily.   Yes [provider]  methimazole (TAPAZOLE) 5 MG tablet Take 1 tablet (5 mg total) by mouth 2 (two) times a week. 12/27/21  Yes Dettinger, Elige Radon, MD  Omega-3 Fatty Acids (FISH OIL) 1000 MG CAPS Take 1,000 mg by mouth daily.   Yes [provider]  pantoprazole (PROTONIX) 40 MG tablet Take 1 tablet (40 mg total) by mouth 2 (two) times daily. 12/24/21  Yes Dettinger, Elige Radon, MD  rosuvastatin (CRESTOR) 10 MG tablet Take 1 tablet (10 mg total) by mouth daily. 12/24/21  Yes Dettinger, Elige Radon, MD  telmisartan (MICARDIS) 40 MG tablet Take 0.5 tablets (20 mg total) by mouth daily. 12/24/21  Yes Dettinger, Elige Radon, MD  tamsulosin (FLOMAX) 0.4 MG CAPS capsule Take 1 capsule (0.4 mg total) by mouth  daily. Patient not taking: Reported on Nov 03, 2022 12/24/21   Dettinger, Elige Radon, MD    Physical Exam: Vitals:   10/07/22 0000 10/07/22 0100 10/07/22 0200 10/07/22 0300  BP: (!) 152/74 (!) 154/74 (!) 156/68 133/71  Pulse: 70 64 70 92  Resp: (!) 24 19 17 17   Temp:   (!) 101.3 F (38.5 C)   TempSrc:   Axillary   SpO2: 99% 97% 99% 97%  Weight:       Developed well-nourished in no acute distress.  He is somnolent. Regular rate and rhythm no rubs or gallops. Mild rales at bases.  Poor respiratory effort. Abdomen is mildly distended.  Bowel sounds present.  Does not appear to be tender on palpation. Lower extremity no pitting edema.  Data Reviewed:  Labs reviewed.  Family Communication: Family member in the room. Primary team communication: Discussed the case with the patient's RN at bedside.  Thank you very much for involving [place] in the care of your patient.  We will continue to see the patient along with you.   Time: 80 minutes.   Author: Darlin Drop, DO 10/07/2022 3:52 AM  For on call review [email or web address].

## 2022-10-08 ENCOUNTER — Inpatient Hospital Stay (HOSPITAL_COMMUNITY): Payer: No Typology Code available for payment source

## 2022-10-08 DIAGNOSIS — I619 Nontraumatic intracerebral hemorrhage, unspecified: Secondary | ICD-10-CM

## 2022-10-08 DIAGNOSIS — J9811 Atelectasis: Secondary | ICD-10-CM | POA: Diagnosis not present

## 2022-10-08 DIAGNOSIS — R509 Fever, unspecified: Secondary | ICD-10-CM | POA: Diagnosis not present

## 2022-10-08 DIAGNOSIS — J9601 Acute respiratory failure with hypoxia: Secondary | ICD-10-CM

## 2022-10-08 DIAGNOSIS — I672 Cerebral atherosclerosis: Secondary | ICD-10-CM | POA: Diagnosis not present

## 2022-10-08 DIAGNOSIS — Z4659 Encounter for fitting and adjustment of other gastrointestinal appliance and device: Secondary | ICD-10-CM | POA: Diagnosis not present

## 2022-10-08 DIAGNOSIS — J9690 Respiratory failure, unspecified, unspecified whether with hypoxia or hypercapnia: Secondary | ICD-10-CM | POA: Diagnosis not present

## 2022-10-08 DIAGNOSIS — E870 Hyperosmolality and hypernatremia: Secondary | ICD-10-CM | POA: Diagnosis not present

## 2022-10-08 DIAGNOSIS — I639 Cerebral infarction, unspecified: Secondary | ICD-10-CM | POA: Diagnosis not present

## 2022-10-08 DIAGNOSIS — Z9911 Dependence on respirator [ventilator] status: Secondary | ICD-10-CM | POA: Diagnosis not present

## 2022-10-08 LAB — POCT I-STAT 7, (LYTES, BLD GAS, ICA,H+H)
Acid-Base Excess: 0 mmol/L (ref 0.0–2.0)
Bicarbonate: 25.4 mmol/L (ref 20.0–28.0)
Calcium, Ion: 1.28 mmol/L (ref 1.15–1.40)
HCT: 30 % — ABNORMAL LOW (ref 39.0–52.0)
Hemoglobin: 10.2 g/dL — ABNORMAL LOW (ref 13.0–17.0)
O2 Saturation: 100 %
Potassium: 3.3 mmol/L — ABNORMAL LOW (ref 3.5–5.1)
Sodium: 155 mmol/L — ABNORMAL HIGH (ref 135–145)
TCO2: 27 mmol/L (ref 22–32)
pCO2 arterial: 41.1 mmHg (ref 32–48)
pH, Arterial: 7.398 (ref 7.35–7.45)
pO2, Arterial: 399 mmHg — ABNORMAL HIGH (ref 83–108)

## 2022-10-08 LAB — SODIUM
Sodium: 155 mmol/L — ABNORMAL HIGH (ref 135–145)
Sodium: 154 mmol/L — ABNORMAL HIGH (ref 135–145)
Sodium: 152 mmol/L — ABNORMAL HIGH (ref 135–145)

## 2022-10-08 LAB — GLUCOSE, CAPILLARY
Glucose-Capillary: 106 mg/dL — ABNORMAL HIGH (ref 70–99)
Glucose-Capillary: 110 mg/dL — ABNORMAL HIGH (ref 70–99)
Glucose-Capillary: 145 mg/dL — ABNORMAL HIGH (ref 70–99)
Glucose-Capillary: 126 mg/dL — ABNORMAL HIGH (ref 70–99)

## 2022-10-08 MED ORDER — IRBESARTAN 150 MG PO TABS
300.0000 mg | ORAL_TABLET | Freq: Every day | ORAL | Status: DC
  Filled 2022-10-08: qty 2

## 2022-10-08 MED ORDER — ETOMIDATE 2 MG/ML IV SOLN
20.0000 mg | Freq: Once | INTRAVENOUS | Status: AC
  Administered 2022-10-08: 20 mg via INTRAVENOUS

## 2022-10-08 MED ORDER — OSMOLITE 1.5 CAL PO LIQD
1000.0000 mL | ORAL | Status: DC
  Administered 2022-10-08 – 2022-10-12 (×4): 1000 mL

## 2022-10-08 MED ORDER — CARVEDILOL 3.125 MG PO TABS
6.2500 mg | ORAL_TABLET | Freq: Two times a day (BID) | ORAL | Status: DC
  Administered 2022-10-08 – 2022-10-11 (×7): 6.25 mg
  Filled 2022-10-08 (×6): qty 2

## 2022-10-08 MED ORDER — SENNOSIDES-DOCUSATE SODIUM 8.6-50 MG PO TABS
1.0000 | ORAL_TABLET | Freq: Two times a day (BID) | ORAL | Status: DC
  Administered 2022-10-08 – 2022-10-10 (×5): 1
  Filled 2022-10-08 (×4): qty 1

## 2022-10-08 MED ORDER — FENTANYL CITRATE PF 50 MCG/ML IJ SOSY
PREFILLED_SYRINGE | INTRAMUSCULAR | Status: AC
  Filled 2022-10-08: qty 1

## 2022-10-08 MED ORDER — ETOMIDATE 2 MG/ML IV SOLN
INTRAVENOUS | Status: AC
  Filled 2022-10-08: qty 20

## 2022-10-08 MED ORDER — ROCURONIUM BROMIDE 50 MG/5ML IV SOLN
100.0000 mg | Freq: Once | INTRAVENOUS | Status: AC
  Administered 2022-10-08: 100 mg via INTRAVENOUS

## 2022-10-08 MED ORDER — FENTANYL CITRATE PF 50 MCG/ML IJ SOSY
25.0000 ug | PREFILLED_SYRINGE | INTRAMUSCULAR | Status: DC | PRN
  Administered 2022-10-08 – 2022-10-09 (×3): 50 ug via INTRAVENOUS
  Filled 2022-10-08 (×2): qty 1

## 2022-10-08 MED ORDER — ROCURONIUM BROMIDE 10 MG/ML (PF) SYRINGE
PREFILLED_SYRINGE | INTRAVENOUS | Status: AC
  Filled 2022-10-08: qty 10

## 2022-10-08 MED ORDER — ORAL CARE MOUTH RINSE: 15.0000 mL | OROMUCOSAL | Status: DC | PRN

## 2022-10-08 MED ORDER — DOCUSATE SODIUM 50 MG/5ML PO LIQD
100.0000 mg | Freq: Two times a day (BID) | ORAL | Status: DC
  Administered 2022-10-08 – 2022-10-09 (×2): 100 mg
  Filled 2022-10-08 (×2): qty 10

## 2022-10-08 MED ORDER — PROSOURCE TF20 ENFIT COMPATIBL EN LIQD
60.0000 mL | Freq: Every day | ENTERAL | Status: DC
  Administered 2022-10-08 – 2022-10-11 (×4): 60 mL
  Filled 2022-10-08 (×4): qty 60

## 2022-10-08 MED ORDER — POLYETHYLENE GLYCOL 3350 17 G PO PACK
17.0000 g | PACK | Freq: Every day | ORAL | Status: DC
  Administered 2022-10-08 – 2022-10-09 (×2): 17 g
  Filled 2022-10-08 (×2): qty 1

## 2022-10-08 MED ORDER — ORAL CARE MOUTH RINSE
15.0000 mL | OROMUCOSAL | Status: DC
  Administered 2022-10-08 – 2022-10-12 (×47): 15 mL via OROMUCOSAL

## 2022-10-08 MED ORDER — IRBESARTAN 150 MG PO TABS
300.0000 mg | ORAL_TABLET | Freq: Every day | ORAL | Status: DC
  Administered 2022-10-08 – 2022-10-11 (×4): 300 mg
  Filled 2022-10-08 (×3): qty 2

## 2022-10-08 MED ORDER — VITAL HIGH PROTEIN PO LIQD: 1000.0000 mL | ORAL | Status: DC

## 2022-10-08 MED ORDER — FENTANYL CITRATE PF 50 MCG/ML IJ SOSY
PREFILLED_SYRINGE | INTRAMUSCULAR | Status: AC
  Filled 2022-10-08: qty 2

## 2022-10-08 MED ORDER — MIDAZOLAM HCL 2 MG/2ML IJ SOLN
INTRAMUSCULAR | Status: AC
  Filled 2022-10-08: qty 2

## 2022-10-08 MED ORDER — FENTANYL CITRATE PF 50 MCG/ML IJ SOSY: 25.0000 ug | PREFILLED_SYRINGE | INTRAMUSCULAR | Status: DC | PRN

## 2022-10-08 MED ORDER — DEXMEDETOMIDINE HCL IN NACL 400 MCG/100ML IV SOLN
0.0000 ug/kg/h | INTRAVENOUS | Status: DC
  Administered 2022-10-08 (×2): 0.4 ug/kg/h via INTRAVENOUS
  Administered 2022-10-09: 0.7 ug/kg/h via INTRAVENOUS
  Administered 2022-10-09: 0.4 ug/kg/h via INTRAVENOUS
  Administered 2022-10-10: 0.5 ug/kg/h via INTRAVENOUS
  Administered 2022-10-10: 0.6 ug/kg/h via INTRAVENOUS
  Administered 2022-10-11: 0.4 ug/kg/h via INTRAVENOUS
  Filled 2022-10-08 (×6): qty 100

## 2022-10-08 MED ORDER — DEXMEDETOMIDINE HCL IN NACL 400 MCG/100ML IV SOLN
INTRAVENOUS | Status: AC
  Filled 2022-10-08: qty 100

## 2022-10-08 MED ORDER — AMLODIPINE BESYLATE 10 MG PO TABS
10.0000 mg | ORAL_TABLET | Freq: Every day | ORAL | Status: DC
  Administered 2022-10-08 – 2022-10-11 (×4): 10 mg
  Filled 2022-10-08 (×3): qty 1

## 2022-10-08 MED ORDER — SODIUM CHLORIDE 3 % IV SOLN
INTRAVENOUS | Status: DC
  Filled 2022-10-08: qty 500

## 2022-10-08 MED ORDER — METHIMAZOLE 5 MG PO TABS
5.0000 mg | ORAL_TABLET | ORAL | Status: DC
  Administered 2022-10-10: 5 mg
  Filled 2022-10-08: qty 1

## 2022-10-08 NOTE — Progress Notes (Signed)
Initial Nutrition Assessment  DOCUMENTATION CODES:   Not applicable  INTERVENTION:   -TF via NGT:  Osmolite 1.5 @ 50 ml/hr (1200 ml daily)  60 ml Prosource TF daily  Tube feeding regimen provides 1880 kcal (100% of needs), 95 grams of protein, and 914 ml of H2O.    NUTRITION DIAGNOSIS:   Inadequate oral intake related to dysphagia as evidenced by NPO status.  GOAL:   Patient will meet greater than or equal to 90% of their needs  MONITOR:   Diet advancement, TF tolerance  REASON FOR ASSESSMENT:   Consult Enteral/tube feeding initiation and management  ASSESSMENT:   Pt with history of hypertension, hyperlipidemia, thrombocytopenia, Graves' disease with exophthalmos, Mnire's disease, hard of hearing at baseline uses hearing aids, cervical spine fusion, latent tuberculosis s/p treatment, COPD  presenting with ischemic stroke and secondary R cortical ICH secondary to TNK.  Pt admitted with stroke (rt MCA infarct complicated by large parenchymal hemorrhagic infarct with IVH).   5/2- s/p BSE- NPO; s/p MBSS- dysphagia 1 diet with thin liquids 5/4- NGT placed- tip of tube in proximal stomach and side port just below GE junction per KUB on 10/08/22  Reviewed I/O's: +100 ml x 24 hours and +1.8 L since admission   Pt made NPO this morning by neurology due to concern for aspiration.   Case discussed with RN. Pt is NPO due to concern for aspiration and NGT has been placed; plant o start TF today.   Pt previously on a dysphagia 1 diet with thin liquids, but with poor oral intake. Documented meal completions 10%.   Reviewed wt hx; wt has been stable over the past 9 months.   CIR team following for potential admit.   Medications reviewed and include senokot and precedex.   Lab Results  Component Value Date   HGBA1C 5.4 10/06/2022   PTA DM medications are none.   Labs reviewed: Na: 154, CBGS: 106 (inpatient orders for glycemic control are none).    Diet Order:   Diet  Order             Diet NPO time specified  Diet effective now                   EDUCATION NEEDS:   No education needs have been identified at this time  Skin:  Skin Assessment: Reviewed RN Assessment  Last BM:  Unknown  Height:   Ht Readings from Last 1 Encounters:  09/26/22 5\' 7"  (1.702 m)    Weight:   Wt Readings from Last 1 Encounters:  Oct 29, 2022 78.8 kg    Ideal Body Weight:  67.3 kg  BMI:  Body mass index is 27.21 kg/m.  Estimated Nutritional Needs:   Kcal:  1750-1950  Protein:  90-105 grams  Fluid:  > 1.7 L    Levada Schilling, RD, LDN, CDCES Registered Dietitian II Certified Diabetes Care and Education Specialist Please refer to [facility] for RD and/or RD on-call/weekend/after hours pager

## 2022-10-08 NOTE — Progress Notes (Signed)
Mario Smith, MRN:  [id number], DOB:  1941/05/10, LOS: 3 ADMISSION DATE:  2022/10/29, CONSULTATION DATE: 10/07/2022 REFERRING MD: Dr. Roda Shutters, CHIEF COMPLAINT: Fever  History of Present Illness:  82 year old male with hypertension, hyperlipidemia and tuberculosis status posttreatment who was admitted with acute right MCA stroke, received TNK, complicated with hemorrhagic conversion leading to large intraparenchymal hemorrhage on right side.  Patient was started on 3% hypertonic saline.  He started spiking fever with Tmax 102, PCCM was consulted for help evaluation medical management  Patient denies chest pain, shortness of breath, nausea, vomiting, cough and dysuria  Pertinent  Medical History   Past Medical History:  Diagnosis Date   Abdominal pain 10/10/2017   Arthritis    Back pain    Colon polyp 2011   tubular adenoma.  no HG dysplasia.     Constipation    COPD (chronic obstructive pulmonary disease) (HCC)    no per pt   Dehydration 10/10/2017   Dyspnea    with exertion   ED (erectile dysfunction)    Essential hypertension, benign    Gallbladder sludge 10/2017   GERD (gastroesophageal reflux disease)    acute gastritis   Graves disease    History of kidney stones    HTN (hypertension)    Other and unspecified hyperlipidemia    PONV (postoperative nausea and vomiting)    Pt's wife also said that when he had a nerve block in 2015 here, pt had a reaction immediately. 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- finished medication   Vertigo      Significant Hospital Events: Including procedures, antibiotic start and stop dates in addition to other pertinent events     Interim History / Subjective:  Patient continued to spike fever, Tmax 100.8 Remain hypertensive  Objective   Blood pressure (!) 150/87, pulse 94, temperature 100.2 F (37.9 C), temperature  source Rectal, resp. rate 20, weight 78.8 kg, SpO2 95 %.        Intake/Output Summary (Last 24 hours) at 10/08/2022 0739 Last data filed at 10/08/2022 0500 Gross per 24 hour  Intake 526.39 ml  Output 960 ml  Net -433.61 ml   Filed Weights   10/29/2022 1418  Weight: 78.8 kg    Examination: Physical exam: General: Acute on chronically ill-appearing male, lying on the bed HEENT: [place]/AT, proptosis, eyes anicteric.  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 Complicated with hemorrhagic conversion leading to acute large left parietal intraparenchymal hemorrhage with IVH Continue neuro watch every hour Stroke team is following Continue secondary stroke prophylaxis Holding antiplatelet and anticoagulation  Cerebral edema with brain compression Induced hypernatremia/hyperchloremia Patient serum sodium trended up to 155 3% hypertonic saline is on hold Monitor serum sodium every 6 hours Keep head end of the bed elevated >30 Maintain euthymia Seizure precautions  Fever Patient started spiking fever with Tmax 102 He continued to spike fever to fever trajectory is coming  down, now Tmax 100.8 He was cleared by speech and swallow for dysphagia diet but he is gurgling Still differential of fever could be aspiration pneumonia versus central fever Procalcitonin was negative yesterday Blood and respiratory cultures are pending Continue IV Unasyn  Paroxysmal A-fib with controlled rate Patient heart rate is controlled, he remains in A-fib,  likely that is the reason for acute stroke Continue Coreg Holding anticoagulation in the setting of intraparenchymal hemorrhage Continue telemetry monitoring  Hypertension Patient blood pressure is not well-controlled Continue amlodipine and Coreg Increase irbesartan dose to 300 mg once daily  CKD stage IIIa Serum creatinine is at baseline, monitor intake and output Monitor serum creatinine Avoid nephrotoxic agents  Anemia and thrombocytopenia of critical illness Monitor H&H and platelet count  Best Practice (right click and "Reselect all SmartList Selections" daily)   Diet/type: dysphagia diet (see orders) DVT prophylaxis: SCD GI prophylaxis: PPI Lines: Central line Foley:  Yes, and it is still needed Code Status:  DNR Last date of multidisciplinary goals of care discussion [5/3 patient wife and daughter were updated at bedside]  Labs   CBC: Recent Labs  Lab 2022-10-23 1402 10/23/22 1409 Oct 23, 2022 1817 10/07/22 0545  WBC 6.6  --  6.7 8.7  NEUTROABS 4.5  --   --  6.6  HGB 15.1 15.0 14.3 12.3*  HCT 45.3 44.0 43.6 38.0*  MCV 93.2  --  94.2 96.4  PLT 131*  --  126* 102*    Basic Metabolic Panel: Recent Labs  Lab Oct 23, 2022 1402 10-23-2022 1409 Oct 23, 2022 1817 10/06/22 0617 10/06/22 1243 10/07/22 0545 10/07/22 1240 10/07/22 1749 10/07/22 2016 10/08/22 0201  NA 135 140   < > 144   < > 154* 153* 169* 153* 155*  K 3.9 4.1  --   --   --  3.8  --   --   --   --   CL 104 107  --   --   --  122*  --   --   --   --   CO2 24  --   --   --   --  21*  --   --   --   --   GLUCOSE 103* 102*  --   --   --  125*  --   --   --   --   BUN 19 20  --   --   --  13  --   --   --   --   CREATININE 1.22 1.20  --   --   --  1.30*  --   --   --   --   CALCIUM 8.7*  --   --   --   --  8.8*  --   --   --   --   MG  --   --   --  2.1  --   --   --   --   --   --    < > = values in this interval not displayed.   GFR: Estimated Creatinine Clearance: 41 mL/min (A) (by C-G formula based on  SCr of 1.3 mg/dL (H)). Recent Labs  Lab 10-23-2022 1402 10/23/2022 1817 10/07/22 0545  PROCALCITON  --   --  <0.10  WBC 6.6 6.7 8.7  LATICACIDVEN  --   --  1.1    Liver Function Tests: Recent Labs  Lab 2022-10-23 1402 10/07/22 0545  AST 20 17  ALT 16 14  ALKPHOS 63 38  BILITOT 0.9 0.9  PROT 6.9 6.0*  ALBUMIN 3.9 3.2*   Recent Labs  Lab 10/07/22 0545  LIPASE 38   No results for input(s): "AMMONIA" in the last 168 hours.  ABG    Component Value Date/Time   TCO2 24 10/16/2022 1409     Coagulation Profile: Recent Labs  Lab 10-16-2022 1402 Oct 16, 2022 1817  INR 0.9 1.0    Cardiac Enzymes: No results for input(s): "CKTOTAL", "CKMB", "CKMBINDEX", "TROPONINI" in the last 168 hours.  HbA1C: Hgb A1c MFr Bld  Date/Time Value Ref Range Status  10/06/2022 06:17 AM 5.4 4.8 - 5.6 % Final    Comment:    (NOTE) Pre diabetes:          5.7%-6.4%  Diabetes:              >6.4%  Glycemic control for   <7.0% adults with diabetes     CBG: Recent Labs  Lab 10/16/22 1401  GLUCAP 96    Review of Systems:   12 point review of system is significant for complaint mentioned HPI, rest negative  Past Medical History:  He,  has a past medical history of Abdominal pain (10/10/2017), Arthritis, Back pain, Colon polyp (2011), Constipation, COPD (chronic obstructive pulmonary disease) (HCC), Dehydration (10/10/2017), Dyspnea, ED (erectile dysfunction), Essential hypertension, benign, Gallbladder sludge (10/2017), GERD (gastroesophageal reflux disease), Graves disease, History of kidney stones, HTN (hypertension), Other and unspecified hyperlipidemia, PONV (postoperative nausea and vomiting), Thrombocytopenia (HCC), Tuberculosis, and Vertigo.   Surgical History:   Past Surgical History:  Procedure Laterality Date   CATARACT EXTRACTION Bilateral    CHOLECYSTECTOMY N/A 01/18/2018   Procedure: LAPAROSCOPIC CHOLECYSTECTOMY WITH INTRAOPERATIVE CHOLANGIOGRAM ERAS PATHWAY;  Surgeon: Griselda Miner, MD;  Location: [facility] OR;  Service: General;  Laterality: N/A;   COLONOSCOPY  03/2010    Dr Walker Kehr, [facility]. Higher risk screening study, sister's hx of colon CA.     ESOPHAGOGASTRODUODENOSCOPY (EGD) WITH PROPOFOL N/A 10/11/2017   Procedure: ESOPHAGOGASTRODUODENOSCOPY (EGD) WITH PROPOFOL;  Surgeon: Napoleon Form, MD;  Location: [facility];  Service: Endoscopy;  Laterality: N/A;   EYE SURGERY     lid-lift   SPINAL FUSION  2001   cervical   TENDON REPAIR Right 05/16/2014   Procedure: RIGHT RING FINGER FLEXOR TENDON REPAIR WITH PULL OUT BUTTON;  Surgeon: Dominica Severin, MD;  Location: [facility];  Service: Orthopedics;  Laterality: Right;   UPPER GASTROINTESTINAL ENDOSCOPY       Social History:   reports that he quit smoking about 29 years ago. His smoking use included cigarettes. He quit smokeless tobacco use about 29 years ago.  His smokeless tobacco use included chew. He reports that he does not currently use alcohol. He reports that he does not use drugs.   Family History:  His family history includes Alcohol abuse in his father; COPD in his father and mother; Cancer in his brother and sister; Colon cancer in his sister; Emphysema in his sister and sister; Healthy in his daughter; Heart disease in his father and mother; Prostate cancer in his brother. There is no history of Esophageal cancer, Stomach cancer, or Rectal cancer.   Allergies Allergies  Allergen Reactions   Simvastatin Other (See Comments)    Leg cramps   Sulfa Antibiotics     UNSPECIFIED REACTION    Codeine Nausea And Vomiting   Doxycycline Nausea And Vomiting   Hydrocodone Nausea And Vomiting   Meclizine Nausea Only   Niacin  And Related Other (See Comments)    Flushing in the face and redness    Ondansetron Other (See Comments)    Tremors    Phenergan [Promethazine Hcl] Other (See Comments)    Tremors      Home Medications  Prior to Admission medications   Medication Sig Start  Date End Date Taking? Authorizing Provider  albuterol (VENTOLIN HFA) 108 (90 Base) MCG/ACT inhaler Inhale 2 puffs into the lungs every 6 (six) hours as needed for wheezing or shortness of breath. 06/26/20  Yes Gabriel Earing, FNP  aspirin 81 MG EC tablet Take 81 mg by mouth daily.   Yes [provider]  carvedilol (COREG) 6.25 MG tablet Take 1 tablet (6.25 mg total) by mouth 2 (two) times daily with a meal. 08/26/22  Yes Dettinger, Elige Radon, MD  Cholecalciferol (VITAMIN D3) 2000 UNITS TABS Take 2,000 Units by mouth daily.   Yes [provider]  Coenzyme Q10 (CO Q 10) 100 MG CAPS Take 100 mg by mouth daily.   Yes [provider]  methimazole (TAPAZOLE) 5 MG tablet Take 1 tablet (5 mg total) by mouth 2 (two) times a week. 12/27/21  Yes Dettinger, Elige Radon, MD  Omega-3 Fatty Acids (FISH OIL) 1000 MG CAPS Take 1,000 mg by mouth daily.   Yes [provider]  pantoprazole (PROTONIX) 40 MG tablet Take 1 tablet (40 mg total) by mouth 2 (two) times daily. 12/24/21  Yes Dettinger, Elige Radon, MD  rosuvastatin (CRESTOR) 10 MG tablet Take 1 tablet (10 mg total) by mouth daily. 12/24/21  Yes Dettinger, Elige Radon, MD  telmisartan (MICARDIS) 40 MG tablet Take 0.5 tablets (20 mg total) by mouth daily. 12/24/21  Yes Dettinger, Elige Radon, MD  tamsulosin (FLOMAX) 0.4 MG CAPS capsule Take 1 capsule (0.4 mg total) by mouth daily. Patient not taking: Reported on 2022-10-29 12/24/21   Dettinger, Elige Radon, MD     Critical care time:     This patient is critically ill with multiple organ system failure which requires frequent high complexity decision making, assessment, support, evaluation, and titration of therapies. This was completed through the application of advanced monitoring technologies and extensive interpretation of multiple databases.  During this encounter critical care time was devoted to patient care services described in this note for 34 minutes.    Cheri Fowler, MD [place]  [facility] Critical [facility] See Amion for pager If no response to pager, please call [phone] until 7pm After 7pm, Please call E-link [phone]

## 2022-10-08 NOTE — Progress Notes (Addendum)
STROKE TEAM PROGRESS NOTE   INTERVAL HISTORY Patient is seen in his room with no family at the bedside today.  He has been hemodynamically stable.  His sodium is within goal at 155 this morning.  Will continue to follow.  There has been some concern for aspiration, so patient was made n.p.o. and NG tube was inserted for feedings.  Vitals:   10/08/22 0600 10/08/22 0700 10/08/22 0800 10/08/22 0900  BP: (!) 149/94 (!) 150/87 (!) 152/83 116/88  Pulse: 97 94 100 (!) 106  Resp: (!) 22 20 (!) 22 (!) 23  Temp:      TempSrc:      SpO2: 93% 95% 94% 98%  Weight:       CBC:  Recent Labs  Lab 10/13/22 1402 10-13-2022 1409 Oct 13, 2022 1817 10/07/22 0545  WBC 6.6  --  6.7 8.7  NEUTROABS 4.5  --   --  6.6  HGB 15.1   < > 14.3 12.3*  HCT 45.3   < > 43.6 38.0*  MCV 93.2  --  94.2 96.4  PLT 131*  --  126* 102*   < > = values in this interval not displayed.    Basic Metabolic Panel:  Recent Labs  Lab Oct 13, 2022 1402 10/13/2022 1409 2022/10/13 1817 10/06/22 0617 10/06/22 1243 10/07/22 0545 10/07/22 1240 10/07/22 2016 10/08/22 0201  NA 135 140   < > 144   < > 154*   < > 153* 155*  K 3.9 4.1  --   --   --  3.8  --   --   --   CL 104 107  --   --   --  122*  --   --   --   CO2 24  --   --   --   --  21*  --   --   --   GLUCOSE 103* 102*  --   --   --  125*  --   --   --   BUN 19 20  --   --   --  13  --   --   --   CREATININE 1.22 1.20  --   --   --  1.30*  --   --   --   CALCIUM 8.7*  --   --   --   --  8.8*  --   --   --   MG  --   --   --  2.1  --   --   --   --   --    < > = values in this interval not displayed.    Lipid Panel:  Recent Labs  Lab 10/06/22 0617  CHOL 130  TRIG 64  HDL 38*  CHOLHDL 3.4  VLDL 13  LDLCALC 79    HgbA1c:  Recent Labs  Lab 10/06/22 0617  HGBA1C 5.4    Urine Drug Screen:  Recent Labs  Lab 10-13-22 1553  LABOPIA NONE DETECTED  COCAINSCRNUR NONE DETECTED  LABBENZ NONE DETECTED  AMPHETMU NONE DETECTED  THCU NONE DETECTED  LABBARB NONE  DETECTED     Alcohol Level  Recent Labs  Lab 2022/10/13 1402  ETH <10     IMAGING past 24 hours DG Abd Portable 1V  Result Date: 10/08/2022 CLINICAL DATA:  Nasal/orogastric tube placement. EXAM: PORTABLE ABDOMEN - 1 VIEW COMPARISON:  CT, 10/10/2017. FINDINGS: Nasal/orogastric tube passes below the diaphragm, tip in the proximal stomach, side hole just below  the expected location of the GE junction. Residual contrast noted in a decompressed left colon. IMPRESSION: 1. Well-positioned nasal/orogastric tube. Electronically Signed   By: Amie Portland M.D.   On: 10/08/2022 10:35   DG CHEST PORT 1 VIEW  Result Date: 10/08/2022 CLINICAL DATA:  Acute hypercapnic respiratory failure EXAM: PORTABLE CHEST 1 VIEW COMPARISON:  10/06/2022 FINDINGS: Right arm PICC tip again seen in the SVC above the right atrium. Right lung remains clear. Persistent atelectasis and or infiltrate in the left lower lobe appears similar. Left upper lung is clear. No new finding. IMPRESSION: Persistent atelectasis and or infiltrate in the left lower lobe. Electronically Signed   By: Paulina Fusi M.D.   On: 10/08/2022 10:16   MR BRAIN WO CONTRAST  Result Date: 10/07/2022 CLINICAL DATA:  Stroke, follow-up. EXAM: MRI HEAD WITHOUT CONTRAST TECHNIQUE: Multiplanar, multiecho pulse sequences of the brain and surrounding structures were obtained without intravenous contrast. COMPARISON:  Head CT Oct 06, 2022. FINDINGS: Brain: Large intraparenchymal hematoma in the right cerebral hemisphere measuring approximately 7.1 by 4.5 x 4.2 cm, similar to prior CT. Hematocrit level is noted within the hematoma. Cytotoxic edema/restricted diffusion is seen along the margins of the hematoma. There is also an area of restricted diffusion in the posterior right frontal lobe, anterior to the intraparenchymal hematoma, corresponding to area of hypodensity seen on prior CT, consistent with subacute infarct. Intraventricular and subarachnoid extension of  subarachnoid hemorrhage is also unchanged. Ventriculomegaly with mass effect on the right lateral ventricle are also unchanged. No evidence of entrapment. No significant midline shift. Confluent T2 hyperintensity the white matter of the cerebral hemispheres, nonspecific, most likely related to chronic microangiopathy. Small remote infarcts in the bilateral cerebellar hemispheres. Vascular: Normal flow voids. Skull and upper cervical spine: Normal marrow signal. Sinuses/Orbits: Bilateral exophthalmos, left greater than right. Paranasal sinuses are clear. Other: None. IMPRESSION: 1. Large intraparenchymal hematoma in the right cerebral hemisphere with intraventricular and subarachnoid extension of hemorrhage, similar to prior CT. 2. Subacute infarct in the posterior right frontal lobe, corresponding to area of hypodensity seen on prior CT. 3. Advanced chronic microvascular ischemic changes of the white matter. 4. Small remote infarcts in the bilateral cerebellar hemispheres. 5. Bilateral exophthalmos, left greater than right. Electronically Signed   By: Baldemar Lenis M.D.   On: 10/07/2022 12:33    PHYSICAL EXAM  Temp:  [98.6 F (37 C)-100.8 F (38.2 C)] 100.2 F (37.9 C) (05/04 0400) Pulse Rate:  [53-109] 106 (05/04 0900) Resp:  [13-24] 23 (05/04 0900) BP: (116-179)/(63-110) 116/88 (05/04 0900) SpO2:  [87 %-98 %] 98 % (05/04 0900)  General - Well nourished, well developed elderly Caucasian male,lethargic, no acute distress.  Mental Status -  Patient is drowsy but responds to his name but is not able to or answer orientation questions.  He is not following commands today  Cranial Nerves II - XII - II -pupils equal round and reactive III, IV, VI -eyelid droop on left eye with proptosis noted, extraocular movements intact vertically VII - Left lower facial droop, L proptosis VIII -hearing appears to be intact to voice XII -uncooperative with tongue protrusion  Motor Strength -able  to move right upper and lower extremities with good antigravity strength, no movement of left upper extremity, flicker of left lower extremity to noxious stimuli  Motor Tone - Muscle tone was assessed at the neck and appendages and was decreased in the left upper and lower extremities..  Reflexes - Deferred.  Sensory -intact to  noxious stimuli throughout  Coordination -unable to test  Gait and Station - deferred.   ASSESSMENT/PLAN [name] is a 82 y.o. male with history of hypertension, hyperlipidemia, thrombocytopenia, Graves' disease with exophthalmos, Mnire's disease, hard of hearing at baseline uses hearing aids, cervical spine fusion, latent tuberculosis s/p treatment, COPD  presenting with ischemic stroke and secondary R cortical ICH secondary to TNK.   Stroke: right MCA infarct status post TNK, complicated by large parenchymal hemorrhagic infarct with IVH, stroke embolic pattern, likely due to new diagnosed A-fib Code Stroke:CT head with subtle loss of gray-white differentiation in R frontal lobe. Chronic small vessel disease. Atrophy. Remote R parietal cortical infarct. ASPECTS 9.    CTA head & neck: No large vessel occlusion or proximal hemodynamically significant stenosis CT repeat for headache showed large parenchymal hematoma centered in the right frontal lobe, SAH seen in the right sylvian fissure.  Mass effect on right lateral ventricle CT repeat slight interval increase of right parenchymal hematoma and ICH, interval extension right > left IVH into lateral ventricles, enlargement of temporal and occipital horns, concerning for hydrocephalus.  Right posterior frontal infarct. CT repeat 5/2 stable parenchymal hematoma and IVH. MRI brain: Large intraparenchymal hematoma in the right cerebral hemisphere with intraventricular and subarachnoid extension of hemorrhage, similar to prior CT. Subacute infarct in the posterior right frontal lobe, corresponding to area of  hypodensity seen on prior CT. Small remote infarcts in the bilateral cerebellar hemispheres.   2D Echo: EF 60-65%  LDL 79 HgbA1c 5.4 VTE prophylaxis - SCD aspirin 81 mg daily prior to admission, now on No antithrombotic.  Therapy recommendations: CIR Disposition:  Pending  Cerebral edema CT showed large ICH with mass effect on the right ventricle but no significant midline shift On 3% saline @75  -> 50 ml/hr-> off, Na 155 S/p 3% bolus x 1 Na monitoring Na 140->144->146 -> 154 ->153-> 155  IVH, risk of hydrocephalus CT head concerning for developing hydrocephalus Repeat CT IVH stable MRI showed stable IVH NSGY consult if patient declines, and if the family indicates this would be within goals of care at the time   Afib, new diagnosis Telemetry and EKG captured A-fib Possible cause of stroke this time Hold off [facility] now due to hemorrhage May consider [facility] once hemorrhage resolves and neuro stable  Hypertension Home meds:  Telmisartan 20mg , Carvedilol 6.25mg  BID Stable on the high end Resume Coreg and avapro Add amlodipine Aggressive BP control, goal SBP < 160 Long-term BP goal normotensive  Hyperlipidemia Home meds:  Crestor 10mg  daily LDL 79, goal < 70 Statin on hold for now Consider to resume statin at discharge  Dysphagia Passed swallow Currently on dysphagia 1 and honey thick liquid Continue IV fluid Speech on board  Other Stroke Risk Factors Advanced Age >/= 98   Other Active Problems Graves Dz status post left eye surgery causing ptosis, methimazole 5mg  twice weekly Right hand resting tremor, concerning for Parkinson disease.  Patient has no neurology following PTA.   Hospital day # 3  Patient seen and examined by NP/APP with MD. MD to update note as needed.   Cortney E Ernestina Columbia , MSN, AGACNP-BC [facility] See Amion for schedule and pager information 10/08/2022 10:50 AM  I have personally obtained history,examined this patient, reviewed  notes, independently viewed imaging studies, participated in medical decision making and plan of care.ROS completed by me personally and pertinent positives fully documented  I have made any additions or clarifications directly to the  above note. Agree with note above.  This point patient appears to be in mild respiratory distress and may have aspirated overnight [place] has been made n.p.o. NG tube has been inserted.  Recommend close monitoring for respiratory distress.  Patient is DNR.  Discussed with Dr. Merrily Pew critical care medicine.  May need higher oxygen noninvasive treatment.  Antibiotics for presumed aspiration.  Keep NPO. No family available at the bedside for discussion.This patient is critically ill and at significant risk of neurological worsening, death and care requires constant monitoring of vital signs, hemodynamics,respiratory and cardiac monitoring, extensive review of multiple databases, frequent neurological assessment, discussion with family, other specialists and medical decision making of high complexity.I have made any additions or clarifications directly to the above note.This critical care time does not reflect procedure time, or teaching time or supervisory time of PA/NP/Med Resident etc but could involve care discussion time.  I spent 30 minutes of neurocritical care time  in the care of  this patient.     Delia Heady, MD Medical Director [facility] Pager: [phone] 10/08/2022 1:20 PM   To contact Stroke Continuity provider, please refer to [email or web address]. After hours, contact General Neurology

## 2022-10-08 NOTE — Progress Notes (Signed)
Variable sodium levels. May in part be due to labs being drawn from PICC line through which 3% has also been infusing.   However with 3% off from 8 PM to 2 AM, sodium has increased from 153 to 155 (likely within lab margin of error)  Will continue to hold 3% at this time and monitor

## 2022-10-08 NOTE — Procedures (Signed)
Bronchoscopy Procedure Note  CAIDAN HOGANCAMP  [id number]  07-01-1940  Date:10/08/22  Time:4:10 PM   Provider Performing:Ashyia Schraeder   Procedure(s):  Flexible bronchoscopy with bronchial alveolar lavage (65784)  Indication(s) Aspiration pneumonia  Consent Risks of the procedure as well as the alternatives and risks of each were explained to the patient and/or caregiver.  Consent for the procedure was obtained and is signed in the bedside chart  Anesthesia Etomidate and rocuronium   Time Out Verified patient identification, verified procedure, site/side was marked, verified correct patient position, special equipment/implants available, medications/allergies/relevant history reviewed, required imaging and test results available.   Sterile Technique Usual hand hygiene, masks, gowns, and gloves were used   Procedure Description Bronchoscope advanced through endotracheal tube and into airway.  Airways were examined down to subsegmental level with findings noted below.   Following diagnostic evaluation, BAL(s) performed in right lower lobe with normal saline and return of clear fluid  Findings: Scant secretions noted throughout the respiratory tree, chronically ill looking lumpy bumpy mucosa   Complications/Tolerance None; patient tolerated the procedure well. Chest X-ray is needed post procedure.   EBL Minimal   Specimen(s) BAL

## 2022-10-08 NOTE — Progress Notes (Signed)
Pt less responsive and less interactive w/ increased work of breathing, stat CT ordered by Dr. Pearlean Brownie, ABG ordered by Dr. Merrily Pew.

## 2022-10-08 NOTE — Procedures (Signed)
Intubation Procedure Note  Mario Smith  [id number]  [birth year]  Date:10/08/22  Time:4:10 PM   Provider Performing:Nicki Furlan    Procedure: Intubation (31500)  Indication(s) Respiratory Failure  Consent Risks of the procedure as well as the alternatives and risks of each were explained to the patient and/or caregiver.  Consent for the procedure was obtained and is signed in the bedside chart   Anesthesia Etomidate and Rocuronium   Time Out Verified patient identification, verified procedure, site/side was marked, verified correct patient position, special equipment/implants available, medications/allergies/relevant history reviewed, required imaging and test results available.   Sterile Technique Usual hand hygeine, masks, and gloves were used   Procedure Description Patient positioned in bed supine.  Sedation given as noted above.  Patient was intubated with endotracheal tube using  DL .  View was Grade 3 only epiglottis .  Number of attempts was 1.  Colorimetric CO2 detector was consistent with tracheal placement.   Complications/Tolerance None; patient tolerated the procedure well. Chest X-ray is ordered to verify placement.   EBL Minimal   Specimen(s) None

## 2022-10-09 DIAGNOSIS — I639 Cerebral infarction, unspecified: Secondary | ICD-10-CM | POA: Diagnosis not present

## 2022-10-09 DIAGNOSIS — I619 Nontraumatic intracerebral hemorrhage, unspecified: Secondary | ICD-10-CM | POA: Diagnosis not present

## 2022-10-09 DIAGNOSIS — R509 Fever, unspecified: Secondary | ICD-10-CM | POA: Diagnosis not present

## 2022-10-09 DIAGNOSIS — J9601 Acute respiratory failure with hypoxia: Secondary | ICD-10-CM | POA: Diagnosis not present

## 2022-10-09 DIAGNOSIS — E870 Hyperosmolality and hypernatremia: Secondary | ICD-10-CM | POA: Diagnosis not present

## 2022-10-09 LAB — GLUCOSE, CAPILLARY
Glucose-Capillary: 120 mg/dL — ABNORMAL HIGH (ref 70–99)
Glucose-Capillary: 144 mg/dL — ABNORMAL HIGH (ref 70–99)
Glucose-Capillary: 98 mg/dL (ref 70–99)
Glucose-Capillary: 142 mg/dL — ABNORMAL HIGH (ref 70–99)
Glucose-Capillary: 109 mg/dL — ABNORMAL HIGH (ref 70–99)

## 2022-10-09 LAB — BASIC METABOLIC PANEL
Anion gap: 8 (ref 5–15)
Anion gap: 7 (ref 5–15)
BUN: 30 mg/dL — ABNORMAL HIGH (ref 8–23)
BUN: 27 mg/dL — ABNORMAL HIGH (ref 8–23)
CO2: 20 mmol/L — ABNORMAL LOW (ref 22–32)
CO2: 22 mmol/L (ref 22–32)
Calcium: 8.6 mg/dL — ABNORMAL LOW (ref 8.9–10.3)
Calcium: 8.5 mg/dL — ABNORMAL LOW (ref 8.9–10.3)
Chloride: 125 mmol/L — ABNORMAL HIGH (ref 98–111)
Chloride: 125 mmol/L — ABNORMAL HIGH (ref 98–111)
Creatinine, Ser: 1.34 mg/dL — ABNORMAL HIGH (ref 0.61–1.24)
Creatinine, Ser: 1.25 mg/dL — ABNORMAL HIGH (ref 0.61–1.24)
GFR, Estimated: 53 mL/min — ABNORMAL LOW (ref >60)
GFR, Estimated: 57 mL/min — ABNORMAL LOW (ref >60)
Glucose, Bld: 142 mg/dL — ABNORMAL HIGH (ref 70–99)
Glucose, Bld: 212 mg/dL — ABNORMAL HIGH (ref 70–99)
Potassium: 4.4 mmol/L (ref 3.5–5.1)
Potassium: 3.2 mmol/L — ABNORMAL LOW (ref 3.5–5.1)
Sodium: 153 mmol/L — ABNORMAL HIGH (ref 135–145)
Sodium: 154 mmol/L — ABNORMAL HIGH (ref 135–145)

## 2022-10-09 LAB — CBC
HCT: 35.8 % — ABNORMAL LOW (ref 39.0–52.0)
HCT: 41.6 % (ref 39.0–52.0)
Hemoglobin: 11.8 g/dL — ABNORMAL LOW (ref 13.0–17.0)
Hemoglobin: 13.2 g/dL (ref 13.0–17.0)
MCH: 31.4 pg (ref 26.0–34.0)
MCH: 30.6 pg (ref 26.0–34.0)
MCHC: 33 g/dL (ref 30.0–36.0)
MCHC: 31.7 g/dL (ref 30.0–36.0)
MCV: 95.2 fL (ref 80.0–100.0)
MCV: 96.3 fL (ref 80.0–100.0)
Platelets: 83 10*3/uL — ABNORMAL LOW (ref 150–400)
Platelets: 78 10*3/uL — ABNORMAL LOW (ref 150–400)
RBC: 3.76 MIL/uL — ABNORMAL LOW (ref 4.22–5.81)
RBC: 4.32 MIL/uL (ref 4.22–5.81)
RDW: 14.5 % (ref 11.5–15.5)
RDW: 14.6 % (ref 11.5–15.5)
WBC: 8.9 10*3/uL (ref 4.0–10.5)
WBC: 8.6 10*3/uL (ref 4.0–10.5)
nRBC: 0 % (ref 0.0–0.2)
nRBC: 0 % (ref 0.0–0.2)

## 2022-10-09 LAB — CULTURE, BLOOD (ROUTINE X 2)
Culture: NO GROWTH
Special Requests: ADEQUATE

## 2022-10-09 LAB — CULTURE, RESPIRATORY W GRAM STAIN

## 2022-10-09 LAB — SODIUM
Sodium: 156 mmol/L — ABNORMAL HIGH (ref 135–145)
Sodium: 158 mmol/L — ABNORMAL HIGH (ref 135–145)

## 2022-10-09 MED ORDER — POTASSIUM CHLORIDE 10 MEQ/100ML IV SOLN
10.0000 meq | INTRAVENOUS | Status: AC
  Administered 2022-10-09 (×4): 10 meq via INTRAVENOUS
  Filled 2022-10-09 (×4): qty 100

## 2022-10-09 MED ORDER — CHLORHEXIDINE GLUCONATE CLOTH 2 % EX PADS
6.0000 | MEDICATED_PAD | Freq: Once | CUTANEOUS | Status: AC
  Administered 2022-10-09: 6 via TOPICAL

## 2022-10-09 MED ORDER — LACTULOSE 10 GM/15ML PO SOLN
20.0000 g | ORAL | Status: DC
  Administered 2022-10-09 (×2): 20 g
  Filled 2022-10-09 (×2): qty 30

## 2022-10-09 MED ORDER — POTASSIUM CHLORIDE 20 MEQ PO PACK
40.0000 meq | PACK | Freq: Once | ORAL | Status: AC
  Administered 2022-10-09: 40 meq
  Filled 2022-10-09: qty 2

## 2022-10-09 MED ORDER — CHLORHEXIDINE GLUCONATE CLOTH 2 % EX PADS
6.0000 | MEDICATED_PAD | Freq: Every day | CUTANEOUS | Status: DC
  Administered 2022-10-09: 6 via TOPICAL

## 2022-10-09 NOTE — Progress Notes (Signed)
   10/09/22 0940  Spiritual Encounters  Type of Visit Follow up  Care provided to: Pt and family  Reason for visit Routine spiritual support  OnCall Visit Yes  Spiritual Framework  Presenting Themes Goals in life/care;Values and beliefs;Significant life change;Rituals and practive  Values/beliefs God and prayer  Community/Connection Family  Strengths Caring  Needs/Challenges/Barriers Overwhelmed by amount of life-critical decisions  Family Stress Factors Loss of control;Lack of knowledge  Interventions  Spiritual Care Interventions Made Established relationship of care and support;Compassionate presence;Reflective listening;Normalization of emotions;Explored ethical dilemma;Decision-making support/facilitation;Prayer  Intervention Outcomes  Outcomes Connection to spiritual care  Spiritual Care Plan  Spiritual Care Issues Still Outstanding Chaplain will continue to follow   Visit prompted by consult. Family was receptive to chaplain's visit. Spouse Mario Smith) indicated that this is the third chaplain visit rec'd, for which she is grateful.   Chaplain provided reflective listening. Spouse overwhelmed by having to make big decisions. She notes that patient would not want trach. Spouse experience grief at the thought of "losing him." Chaplain explored trach option with spouse.  Chaplain provided prayer. Chaplain reaffirmed spiritual care presence.

## 2022-10-09 NOTE — Progress Notes (Signed)
Pt's wife noted to be praying and demonstrating her faith during the night around 2000. I spoke to her and acknowledged to her that I believe in pray as well. After this encounter, she was receptive to having the chaplain come pray with the family at any time. Consult to spiritual care placed.

## 2022-10-09 NOTE — Progress Notes (Addendum)
STROKE TEAM PROGRESS NOTE   INTERVAL HISTORY Patient is seen in his room with his wife at the bedside.  Patient with sedation on hold he is awake and interactive and follows commands consistently on the right but remains with right gaze deviation and left hemiplegia.  Long discussion with patient's wife at bedside this morning.  He has been hemodynamically stable.  His sodium is 154 this morning, still on 3% HTS at 25 cc/h.  Will continue to follow.  Due to ongoing aspiration, hypoxia, and concern for airway protection, patient was intubated yesterday.   24 hour TMAX of 100.5 pains on IV Unasyn for aspiration pneumonia CT head 5/4 no evidence of new or additional bleeding.  IPH in the right temporal/parietal region as before, with slight clot evolution as expected.  The amount of IVH and SAH blood as not increased.  Ventricular size is not increased.  Vitals:   10/09/22 0600 10/09/22 0602 10/09/22 0615 10/09/22 0700  BP: (!) 85/60 (!) 88/55 107/67 104/74  Pulse: 76 77 76 76  Resp: 18 18 20 17   Temp:      TempSrc:      SpO2: 98% 96% 99% 99%  Weight:       CBC:  Recent Labs  Lab 10/27/2022 1402 10/31/2022 1409 10/07/22 0545 10/08/22 1540 10/09/22 0125 10/09/22 0626  WBC 6.6   < > 8.7  --  8.9 8.6  NEUTROABS 4.5  --  6.6  --   --   --   HGB 15.1   < > 12.3*   < > 11.8* 13.2  HCT 45.3   < > 38.0*   < > 35.8* 41.6  MCV 93.2   < > 96.4  --  95.2 96.3  PLT 131*   < > 102*  --  83* 78*   < > = values in this interval not displayed.    Basic Metabolic Panel:  Recent Labs  Lab 10/06/22 0617 10/06/22 1243 10/09/22 0027 10/09/22 0626  NA 144   < > 153* 154*  K  --    < > 4.4 3.2*  CL  --    < > 125* 125*  CO2  --    < > 20* 22  GLUCOSE  --    < > 142* 212*  BUN  --    < > 30* 27*  CREATININE  --    < > 1.34* 1.25*  CALCIUM  --    < > 8.6* 8.5*  MG 2.1  --   --   --    < > = values in this interval not displayed.    Lipid Panel:  Recent Labs  Lab 10/06/22 0617  CHOL 130   TRIG 64  HDL 38*  CHOLHDL 3.4  VLDL 13  LDLCALC 79    HgbA1c:  Recent Labs  Lab 10/06/22 0617  HGBA1C 5.4    Urine Drug Screen:  Recent Labs  Lab 10/24/2022 1553  LABOPIA NONE DETECTED  COCAINSCRNUR NONE DETECTED  LABBENZ NONE DETECTED  AMPHETMU NONE DETECTED  THCU NONE DETECTED  LABBARB NONE DETECTED     Alcohol Level  Recent Labs  Lab 10/23/2022 1402  ETH <10    IMAGING past 24 hours DG CHEST PORT 1 VIEW  Result Date: 10/08/2022 CLINICAL DATA:  Intubation.  Code stroke EXAM: PORTABLE CHEST 1 VIEW COMPARISON:  Chest radiograph performed earlier on the same date. FINDINGS: Cardiomediastinal silhouette is unchanged. Interval placement of endotracheal tube with distal tip  approximately 4 cm above the carina. NG tube with distal tip and side port projecting over the proximal stomach. Right IJ access MediPort with distal tip in the SVC. Persistent elevation of the left hemidiaphragm with left basilar atelectasis or infiltrate. Ingested contrast in the descending colon is also unchanged. Anterior cervical discectomy and fusion hardware. Thoracic spondylosis. No acute osseous abnormality. IMPRESSION: Interval placement of endotracheal tube with distal tip approximately 4 cm above the carina. NG tube with distal tip and side port projecting over the proximal stomach. Electronically Signed   By: Larose Hires D.O.   On: 10/08/2022 14:34   CT HEAD WO CONTRAST ( )  Result Date: 10/08/2022 CLINICAL DATA:  Follow-up stroke with intracranial hemorrhage EXAM: CT HEAD WITHOUT CONTRAST TECHNIQUE: Contiguous axial images were obtained from the base of the skull through the vertex without intravenous contrast. RADIATION DOSE REDUCTION: This exam was performed according to the departmental dose-optimization program which includes automated exposure control, adjustment of the mA and/or kV according to patient size and/or use of iterative reconstruction technique. COMPARISON:  CT 10/06/2022.  MRI  10/07/2022. FINDINGS: Brain: No evidence of new or additional bleeding. No focal abnormality seen affecting the brainstem or cerebellum. Intraparenchymal hemorrhage in the right temporal/parietal region as before, with slight clot evolution as expected. The amount of intraventricular blood does not appear increased, layering dependently. Small amount of subarachnoid blood is not increased. Ventricular size is not increased. Chronic small-vessel ischemic changes elsewhere throughout the brain as seen previously. Vascular: There is atherosclerotic calcification of the major vessels at the base of the brain. Skull: Negative Sinuses/Orbits: Clear/normal Other: None IMPRESSION: 1. No evidence of new or additional bleeding. 2. Intraparenchymal hemorrhage in the right temporal/parietal region as before, with slight clot evolution as expected. The amount of intraventricular and subarachnoid blood is not increased. Ventricular size is not increased. 3. Chronic small-vessel ischemic changes elsewhere throughout the brain as seen previously. Electronically Signed   By: Paulina Fusi M.D.   On: 10/08/2022 13:44   DG Abd Portable 1V  Result Date: 10/08/2022 CLINICAL DATA:  Nasal/orogastric tube placement. EXAM: PORTABLE ABDOMEN - 1 VIEW COMPARISON:  CT, 10/10/2017. FINDINGS: Nasal/orogastric tube passes below the diaphragm, tip in the proximal stomach, side hole just below the expected location of the GE junction. Residual contrast noted in a decompressed left colon. IMPRESSION: 1. Well-positioned nasal/orogastric tube. Electronically Signed   By: Amie Portland M.D.   On: 10/08/2022 10:35   DG CHEST PORT 1 VIEW  Result Date: 10/08/2022 CLINICAL DATA:  Acute hypercapnic respiratory failure EXAM: PORTABLE CHEST 1 VIEW COMPARISON:  10/06/2022 FINDINGS: Right arm PICC tip again seen in the SVC above the right atrium. Right lung remains clear. Persistent atelectasis and or infiltrate in the left lower lobe appears similar. Left  upper lung is clear. No new finding. IMPRESSION: Persistent atelectasis and or infiltrate in the left lower lobe. Electronically Signed   By: Paulina Fusi M.D.   On: 10/08/2022 10:16    PHYSICAL EXAM  Temp:  [97.6 F (36.4 C)-100.5 F (38.1 C)] 97.6 F (36.4 C) (05/05 0359) Pulse Rate:  [59-106] 76 (05/05 0700) Resp:  [15-23] 17 (05/05 0700) BP: (79-165)/(48-95) 104/74 (05/05 0700) SpO2:  [92 %-100 %] 99 % (05/05 0700) FiO2 (%):  [40 %-100 %] 40 % (05/05 0428) Weight:  [76 kg] 76 kg (05/05 0400)  General - Well nourished, well developed elderly Caucasian male, critically ill-appearing, laying in ICU bed Respiratory-spontaneous respirations present, respirations assisted via ETT.  Patient  on PS/CPAP.  Intermittent tachypnea. Cardiac- atrial fibrillaiton with rate from 80's - 150's with frequent PVCs on telemetry  Mental Status -  Patient is intubated with his eyes open on exam, responds to his name and to voice. Patient intermittently follows commands today.  Unable to participate in orientation questions due to intubation with mechanical ventilation.  Cranial Nerves II - XII - II - pupils equal round and reactive to light III, IV, VI - eyelid droop on left eye with proptosis noted, extraocular movements intact vertically.  Right gaze preference, will not cross midline towards the left. VII -facial symmetry assessment obscured via ETT and securement device VIII -hearing appears to be intact to voice XII -uncooperative with tongue protrusion  Motor Strength -able to move right upper and lower extremities with good antigravity strength, no movement of left upper extremity, flicker of left lower extremity to noxious stimuli, right upper extremity coarse tremor throughout assessment  Motor Tone - Muscle tone was assessed at the neck and appendages and was decreased in the left upper and lower extremities.  Reflexes - Deferred.  Sensory -intact to noxious stimuli  throughout  Coordination -unable to test  Gait and Station - deferred.   ASSESSMENT/PLAN Mr. BRONSEN TAJIMA is a 82 y.o. male with history of hypertension, hyperlipidemia, thrombocytopenia, Graves' disease with exophthalmos, Mnire's disease, hard of hearing at baseline uses hearing aids, cervical spine fusion, latent tuberculosis s/p treatment, COPD  presenting with ischemic stroke and secondary R cortical ICH secondary to TNK.   Stroke: right MCA infarct status post TNK, complicated by large parenchymal hemorrhagic infarct with IVH, stroke embolic pattern, likely due to new diagnosed A-fib Code Stroke:CT head with subtle loss of gray-white differentiation in R frontal lobe. Chronic small vessel disease. Atrophy. Remote R parietal cortical infarct. ASPECTS 9.    CTA head & neck: No large vessel occlusion or proximal hemodynamically significant stenosis CT repeat for headache showed large parenchymal hematoma centered in the right frontal lobe, SAH seen in the right sylvian fissure.  Mass effect on right lateral ventricle CT repeat slight interval increase of right parenchymal hematoma and ICH, interval extension right > left IVH into lateral ventricles, enlargement of temporal and occipital horns, concerning for hydrocephalus.  Right posterior frontal infarct. CT repeat 5/2 stable parenchymal hematoma and IVH. CTH repeat 5/4: No evidence of new or additional bleeding.  IPH in the right temporal/parietal region as before, with slight clot evolution as expected.  Amount of IVH and SAH blood is not increased.  Ventricular size is not increased. MRI brain: Large intraparenchymal hematoma in the right cerebral hemisphere with intraventricular and subarachnoid extension of hemorrhage, similar to prior CT. Subacute infarct in the posterior right frontal lobe, corresponding to area of hypodensity seen on prior CT. Small remote infarcts in the bilateral cerebellar hemispheres.   2D Echo: EF 60-65%  LDL  79 HgbA1c 5.4 VTE prophylaxis - SCD  aspirin 81 mg daily prior to admission, now on No antithrombotic.  Therapy recommendations: CIR Disposition:  Pending  Cerebral edema CT showed large ICH with mass effect on the right ventricle but no significant midline shift On 3% saline @75  -> 50 ml/hr-> 25 mL/h, Na 155 -> 154 S/p 3% bolus x 1 on 5/2 Na monitoring q6h Na 140->144->146 -> 154 ->153-> 155 -> 154  IVH, risk of hydrocephalus CT head concerning for developing hydrocephalus Repeat CT IVH stable MRI showed stable IVH NSGY consult if patient declines, and if the family indicates this would be  within goals of care at the time   Afib, new diagnosis Telemetry and EKG captured A-fib Possible cause of stroke this time Hold off [facility] now due to hemorrhage May consider [facility] once hemorrhage resolves and neuro stable  Hypertension Home meds:  Telmisartan 20mg , Carvedilol 6.25mg  BID Stable with some hypotension overnight  Resume Coreg, avapro, amlodipine Continue to monitor for symptomatic hypotension Aggressive BP control, goal SBP < 160 Long-term BP goal normotensive  Hyperlipidemia Home meds:  Crestor 10mg  daily LDL 79, goal < 70 Statin on hold for now Consider to resume statin at discharge  Dysphagia with concern for ongoing aspiration with PO intake Passed swallow initially and was recommended dysphagia 1 and honey thick liquid NPO decision 5/4 with NGT placement for feeds and meds Continue IV Unasyn for aspiration pneumonia  Hypoxic respiratory failure requiring intubation  Likely secondary to aspiration pneumonia  Continue IV antibiotics Vent management per PCCM NPO NGT for medications  SLP prior to PO intake   Other Stroke Risk Factors Advanced Age >/= 39   Other Active Problems Graves Dz status post left eye surgery causing ptosis, methimazole 5mg  twice weekly Right hand resting tremor, concerning for Parkinson disease.  Patient has no neurology following PTA.    Hospital day # 4  Patient seen and examined by NP/APP with MD. MD to update note as needed.   Kara Mead , MSN, AGACNP-BC [facility] See Amion for schedule and pager information 10/09/2022 8:06 AM  I have personally obtained history,examined this patient, reviewed notes, independently viewed imaging studies, participated in medical decision making and plan of care.ROS completed by me personally and pertinent positives fully documented  I have made any additions or clarifications directly to the above note. Agree with note above.  Patient neurological exam shows improvement and with sedation held following some commands.  Remains with significant left hemiplegia.  Aspiration pneumonia seems to be responding to antibiotics and is weaning off ventilatory support.  I had a long discussion at bedside with the patient's wife regarding goals of care.  Patient could potentially be extubated over the next few days but the wife needs to be clear  with her plan as to what to do if he aspirates again after extubation and needs reintubation..  The patient will need a definite plan of either early tracheostomy or comfort care in that situation..  She needs some time to think about this.  Discussed with Dr. Merrily Pew critical care medicine.  Extubate as per critical care team This patient is critically ill and at significant risk of neurological worsening, death and care requires constant monitoring of vital signs, hemodynamics,respiratory and cardiac monitoring, extensive review of multiple databases, frequent neurological assessment, discussion with family, other specialists and medical decision making of high complexity.I have made any additions or clarifications directly to the above note.This critical care time does not reflect procedure time, or teaching time or supervisory time of PA/NP/Med Resident etc but could involve care discussion time.  I spent 30 minutes of neurocritical care time  in the  care of  this patient.     Delia Heady, MD Medical Director [facility] Pager: [phone] 10/09/2022 1:03 PM  To contact Stroke Continuity provider, please refer to [email or web address]. After hours, contact General Neurology

## 2022-10-09 NOTE — Progress Notes (Signed)
Mario Smith, MRN:  [id number], DOB:  22-Jul-1940, LOS: 4 ADMISSION DATE:  2022-10-19, CONSULTATION DATE: 10/07/2022 REFERRING MD: Dr. Roda Shutters, CHIEF COMPLAINT: Fever  History of Present Illness:  82 year old male with hypertension, hyperlipidemia and tuberculosis status posttreatment who was admitted with acute right MCA stroke, received TNK, complicated with hemorrhagic conversion leading to large intraparenchymal hemorrhage on right side.  Patient was started on 3% hypertonic saline.  He started spiking fever with Tmax 102, PCCM was consulted for help evaluation medical management  Patient denies chest pain, shortness of breath, nausea, vomiting, cough and dysuria  Pertinent  Medical History   Past Medical History:  Diagnosis Date   Abdominal pain 10/10/2017   Arthritis    Back pain    Colon polyp 2011   tubular adenoma.  no HG dysplasia.     Constipation    COPD (chronic obstructive pulmonary disease) (HCC)    no per pt   Dehydration 10/10/2017   Dyspnea    with exertion   ED (erectile dysfunction)    Essential hypertension, benign    Gallbladder sludge 10/2017   GERD (gastroesophageal reflux disease)    acute gastritis   Graves disease    History of kidney stones    HTN (hypertension)    Other and unspecified hyperlipidemia    PONV (postoperative nausea and vomiting)    Pt's wife also said that when he had a nerve block in 2015 here, pt had a reaction immediately. 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- finished medication   Vertigo      Significant Hospital Events: Including procedures, antibiotic start and stop dates in addition to other pertinent events     Interim History / Subjective:  He was struggling to breathe yesterday, had to be intubated and placed on mechanical ventilation Spiked fever again with Tmax 100.5  No overnight  issues  Objective   Blood pressure 104/74, pulse 76, temperature 97.6 F (36.4 C), temperature source Oral, resp. rate 17, weight 76 kg, SpO2 99 %.    Vent Mode: PRVC FiO2 (%):  [40 %-100 %] 40 % Set Rate:  [16 bmp] 16 bmp Vt Set:  [520 mL] 520 mL PEEP:  [5 cmH20] 5 cmH20 Plateau Pressure:  [12 cmH20-13 cmH20] 13 cmH20   Intake/Output Summary (Last 24 hours) at 10/09/2022 0745 Last data filed at 10/09/2022 0700 Gross per 24 hour  Intake 1811.93 ml  Output 975 ml  Net 836.93 ml   Filed Weights   10-19-22 1418 10/09/22 0400  Weight: 78.8 kg 76 kg    Examination: Physical exam: General: Crtitically ill-appearing male, orally intubated HEENT: [place]/AT, proptosis of eyes, anicteric.  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 Complicated with hemorrhagic conversion leading to acute large left parietal intraparenchymal hemorrhage with IVH Continue neuro watch every hour Stroke team is following Continue secondary stroke prophylaxis Holding antiplatelet and anticoagulation Yesterday patient's  mental status deteriorated, he was not able to protect his airway he had to be intubated as he was constantly aspirating He is awake, following commands  Cerebral edema with brain compression Induced hypernatremia/hyperchloremia Hypokalemia Patient serum sodium is 154 Still on 3% hypertonic saline 25 cc an hour Monitor serum sodium every 6 hours Keep head end of the bed elevated >30 Maintain euthymia Seizure precautions Continue with  electrolyte supplement and monitor  Acute hypoxic respiratory failure Aspiration pneumonia Patient was not able to protect his airway, he was aspirating, started getting hypoxic Had to be intubated yesterday He is still spiking fever but Tmax is 100.5 Continue IV Unasyn Continue lung protective ventilation VAP prevention bundle in place PAD protocol with Precedex and as needed fentanyl, RASS goal -1  Paroxysmal A-fib with controlled rate Patient heart rate is controlled ranging between 90-100, he remains in A-fib, likely that is the reason for acute stroke Continue Coreg Holding anticoagulation in the setting of intraparenchymal hemorrhage Continue telemetry monitoring  Hypertension Patient blood pressure is better controlled today Continue amlodipine, Coreg and irbesartan  CKD stage IIIa Serum creatinine is at baseline, monitor intake and output Monitor serum creatinine Avoid nephrotoxic agents  Anemia and thrombocytopenia of critical illness Monitor H&H and platelet count Platelet counts are slowly dropping, currently at 78 Watch for signs of bleeding  Hyperthyroidism Patient's TSH is 1.2 with normal free T3 and T4 Continue methimazole  Best Practice (right click and "Reselect all SmartList Selections" daily)   Diet/type: Tube feeds DVT prophylaxis: SCD GI prophylaxis: PPI Lines: Central line Foley:  Yes, and it is still needed Code Status:  DNR, but short-term intubation Last date of multidisciplinary goals of care discussion [5/4: Patient's family was updated at bedside  Labs   CBC: Recent Labs  Lab Oct 31, 2022 1402 2022-10-31 1409 2022/10/31 1817 10/07/22 0545 10/08/22 1540 10/09/22 0125 10/09/22 0626  WBC 6.6  --  6.7 8.7  --  8.9 8.6  NEUTROABS 4.5  --   --  6.6  --   --   --   HGB 15.1   < > 14.3 12.3* 10.2* 11.8* 13.2  HCT 45.3   < > 43.6 38.0* 30.0* 35.8* 41.6  MCV 93.2  --  94.2 96.4  --  95.2 96.3  PLT 131*  --  126* 102*  --  83* 78*   < > = values  in this interval not displayed.    Basic Metabolic Panel: Recent Labs  Lab 10-31-22 1402 10/31/2022 1409 2022-10-31 1817 10/06/22 0617 10/06/22 1243 10/07/22 0545 10/07/22 1240 10/08/22 1228 10/08/22 1540 10/08/22 1747 10/09/22 0027 10/09/22 0626  NA 135 140   < > 144   < > 154*   < > 154* 155* 152* 153* 154*  K 3.9 4.1  --   --   --  3.8  --   --  3.3*  --  4.4 3.2*  CL 104 107  --   --   --  122*  --   --   --   --  125* 125*  CO2 24  --   --   --   --  21*  --   --   --   --  20* 22  GLUCOSE 103* 102*  --   --   --  125*  --   --   --   --  142* 212*  BUN 19 20  --   --   --  13  --   --   --   --  30* 27*  CREATININE 1.22 1.20  --   --   --  1.30*  --   --   --   --  1.34* 1.25*  CALCIUM 8.7*  --   --   --   --  8.8*  --   --   --   --  8.6* 8.5*  MG  --   --   --  2.1  --   --   --   --   --   --   --   --    < > = values in this interval not displayed.   GFR: Estimated Creatinine Clearance: 42.6 mL/min (A) (by C-G formula based on SCr of 1.25 mg/dL (H)). Recent Labs  Lab 11-02-22 1817 10/07/22 0545 10/09/22 0125 10/09/22 0626  PROCALCITON  --  <0.10  --   --   WBC 6.7 8.7 8.9 8.6  LATICACIDVEN  --  1.1  --   --     Liver Function Tests: Recent Labs  Lab 11/02/22 1402 10/07/22 0545  AST 20 17  ALT 16 14  ALKPHOS 63 38  BILITOT 0.9 0.9  PROT 6.9 6.0*  ALBUMIN 3.9 3.2*   Recent Labs  Lab 10/07/22 0545  LIPASE 38   No results for input(s): "AMMONIA" in the last 168 hours.  ABG    Component Value Date/Time   PHART 7.398 10/08/2022 1540   PCO2ART 41.1 10/08/2022 1540   PO2ART 399 (H) 10/08/2022 1540   HCO3 25.4 10/08/2022 1540   TCO2 27 10/08/2022 1540   O2SAT 100 10/08/2022 1540     Coagulation Profile: Recent Labs  Lab 11/02/22 1402 Nov 02, 2022 1817  INR 0.9 1.0    Cardiac Enzymes: No results for input(s): "CKTOTAL", "CKMB", "CKMBINDEX", "TROPONINI" in the last 168 hours.  HbA1C: Hgb A1c MFr Bld  Date/Time Value Ref Range Status   10/06/2022 06:17 AM 5.4 4.8 - 5.6 % Final    Comment:    (NOTE) Pre diabetes:          5.7%-6.4%  Diabetes:              >6.4%  Glycemic control for   <7.0% adults with diabetes     CBG: Recent Labs  Lab 10/08/22 1230 10/08/22 1600 10/08/22 1943 10/08/22 2322 10/09/22 0306  GLUCAP 106* 110* 145* 126* 120*    Review of Systems:   12 point review of system is significant for complaint mentioned HPI, rest negative  Past Medical History:  He,  has a past medical history of Abdominal pain (10/10/2017), Arthritis, Back pain, Colon polyp (2011), Constipation, COPD (chronic obstructive pulmonary disease) (HCC), Dehydration (10/10/2017), Dyspnea, ED (erectile dysfunction), Essential hypertension, benign, Gallbladder sludge (10/2017), GERD (gastroesophageal reflux disease), Graves disease, History of kidney stones, HTN (hypertension), Other and unspecified hyperlipidemia, PONV (postoperative nausea and vomiting), Thrombocytopenia (HCC), Tuberculosis, and Vertigo.   Surgical History:   Past Surgical History:  Procedure Laterality Date   CATARACT EXTRACTION Bilateral    CHOLECYSTECTOMY N/A 01/18/2018   Procedure: LAPAROSCOPIC CHOLECYSTECTOMY WITH INTRAOPERATIVE CHOLANGIOGRAM ERAS PATHWAY;  Surgeon: Griselda Miner, MD;  Location: [facility] OR;  Service: General;  Laterality: N/A;   COLONOSCOPY  03/2010    Dr Walker Kehr, [facility]. Higher risk screening study, sister's hx of colon CA.     ESOPHAGOGASTRODUODENOSCOPY (EGD) WITH PROPOFOL N/A 10/11/2017   Procedure: ESOPHAGOGASTRODUODENOSCOPY (EGD) WITH PROPOFOL;  Surgeon: Napoleon Form, MD;  Location: [facility];  Service: Endoscopy;  Laterality: N/A;  EYE SURGERY     lid-lift   SPINAL FUSION  2001   cervical   TENDON REPAIR Right 05/16/2014   Procedure: RIGHT RING FINGER FLEXOR TENDON REPAIR WITH PULL OUT BUTTON;  Surgeon: Dominica Severin, MD;  Location: [facility];  Service: Orthopedics;  Laterality: Right;   UPPER  GASTROINTESTINAL ENDOSCOPY       Social History:   reports that he quit smoking about 29 years ago. His smoking use included cigarettes. He quit smokeless tobacco use about 29 years ago.  His smokeless tobacco use included chew. He reports that he does not currently use alcohol. He reports that he does not use drugs.   Family History:  His family history includes Alcohol abuse in his father; COPD in his father and mother; Cancer in his brother and sister; Colon cancer in his sister; Emphysema in his sister and sister; Healthy in his daughter; Heart disease in his father and mother; Prostate cancer in his brother. There is no history of Esophageal cancer, Stomach cancer, or Rectal cancer.   Allergies Allergies  Allergen Reactions   Simvastatin Other (See Comments)    Leg cramps   Sulfa Antibiotics     UNSPECIFIED REACTION    Codeine Nausea And Vomiting   Doxycycline Nausea And Vomiting   Hydrocodone Nausea And Vomiting   Meclizine Nausea Only   Niacin And Related Other (See Comments)    Flushing in the face and redness    Ondansetron Other (See Comments)    Tremors    Phenergan [Promethazine Hcl] Other (See Comments)    Tremors      Home Medications  Prior to Admission medications   Medication Sig Start Date End Date Taking? Authorizing Provider  albuterol (VENTOLIN HFA) 108 (90 Base) MCG/ACT inhaler Inhale 2 puffs into the lungs every 6 (six) hours as needed for wheezing or shortness of breath. 06/26/20  Yes Gabriel Earing, FNP  aspirin 81 MG EC tablet Take 81 mg by mouth daily.   Yes [provider]  carvedilol (COREG) 6.25 MG tablet Take 1 tablet (6.25 mg total) by mouth 2 (two) times daily with a meal. 08/26/22  Yes Dettinger, Elige Radon, MD  Cholecalciferol (VITAMIN D3) 2000 UNITS TABS Take 2,000 Units by mouth daily.   Yes [provider]  Coenzyme Q10 (CO Q 10) 100 MG CAPS Take 100 mg by mouth daily.   Yes [provider]  methimazole (TAPAZOLE) 5  MG tablet Take 1 tablet (5 mg total) by mouth 2 (two) times a week. 12/27/21  Yes Dettinger, Elige Radon, MD  Omega-3 Fatty Acids (FISH OIL) 1000 MG CAPS Take 1,000 mg by mouth daily.   Yes [provider]  pantoprazole (PROTONIX) 40 MG tablet Take 1 tablet (40 mg total) by mouth 2 (two) times daily. 12/24/21  Yes Dettinger, Elige Radon, MD  rosuvastatin (CRESTOR) 10 MG tablet Take 1 tablet (10 mg total) by mouth daily. 12/24/21  Yes Dettinger, Elige Radon, MD  telmisartan (MICARDIS) 40 MG tablet Take 0.5 tablets (20 mg total) by mouth daily. 12/24/21  Yes Dettinger, Elige Radon, MD  tamsulosin (FLOMAX) 0.4 MG CAPS capsule Take 1 capsule (0.4 mg total) by mouth daily. Patient not taking: Reported on Oct 23, 2022 12/24/21   Dettinger, Elige Radon, MD     Critical care time:     This patient is critically ill with multiple organ system failure which requires frequent high complexity decision making, assessment, support, evaluation, and titration of therapies. This was completed  through the application of advanced monitoring technologies and extensive interpretation of multiple databases.  During this encounter critical care time was devoted to patient care services described in this note for 35 minutes.    Cheri Fowler, MD [facility] See Amion for pager If no response to pager, please call [phone] until 7pm After 7pm, Please call E-link [phone]

## 2022-10-09 NOTE — IPAL (Signed)
Interdisciplinary Goals of Care Family Meeting   Date carried out:: 10/09/2022  Location of the meeting: Bedside  Member's involved: Physician, Bedside Registered Nurse, Social Worker, and Family Member or next of kin  Durable Power of Attorney or Environmental health practitioner: Mary Claw/Melissa Vernon   Discussion: We discussed goals of care for Mario Smith .    The Clinical status was relayed to patient's wife and daughter at bedside in detail.   Updated and notified of patients medical condition. We discussed that he is doing well on ventilator, tolerating spontaneous breathing trial and ready to come off of ventilator.  He had large stroke with hemorrhage in the brain which can make his swallowing muscles weaker, that puts him at risk of aspiration into the lungs. We discussed that we will try to come off of ventilator tomorrow, if he does well then he will be discharged to rehab facility but if he continues to struggle with breathing and aspiration, will need to decide either we will put him back on ventilator and end up in tracheostomy versus keeping him comfortable.  Patient wife and daughter stated that he would not want tracheostomy and living in nursing home, so it was decided that we will try to extubate him tomorrow if he does well then we will send him to rehab but if he does not then we will keep him comfortable and will try to get him home with hospice to let him die peacefully where he would like to be.   Code status: Full DNR  Disposition: Continue current acute care    Family are satisfied with Plan of action and management. All questions answered   Cheri Fowler MD [facility] See Amion for pager If no response to pager, please call [phone] until 7pm After 7pm, Please call E-link [phone]

## 2022-10-10 ENCOUNTER — Inpatient Hospital Stay (HOSPITAL_COMMUNITY): Payer: No Typology Code available for payment source

## 2022-10-10 DIAGNOSIS — Z4659 Encounter for fitting and adjustment of other gastrointestinal appliance and device: Secondary | ICD-10-CM | POA: Diagnosis not present

## 2022-10-10 DIAGNOSIS — I639 Cerebral infarction, unspecified: Secondary | ICD-10-CM | POA: Diagnosis not present

## 2022-10-10 DIAGNOSIS — I619 Nontraumatic intracerebral hemorrhage, unspecified: Secondary | ICD-10-CM | POA: Diagnosis not present

## 2022-10-10 DIAGNOSIS — J9601 Acute respiratory failure with hypoxia: Secondary | ICD-10-CM | POA: Diagnosis not present

## 2022-10-10 LAB — CULTURE, RESPIRATORY W GRAM STAIN

## 2022-10-10 LAB — GLUCOSE, CAPILLARY
Glucose-Capillary: 103 mg/dL — ABNORMAL HIGH (ref 70–99)
Glucose-Capillary: 118 mg/dL — ABNORMAL HIGH (ref 70–99)
Glucose-Capillary: 127 mg/dL — ABNORMAL HIGH (ref 70–99)
Glucose-Capillary: 146 mg/dL — ABNORMAL HIGH (ref 70–99)
Glucose-Capillary: 153 mg/dL — ABNORMAL HIGH (ref 70–99)
Glucose-Capillary: 158 mg/dL — ABNORMAL HIGH (ref 70–99)
Glucose-Capillary: 91 mg/dL (ref 70–99)

## 2022-10-10 LAB — CBC
HCT: 38.8 % — ABNORMAL LOW (ref 39.0–52.0)
Hemoglobin: 12.7 g/dL — ABNORMAL LOW (ref 13.0–17.0)
MCH: 31.4 pg (ref 26.0–34.0)
MCHC: 32.7 g/dL (ref 30.0–36.0)
MCV: 95.8 fL (ref 80.0–100.0)
Platelets: 76 10*3/uL — ABNORMAL LOW (ref 150–400)
RBC: 4.05 MIL/uL — ABNORMAL LOW (ref 4.22–5.81)
RDW: 14.9 % (ref 11.5–15.5)
WBC: 7.6 10*3/uL (ref 4.0–10.5)
nRBC: 0 % (ref 0.0–0.2)

## 2022-10-10 LAB — BASIC METABOLIC PANEL
Anion gap: 6 (ref 5–15)
BUN: 22 mg/dL (ref 8–23)
CO2: 25 mmol/L (ref 22–32)
Calcium: 8.4 mg/dL — ABNORMAL LOW (ref 8.9–10.3)
Chloride: 127 mmol/L — ABNORMAL HIGH (ref 98–111)
Creatinine, Ser: 1.3 mg/dL — ABNORMAL HIGH (ref 0.61–1.24)
GFR, Estimated: 55 mL/min — ABNORMAL LOW (ref >60)
Glucose, Bld: 172 mg/dL — ABNORMAL HIGH (ref 70–99)
Potassium: 3.6 mmol/L (ref 3.5–5.1)
Sodium: 158 mmol/L — ABNORMAL HIGH (ref 135–145)

## 2022-10-10 LAB — SODIUM: Sodium: 156 mmol/L — ABNORMAL HIGH (ref 135–145)

## 2022-10-10 MED ORDER — POTASSIUM CHLORIDE 10 MEQ/100ML IV SOLN
10.0000 meq | INTRAVENOUS | Status: AC
  Administered 2022-10-10 (×4): 10 meq via INTRAVENOUS
  Filled 2022-10-10 (×4): qty 100

## 2022-10-10 MED ORDER — ROSUVASTATIN CALCIUM 20 MG PO TABS
20.0000 mg | ORAL_TABLET | Freq: Every day | ORAL | Status: DC
  Administered 2022-10-10 – 2022-10-11 (×2): 20 mg
  Filled 2022-10-10 (×2): qty 1

## 2022-10-10 MED ORDER — BANATROL TF EN LIQD
60.0000 mL | Freq: Two times a day (BID) | ENTERAL | Status: DC
  Administered 2022-10-10 – 2022-10-11 (×3): 60 mL
  Filled 2022-10-10 (×3): qty 60

## 2022-10-10 MED ORDER — METOPROLOL TARTRATE 5 MG/5ML IV SOLN
5.0000 mg | Freq: Once | INTRAVENOUS | Status: AC
  Administered 2022-10-10: 5 mg via INTRAVENOUS
  Filled 2022-10-10: qty 5

## 2022-10-10 MED ORDER — CHLORHEXIDINE GLUCONATE CLOTH 2 % EX PADS
6.0000 | MEDICATED_PAD | Freq: Every day | CUTANEOUS | Status: DC
  Administered 2022-10-10 – 2022-10-12 (×3): 6 via TOPICAL

## 2022-10-10 MED ORDER — POLYVINYL ALCOHOL 1.4 % OP SOLN
1.0000 [drp] | OPHTHALMIC | Status: DC | PRN
  Administered 2022-10-10: 1 [drp] via OPHTHALMIC
  Filled 2022-10-10: qty 15

## 2022-10-10 MED ORDER — METOPROLOL TARTRATE 5 MG/5ML IV SOLN
5.0000 mg | Freq: Four times a day (QID) | INTRAVENOUS | Status: DC | PRN
  Administered 2022-10-11 (×2): 5 mg via INTRAVENOUS
  Filled 2022-10-10 (×2): qty 5

## 2022-10-10 NOTE — Progress Notes (Signed)
Mario Smith, MRN:  [id number], DOB:  [birth year], LOS: 5 ADMISSION DATE:  30-Oct-2022, CONSULTATION DATE: 10/07/2022 REFERRING MD: Dr. Roda Shutters, CHIEF COMPLAINT: Fever  History of Present Illness:  82 year old male with hypertension, hyperlipidemia and tuberculosis status posttreatment who was admitted with acute right MCA stroke, received TNK, complicated with hemorrhagic conversion leading to large intraparenchymal hemorrhage on right side.  Patient was started on 3% hypertonic saline.  He started spiking fever with Tmax 102, PCCM was consulted for help evaluation medical management  Patient denies chest pain, shortness of breath, nausea, vomiting, cough and dysuria  Pertinent  Medical History   Past Medical History:  Diagnosis Date   Abdominal pain 10/10/2017   Arthritis    Back pain    Colon polyp 2011   tubular adenoma.  no HG dysplasia.     Constipation    COPD (chronic obstructive pulmonary disease) (HCC)    no per pt   Dehydration 10/10/2017   Dyspnea    with exertion   ED (erectile dysfunction)    Essential hypertension, benign    Gallbladder sludge 10/2017   GERD (gastroesophageal reflux disease)    acute gastritis   Graves disease    History of kidney stones    HTN (hypertension)    Other and unspecified hyperlipidemia    PONV (postoperative nausea and vomiting)    Pt's wife also said that when he had a nerve block in 2015 here, pt had a reaction immediately. 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- finished medication   Vertigo      Significant Hospital Events: Including procedures, antibiotic start and stop dates in addition to other pertinent events   5/6 plan for one way extubation today  Interim History / Subjective:  Family has been discussing GOC with Dr. Merrily Pew, plan is to wean on vent and one way extubation, family is at the bedside and are  comfortable with this plan  Objective   Blood pressure (!) 152/85, pulse 65, temperature 99.3 F (37.4 C), temperature source Axillary, resp. rate 19, weight 76 kg, SpO2 97 %.    Vent Mode: PRVC FiO2 (%):  [40 %] 40 % Set Rate:  [16 bmp] 16 bmp Vt Set:  [520 mL] 520 mL PEEP:  [5 cmH20] 5 cmH20 Pressure Support:  [8 cmH20] 8 cmH20 Plateau Pressure:  [12 cmH20-18 cmH20] 14 cmH20   Intake/Output Summary (Last 24 hours) at 10/10/2022 0848 Last data filed at 10/10/2022 0800 Gross per 24 hour  Intake 2669.62 ml  Output 1600 ml  Net 1069.62 ml    Filed Weights   10-30-2022 1418 10/09/22 0400  Weight: 78.8 kg 76 kg    General:  critically ill elderly M, intubated and awake HEENT: MM pink/moist, ETT in place Neuro: awake, following some commands on low dose precedex CV: s1s2 rrr, no m/r/g PULM:  course breath sounds bilaterally on PSV 5/5 with good tidal volumes and RR in 20's, thin secretions  GI: soft, non-distended  Extremities: warm/dry, no edema  Skin: no rashes or lesions  Platelets 76 Na 158 Creatinine 1.3 Glu 158 K 3.6  Resolved Hospital Problem list     Assessment & Plan:    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 Merrily Pew, family relayed pt would not want trach/LTACH -pt is weaning well on the ventilator again today, plan is for one-way extubation today -complete course of Unasyn after spiking fevers -Continue neuro watch every hour -Stroke team is following -Continue secondary stroke prophylaxis -Holding antiplatelet and anticoagulation --Maintain full vent support with SAT/SBT as tolerated -titrate Vent setting to maintain SpO2 greater than or equal to 90%. -HOB elevated  30 degrees. -Plateau pressures less than 30 cm H20.  -Follow chest x-ray, ABG prn.   -Bronchial hygiene and RT/bronchodilator protocol.    Cerebral edema with brain compression Induced hypernatremia/hyperchloremia Hypokalemia Now off hypertonic saline  -continue to monitor sodium and K, 3.6 today, give an additional -Keep head end of the bed elevated >30 -Maintain euthymia -Seizure precautions -Continue with electrolyte supplement and monitor    Paroxysmal A-fib with controlled rate Patient heart rate is controlled ranging between 90-100, he remains in A-fib, likely that is the reason for acute stroke -Continue Coreg -Holding anticoagulation in the setting of intraparenchymal hemorrhage -Continue telemetry monitoring  Hypertension -Continue amlodipine, Coreg and irbesartan  CKD stage IIIa -Serum creatinine is at baseline, monitor intake and output -continue to monitor serum creatinine -Avoid nephrotoxic agents  Anemia and thrombocytopenia of critical illness -Monitor H&H and platelet count -platelets stable at 76, monitor for signs of bleeding  Hyperthyroidism Patient's TSH is 1.2 with normal free T3 and T4 -continue methimazole  Best Practice (right click and "Reselect all SmartList Selections" daily)   Diet/type: Tube feeds DVT prophylaxis: SCD GI prophylaxis: PPI Lines: Central line Foley:  Yes, and it is still needed Code Status:  DNR, but short-term intubation Last date of multidisciplinary goals of care discussion [5/5 plan for one way extubation today, family updated at the bedside on 5/6]  Labs   CBC: Recent Labs  Lab 10/07/2022 1402 11/01/2022 1409 10/23/2022 1817 10/07/22 0545 10/08/22 1540 10/09/22 0125 10/09/22 0626 10/10/22 0503  WBC 6.6  --  6.7 8.7  --  8.9 8.6 7.6  NEUTROABS 4.5  --   --  6.6  --   --   --   --   HGB 15.1   < > 14.3 12.3* 10.2* 11.8* 13.2 12.7*  HCT 45.3   < > 43.6 38.0* 30.0* 35.8* 41.6 38.8*  MCV 93.2  --  94.2 96.4   --  95.2 96.3 95.8  PLT 131*  --  126* 102*  --  83* 78* 76*   < > = values in this interval not displayed.     Basic Metabolic Panel: Recent Labs  Lab 10/11/2022 1402 11/01/2022 1409 10/29/2022 1817 10/06/22 0617 10/06/22 1243 10/07/22 0545 10/07/22 1240 10/08/22 1540 10/08/22 1747 10/09/22 0027 10/09/22 0626 10/09/22 1302 10/09/22 1719 10/09/22 2338  NA 135 140   < > 144   < > 154*   < > 155*   < > 153* 154* 156* 158* 156*  K 3.9 4.1  --   --   --  3.8  --  3.3*  --  4.4 3.2*  --   --   --   CL 104 107  --   --   --  122*  --   --   --  125* 125*  --   --   --   CO2 24  --   --   --   --  21*  --   --   --  20* 22  --   --   --   GLUCOSE 103* 102*  --   --   --  125*  --   --   --  142* 212*  --   --   --   BUN 19 20  --   --   --  13  --   --   --  30* 27*  --   --   --   CREATININE 1.22 1.20  --   --   --  1.30*  --   --   --  1.34* 1.25*  --   --   --   CALCIUM 8.7*  --   --   --   --  8.8*  --   --   --  8.6* 8.5*  --   --   --   MG  --   --   --  2.1  --   --   --   --   --   --   --   --   --   --    < > = values in this interval not displayed.    GFR: Estimated Creatinine Clearance: 42.6 mL/min (A) (by C-G formula based on SCr of 1.25 mg/dL (H)). Recent Labs  Lab 10/07/22 0545 10/09/22 0125 10/09/22 0626 10/10/22 0503  PROCALCITON <0.10  --   --   --   WBC 8.7 8.9 8.6 7.6  LATICACIDVEN 1.1  --   --   --      Liver Function Tests: Recent Labs  Lab 10-16-22 1402 10/07/22 0545  AST 20 17  ALT 16 14  ALKPHOS 63 38  BILITOT 0.9 0.9  PROT 6.9 6.0*  ALBUMIN 3.9 3.2*    Recent Labs  Lab 10/07/22 0545  LIPASE 38    No results for input(s): "AMMONIA" in the last 168 hours.  ABG    Component Value Date/Time   PHART 7.398 10/08/2022 1540   PCO2ART 41.1 10/08/2022 1540   PO2ART 399 (H) 10/08/2022 1540   HCO3 25.4 10/08/2022 1540   TCO2 27 10/08/2022 1540   O2SAT 100 10/08/2022 1540     Coagulation Profile: Recent Labs  Lab 2022-10-16 1402  10-16-22 1817  INR 0.9 1.0     Cardiac Enzymes: No results for input(s): "CKTOTAL", "CKMB", "CKMBINDEX", "TROPONINI" in the last 168 hours.  HbA1C: Hgb A1c MFr Bld  Date/Time Value Ref Range Status  10/06/2022 06:17 AM 5.4 4.8 - 5.6 % Final    Comment:    (NOTE) Pre diabetes:          5.7%-6.4%  Diabetes:              >6.4%  Glycemic control for   <7.0% adults with diabetes     CBG: Recent Labs  Lab 10/09/22 1538 10/09/22 1956 10/10/22 0007 10/10/22 0406 10/10/22 0801  GLUCAP 142* 109* 118* 158* 146*     Review of Systems:   12 point review of system is significant for complaint mentioned HPI, rest negative  Past Medical History:  He,  has a past medical history of Abdominal pain (10/10/2017), Arthritis, Back pain, Colon polyp (2011), Constipation, COPD (chronic obstructive pulmonary disease) (HCC), Dehydration (10/10/2017), Dyspnea, ED (erectile dysfunction), Essential hypertension, benign, Gallbladder sludge (10/2017), GERD (gastroesophageal reflux disease), Graves disease, History of kidney stones, HTN (hypertension), Other and unspecified hyperlipidemia, PONV (postoperative nausea and vomiting), Thrombocytopenia (  HCC), Tuberculosis, and Vertigo.   Surgical History:   Past Surgical History:  Procedure Laterality Date   CATARACT EXTRACTION Bilateral    CHOLECYSTECTOMY N/A 01/18/2018   Procedure: LAPAROSCOPIC CHOLECYSTECTOMY WITH INTRAOPERATIVE CHOLANGIOGRAM ERAS PATHWAY;  Surgeon: Griselda Miner, MD;  Location: [facility] OR;  Service: General;  Laterality: N/A;   COLONOSCOPY  03/2010    Dr Walker Kehr, [facility]. Higher risk screening study, sister's hx of colon CA.     ESOPHAGOGASTRODUODENOSCOPY (EGD) WITH PROPOFOL N/A 10/11/2017   Procedure: ESOPHAGOGASTRODUODENOSCOPY (EGD) WITH PROPOFOL;  Surgeon: Napoleon Form, MD;  Location: [facility];  Service: Endoscopy;  Laterality: N/A;   EYE SURGERY     lid-lift   SPINAL FUSION  2001   cervical   TENDON REPAIR  Right 05/16/2014   Procedure: RIGHT RING FINGER FLEXOR TENDON REPAIR WITH PULL OUT BUTTON;  Surgeon: Dominica Severin, MD;  Location: [facility];  Service: Orthopedics;  Laterality: Right;   UPPER GASTROINTESTINAL ENDOSCOPY       Social History:   reports that he quit smoking about 29 years ago. His smoking use included cigarettes. He quit smokeless tobacco use about 29 years ago.  His smokeless tobacco use included chew. He reports that he does not currently use alcohol. He reports that he does not use drugs.   Family History:  His family history includes Alcohol abuse in his father; COPD in his father and mother; Cancer in his brother and sister; Colon cancer in his sister; Emphysema in his sister and sister; Healthy in his daughter; Heart disease in his father and mother; Prostate cancer in his brother. There is no history of Esophageal cancer, Stomach cancer, or Rectal cancer.   Allergies Allergies  Allergen Reactions   Simvastatin Other (See Comments)    Leg cramps   Sulfa Antibiotics     UNSPECIFIED REACTION    Codeine Nausea And Vomiting   Doxycycline Nausea And Vomiting   Hydrocodone Nausea And Vomiting   Meclizine Nausea Only   Niacin And Related Other (See Comments)    Flushing in the face and redness    Ondansetron Other (See Comments)    Tremors    Phenergan [Promethazine Hcl] Other (See Comments)    Tremors      Home Medications  Prior to Admission medications   Medication Sig Start Date End Date Taking? Authorizing Provider  albuterol (VENTOLIN HFA) 108 (90 Base) MCG/ACT inhaler Inhale 2 puffs into the lungs every 6 (six) hours as needed for wheezing or shortness of breath. 06/26/20  Yes Gabriel Earing, FNP  aspirin 81 MG EC tablet Take 81 mg by mouth daily.   Yes [provider]  carvedilol (COREG) 6.25 MG tablet Take 1 tablet (6.25 mg total) by mouth 2 (two) times daily with a meal. 08/26/22  Yes Dettinger, Elige Radon, MD  Cholecalciferol  (VITAMIN D3) 2000 UNITS TABS Take 2,000 Units by mouth daily.   Yes [provider]  Coenzyme Q10 (CO Q 10) 100 MG CAPS Take 100 mg by mouth daily.   Yes [provider]  methimazole (TAPAZOLE) 5 MG tablet Take 1 tablet (5 mg total) by mouth 2 (two) times a week. 12/27/21  Yes Dettinger, Elige Radon, MD  Omega-3 Fatty Acids (FISH OIL) 1000 MG CAPS Take 1,000 mg by mouth daily.   Yes [provider]  pantoprazole (PROTONIX) 40 MG tablet Take 1 tablet (40 mg total) by mouth 2 (two) times daily. 12/24/21  Yes Dettinger, Elige Radon, MD  rosuvastatin (CRESTOR) 10 MG tablet Take 1 tablet (10 mg total) by mouth daily. 12/24/21  Yes Dettinger, Elige Radon, MD  telmisartan (MICARDIS) 40 MG tablet Take 0.5 tablets (20 mg total) by mouth daily. 12/24/21  Yes Dettinger, Elige Radon, MD  tamsulosin (FLOMAX) 0.4 MG CAPS capsule Take 1 capsule (0.4 mg total) by mouth daily. Patient not taking: Reported on Oct 23, 2022 12/24/21   Dettinger, Elige Radon, MD     Critical care time:  35 minutes      CRITICAL CARE Performed by: Darcella Gasman Dany Walther   Total critical care time: 35 minutes  Critical care time was exclusive of separately billable procedures and treating other patients.  Critical care was necessary to treat or prevent imminent or life-threatening deterioration.  Critical care was time spent personally by me on the following activities: development of treatment plan with patient and/or surrogate as well as nursing, discussions with consultants, evaluation of patient's response to treatment, examination of patient, obtaining history from patient or surrogate, ordering and performing treatments and interventions, ordering and review of laboratory studies, ordering and review of radiographic studies, pulse oximetry and re-evaluation of patient's condition.  Darcella Gasman Lasasha Brophy, PA-C  Pulmonary & Critical care See Amion for pager If no response to pager , please call [phone] until 7pm After 7:00  pm call Elink  [phone]

## 2022-10-10 NOTE — Progress Notes (Addendum)
STROKE TEAM PROGRESS NOTE   INTERVAL HISTORY  Patient evaluated in room with wife at bedside. He is on  low dose precedex but awake and interactive. Following commands consistently but does continue to have L hemiplegia and R gaze deviation. He is tolerating SBTs. Plan today is one-way extubation to see if he tolerates this given that he would not want trach/LTACH. He is on unasyn given fevers and concern for aspiration pneumonia.   Vitals:   10/10/22 0900 10/10/22 1000 10/10/22 1200 10/10/22 1210  BP: (!) 151/78   (!) 164/87  Pulse: 70     Resp: 19     Temp:   (!) 102.4 F (39.1 C)   TempSrc:   Axillary   SpO2: 100% 99%    Weight:       CBC:  Recent Labs  Lab 10/26/2022 1402 10/29/2022 1409 10/07/22 0545 10/08/22 1540 10/09/22 0626 10/10/22 0503  WBC 6.6   < > 8.7   < > 8.6 7.6  NEUTROABS 4.5  --  6.6  --   --   --   HGB 15.1   < > 12.3*   < > 13.2 12.7*  HCT 45.3   < > 38.0*   < > 41.6 38.8*  MCV 93.2   < > 96.4   < > 96.3 95.8  PLT 131*   < > 102*   < > 78* 76*   < > = values in this interval not displayed.   Basic Metabolic Panel:  Recent Labs  Lab 10/06/22 0617 10/06/22 1243 10/09/22 0626 10/09/22 1302 10/09/22 2338 10/10/22 0503  NA 144   < > 154*   < > 156* 158*  K  --    < > 3.2*  --   --  3.6  CL  --    < > 125*  --   --  127*  CO2  --    < > 22  --   --  25  GLUCOSE  --    < > 212*  --   --  172*  BUN  --    < > 27*  --   --  22  CREATININE  --    < > 1.25*  --   --  1.30*  CALCIUM  --    < > 8.5*  --   --  8.4*  MG 2.1  --   --   --   --   --    < > = values in this interval not displayed.   Lipid Panel:  Recent Labs  Lab 10/06/22 0617  CHOL 130  TRIG 64  HDL 38*  CHOLHDL 3.4  VLDL 13  LDLCALC 79   HgbA1c:  Recent Labs  Lab 10/06/22 0617  HGBA1C 5.4   Urine Drug Screen:  Recent Labs  Lab 10/25/2022 1553  LABOPIA NONE DETECTED  COCAINSCRNUR NONE DETECTED  LABBENZ NONE DETECTED  AMPHETMU NONE DETECTED  THCU NONE DETECTED  LABBARB  NONE DETECTED    Alcohol Level  Recent Labs  Lab 10/07/2022 1402  ETH <10   IMAGING past 24 hours No results found.  PHYSICAL EXAM  Temp:  [99.3 F (37.4 C)-102.4 F (39.1 C)] 102.4 F (39.1 C) (05/06 1200) Pulse Rate:  [40-144] 70 (05/06 0900) Resp:  [15-31] 19 (05/06 0900) BP: (103-164)/(58-109) 164/87 (05/06 1210) SpO2:  [96 %-100 %] 99 % (05/06 1000) FiO2 (%):  [30 %-40 %] 30 % (05/06 0748)  Physical Exam: General:  critically ill appearing elderly male, laying in bed, intubated. Psych: unable to assess given intubated. HEENT: normocephalic and atraumatic. Bilateral proptosis with anicteric sclera. ETT and OGT in place. CV: irregularly irregular rhythm, no m/r/g. Pulm: coarse mechanical breath sounds bilaterally, no wheezing or rhonchi. Abdomen: soft, nontender, nondistended, normoactive bowel sounds.  Neuro -- examined while on low dose precedex Mental Status: awake and alert. Follows commands consistently. Unable to assess orientation given ETT in place.  Cranial Nerves: II: PERRL III, IV, VI: bilateral proptosis noted. R gaze preference, does not cross midline towards left. V: unable to evaluate for facial sensation. VII: symmetric VIII: hearing intact to voice XII: difficult to assess given ETT in place.  Motor: Moves RUE and RLE to command with good antigravity strength. L hemiplegia but does withdraw LLE to pain. Coarse tremor of RUE noted on exam. Tone decreased in LUE and LLE, normal bulk. Sensation: intact on R side, LLE withdraws to pain. LUE does not withdraw to pain. Coordination: unable to test. Gait: deferred for patient's safety.   ASSESSMENT/PLAN [name] is a 82 y.o. male with history of hypertension, hyperlipidemia, thrombocytopenia, Graves' disease with exophthalmos, Mnire's disease, hard of hearing at baseline uses hearing aids, cervical spine fusion, latent tuberculosis s/p treatment, COPD  presenting with ischemic stroke and secondary  R cortical ICH secondary to TNK.   Stroke: right MCA infarct status post TNK, complicated by large parenchymal hemorrhagic infarct with IVH, stroke embolic pattern, likely due to new diagnosed A-fib Code Stroke:CT head with subtle loss of gray-white differentiation in R frontal lobe. Chronic small vessel disease. Atrophy. Remote R parietal cortical infarct. ASPECTS 9.    CTA head & neck: No large vessel occlusion or proximal hemodynamically significant stenosis CT repeat for headache showed large parenchymal hematoma centered in the right frontal lobe, SAH seen in the right sylvian fissure.  Mass effect on right lateral ventricle CT repeat slight interval increase of right parenchymal hematoma and ICH, interval extension right > left IVH into lateral ventricles, enlargement of temporal and occipital horns, concerning for hydrocephalus.  Right posterior frontal infarct. CT repeat 5/2 stable parenchymal hematoma and IVH. CTH repeat 5/4: No evidence of new or additional bleeding.  IPH in the right temporal/parietal region as before, with slight clot evolution as expected.  Amount of IVH and SAH blood is not increased.  Ventricular size is not increased. MRI brain: Large intraparenchymal hematoma in the right cerebral hemisphere with intraventricular and subarachnoid extension of hemorrhage, similar to prior CT. Subacute infarct in the posterior right frontal lobe, corresponding to area of hypodensity seen on prior CT. Small remote infarcts in the bilateral cerebellar hemispheres.   2D Echo: EF 60-65%  LDL 79 HgbA1c 5.4 VTE prophylaxis - SCD  aspirin 81 mg daily prior to admission, now on No antithrombotic.  Therapy recommendations: CIR Disposition:  Pending  Cerebral edema CT showed large ICH with mass effect on the right ventricle but no significant midline shift On 3% saline @75  -> 50 ml/hr-> 25 mL/h --> off 5/6 S/p 3% bolus x 1 on 5/2 Na monitoring q6h Na 140->144->146 -> 154 ->153-> 155 ->  154 > 156 > 158  IVH, risk of hydrocephalus CT head concerning for developing hydrocephalus Repeat CT IVH stable MRI showed stable IVH NSGY consult if patient declines, and if the family indicates this would be within goals of care at the time   Afib, new diagnosis Telemetry and EKG captured A-fib Possible cause of stroke this time Hold  off AC now due to hemorrhage May consider AC once hemorrhage resolves and neuro stable  Hypertension Home meds:  Telmisartan 20mg , Carvedilol 6.25mg  BID On norvasc, coreg, and irbesartan Aggressive BP control, goal SBP < 160 Long-term BP goal normotensive  Hyperlipidemia Home meds:  Crestor 10mg  daily LDL 79, goal < 70 Restarted crestor at 20mg  daily Continue statin at discharge  Dysphagia with concern for ongoing aspiration with PO intake Passed swallow initially and was recommended dysphagia 1 and honey thick liquid NPO decision 5/4 with NGT placement for feeds and meds Continue IV Unasyn for aspiration pneumonia  Hypoxic respiratory failure requiring intubation  Likely secondary to aspiration pneumonia  Continue IV antibiotics One-way extubation today NPO NGT for medications  SLP prior to PO intake   Other Stroke Risk Factors Advanced Age >/= 78   Other Active Problems Graves Dz status post left eye surgery causing ptosis, methimazole 5mg  twice weekly Right hand resting tremor, concerning for Parkinson disease.  Patient has no neurology following PTA.   Hospital day # 5   Merrilyn Puma, MD [facility], PGY-3 10/10/2022, 1:29 PM  I have personally obtained history,examined this patient, reviewed notes, independently viewed imaging studies, participated in medical decision making and plan of care.ROS completed by me personally and pertinent positives fully documented  I have made any additions or clarifications directly to the above note. Agree with note above.  Patient appears to be doing much better on antibiotics and is  tolerating ventilator wean and extubate as tolerated.  Long discussion at the bedside with the patient's wife regarding his significant hemiplegia and difficulty protecting his airway.  The patient redeveloped respiratory distress DNR do not reintubate as needed will likely need tracheostomy and PEG tube and nursing home placement which the wife feels patient would not have wanted.  Discussed with Dr. Merrily Pew critical care medicine.This patient is critically ill and at significant risk of neurological worsening, death and care requires constant monitoring of vital signs, hemodynamics,respiratory and cardiac monitoring, extensive review of multiple databases, frequent neurological assessment, discussion with family, other specialists and medical decision making of high complexity.I have made any additions or clarifications directly to the above note.This critical care time does not reflect procedure time, or teaching time or supervisory time of PA/NP/Med Resident etc but could involve care discussion time.  I spent 30 minutes of neurocritical care time  in the care of  this patient.     Delia Heady, MD Medical Director [facility] Pager: [phone] 10/10/2022 4:08 PM   To contact Stroke Continuity provider, please refer to [email or web address]. After hours, contact General Neurology

## 2022-10-10 NOTE — Progress Notes (Signed)
eLink Physician-Brief Progress Note Patient Name: Mario Smith DOB: November 24, 1940 MRN: [id number]   Date of Service  10/10/2022  HPI/Events of Note  82 year old male initially presented with right ischemic MCA stroke status post tenecteplase that eventually developed a hemorrhagic conversion with IPH.  Lactulose had been ordered in the setting of his constipation, but the patient has not received any doses and has been otherwise hemodynamically stable.  He was extubated, remains DNR, and may proceed with comfort care.  eICU Interventions  At this point, DC lactulose  Check ammonia in the morning     Intervention Category Minor Interventions: Routine modifications to care plan (e.g. PRN medications for pain, fever)  Mario Smith 10/10/2022, 9:23 PM

## 2022-10-10 NOTE — Progress Notes (Signed)
PT Cancellation Note  Patient Details Name: Mario Smith MRN: [id number] DOB: [birth year]   Cancelled Treatment:    Reason Eval/Treat Not Completed: Patient at procedure or test/unavailable; had attempted earlier and pt getting corerak.  Unable to return with +2, will continue attempts.    Elray Mcgregor 10/10/2022, 4:10 PM Sheran Lawless, PT Acute Rehabilitation Services Office:[phone] 10/10/2022

## 2022-10-10 NOTE — Procedures (Signed)
Cortrak  Person Inserting Tube:  Azalea Cedar T, RD Tube Type:  Cortrak - 43 inches Tube Size:  10 Tube Location:  Left nare Secured by: Bridle Technique Used to Measure Tube Placement:  Marking at nare/corner of mouth Cortrak Secured At:  68 cm   Cortrak Tube Team Note:  Consult received to place a Cortrak feeding tube.   X-ray is required, abdominal x-ray has been ordered by the Cortrak team. Please confirm tube placement before using the Cortrak tube.   If the tube becomes dislodged please keep the tube and contact the Cortrak team at [email or web address] for replacement.  If after hours and replacement cannot be delayed, place a NG tube and confirm placement with an abdominal x-ray.    Shelle Iron RD, LDN For contact information, refer to [facility].

## 2022-10-10 NOTE — Progress Notes (Signed)
Inpatient Rehab Admissions Coordinator:   Per therapy recommendations, patient was screened for CIR candidacy by Megan Salon, MS, CCC-SLP . Pt. Is not at a level to tolerate intensity of CIR; however,   Pt. may have potential to progress to becoming a potential CIR candidate, so CIR admissions team will follow and monitor for progress and participation with therapies and place consult order if Pt. appears to be an appropriate candidate. Please contact me with any questions.   Megan Salon, MS, CCC-SLP Rehab Admissions Coordinator  [phone] (celll) [phone] (office)

## 2022-10-10 NOTE — Progress Notes (Signed)
SLP Cancellation Note  Patient Details Name: Mario Smith MRN: [id number] DOB: November 21, 1940   Cancelled treatment:    Pt was intubated over weekend.  Planning one-way extubation today.  SLP will follow along in chart to determine if our services will be beneficial.  [name], MA CCC/SLP Clinical Specialist - Acute Care SLP Acute Rehabilitation Services Office number [phone]        [name] 10/10/2022, 11:44 AM

## 2022-10-10 NOTE — Progress Notes (Signed)
Nutrition Follow-up  DOCUMENTATION CODES:   Not applicable  INTERVENTION:   Tube feeding via Cortrak tube: Osmolite 1.5 at 50 ml/h (1200 ml per day) Prosource TF20 60 ml daily  Provides 1880 kcal, 95 gm protein, 914 ml free water daily  Pt candidate for PEPuP  Banatrol BID   NUTRITION DIAGNOSIS:   Inadequate oral intake related to dysphagia as evidenced by NPO status. Ongoing.   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  Pt discussed during ICU rounds and with RN and MD.  Per family in room pt has had no recent weight changes. Wife not present at time of visit.   05/06 - one-way extubation; s/p cortrak placement xray pending    Medications reviewed and include: lactulose (held), protonix, senokot-s  Precedex   Labs reviewed:  Na 158   NUTRITION - FOCUSED PHYSICAL EXAM:  Flowsheet Row Most Recent Value  Orbital Region No depletion  Upper Arm Region No depletion  Thoracic and Lumbar Region No depletion  Buccal Region Mild depletion  Temple Region Mild depletion  Clavicle Bone Region No depletion  Clavicle and Acromion Bone Region No depletion  Scapular Bone Region Unable to assess  Dorsal Hand No depletion  Patellar Region Mild depletion  Anterior Thigh Region Mild depletion  Posterior Calf Region Mild depletion  Edema (RD Assessment) None  Hair Reviewed  Eyes Reviewed  Mouth Reviewed  Skin Reviewed  Nails Reviewed       Diet Order:   Diet  Order             Diet NPO time specified  Diet effective now                   EDUCATION NEEDS:   No education needs have been identified at this time  Skin:  Skin Assessment: Reviewed RN Assessment  Last BM:  250 ml + 3 BM now with FMS  Height:   Ht Readings from Last 1 Encounters:  09/26/22 5\' 7"  (1.702 m)    Weight:   Wt Readings from Last 1 Encounters:  10/09/22 76 kg    Ideal Body Weight:  67.3 kg  BMI:  Body mass index is 26.24 kg/m.  Estimated Nutritional Needs:   Kcal:  1750-1950  Protein:  90-105 grams  Fluid:  > 1.7 L   Danilyn Cocke P., RD, LDN, CNSC See AMiON for contact information

## 2022-10-11 ENCOUNTER — Inpatient Hospital Stay (HOSPITAL_COMMUNITY): Payer: No Typology Code available for payment source

## 2022-10-11 DIAGNOSIS — I639 Cerebral infarction, unspecified: Secondary | ICD-10-CM | POA: Diagnosis not present

## 2022-10-11 DIAGNOSIS — R509 Fever, unspecified: Secondary | ICD-10-CM | POA: Diagnosis not present

## 2022-10-11 DIAGNOSIS — J9601 Acute respiratory failure with hypoxia: Secondary | ICD-10-CM | POA: Diagnosis not present

## 2022-10-11 DIAGNOSIS — R4182 Altered mental status, unspecified: Secondary | ICD-10-CM | POA: Diagnosis not present

## 2022-10-11 LAB — MAGNESIUM: Magnesium: 2.3 mg/dL (ref 1.7–2.4)

## 2022-10-11 LAB — BASIC METABOLIC PANEL
Anion gap: 6 (ref 5–15)
BUN: 18 mg/dL (ref 8–23)
CO2: 24 mmol/L (ref 22–32)
Calcium: 8.3 mg/dL — ABNORMAL LOW (ref 8.9–10.3)
Chloride: 121 mmol/L — ABNORMAL HIGH (ref 98–111)
Creatinine, Ser: 1.14 mg/dL (ref 0.61–1.24)
GFR, Estimated: >60 mL/min (ref >60)
Glucose, Bld: 133 mg/dL — ABNORMAL HIGH (ref 70–99)
Potassium: 3.4 mmol/L — ABNORMAL LOW (ref 3.5–5.1)
Sodium: 151 mmol/L — ABNORMAL HIGH (ref 135–145)

## 2022-10-11 LAB — CBC
HCT: 38.4 % — ABNORMAL LOW (ref 39.0–52.0)
Hemoglobin: 12.5 g/dL — ABNORMAL LOW (ref 13.0–17.0)
MCH: 31.3 pg (ref 26.0–34.0)
MCHC: 32.6 g/dL (ref 30.0–36.0)
MCV: 96 fL (ref 80.0–100.0)
Platelets: 79 10*3/uL — ABNORMAL LOW (ref 150–400)
RBC: 4 MIL/uL — ABNORMAL LOW (ref 4.22–5.81)
RDW: 14.8 % (ref 11.5–15.5)
WBC: 9.1 10*3/uL (ref 4.0–10.5)
nRBC: 0 % (ref 0.0–0.2)

## 2022-10-11 LAB — CULTURE, BLOOD (ROUTINE X 2)
Culture: NO GROWTH
Special Requests: ADEQUATE

## 2022-10-11 LAB — GLUCOSE, CAPILLARY
Glucose-Capillary: 128 mg/dL — ABNORMAL HIGH (ref 70–99)
Glucose-Capillary: 130 mg/dL — ABNORMAL HIGH (ref 70–99)
Glucose-Capillary: 104 mg/dL — ABNORMAL HIGH (ref 70–99)
Glucose-Capillary: 109 mg/dL — ABNORMAL HIGH (ref 70–99)
Glucose-Capillary: 121 mg/dL — ABNORMAL HIGH (ref 70–99)

## 2022-10-11 LAB — AMMONIA: Ammonia: 18 umol/L (ref 9–35)

## 2022-10-11 MED ORDER — LABETALOL HCL 5 MG/ML IV SOLN
20.0000 mg | INTRAVENOUS | Status: DC | PRN
  Administered 2022-10-11 – 2022-10-12 (×10): 20 mg via INTRAVENOUS
  Filled 2022-10-11 (×10): qty 4

## 2022-10-11 MED ORDER — CARVEDILOL 12.5 MG PO TABS
12.5000 mg | ORAL_TABLET | Freq: Two times a day (BID) | ORAL | Status: DC
  Administered 2022-10-11 – 2022-10-12 (×2): 12.5 mg
  Filled 2022-10-11 (×2): qty 1

## 2022-10-11 MED ORDER — HYDRALAZINE HCL 20 MG/ML IJ SOLN
20.0000 mg | Freq: Four times a day (QID) | INTRAMUSCULAR | Status: DC | PRN
  Administered 2022-10-11 – 2022-10-12 (×2): 20 mg via INTRAVENOUS
  Filled 2022-10-11 (×2): qty 1

## 2022-10-11 MED ORDER — POTASSIUM CHLORIDE 20 MEQ PO PACK
40.0000 meq | PACK | Freq: Once | ORAL | Status: AC
  Administered 2022-10-11: 40 meq
  Filled 2022-10-11: qty 2

## 2022-10-11 MED ORDER — QUETIAPINE FUMARATE 25 MG PO TABS
25.0000 mg | ORAL_TABLET | Freq: Two times a day (BID) | ORAL | Status: DC
  Administered 2022-10-11 (×2): 25 mg
  Filled 2022-10-11 (×2): qty 1

## 2022-10-11 MED ORDER — ENOXAPARIN SODIUM 40 MG/0.4ML IJ SOSY
40.0000 mg | PREFILLED_SYRINGE | INTRAMUSCULAR | Status: DC
  Administered 2022-10-11: 40 mg via SUBCUTANEOUS
  Filled 2022-10-11: qty 0.4

## 2022-10-11 MED ORDER — DEXMEDETOMIDINE HCL IN NACL 400 MCG/100ML IV SOLN
0.0000 ug/kg/h | INTRAVENOUS | Status: DC
  Administered 2022-10-11: 0.6 ug/kg/h via INTRAVENOUS

## 2022-10-11 NOTE — Progress Notes (Signed)
Occupational Therapy Treatment Patient Details Name: Mario Smith MRN: [id number] DOB: 28-Mar-1941 Today's Date: 10/11/2022   History of present illness The pt is an 82 yo male presenting 5/1 with L-sided facial droop and slurred speech. Pt given TNK 15:01 5/1, developed headache and was found to have hemorrhagic conversion. PMH includes: COPD, dyspnea on exertion, HTN, graves disease, kidney stones, vertigo, and arthritis.   OT comments  This 82 yo male seen today in conjunction with PT due to dense hemiplegia on left and extubated yesterday (as a one-way extubation). Pt lethargic today keeping eye shut 99% of session. He was not following commands for [place] and when we sat him up on EOB he did not attempt to A with balance and eyes remained closed. With sitting up on EOB HR went up to as high as 158 and pt with increased work of breathing with RR in mid 20's). Currently recommending  continued inpatient follow up therapy, <3 hours/day. We will continue to follow.    Recommendations for follow up therapy are one component of a multi-disciplinary discharge planning process, led by the attending physician.  Recommendations may be updated based on patient status, additional functional criteria and insurance authorization.    Assistance Recommended at Discharge Frequent or constant Supervision/Assistance  Patient can return home with the following  Two people to help with walking and/or transfers;Two people to help with bathing/dressing/bathroom;Assistance with feeding;Help with stairs or ramp for entrance;Assist for transportation;Direct supervision/assist for financial management;Direct supervision/assist for medications management   Equipment Recommendations  Other (comment) (TBD next venue)       Precautions / Restrictions Precautions Precautions: Fall Precaution Comments: SBP <160 Restrictions Weight Bearing Restrictions: No       Mobility Bed Mobility Overal bed mobility: Needs  Assistance Bed Mobility: Supine to Sit, Sit to Supine   Sidelying to sit: Total assist, +2 for safety/equipment, HOB elevated   Sit to supine: Total assist, +2 for physical assistance   General bed mobility comments: Use of pad under him to A to sit up on EOB and to return to supine       Balance Overall balance assessment: Needs assistance Sitting-balance support: No upper extremity supported, Feet supported Sitting balance-Leahy Scale: Zero Sitting balance - Comments: pt not attempting to use UEs nor trunk to A with balance                                   ADL either performed or assessed with clinical judgement   ADL Overall ADL's : Needs assistance/impaired                                       General ADL Comments: total A +2 for at bed level    Extremity/Trunk Assessment Upper Extremity Assessment Upper Extremity Assessment: LUE deficits/detail LUE Deficits / Details: hand, wrist, elbow WNL for PROM, elbow and shoulder showing tone for extension, flexion/abduction respectively LUE Coordination: decreased gross motor;decreased fine motor            Vision Baseline Vision/History: 1 Wears glasses (readers) Additional Comments: Eyes closed 99% of session          Cognition Arousal/Alertness: Lethargic Behavior During Therapy: Flat affect Overall Cognitive Status: Impaired/Different from baseline Area of Impairment: Following commands, Awareness, Problem solving  Awareness: Intellectual Problem Solving: Decreased initiation General Comments: Eyes closed 99% of session, not following any commands                   Pertinent Vitals/ Pain       Pain Assessment Pain Assessment: CPOT Facial Expression: Grimacing Body Movements: Restlessness Muscle Tension: Tense, rigid Compliance with ventilator (intubated pts.): N/A Vocalization (extubated pts.): Sighing, moaning CPOT Total: 6 Pain  Location: headache Pain Descriptors / Indicators: Grimacing, Headache Pain Intervention(s): Limited activity within patient's tolerance, Monitored during session, Repositioned         Frequency  Min 2X/week        Progress Toward Goals  OT Goals(current goals can now be found in the care plan section)  Progress towards OT goals: Not progressing toward goals - comment (more lethargic today, increased HR and WOB upon sitting up on EOB)  Acute Rehab OT Goals Patient Stated Goal: unable to state OT Goal Formulation: Patient unable to participate in goal setting Time For Goal Achievement: 10/21/22 Potential to Achieve Goals: Fair  Plan Discharge plan needs to be updated    Co-evaluation    PT/OT/SLP Co-Evaluation/Treatment: Yes (partial) Reason for Co-Treatment: Complexity of the patient's impairments (multi-system involvement);Necessary to address cognition/behavior during functional activity;For patient/therapist safety PT goals addressed during session: Mobility/safety with mobility;Balance OT goals addressed during session: Strengthening/ROM      AM-PAC OT "6 Clicks" Daily Activity     Outcome Measure   Help from another person eating meals?: Total Help from another person taking care of personal grooming?: Total Help from another person toileting, which includes using toliet, bedpan, or urinal?: Total Help from another person bathing (including washing, rinsing, drying)?: Total Help from another person to put on and taking off regular upper body clothing?: Total Help from another person to put on and taking off regular lower body clothing?: Total 6 Click Score: 6    End of Session    OT Visit Diagnosis: Other abnormalities of gait and mobility (R26.89);Muscle weakness (generalized) (M62.81);Other symptoms and signs involving the nervous system (R29.898);Other symptoms and signs involving cognitive function;Hemiplegia and hemiparesis;Pain Hemiplegia - Right/Left:  Left Hemiplegia - dominant/non-dominant: Non-Dominant Hemiplegia - caused by: Nontraumatic intracerebral hemorrhage Pain - Right/Left:  (headache) Pain - part of body:  (headache)   Activity Tolerance Patient limited by lethargy   Patient Left in bed;with call bell/phone within reach;with bed alarm set;with restraints reapplied;with family/visitor present (RUE mitt)   Nurse Communication  (HR up to 158 and increased work of breathing upon sitting up on EOB)        Time: 1045-1106 OT Time Calculation (min): 21 min  Charges: OT Treatments $Self Care/Home Management : 8-22 mins  Lindon Romp OT Acute Rehabilitation Services Office [phone]    Evette Georges 10/11/2022, 11:50 AM

## 2022-10-11 NOTE — Progress Notes (Addendum)
Physical Therapy Treatment Patient Details Name: Mario Smith MRN: [id number] DOB: [birth year] Today's Date: 10/11/2022   History of Present Illness The pt is an 82 yo male presenting 5/1 with L-sided facial droop and slurred speech. Pt given TNK 15:01 5/1, developed headache and was found to have hemorrhagic conversion. PMH includes: COPD, dyspnea on exertion, HTN, graves disease, kidney stones, vertigo, and arthritis.    PT Comments    Patient with limited tolerance to upright today with c/o headache and HR elevation in sitting.  Patient not verbalizing words this session though family in the room report patient talking a lot today.  Family hopeful he can recover with rehab, but currently most appropriate for less intense inpatient rehab (<3 hr/day).  PT will continue to follow in acute setting.    Recommendations for follow up therapy are one component of a multi-disciplinary discharge planning process, led by the attending physician.  Recommendations may be updated based on patient status, additional functional criteria and insurance authorization.  Follow Up Recommendations  Can patient physically be transported by private vehicle: No    Assistance Recommended at Discharge Frequent or constant Supervision/Assistance  Patient can return home with the following Two people to help with walking and/or transfers;Two people to help with bathing/dressing/bathroom;Help with stairs or ramp for entrance;Assist for transportation;Assistance with cooking/housework   Equipment Recommendations  Other (comment) (TBA)    Recommendations for Other Services       Precautions / Restrictions Precautions Precautions: Fall Precaution Comments: SBP <160, watch HR, cortrak, rectal tube Restrictions Weight Bearing Restrictions: No     Mobility  Bed Mobility Overal bed mobility: Needs Assistance Bed Mobility: Supine to Sit, Sit to Supine   Sidelying to sit: Total assist, +2 for safety/equipment, HOB  elevated   Sit to supine: Total assist, +2 for physical assistance   General bed mobility comments: Use of pad under him to A to sit up on EOB and to return to supine    Transfers                        Ambulation/Gait                   Stairs             Wheelchair Mobility    Modified Rankin (Stroke Patients Only) Modified Rankin (Stroke Patients Only) Pre-Morbid Rankin Score: Moderate disability Modified Rankin: Severe disability     Balance Overall balance assessment: Needs assistance Sitting-balance support: No upper extremity supported, Feet supported Sitting balance-Leahy Scale: Zero Sitting balance - Comments: pt not attempting to use UEs nor trunk to A with balance; leaning back and with L side retracted                                    Cognition Arousal/Alertness: Lethargic Behavior During Therapy: Flat affect Overall Cognitive Status: Impaired/Different from baseline Area of Impairment: Following commands, Awareness, Problem solving                       Following Commands: Follows one step commands with increased time Safety/Judgement: Decreased awareness of safety, Decreased awareness of deficits Awareness: Intellectual Problem Solving: Decreased initiation General Comments: Eyes closed 99% of session, not following any commands        Exercises General Exercises - Upper Extremity Shoulder Flexion: PROM, Left, Supine, 5 reps Elbow  Flexion: PROM, Left, Supine, 10 reps Elbow Extension: PROM, Left, Supine, 10 reps Wrist Flexion: PROM, 10 reps, Left, Supine Wrist Extension: PROM, 10 reps, Left, Supine Digit Composite Flexion: PROM, Left, 5 reps, Supine Composite Extension: PROM, Left, 5 reps, Supine General Exercises - Lower Extremity Ankle Circles/Pumps: PROM, 10 reps, Left, Supine Heel Slides: PROM, 5 reps, Left, Supine Hip ABduction/ADduction: PROM, 5 reps, Left, Supine    General Comments  General comments (skin integrity, edema, etc.): HR max 158 in sitting, SBP 159 prior to mobility so RN medicated 143 after mobilizing      Pertinent Vitals/Pain Pain Assessment Pain Assessment: CPOT Facial Expression: Grimacing Body Movements: Restlessness Muscle Tension: Tense, rigid Compliance with ventilator (intubated pts.): N/A Vocalization (extubated pts.): Sighing, moaning CPOT Total: 6 Pain Location: headache Pain Descriptors / Indicators: Grimacing, Headache Pain Intervention(s): Monitored during session, Limited activity within patient's tolerance, Repositioned    Home Living                          Prior Function            PT Goals (current goals can now be found in the care plan section) Progress towards PT goals: Not progressing toward goals - comment    Frequency    Min 3X/week      PT Plan Discharge plan needs to be updated;Frequency needs to be updated    Co-evaluation PT/OT/SLP Co-Evaluation/Treatment: Yes Reason for Co-Treatment: Complexity of the patient's impairments (multi-system involvement);Necessary to address cognition/behavior during functional activity;For patient/therapist safety PT goals addressed during session: Mobility/safety with mobility;Balance OT goals addressed during session: Strengthening/ROM      AM-PAC PT "6 Clicks" Mobility   Outcome Measure  Help needed turning from your back to your side while in a flat bed without using bedrails?: Total Help needed moving from lying on your back to sitting on the side of a flat bed without using bedrails?: Total Help needed moving to and from a bed to a chair (including a wheelchair)?: Total Help needed standing up from a chair using your arms (e.g., wheelchair or bedside chair)?: Total Help needed to walk in hospital room?: Total Help needed climbing 3-5 steps with a railing? : Total 6 Click Score: 6    End of Session   Activity Tolerance: Treatment limited secondary to  medical complications (Comment) Patient left: in bed;with family/visitor present;with nursing/sitter in room Nurse Communication: Other (comment) (HR elevation in sitting and increased RR) PT Visit Diagnosis: Other abnormalities of gait and mobility (R26.89);Hemiplegia and hemiparesis Hemiplegia - Right/Left: Left Hemiplegia - dominant/non-dominant: Non-dominant Hemiplegia - caused by: Cerebral infarction;Nontraumatic intracerebral hemorrhage     Time: 1030-1106 PT Time Calculation (min) (ACUTE ONLY): 36 min  Charges:  $Therapeutic Activity: 8-22 mins                     Sheran Lawless, PT Acute Rehabilitation Services [phone] 10/11/2022    Elray Mcgregor 10/11/2022, 1:00 PM

## 2022-10-11 NOTE — Progress Notes (Signed)
SLP Cancellation Note  Patient Details Name: Mario Smith MRN: [id number] DOB: 28-Feb-1941   Cancelled treatment:       Reason Eval/Treat Not Completed: Fatigue/lethargy limiting ability to participate. Spoke with family at bedside. Pt not ready for PO trials; will watch and wait over next few days before we cautiously resume any PO trials.  D/W RN.  Yuvin Bussiere L. Samson Frederic, MA CCC/SLP Clinical Specialist - Acute Care SLP Acute Rehabilitation Services Office number [phone]    [name] 10/11/2022, 11:42 AM

## 2022-10-11 NOTE — Progress Notes (Addendum)
Patient's BP has been climbing consistently throughout the day. I have had trouble managing the BP goals. This afternoon pt's HR has gone as high as 170 and stays above 120. CCM provider made aware. Stroke MD aware of BP issues and PRN orders were given. Reached back out to attending after continuing to struggle with these vitals. Order to restart precedex gtt. Educated family at the bedside to give patient some space and decrease stimulation for a bit until he calms down and we see progress with his vitals. They were compliant and thankful for our care.    Stephenie Navejas, [place] Scrape, RN

## 2022-10-11 NOTE — Progress Notes (Signed)
NAMEJANELL Smith, MRN:  [id number], DOB:  Apr 28, 1941, LOS: 6 ADMISSION DATE:  2022/10/26, CONSULTATION DATE: 10/07/2022 REFERRING MD: Dr. Roda Shutters, CHIEF COMPLAINT: Fever  History of Present Illness:  82 year old male with hypertension, hyperlipidemia and tuberculosis status posttreatment who was admitted with acute right MCA stroke, received TNK, complicated with hemorrhagic conversion leading to large intraparenchymal hemorrhage on right side.  Patient was started on 3% hypertonic saline.  He started spiking fever with Tmax 102, PCCM was consulted for help evaluation medical management  Patient denies chest pain, shortness of breath, nausea, vomiting, cough and dysuria  Pertinent  Medical History   Past Medical History:  Diagnosis Date   Abdominal pain 10/10/2017   Arthritis    Back pain    Colon polyp 2011   tubular adenoma.  no HG dysplasia.     Constipation    COPD (chronic obstructive pulmonary disease) (HCC)    no per pt   Dehydration 10/10/2017   Dyspnea    with exertion   ED (erectile dysfunction)    Essential hypertension, benign    Gallbladder sludge 10/2017   GERD (gastroesophageal reflux disease)    acute gastritis   Graves disease    History of kidney stones    HTN (hypertension)    Other and unspecified hyperlipidemia    PONV (postoperative nausea and vomiting)    Pt's wife also said that when he had a nerve block in 2015 here, pt had a reaction immediately. 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- finished medication   Vertigo      Significant Hospital Events: Including procedures, antibiotic start and stop dates in addition to other pertinent events   5/6 plan for one way extubation today  Interim History / Subjective:  Patient was extubated yesterday, remains on nasal cannula oxygen Mental status slightly better Spiked fever yesterday with  Tmax 103.1, now is down to 100.5 Required Precedex overnight due to agitation  Objective   Blood pressure (!) 140/91, pulse (!) 112, temperature 99.7 F (37.6 C), temperature source Axillary, resp. rate 18, weight 75.8 kg, SpO2 97 %.        Intake/Output Summary (Last 24 hours) at 10/11/2022 1014 Last data filed at 10/11/2022 0800 Gross per 24 hour  Intake 2216.56 ml  Output 1575 ml  Net 641.56 ml   Filed Weights   Oct 26, 2022 1418 10/09/22 0400 10/11/22 0500  Weight: 78.8 kg 76 kg 75.8 kg   Physical exam: General: Acute on chronically ill-appearing male, lying on the bed HEENT: [place]/AT, eyes anicteric.  Proptosis, dry mucus membranes Neuro: Opens eyes with vocal stimuli, following simple commands, antigravity on right side, neglecting left side with plegia.  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 Complicated with hemorrhagic conversion leading to acute large left parietal intraparenchymal hemorrhage with IVH Cerebral edema with brain compression Induced hypernatremia/hyperchloremia Continue neuro watch Stroke team is following Continue secondary stroke prophylaxis Holding antiplatelet agents Continue statin 3% remain off, serum sodium trended down to 151 Closely monitor serum sodium  Acute hypoxic respiratory failure Aspiration pneumonia Patient  was successfully extubated yesterday Remains on nasal cannula oxygen, titrate with O2 sat goal 92% Continue IV antibiotics He spiked fever with Tmax 103.1 yesterday and now down to 100.5 Cx remain -ve  Hypokalemia Continue to collect lites and monitor  Acute encephalopathy in the setting of ischemic followed by  hemorrhagic stroke Stop Precedex infusion Patient intermittently gets agitated Started on Seroquel twice daily as needed  Paroxysmal A-fib with controlled rate Patient heart rate is not controlled Increase Coreg to 12.5 mg twice daily Holding anticoagulation in the setting of intraparenchymal hemorrhage Continue telemetry monitoring  Hypertension Continue amlodipine, Coreg and irbesartan Intermittently blood pressure goes up when he is agitated  CKD stage IIIa Serum creatinine is at baseline, monitor intake and output Avoid nephrotoxic agents  Anemia and thrombocytopenia of critical illness Monitor H&H and platelet count Platelets stable at 79, monitor for signs of bleeding  Hyperthyroidism Patient's TSH is 1.2 with normal free T3 and T4 Continue methimazole  Best Practice (right click and "Reselect all SmartList Selections" daily)   Diet/type: Tube feeds DVT prophylaxis: Lovenox GI prophylaxis: PPI Lines: Central line Foley:  Yes, and it is still needed Code Status:  DNR, but short-term intubation Last date of multidisciplinary goals of care discussion: 5/7: Patient's family was updated at bedside   Labs   CBC: Recent Labs  Lab 10/21/2022 1402 10/16/2022 1409 10/07/22 0545 10/08/22 1540 10/09/22 0125 10/09/22 0626 10/10/22 0503 10/11/22 0425  WBC 6.6   < > 8.7  --  8.9 8.6 7.6 9.1  NEUTROABS 4.5  --  6.6  --   --   --   --   --   HGB 15.1   < > 12.3* 10.2* 11.8* 13.2 12.7* 12.5*  HCT 45.3   < > 38.0* 30.0* 35.8* 41.6 38.8* 38.4*  MCV 93.2   < > 96.4  --  95.2 96.3 95.8 96.0  PLT 131*   < > 102*  --  83* 78* 76* 79*   < > = values in this interval not displayed.    Basic Metabolic Panel: Recent Labs  Lab 10/06/22 0617 10/06/22 1243 10/07/22 0545 10/07/22 1240 10/08/22 1540 10/08/22 1747 10/09/22 0027 10/09/22 0626 10/09/22 1302 10/09/22 1719 10/09/22 2338 10/10/22 0503 10/11/22 0425  NA 144   < > 154*   < > 155*   < > 153* 154* 156* 158* 156*  158* 151*  K  --   --  3.8  --  3.3*  --  4.4 3.2*  --   --   --  3.6 3.4*  CL  --   --  122*  --   --   --  125* 125*  --   --   --  127* 121*  CO2  --   --  21*  --   --   --  20* 22  --   --   --  25 24  GLUCOSE  --   --  125*  --   --   --  142* 212*  --   --   --  172* 133*  BUN  --   --  13  --   --   --  30* 27*  --   --   --  22 18  CREATININE  --   --  1.30*  --   --   --  1.34* 1.25*  --   --   --  1.30* 1.14  CALCIUM  --   --  8.8*  --   --   --  8.6* 8.5*  --   --   --  8.4* 8.3*  MG 2.1  --   --   --   --   --   --   --   --   --   --   --  2.3   < > = values in this interval not displayed.   GFR: Estimated Creatinine Clearance: 46.7 mL/min (by C-G formula based on SCr of 1.14 mg/dL). Recent Labs  Lab 10/07/22 0545 10/09/22 0125 10/09/22 0626 10/10/22 0503 10/11/22 0425  PROCALCITON <0.10  --   --   --   --   WBC 8.7 8.9 8.6 7.6 9.1  LATICACIDVEN 1.1  --   --   --   --     Liver Function Tests: Recent Labs  Lab 15-Oct-2022 1402 10/07/22 0545  AST 20 17  ALT 16 14  ALKPHOS 63 38  BILITOT 0.9 0.9  PROT 6.9 6.0*  ALBUMIN 3.9 3.2*   Recent Labs  Lab 10/07/22 0545  LIPASE 38   Recent Labs  Lab 10/11/22 0425  AMMONIA 18    ABG    Component Value Date/Time   PHART 7.398 10/08/2022 1540   PCO2ART 41.1 10/08/2022 1540   PO2ART 399 (H) 10/08/2022 1540   HCO3 25.4 10/08/2022 1540   TCO2 27 10/08/2022 1540   O2SAT 100 10/08/2022 1540     Coagulation Profile: Recent Labs  Lab 2022-10-15 1402 10/15/2022 1817  INR 0.9 1.0    Cardiac Enzymes: No results for input(s): "CKTOTAL", "CKMB", "CKMBINDEX", "TROPONINI" in the last 168 hours.  HbA1C: Hgb A1c MFr Bld  Date/Time Value Ref Range Status  10/06/2022 06:17 AM 5.4 4.8 - 5.6 % Final    Comment:    (NOTE) Pre diabetes:          5.7%-6.4%  Diabetes:              >6.4%  Glycemic control for   <7.0% adults with diabetes     CBG: Recent Labs  Lab 10/10/22 1529 10/10/22 2004 10/10/22 2335  10/11/22 0341 10/11/22 0802  GLUCAP 91 127* 153* 128* 130*    This patient is critically ill with multiple organ system failure which requires frequent high complexity decision making, assessment, support, evaluation, and titration of therapies. This was completed through the application of advanced monitoring technologies and extensive interpretation of multiple databases.  During this encounter critical care time was devoted to patient care services described in this note for 32 minutes.    Cheri Fowler, MD [facility] See Amion for pager If no response to pager, please call [phone] until 7pm After 7pm, Please call E-link [phone]

## 2022-10-11 NOTE — Progress Notes (Signed)
[facility] ADULT ICU REPLACEMENT PROTOCOL   The patient does apply for the [facility] Adult ICU Electrolyte Replacment Protocol based on the criteria listed below:   1.Exclusion criteria: TCTS, ECMO, Dialysis, and Myasthenia Gravis patients 2. Is GFR >/= 30 ml/min? Yes.    Patient's GFR today is >60 3. Is SCr </= 2? Yes.   Patient's SCr is 1.14 mg/dL 4. Did SCr increase >/= 0.5 in 24 hours? No. 5.Pt's weight >40kg  Yes.   6. Abnormal electrolyte(s): K+ 3.4   7. Electrolytes replaced per protocol 8.  Call MD STAT for K+ </= 2.5, Phos </= 1, or Mag </= 1 Physician:  Larinda Buttery [facility] 10/11/2022 5:05 AM

## 2022-10-11 NOTE — Progress Notes (Addendum)
STROKE TEAM PROGRESS NOTE   INTERVAL HISTORY  Patient evaluated in room with wife at bedside. Doing well after extubation yesterday. He is more awake and alert today, interactive during rounds. Oriented to person, place, and year. Strong, deep voice on exam, hopeful that he may recover swallowing ability in the upcoming days and thus may be able to avoid PEG tube placement. SLP is continuing to work with him. Has a cortrak in place at this time for ongoing TFs. He is on day 4 of unasyn therapy, still spiking intermittent fevers with Tmax of 103.28F since yesterday. Sodium is continuing to downtrend, now at 151. Mild hypokalemia repleted today.    Vitals:   10/11/22 0930 10/11/22 1000 10/11/22 1100 10/11/22 1200  BP: (!) 140/91 (!) 166/87 136/73 (!) 149/68  Pulse: (!) 112 (!) 101 89 88  Resp: 18 (!) 23 (!) 26 (!) 23  Temp:    (!) 102.4 F (39.1 C)  TempSrc:    Axillary  SpO2: 97% 97% 95% 92%  Weight:       CBC:  Recent Labs  Lab 10/26/22 1402 10-26-22 1409 10/07/22 0545 10/08/22 1540 10/10/22 0503 10/11/22 0425  WBC 6.6   < > 8.7   < > 7.6 9.1  NEUTROABS 4.5  --  6.6  --   --   --   HGB 15.1   < > 12.3*   < > 12.7* 12.5*  HCT 45.3   < > 38.0*   < > 38.8* 38.4*  MCV 93.2   < > 96.4   < > 95.8 96.0  PLT 131*   < > 102*   < > 76* 79*   < > = values in this interval not displayed.   Basic Metabolic Panel:  Recent Labs  Lab 10/06/22 0617 10/06/22 1243 10/10/22 0503 10/11/22 0425  NA 144   < > 158* 151*  K  --    < > 3.6 3.4*  CL  --    < > 127* 121*  CO2  --    < > 25 24  GLUCOSE  --    < > 172* 133*  BUN  --    < > 22 18  CREATININE  --    < > 1.30* 1.14  CALCIUM  --    < > 8.4* 8.3*  MG 2.1  --   --  2.3   < > = values in this interval not displayed.   Lipid Panel:  Recent Labs  Lab 10/06/22 0617  CHOL 130  TRIG 64  HDL 38*  CHOLHDL 3.4  VLDL 13  LDLCALC 79   HgbA1c:  Recent Labs  Lab 10/06/22 0617  HGBA1C 5.4   Urine Drug Screen:  Recent Labs  Lab  10/26/22 1553  LABOPIA NONE DETECTED  COCAINSCRNUR NONE DETECTED  LABBENZ NONE DETECTED  AMPHETMU NONE DETECTED  THCU NONE DETECTED  LABBARB NONE DETECTED    Alcohol Level  Recent Labs  Lab 26-Oct-2022 1402  ETH <10   IMAGING past 24 hours DG Abd Portable 1V  Result Date: 10/10/2022 CLINICAL DATA:  Feeding tube placement. EXAM: PORTABLE ABDOMEN - 1 VIEW COMPARISON:  Radiograph 10/08/2022 FINDINGS: Previous enteric tube is been removed. There is a new weighted enteric tube with tip in the right upper quadrant, in the region of the distal stomach. Partial clearing of enteric contrast within the colon from prior exam. IMPRESSION: Weighted enteric tube with tip in the right upper quadrant, in the region of  the distal stomach. Electronically Signed   By: Narda Rutherford M.D.   On: 10/10/2022 15:58    PHYSICAL EXAM  Temp:  [98.9 F (37.2 C)-103.1 F (39.5 C)] 102.4 F (39.1 C) (05/07 1200) Pulse Rate:  [58-162] 88 (05/07 1200) Resp:  [18-28] 23 (05/07 1200) BP: (111-166)/(57-101) 149/68 (05/07 1200) SpO2:  [92 %-100 %] 92 % (05/07 1200) Weight:  [75.8 kg] 75.8 kg (05/07 0500)  Physical Exam: General: Pleasant elderly Caucasian male, laying in bed, NAD. Psych: calm and cooperative with exam. HEENT: [place]/AT. Cortrak in place. CV: irregularly irregular rhythm, no m/r/g. Pullm: coarse breath sounds bilaterally, no wheezing or rhonchi noted. Abdomen: soft, ND, NT, normal bowel sounds.  Neuro: Mental status: awake and alert, oriented x3. Following commands.  Speech is dysarthric. Cranial Nerves:  II: PERRL. III, IV, VI: bilateral proptosis, slightly improved from yesterday. R gaze preference.  V: facial sensation symmetric. VII: L facial droop present VIII: hearing intact to voice. XI: shoulder shrug diminished on left side  Motor: Moves RUE and RLE to command with good antigravity strength. Coarse tremor of RUE. L hemiplegia persists but mildly withdraws to pain.. Tone decreased on  left side.  Sensation: intact bilaterally.  Coordination: unable to accurate assess.  Gait: deferred.    ASSESSMENT/PLAN Mario Smith is a 82 y.o. male with history of hypertension, hyperlipidemia, thrombocytopenia, Graves' disease with exophthalmos, Mnire's disease, hard of hearing at baseline uses hearing aids, cervical spine fusion, latent tuberculosis s/p treatment, COPD  presenting with ischemic stroke and secondary R cortical ICH secondary to TNK.   Stroke: right MCA infarct status post TNK, complicated by large parenchymal hemorrhagic infarct with IVH, stroke embolic pattern, likely due to new diagnosed A-fib Code Stroke:CT head with subtle loss of gray-white differentiation in R frontal lobe. Chronic small vessel disease. Atrophy. Remote R parietal cortical infarct. ASPECTS 9.    CTA head & neck: No large vessel occlusion or proximal hemodynamically significant stenosis CT repeat for headache showed large parenchymal hematoma centered in the right frontal lobe, SAH seen in the right sylvian fissure.  Mass effect on right lateral ventricle CT repeat slight interval increase of right parenchymal hematoma and ICH, interval extension right > left IVH into lateral ventricles, enlargement of temporal and occipital horns, concerning for hydrocephalus.  Right posterior frontal infarct. CT repeat 5/2 stable parenchymal hematoma and IVH. CTH repeat 5/4: No evidence of new or additional bleeding.  IPH in the right temporal/parietal region as before, with slight clot evolution as expected.  Amount of IVH and SAH blood is not increased.  Ventricular size is not increased. MRI brain: Large intraparenchymal hematoma in the right cerebral hemisphere with intraventricular and subarachnoid extension of hemorrhage, similar to prior CT. Subacute infarct in the posterior right frontal lobe, corresponding to area of hypodensity seen on prior CT. Small remote infarcts in the bilateral cerebellar  hemispheres.   2D Echo: EF 60-65%  LDL 79 HgbA1c 5.4 VTE prophylaxis - lovenox  aspirin 81 mg daily prior to admission, now on No antithrombotic.  Therapy recommendations: pending Disposition:  Pending  Cerebral edema CT showed large ICH with mass effect on the right ventricle but no significant midline shift On 3% saline @75  -> 50 ml/hr-> 25 mL/h --> off 5/6 S/p 3% bolus x 1 on 5/2 Na monitoring q6h Na 140->144->146 -> 154 ->153-> 155 -> 154 > 156 > 158 > 151  IVH, risk of hydrocephalus CT head concerning for developing hydrocephalus Repeat CT IVH stable MRI  showed stable IVH NSGY consult if patient declines, and if the family indicates this would be within goals of care at the time   Afib, new diagnosis Telemetry and EKG captured A-fib Possible cause of stroke this time Hold off [facility] now due to hemorrhage May consider [facility] once hemorrhage resolves and neuro stable  Hypertension Home meds:  Telmisartan 20mg , Carvedilol 6.25mg  BID On norvasc, coreg, and irbesartan Aggressive BP control, goal SBP < 160 Long-term BP goal normotensive  Hyperlipidemia Home meds:  Crestor 10mg  daily LDL 79, goal < 70 Restarted crestor at 20mg  daily Continue statin at discharge  Dysphagia with concern for ongoing aspiration with PO intake Passed swallow initially and was recommended dysphagia 1 and honey thick liquid NPO decision with cortrak placement for feeds and meds Continue IV Unasyn for aspiration pneumonia Hopeful swallowing ability will improve over the next few days and can possibly avoid PEG tube placement  Hypoxic respiratory failure requiring intubation  Likely secondary to aspiration pneumonia  Continue IV antibiotics NPO cortrak for medications  SLP prior to PO intake   Other Stroke Risk Factors Advanced Age >/= 35   Other Active Problems Graves Dz status post left eye surgery causing ptosis, methimazole 5mg  twice weekly Right hand resting tremor, concerning for  Parkinson disease.  Patient has no neurology following PTA.   Hospital day # 6   Merrilyn Puma, MD [facility], PGY-3 10/11/2022, 1:09 PM  I have personally obtained history,examined this patient, reviewed notes, independently viewed imaging studies, participated in medical decision making and plan of care.ROS completed by me personally and pertinent positives fully documented  I have made any additions or clarifications directly to the above note. Agree with note above.  Continue mobilization out of bed.  Continue ongoing physical occupational therapy.  Transfer out of ICU to stepdown bed.  Speech therapy for swallow eval.  Long discussion with the patient's wife and daughter at the bedside and answered questions.  Discussed with Dr. Merrily Pew.This patient is critically ill and at significant risk of neurological worsening, death and care requires constant monitoring of vital signs, hemodynamics,respiratory and cardiac monitoring, extensive review of multiple databases, frequent neurological assessment, discussion with family, other specialists and medical decision making of high complexity.I have made any additions or clarifications directly to the above note.This critical care time does not reflect procedure time, or teaching time or supervisory time of PA/NP/Med Resident etc but could involve care discussion time.  I spent 30 minutes of neurocritical care time  in the care of  this patient.      Delia Heady, MD Medical Director [facility] St[facility] Pager: [phone] 10/11/2022 2:40 PM  To contact Stroke Continuity provider, please refer to [email or web address]. After hours, contact General Neurology

## 2022-10-12 ENCOUNTER — Inpatient Hospital Stay (HOSPITAL_COMMUNITY): Payer: No Typology Code available for payment source

## 2022-10-12 DIAGNOSIS — I639 Cerebral infarction, unspecified: Secondary | ICD-10-CM | POA: Diagnosis not present

## 2022-10-12 DIAGNOSIS — J9601 Acute respiratory failure with hypoxia: Secondary | ICD-10-CM | POA: Diagnosis not present

## 2022-10-12 DIAGNOSIS — J690 Pneumonitis due to inhalation of food and vomit: Secondary | ICD-10-CM

## 2022-10-12 DIAGNOSIS — R0600 Dyspnea, unspecified: Secondary | ICD-10-CM | POA: Diagnosis not present

## 2022-10-12 DIAGNOSIS — I611 Nontraumatic intracerebral hemorrhage in hemisphere, cortical: Secondary | ICD-10-CM | POA: Diagnosis not present

## 2022-10-12 LAB — CBC WITH DIFFERENTIAL/PLATELET
Abs Immature Granulocytes: 0.08 10*3/uL — ABNORMAL HIGH (ref 0.00–0.07)
Basophils Absolute: 0 10*3/uL (ref 0.0–0.1)
Basophils Relative: 0 %
Eosinophils Absolute: 0 10*3/uL (ref 0.0–0.5)
Eosinophils Relative: 0 %
HCT: 38.3 % — ABNORMAL LOW (ref 39.0–52.0)
Hemoglobin: 12.5 g/dL — ABNORMAL LOW (ref 13.0–17.0)
Immature Granulocytes: 1 %
Lymphocytes Relative: 11 %
Lymphs Abs: 1.2 10*3/uL (ref 0.7–4.0)
MCH: 31.1 pg (ref 26.0–34.0)
MCHC: 32.6 g/dL (ref 30.0–36.0)
MCV: 95.3 fL (ref 80.0–100.0)
Monocytes Absolute: 1.2 10*3/uL — ABNORMAL HIGH (ref 0.1–1.0)
Monocytes Relative: 11 %
Neutro Abs: 8.7 10*3/uL — ABNORMAL HIGH (ref 1.7–7.7)
Neutrophils Relative %: 77 %
Platelets: 83 10*3/uL — ABNORMAL LOW (ref 150–400)
RBC: 4.02 MIL/uL — ABNORMAL LOW (ref 4.22–5.81)
RDW: 14.7 % (ref 11.5–15.5)
WBC: 11.2 10*3/uL — ABNORMAL HIGH (ref 4.0–10.5)
nRBC: 0 % (ref 0.0–0.2)

## 2022-10-12 LAB — BASIC METABOLIC PANEL
Anion gap: 9 (ref 5–15)
BUN: 23 mg/dL (ref 8–23)
CO2: 25 mmol/L (ref 22–32)
Calcium: 8 mg/dL — ABNORMAL LOW (ref 8.9–10.3)
Chloride: 116 mmol/L — ABNORMAL HIGH (ref 98–111)
Creatinine, Ser: 1.41 mg/dL — ABNORMAL HIGH (ref 0.61–1.24)
GFR, Estimated: 50 mL/min — ABNORMAL LOW (ref >60)
Glucose, Bld: 139 mg/dL — ABNORMAL HIGH (ref 70–99)
Potassium: 3.4 mmol/L — ABNORMAL LOW (ref 3.5–5.1)
Sodium: 150 mmol/L — ABNORMAL HIGH (ref 135–145)

## 2022-10-12 LAB — CULTURE, BLOOD (ROUTINE X 2)

## 2022-10-12 LAB — GLUCOSE, CAPILLARY
Glucose-Capillary: 119 mg/dL — ABNORMAL HIGH (ref 70–99)
Glucose-Capillary: 145 mg/dL — ABNORMAL HIGH (ref 70–99)
Glucose-Capillary: 153 mg/dL — ABNORMAL HIGH (ref 70–99)

## 2022-10-12 MED ORDER — FENTANYL CITRATE PF 50 MCG/ML IJ SOSY
25.0000 ug | PREFILLED_SYRINGE | INTRAMUSCULAR | Status: DC | PRN
  Administered 2022-10-12: 100 ug via INTRAVENOUS
  Filled 2022-10-12: qty 2

## 2022-10-12 MED ORDER — POTASSIUM CHLORIDE 20 MEQ PO PACK: 40.0000 meq | PACK | Freq: Once | ORAL | Status: DC

## 2022-10-12 MED ORDER — FENTANYL BOLUS VIA INFUSION
100.0000 ug | INTRAVENOUS | Status: DC | PRN
  Administered 2022-10-12 (×3): 100 ug via INTRAVENOUS

## 2022-10-12 MED ORDER — MIDAZOLAM-SODIUM CHLORIDE 100-0.9 MG/100ML-% IV SOLN
0.0000 mg/h | INTRAVENOUS | Status: DC
  Administered 2022-10-12: 1 mg/h via INTRAVENOUS
  Filled 2022-10-12: qty 100

## 2022-10-12 MED ORDER — ACETAMINOPHEN 10 MG/ML IV SOLN
1000.0000 mg | Freq: Once | INTRAVENOUS | Status: AC
  Administered 2022-10-12: 1000 mg via INTRAVENOUS
  Filled 2022-10-12: qty 100

## 2022-10-12 MED ORDER — FENTANYL 2500MCG IN NS 250ML (10MCG/ML) PREMIX INFUSION
0.0000 ug/h | INTRAVENOUS | Status: DC
  Administered 2022-10-12: 50 ug/h via INTRAVENOUS
  Filled 2022-10-12: qty 250

## 2022-10-12 MED ORDER — POTASSIUM CHLORIDE 20 MEQ PO PACK
40.0000 meq | PACK | Freq: Once | ORAL | Status: DC
  Filled 2022-10-12: qty 2

## 2022-10-12 MED ORDER — MIDAZOLAM BOLUS VIA INFUSION (WITHDRAWAL LIFE SUSTAINING TX): 2.0000 mg | INTRAVENOUS | Status: DC | PRN

## 2022-10-12 MED ORDER — GLYCOPYRROLATE 0.2 MG/ML IJ SOLN: 0.2000 mg | INTRAMUSCULAR | Status: DC | PRN

## 2022-10-12 MED ORDER — MIDAZOLAM HCL 2 MG/2ML IJ SOLN
2.0000 mg | INTRAMUSCULAR | Status: DC | PRN
  Administered 2022-10-12: 4 mg via INTRAVENOUS
  Filled 2022-10-12: qty 4

## 2022-10-12 MED ORDER — POLYVINYL ALCOHOL 1.4 % OP SOLN: 1.0000 [drp] | Freq: Four times a day (QID) | OPHTHALMIC | Status: DC | PRN

## 2022-10-12 MED ORDER — GLYCOPYRROLATE 1 MG PO TABS: 1.0000 mg | ORAL_TABLET | ORAL | Status: DC | PRN

## 2022-10-12 MED ORDER — FENTANYL CITRATE PF 50 MCG/ML IJ SOSY
25.0000 ug | PREFILLED_SYRINGE | INTRAMUSCULAR | Status: DC | PRN
  Administered 2022-10-12 (×2): 25 ug via INTRAVENOUS
  Filled 2022-10-12 (×2): qty 1

## 2022-10-12 MED ORDER — SODIUM CHLORIDE 0.9 % IV SOLN: INTRAVENOUS | Status: DC

## 2022-10-12 MED ORDER — FUROSEMIDE 10 MG/ML IJ SOLN
40.0000 mg | Freq: Once | INTRAMUSCULAR | Status: AC
  Administered 2022-10-12: 40 mg via INTRAVENOUS
  Filled 2022-10-12: qty 4

## 2022-10-21 ENCOUNTER — Ambulatory Visit: Payer: No Typology Code available for payment source

## 2022-10-27 ENCOUNTER — Telehealth: Payer: Self-pay | Admitting: Neurology

## 2022-10-27 NOTE — Telephone Encounter (Signed)
Mario Smith from [facility] called stating that the deceased death certificate was sent to be signed on the 13th and has not been signed yet. Please log on to DAVE to sign.

## 2022-10-28 ENCOUNTER — Telehealth: Payer: Self-pay | Admitting: Neurology

## 2022-10-28 NOTE — Telephone Encounter (Signed)
Mario Smith @ [facility]  is calling [id number] Certificate needing signature since 5-8.  DAVE is still the means in which Dr Pearlean Brownie needs to complete this, if Mario Smith needs a call he can be reached at [phone]

## 2022-11-01 NOTE — Telephone Encounter (Signed)
error 

## 2022-11-05 NOTE — Progress Notes (Addendum)
Mario Smith, MRN:  [id number], DOB:  April 22, 1941, LOS: 7 ADMISSION DATE:  2022-10-25, CONSULTATION DATE: 10/07/2022 REFERRING MD: Dr. Roda Shutters, CHIEF COMPLAINT: Fever  History of Present Illness:  82 year old male with hypertension, hyperlipidemia and tuberculosis status posttreatment who was admitted with acute right MCA stroke, received TNK, complicated with hemorrhagic conversion leading to large intraparenchymal hemorrhage on right side.  Patient was started on 3% hypertonic saline.  He started spiking fever with Tmax 102, PCCM was consulted for help evaluation medical management  Patient denies chest pain, shortness of breath, nausea, vomiting, cough and dysuria  Pertinent  Medical History   Past Medical History:  Diagnosis Date   Abdominal pain 10/10/2017   Arthritis    Back pain    Colon polyp 2011   tubular adenoma.  no HG dysplasia.     Constipation    COPD (chronic obstructive pulmonary disease) (HCC)    no per pt   Dehydration 10/10/2017   Dyspnea    with exertion   ED (erectile dysfunction)    Essential hypertension, benign    Gallbladder sludge 10/2017   GERD (gastroesophageal reflux disease)    acute gastritis   Graves disease    History of kidney stones    HTN (hypertension)    Other and unspecified hyperlipidemia    PONV (postoperative nausea and vomiting)    Pt's wife also said that when he had a nerve block in 2015 here, pt had a reaction immediately. 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- finished medication   Vertigo      Significant Hospital Events: Including procedures, antibiotic start and stop dates in addition to other pertinent events   5/6 plan for one way extubation today  Interim History / Subjective:  Patient was extubated 5/6 Spiked fever yesterday with Tmax 103.4 Yesterday morning he was awake and able to follow commands,  but he has lost this ability.  Code stroke called overnight for this reason, CT improving. WOB worsened overnight. Given fentanyl without benefit.      Decreased density of IPH with small amount of overlying SAH. Unchanged layering blood in the ventricles. Near resolution of left sided SAH.   Objective   Blood pressure (!) 178/79, pulse 84, temperature 99.1 F (37.3 C), temperature source Axillary, resp. rate (!) 30, weight 75.4 kg, SpO2 97 %.        Intake/Output Summary (Last 24 hours) at 11/01/2022 0721 Last data filed at 11/01/2022 0400 Gross per 24 hour  Intake 1566.69 ml  Output 970 ml  Net 596.69 ml    Filed Weights   10/09/22 0400 10/11/22 0500 11/03/2022 0500  Weight: 76 kg 75.8 kg 75.4 kg   Physical exam:  General: elderly male in respiratory distress HEENT: Pupils pinpoint, no JVD.  Neuro: Minimally responsive. Not following commands.  Chest: Tachypneic, coarse crackles bilateral breath sounds. Accessory muscle use.  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 Trend  Acute hypoxic respiratory failure Aspiration pneumonia Extubated 5/6, now with significant distress.  Remains on nasal cannula oxygen, titrate with O2 sat goal 92% Continue IV Unasyn He spiked fever with Tmax 103.4 Blood cx pending  Hypokalemia Continue to collect lytes and monitor Replaced  today  Acute encephalopathy in the setting of ischemic followed by hemorrhagic stroke Patient intermittently gets agitated Seroquel PRN Neurochecks  Paroxysmal A-fib with controlled rate Patient heart rate is not controlled. Likely driven by fever and respiratory distress at this point Increase Coreg to 12.5 mg twice daily Holding anticoagulation in the setting of intraparenchymal hemorrhage Continue telemetry monitoring  Hypertension Continue amlodipine, Coreg and irbesartan Intermittently blood pressure goes up when he is agitated PRN labetalol, hydral Keep SBP < 160 mmHg  CKD stage IIIa Serum creatinine is at baseline, monitor intake and output Avoid nephrotoxic agents  Anemia and thrombocytopenia of critical illness Monitor H&H and platelet count Platelets stable at 79, monitor for signs of bleeding  Hyperthyroidism Patient's TSH is 1.2 with normal free T3 and T4 Continue methimazole  Best Practice (right click and "Reselect all SmartList Selections" daily)   Diet/type: Tube feeds DVT prophylaxis: Lovenox GI prophylaxis: PPI Lines: Central line Foley:  Yes, and it is still needed Code Status:  DNR, but short-term intubation Last date of multidisciplinary goals of care discussion: 5/7: Patient's family was updated at bedside   Labs   CBC: Recent Labs  Lab 10/07/2022 1402 10/23/2022 1409 10/07/22 0545 10/08/22 1540 10/09/22 0125 10/09/22 0626 10/10/22 0503 10/11/22 0425 11/03/2022 0403  WBC 6.6   < > 8.7  --  8.9 8.6 7.6 9.1 11.2*  NEUTROABS 4.5  --  6.6  --   --   --   --   --  8.7*  HGB 15.1   < > 12.3*   < > 11.8* 13.2 12.7* 12.5* 12.5*  HCT 45.3   < > 38.0*   < > 35.8* 41.6 38.8* 38.4* 38.3*  MCV 93.2   < > 96.4  --  95.2 96.3 95.8 96.0 95.3  PLT 131*   < > 102*  --  83* 78* 76* 79* 83*   < > = values in this interval not displayed.     Basic Metabolic Panel: Recent Labs  Lab 10/06/22 0617 10/06/22 1243 10/09/22 0027 10/09/22 0626 10/09/22 1302  10/09/22 1719 10/09/22 2338 10/10/22 0503 10/11/22 0425 10/16/2022 0403  NA 144   < > 153* 154*   < > 158* 156* 158* 151* 150*  K  --    < > 4.4 3.2*  --   --   --  3.6 3.4* 3.4*  CL  --    < > 125* 125*  --   --   --  127* 121* 116*  CO2  --    < > 20* 22  --   --   --  25 24 25   GLUCOSE  --    < > 142* 212*  --   --   --  172* 133* 139*  BUN  --    < > 30* 27*  --   --   --  22 18 23   CREATININE  --    < > 1.34* 1.25*  --   --   --  1.30* 1.14 1.41*  CALCIUM  --    < > 8.6* 8.5*  --   --   --  8.4* 8.3* 8.0*  MG 2.1  --   --   --   --   --   --   --  2.3  --    < > = values in this interval not displayed.    GFR: Estimated Creatinine Clearance: 37.8 mL/min (A) (by C-G formula based on SCr of 1.41 mg/dL (H)). Recent Labs  Lab 10/07/22 0545 10/09/22 0125 10/09/22 0626 10/10/22 0503 10/11/22 0425 10/23/2022 0403  PROCALCITON <0.10  --   --   --   --   --   WBC 8.7   < > 8.6 7.6 9.1 11.2*  LATICACIDVEN 1.1  --   --   --   --   --    < > = values in this interval not displayed.     Liver Function Tests: Recent Labs  Lab 2022-11-03 1402 10/07/22 0545  AST 20 17  ALT 16 14  ALKPHOS 63 38  BILITOT 0.9 0.9  PROT 6.9 6.0*  ALBUMIN 3.9 3.2*    Recent Labs  Lab 10/07/22 0545  LIPASE 38    Recent Labs  Lab 10/11/22 0425  AMMONIA 18     ABG    Component Value Date/Time   PHART 7.398 10/08/2022 1540   PCO2ART 41.1 10/08/2022 1540   PO2ART 399 (H) 10/08/2022 1540   HCO3 25.4 10/08/2022 1540   TCO2 27 10/08/2022 1540   O2SAT 100 10/08/2022 1540     Coagulation Profile: Recent Labs  Lab 11/03/2022 1402 03-Nov-2022 1817  INR 0.9 1.0     Cardiac Enzymes: No results for input(s): "CKTOTAL", "CKMB", "CKMBINDEX", "TROPONINI" in the last 168 hours.  HbA1C: Hgb A1c MFr Bld  Date/Time Value Ref Range Status  10/06/2022 06:17 AM 5.4 4.8 - 5.6 % Final    Comment:    (NOTE) Pre diabetes:          5.7%-6.4%  Diabetes:              >6.4%  Glycemic control for    <7.0% adults with diabetes     CBG: Recent Labs  Lab 10/11/22 1132 10/11/22 1524 10/11/22 2000 10/11/22 2357 10/09/2022 0352  GLUCAP 104* 109* 121* 145* 153*     Joneen Roach, AGACNP-BC [facility]  See Amion for personal pager PCCM on call pager [phone] until 7pm. Please call Elink 7p-7a. [phone]  10/30/2022 7:50 AM

## 2022-11-05 NOTE — Progress Notes (Signed)
[facility] ADULT ICU REPLACEMENT PROTOCOL   The patient does apply for the [facility] Adult ICU Electrolyte Replacment Protocol based on the criteria listed below:   1.Exclusion criteria: TCTS, ECMO, Dialysis, and Myasthenia Gravis patients 2. Is GFR >/= 30 ml/min? Yes.    Patient's GFR today is 50 3. Is SCr </= 2? Yes.   Patient's SCr is 1.41 mg/dL 4. Did SCr increase >/= 0.5 in 24 hours? No. 5.Pt's weight >40kg  Yes.   6. Abnormal electrolyte(s): K+ 3.4 7. Electrolytes replaced per protocol 8.  Call MD STAT for K+ </= 2.5, Phos </= 1, or Mag </= 1 Physician:  Larinda Buttery [facility]  5:16 AM

## 2022-11-05 NOTE — Progress Notes (Signed)
PT Cancellation Note  Patient Details Name: BRONSEN TAJIMA MRN: [id number] DOB: [birth year]   Cancelled Treatment:    Reason Eval/Treat Not Completed: Other (comment); noted plans for comfort measures, will sign off.    Elray Mcgregor 10/30/2022, 12:33 PM Sheran Lawless, PT Acute Rehabilitation Services Office:[phone] 10/30/2022

## 2022-11-05 NOTE — Progress Notes (Addendum)
STROKE TEAM PROGRESS NOTE   INTERVAL HISTORY  Patient evaluated in room with family at bedside. Clinically worsened overnight, lost ability to follow commands and began having increased work of breathing. Repeat CT head overnight was stable compared to prior. Ultimately, PCCM had a long GOC conversation with family and they have elected to proceed with comfort care measures which I believe is reasonable given his overall clinical condition and poor prognosis.    Vitals:    0830  0837  0858  0900  BP: (!) 162/67   (!) 142/64  Pulse: 89 87  93  Resp: (!) 31 (!) 25  (!) 40  Temp:   (!) 100.5 F (38.1 C)   TempSrc:   Axillary   SpO2: 92% 99%  92%  Weight:       CBC:  Recent Labs  Lab 10/07/22 0545 10/08/22 1540 10/11/22 0425  0403  WBC 8.7   < > 9.1 11.2*  NEUTROABS 6.6  --   --  8.7*  HGB 12.3*   < > 12.5* 12.5*  HCT 38.0*   < > 38.4* 38.3*  MCV 96.4   < > 96.0 95.3  PLT 102*   < > 79* 83*   < > = values in this interval not displayed.   Basic Metabolic Panel:  Recent Labs  Lab 10/06/22 0617 10/06/22 1243 10/11/22 0425  0403  NA 144   < > 151* 150*  K  --    < > 3.4* 3.4*  CL  --    < > 121* 116*  CO2  --    < > 24 25  GLUCOSE  --    < > 133* 139*  BUN  --    < > 18 23  CREATININE  --    < > 1.14 1.41*  CALCIUM  --    < > 8.3* 8.0*  MG 2.1  --  2.3  --    < > = values in this interval not displayed.   Lipid Panel:  Recent Labs  Lab 10/06/22 0617  CHOL 130  TRIG 64  HDL 38*  CHOLHDL 3.4  VLDL 13  LDLCALC 79   HgbA1c:  Recent Labs  Lab 10/06/22 0617  HGBA1C 5.4   Urine Drug Screen:  Recent Labs  Lab 10/31/2022 1553  LABOPIA NONE DETECTED  COCAINSCRNUR NONE DETECTED  LABBENZ NONE DETECTED  AMPHETMU NONE DETECTED  THCU NONE DETECTED  LABBARB NONE DETECTED    Alcohol Level  Recent Labs  Lab 10/13/2022 1402  ETH <10   IMAGING past 24 hours DG Chest Port 1 View  Result Date:  CLINICAL  DATA:  Dyspnea. EXAM: PORTABLE CHEST 1 VIEW COMPARISON:  10/08/2022 and 07/03/2020 FINDINGS: Endotracheal tube has been removed. Nasogastric tube has been removed and the patient now has a feeding tube that extends into the abdomen but the tip is beyond the image. Right arm PICC line tip is in the SVC region. Persistent densities at the left lung base with obscuration of left hemidiaphragm. Right lung is clear. Heart size is stable and within normal limits. Surgical plate in lower cervical spine. IMPRESSION: 1. Persistent densities in the mid and lower left lung. Findings are concerning for consolidation/atelectasis and probably pleural fluid. Findings could represent underlying infection. 2. Support apparatuses as described. Electronically Signed   By: Richarda Overlie M.D.   On:  08:25   CT HEAD WO CONTRAST ( )  Result Date:  CLINICAL DATA:  Altered mental status  EXAM: CT HEAD WITHOUT CONTRAST TECHNIQUE: Contiguous axial images were obtained from the base of the skull through the vertex without intravenous contrast. RADIATION DOSE REDUCTION: This exam was performed according to the departmental dose-optimization program which includes automated exposure control, adjustment of the mA and/or kV according to patient size and/or use of iterative reconstruction technique. COMPARISON:  10/08/2022 FINDINGS: Brain: Decreased density of intraparenchymal hematoma centered in the right parietal lobe with small amount of overlying subarachnoid hemorrhage. Unchanged layering blood in the lateral ventricles with unchanged communicating hydrocephalus. There is periventricular hypoattenuation compatible with chronic microvascular disease. No new site of hemorrhage. Near resolution of left-sided subarachnoid blood. Vascular: No abnormal hyperdensity of the major intracranial arteries or dural venous sinuses. No intracranial atherosclerosis. Skull: The visualized skull base, calvarium and extracranial soft  tissues are normal. Sinuses/Orbits: No fluid levels or advanced mucosal thickening of the visualized paranasal sinuses. No mastoid or middle ear effusion. The orbits are normal. IMPRESSION: 1. Decreased density of intraparenchymal hematoma centered in the right parietal lobe with small amount of overlying subarachnoid hemorrhage. 2. Unchanged layering blood in the lateral ventricles with unchanged communicating hydrocephalus. 3. Near resolution of left-sided subarachnoid blood. Electronically Signed   By: Deatra Robinson M.D.   On:  00:02    PHYSICAL EXAM  Temp:  [99.1 F (37.3 C)-103.4 F (39.7 C)] 100.5 F (38.1 C) (05/08 0858) Pulse Rate:  [73-148] 93 (05/08 0900) Resp:  [20-40] 40 (05/08 0900) BP: (126-192)/(54-140) 142/64 (05/08 0900) SpO2:  [83 %-100 %] 92 % (05/08 0900) Weight:  [75.4 kg] 75.4 kg (05/08 0500)  Physical Exam: General: critically ill elderly male, laying in bed. HENT: cortrak in place. CV: irregularly irregular rhythm. Pulm: coarse breath sounds bilaterally. Increased WOB with quick, shallow breathing.  Neuro: somnolent, not alert. Unable to elicit verbal response.  Right gaze preference.  Limited neurological exam given clinical condition. Given family has elected to pursue comfort measures, avoided applying noxious stimuli.   ASSESSMENT/PLAN Mario Smith is a 82 y.o. male with history of hypertension, hyperlipidemia, thrombocytopenia, Graves' disease with exophthalmos, Mnire's disease, hard of hearing at baseline uses hearing aids, cervical spine fusion, latent tuberculosis s/p treatment, COPD  presenting with ischemic stroke and secondary R cortical ICH secondary to TNK.   Stroke: right MCA infarct status post TNK, complicated by large parenchymal hemorrhagic infarct with IVH, stroke embolic pattern, likely due to new diagnosed A-fib Code Stroke:CT head with subtle loss of gray-white differentiation in R frontal lobe. Chronic small vessel disease.  Atrophy. Remote R parietal cortical infarct. ASPECTS 9.    CTA head & neck: No large vessel occlusion or proximal hemodynamically significant stenosis CT repeat for headache showed large parenchymal hematoma centered in the right frontal lobe, SAH seen in the right sylvian fissure.  Mass effect on right lateral ventricle CT repeat slight interval increase of right parenchymal hematoma and ICH, interval extension right > left IVH into lateral ventricles, enlargement of temporal and occipital horns, concerning for hydrocephalus.  Right posterior frontal infarct. CT repeat 5/2 stable parenchymal hematoma and IVH. CTH repeat 5/4: No evidence of new or additional bleeding.  IPH in the right temporal/parietal region as before, with slight clot evolution as expected.  Amount of IVH and SAH blood is not increased.  Ventricular size is not increased. MRI brain: Large intraparenchymal hematoma in the right cerebral hemisphere with intraventricular and subarachnoid extension of hemorrhage, similar to prior CT. Subacute infarct in the posterior right frontal lobe, corresponding to area of  hypodensity seen on prior CT. Small remote infarcts in the bilateral cerebellar hemispheres.   2D Echo: EF 60-65%  LDL 79 HgbA1c 5.4 Has been transitioned to comfort care and thus will avoid any aggressive interventions and/or medications not focused on comfort. Comfort measures are in place.  Cerebral edema CT showed large ICH with mass effect on the right ventricle but no significant midline shift On 3% saline @75  -> 50 ml/hr-> 25 mL/h --> off 5/6 S/p 3% bolus x 1 on 5/2 Na 140->144->146 -> 154 ->153-> 155 -> 154 > 156 > 158 > 151 > 150 Avoid regular sodium checks given now comfort focused care  IVH, risk of hydrocephalus CT head concerning for developing hydrocephalus Repeat CT IVH stable MRI showed stable IVH  Afib, new diagnosis Telemetry and EKG captured A-fib Possible cause of stroke this time Hold off AC now  due to hemorrhage and now comfort care  Hypertension Home meds:  Telmisartan 20mg , Carvedilol 6.25mg  BID Holding antihypertensives given comfort focused care  Hyperlipidemia Home meds:  Crestor 10mg  daily LDL 79, goal < 70 Holding statin given comfort focused care  Dysphagia with concern for ongoing aspiration with PO intake Passed swallow initially and was recommended dysphagia 1 and honey thick liquid NPO decision with cortrak placement for feeds and meds  Hypoxic respiratory failure requiring intubation  Likely secondary to aspiration pneumonia  Cortrak in place Unasyn stopped given comfort focused care  Other Stroke Risk Factors Advanced Age >/= 45   Other Active Problems Graves Dz status post left eye surgery causing ptosis, methimazole 5mg  twice weekly Right hand resting tremor, concerning for Parkinson disease.  Patient has no neurology following PTA.   Hospital day # 7  Overall, Mr. Bleakley has been transitioned to comfort focused care and orders have been updated to reflect this. Provided support to patient and family during this tough time. Will avoid any aggressive interventions and stop all medications that are not focused on comfort.   Mario Puma, MD [facility], PGY-3 , 11:35 AM I have personally obtained history,examined this patient, reviewed notes, independently viewed imaging studies, participated in medical decision making and plan of care.ROS completed by me personally and pertinent positives fully documented  I have made any additions or clarifications directly to the above note. Agree with note above.  Patient continues to have difficulty handling his secretions and likely is developing repeat aspiration and family is aware about his wishes of not requiring prolonged ventilatory support hence have agreed to comfort care measures only.  Long discussion with his wife and multiple family members at the bedside and answered questions.  Discussed with  critical care team.  We will transition him to full comfort care measures.This patient is critically ill and at significant risk of neurological worsening, death and care requires constant monitoring of vital signs, hemodynamics,respiratory and cardiac monitoring, extensive review of multiple databases, frequent neurological assessment, discussion with family, other specialists and medical decision making of high complexity.I have made any additions or clarifications directly to the above note.This critical care time does not reflect procedure time, or teaching time or supervisory time of PA/NP/Med Resident etc but could involve care discussion time.  I spent 30 minutes of neurocritical care time  in the care of  this patient.      Mario Heady, MD Medical Director [facility] Pager: [phone]  12:37 PM   To contact Stroke Continuity provider, please refer to [email or web address]. After hours, contact General Neurology

## 2022-11-05 NOTE — Progress Notes (Addendum)
eLink Physician-Brief Progress Note Patient Name: CELESTINE LAVIOLA DOB: August 21, 1940 MRN: [id number]   Date of Service  10/16/2022  HPI/Events of Note  82 year old male that initially presented with right MCA infarct with increased IPH.  Increased work of breathing and decreased level of consciousness a few days back, intubated, BAL and extubated.  He has been having progressively worsening labored breathing throughout the night and code stroke was called earlier but CT head was unremarkable.  eICU Interventions  Currently DNR, could introduce small degree of positive pressure ordered nasal trumpet.     4098 - I see the last IPAL note says "if he does well then we will send him to rehab but if he does not then we will keep him comfortable and will try to get him home with hospice to let him die peacefully where he would like to be."  It seems to be declining and would likely benefit most from comfort measures only.  Attempted to get a hold of patient's wife Corrie Dandy but there was no answer and message was left.  I think adding fentanyl for air hunger would be reasonable in this situation, the patient has an allergy to codeine.  Will need to readdress his goals of care given his worsening respiratory distress.    Intervention Category Intermediate Interventions: Respiratory distress - evaluation and management  Mikaelyn Arthurs 10/05/2022, 4:32 AM

## 2022-11-05 NOTE — Discharge Summary (Signed)
Patient ID: Mario Smith MRN: [id number] DOB/AGE: 1940-06-26 82 y.o.  Admit date: 10-18-2022 Death date: 10-25-2022  Admission Diagnoses: Left-sided weakness  Cause of Death: Respiratory failure due to aspiration pneumonia due to large right brain hemorrhage and patient made DNR and comfort care measures as per family wishes  Pertinent Medical Diagnosis: Principal Problem:   Acute CVA (cerebrovascular accident) (HCC) S/p IV TNK administration with hemorrhagic transformation Active Problems:   Cerebrovascular accident (CVA) (HCC)   Right-sided nontraumatic intracerebral hemorrhage (HCC)   Fever   Hypernatremia   Acute respiratory failure with hypoxia (HCC)   Aspiration pneumonia (HCC) Cytotoxic cerebral edema Intraventricular hemorrhage   Hospital Course: Mario Smith is a 82 y.o. male with a past medical history significant for hypertension, hyperlipidemia, thrombocytopenia, Graves' disease with exophthalmos, Mnire's disease, hard of hearing at baseline uses hearing aids, cervical spine fusion, latent tuberculosis s/p treatment, COPD.He had been in his usual state of health, did have a mechanical fall the day prior to admission without any significant injury while doing yard work.  Subsequently on the day of admission he called his wife and reported that he was not feeling well, he had felt fine just slightly earlier before calling her.  She came to check on him and found he was having left-sided weakness for which EMS was activated and he came to the hospital as a code stroke.  He was evaluated by video and TNK was administered.  Subsequently developed a severe headache and was found to have hemorrhagic conversion following TNK administration which was reversed with transexamic acid as well as cryoprecipitate (10 units, 2 pooled units of 5 each), follow-up CT scan showed slight increase in volume of right-sided parenchymal brain hemorrhage from 75 mL to 85 mL with unchanged ventricular  size.  Patient developed respiratory distress requiring intubation.  He was treated with antibiotics critical care team followed him throughout his hospital course.  He was treated with hypertonic saline.  Neurological exam improved and patient was extubated after course of antibiotics with IV Unasyn.  The family understood his poor prognosis and likelihood of repeat aspiration and developing pneumonia.  Family was quite clear patient did not want prolonged ventilatory support, tracheostomy nursing home care.  Patient again developed respiratory distress after extubation due to inability to protect his airway.  He was made full comfort care measures upon request from family and Comfortable to be peacefully passed away.  Signed: Delia Heady 10/13/2022, 2:40 PM

## 2022-11-05 NOTE — IPAL (Signed)
  Interdisciplinary Goals of Care Family Meeting   Date carried out: 10/15/2022  Location of the meeting: Bedside  Member's involved: Nurse Practitioner and Family Member or next of kin  Durable Power of Attorney or acting medical decision maker: Patient's wife Mario Smith and daughter Mario Smith are present    Discussion: We discussed goals of care for Mario Smith .  Ray is having significant respiratory distress. I suspect he has been aspirating since extubation. He would require intubation, however, family does not feel he would want to go back on life support considering large stroke and ICH, aspiration risk not likely to improve. Will proceed with comfort measures.   Code status:   Code Status: DNR   Disposition: In-patient comfort care  Time spent for the meeting: 30 minutes    Duayne Cal, NP  10/21/2022, 9:16 AM

## 2022-11-05 NOTE — Accreditation Note (Signed)
Restraint death in soft two point restraints within 24 hours of death logged on Nov 05, 2022 at 0849 by Laurene Footman RNBSN

## 2022-11-05 NOTE — Progress Notes (Signed)
@  2310 spoke with stroke MD about patient breathing pattern and RR in the 30's, patient not following commands or engaging with nurse or wife. Precedex gtt was turned off. MD to see patient and STAT head CT ordered. Head CT done and patient work of breathing still labored, however has dropped into the 20's at this time.

## 2022-11-05 DEATH — deceased

## 2022-11-07 NOTE — Telephone Encounter (Signed)
Spoke Dr Pearlean Brownie today in regard to the pt death certificate. He states that typically he receives an email that notifies him to go in and sign off death certificate. He states that he never received anything.  He had to go in and create a new one in order to sign off on the pt death certificate. As of today, Dr Pearlean Brownie states this has been taken care of.

## 2022-11-23 ENCOUNTER — Telehealth: Payer: Self-pay | Admitting: Family Medicine

## 2022-11-23 NOTE — Telephone Encounter (Signed)
Placed letterhead to confirm patient's location of death

## 2022-12-23 ENCOUNTER — Ambulatory Visit: Payer: No Typology Code available for payment source | Admitting: Family Medicine

## 2023-02-14 ENCOUNTER — Telehealth: Payer: Self-pay | Admitting: Family Medicine

## 2023-02-14 NOTE — Telephone Encounter (Signed)
Pts wife called stating that we were supposed to send medical records to pts insurance company ([employer]). Waiting on Dr Dettinger to sign off. Has this been done? Paperwork was sent to [place] over a week ago is what wife is saying.

## 2023-02-15 NOTE — Telephone Encounter (Signed)
I did not see it on my desk, we should check around for it or maybe I have already signed it.  I do not recall signing it but it is not on my desk.

## 2023-02-16 NOTE — Telephone Encounter (Signed)
Spoke with pts wife regarding this. She is going to call the insurance agent and request for them to send [place] the paperwork again. Will call us with an update once she has one.

## 2023-02-16 NOTE — Telephone Encounter (Signed)
Okay we will watch for the paperwork

## 2023-02-16 NOTE — Telephone Encounter (Signed)
Received call from agent, Mario Smith regarding this paperwork request. Mario Smith says that pt had a hospital indemnity plan and its under insurance review. Says the insurance ([employer]) sent some paperwork to [employer] on 02/07/23 for [place] to have pts PCP fill out and send back.  Mario Smith is going to see if he can get the insurance to fax the paperwork directly to our office fax #  Can call Mario Smith with any questions. [phone]
